# Patient Record
Sex: Male | Born: 1965
Health system: Southern US, Academic
[De-identification: ages and names within clinical notes are randomized; demographics above are authoritative.]

## PROBLEM LIST (undated history)

## (undated) ENCOUNTER — Encounter

## (undated) ENCOUNTER — Telehealth

## (undated) ENCOUNTER — Ambulatory Visit

## (undated) ENCOUNTER — Encounter: Attending: Pharmacist | Primary: Pharmacist

## (undated) ENCOUNTER — Encounter: Attending: Internal Medicine | Primary: Internal Medicine

## (undated) ENCOUNTER — Ambulatory Visit: Payer: MEDICARE

## (undated) ENCOUNTER — Telehealth: Attending: Internal Medicine | Primary: Internal Medicine

## (undated) ENCOUNTER — Ambulatory Visit: Payer: Medicare (Managed Care)

## (undated) ENCOUNTER — Encounter: Attending: Nurse Practitioner | Primary: Nurse Practitioner

## (undated) ENCOUNTER — Telehealth: Attending: Pharmacist | Primary: Pharmacist

## (undated) ENCOUNTER — Ambulatory Visit: Payer: MEDICARE | Attending: Internal Medicine | Primary: Internal Medicine

## (undated) ENCOUNTER — Encounter: Payer: PRIVATE HEALTH INSURANCE | Attending: Internal Medicine | Primary: Internal Medicine

## (undated) ENCOUNTER — Encounter: Attending: Mental Health | Primary: Mental Health

## (undated) ENCOUNTER — Encounter: Attending: Registered" | Primary: Registered"

## (undated) ENCOUNTER — Encounter
Attending: Student in an Organized Health Care Education/Training Program | Primary: Student in an Organized Health Care Education/Training Program

## (undated) ENCOUNTER — Ambulatory Visit: Payer: Medicare (Managed Care) | Attending: Pulmonary Disease | Primary: Pulmonary Disease

## (undated) ENCOUNTER — Telehealth: Attending: Pulmonary Disease | Primary: Pulmonary Disease

## (undated) ENCOUNTER — Ambulatory Visit: Attending: Pharmacist | Primary: Pharmacist

## (undated) ENCOUNTER — Encounter: Attending: Pulmonary Disease | Primary: Pulmonary Disease

## (undated) ENCOUNTER — Ambulatory Visit: Payer: PRIVATE HEALTH INSURANCE

## (undated) ENCOUNTER — Ambulatory Visit: Attending: Nurse Practitioner | Primary: Nurse Practitioner

## (undated) ENCOUNTER — Ambulatory Visit
Payer: PRIVATE HEALTH INSURANCE | Attending: Student in an Organized Health Care Education/Training Program | Primary: Student in an Organized Health Care Education/Training Program

## (undated) ENCOUNTER — Ambulatory Visit: Payer: MEDICARE | Attending: Pharmacist | Primary: Pharmacist

## (undated) ENCOUNTER — Ambulatory Visit: Attending: Addiction (Substance Use Disorder) | Primary: Addiction (Substance Use Disorder)

## (undated) ENCOUNTER — Ambulatory Visit: Attending: Clinical | Primary: Clinical

## (undated) ENCOUNTER — Ambulatory Visit: Payer: PRIVATE HEALTH INSURANCE | Attending: Internal Medicine | Primary: Internal Medicine

## (undated) ENCOUNTER — Non-Acute Institutional Stay: Payer: PRIVATE HEALTH INSURANCE

## (undated) ENCOUNTER — Encounter
Payer: PRIVATE HEALTH INSURANCE | Attending: Student in an Organized Health Care Education/Training Program | Primary: Student in an Organized Health Care Education/Training Program

## (undated) ENCOUNTER — Ambulatory Visit: Payer: MEDICARE | Attending: Retina Specialist | Primary: Retina Specialist

## (undated) ENCOUNTER — Ambulatory Visit: Payer: MEDICARE | Attending: Pulmonary Disease | Primary: Pulmonary Disease

## (undated) ENCOUNTER — Ambulatory Visit: Payer: PRIVATE HEALTH INSURANCE | Attending: Retina Specialist | Primary: Retina Specialist

## (undated) DIAGNOSIS — R51 Headache: Secondary | ICD-10-CM

## (undated) DIAGNOSIS — G8929 Other chronic pain: Secondary | ICD-10-CM

## (undated) DIAGNOSIS — G4733 Obstructive sleep apnea (adult) (pediatric): Secondary | ICD-10-CM

## (undated) DIAGNOSIS — R519 Headache, unspecified: Secondary | ICD-10-CM

## (undated) DIAGNOSIS — E785 Hyperlipidemia, unspecified: Secondary | ICD-10-CM

## (undated) DIAGNOSIS — E8801 Alpha-1-antitrypsin deficiency: Secondary | ICD-10-CM

## (undated) DIAGNOSIS — B450 Pulmonary cryptococcosis: Secondary | ICD-10-CM

## (undated) HISTORY — DX: Headache: R51

## (undated) HISTORY — DX: Alpha-1-antitrypsin deficiency: E88.01

## (undated) HISTORY — DX: Headache, unspecified: R51.9

## (undated) HISTORY — DX: Other chronic pain: G89.29

## (undated) HISTORY — DX: Pulmonary cryptococcosis: B45.0

## (undated) HISTORY — DX: Hyperlipidemia, unspecified: E78.5

## (undated) HISTORY — DX: Obstructive sleep apnea (adult) (pediatric): G47.33

## (undated) MED ORDER — CULTURELLE PROBIOTICS ORAL
Freq: Every day | ORAL | 0.00000 days | Status: SS
Start: ? — End: 2020-12-13

## (undated) NOTE — ED Provider Notes (Signed)
 Formatting of this note is different from the original.  eMERGENCY dEPARTMENT eNCOUnter    CHIEF COMPLAINT   Chief Complaint  Patient presents with  ? Wrist Pain    patient complains of right wrist pain that started 3-4 weeks ago; states that he is unsure of a direct injury and states that it gets better and then returns    HPI   Jake CRASS Sr. is a 75 y.o. male who presents right wrist pain started 3-4 weeks ago states that he injured it states that he has had swelling has not improved.  Denies any other injuries.  Denies any fevers or chills no rashes.  PAST MEDICAL HISTORY   Past Medical History:  Diagnosis Date  ? Alpha-1-antitrypsin deficiency (HCC)   ? Asthma   ? Bronchiectasis (HCC)   ? Diabetes mellitus (HCC)   ? GERD (gastroesophageal reflux disease)   ? Hyperlipidemia   ? Migraine   ? Mycobacterium avium-intracellulare infection (HCC)   ? OSA (obstructive sleep apnea)   ? Reactive airway disease   ? Vitamin D deficiency    SURGICAL HISTORY   History reviewed. No pertinent surgical history.  CURRENT MEDICATIONS   No current facility-administered medications for this encounter.   Current Outpatient Medications:  ?  albuterol  (PROVENTIL ) 2.5 mg /3 mL (0.083 %) nebulizer solution, Take 2.5 mg by nebulization every 6 (six) hours as needed for Wheezing., Disp: , Rfl:  ?  atorvastatin (LIPITOR) 20 MG tablet, Take 20 mg by mouth daily., Disp: , Rfl:  ?  cetirizine  (ZYRTEC ) 10 MG tablet, Take 10 mg by mouth daily., Disp: , Rfl:  ?  dextrose 5 % SolP 50 mL with colistimethate 150 mg SolR, Inject into the vein every 12 (twelve) hours., Disp: , Rfl:  ?  fexofenadine  (ALLEGRA ) 180 MG tablet, Take 180 mg by mouth daily., Disp: , Rfl:  ?  fluticasone  propion-salmeterol (ADVAIR) 100-50 mcg/dose diskus inhaler, Inhale 1 puff into the lungs every 12 (twelve) hours., Disp: , Rfl:  ?  fluticasone  propionate (FLONASE ) 50 mcg/actuation nasal spray, 1 spray by Nasal route daily.Spray  in each nostril., Disp: , Rfl:  ?  metFORMIN (GLUCOPHAGE) 500 MG tablet, Take 500 mg by mouth 2 (two) times daily with meals., Disp: , Rfl:  ?  montelukast  (SINGULAIR ) 10 mg tablet, Take 10 mg by mouth nightly., Disp: , Rfl:  ?  omeprazole (PRILOSEC) 20 MG capsule, Take 20 mg by mouth every morning at 6:30AM (before breakfast)., Disp: , Rfl:  ?  rizatriptan (MAXALT) 10 MG tablet, Take 10 mg by mouth as needed for Migraine.May repeat in 2 hours if unresolved. Do not exceed 30 mg in 24 hours., Disp: , Rfl:  ?  topiramate (TOPAMAX) 100 MG tablet, Take 100 mg by mouth 2 (two) times daily., Disp: , Rfl:   ALLERGIES   Allergies  Allergen Reactions  ? Codeine Palpitations    And rash   ? Daliresp [Roflumilast] Myalgia    Back pain   FAMILY HISTORY   History reviewed. No pertinent family history.  SOCIAL HISTORY   Social History   Socioeconomic History  ? Marital status: Married    Spouse name: None  ? Number of children: None  ? Years of education: None  ? Highest education level: None  Occupational History  ? None  Social Needs  ? Financial resource strain: None  ? Food insecurity:    Worry: None    Inability: None  ? Transportation needs:    Medical:  None    Non-medical: None  Tobacco Use  ? Smoking status: Former Smoker  ? Smokeless tobacco: Never Used  Substance and Sexual Activity  ? Alcohol use: Not Currently  ? Drug use: Never  ? Sexual activity: None  Lifestyle  ? Physical activity:    Days per week: None    Minutes per session: None  ? Stress: None  Relationships  ? Social connections:    Talks on phone: None    Gets together: None    Attends religious service: None    Active member of club or organization: None    Attends meetings of clubs or organizations: None    Relationship status: None  ? Intimate partner violence:    Fear of current or ex partner: None    Emotionally abused: None    Physically abused: None    Forced sexual activity: None  Other  Topics Concern  ? None  Social History Narrative  ? None   REVIEW OF SYSTEMS   All other systems reviewed and are negative  PHYSICAL EXAM   VITAL SIGNS: BP 130/90   Pulse 85   Temp 98.4 F (36.9 C) (Oral)   Resp 16   Ht 5' 6 (1.676 m)   Wt 191 lb (86.6 kg)   SpO2 96%   BMI 30.83 kg/m  Constitutional:  Well developed, well nourished, no acute distress HENT:  Normocephalic, atraumatic.  Normal external inspection. Extremities:  Tenderness palpation over the dorsum of the right wrist and distal forearm mild swelling noted median ulnar radial nerve motor and sensory distributions intact good distal radial pulse 2 +cap refill less than 2 seconds distally no rashes or lesions present Back: No midline or CVA tenderness.  Skin:  No rashes or lesions present Neurologic:  Awake and alert.  GCS 15.  Normal gross cognition and speech.  5/5 strength and normal sensation to light touch.  RADIOLOGY   X-ray wrist right PA lateral and oblique Narrative: THREE OR MORE VIEWS RIGHT WRIST:  COMPARISON: No comparison.  FINDINGS:  No evidence of acute fracture or malalignment.  No significant degenerative change.   No radiopaque foreign body projecting over the soft tissues. Impression: IMPRESSION:  NO EVIDENCE OF ACUTE FRACTURE. IN THE SETTING OF TRAUMA IF THERE IS PERSISTENT CLINICAL CONCERN IN PARTICULAR FOR POSSIBLE SCAPHOID FRACTURE, RECOMMEND REPEAT RADIOGRAPHS IN 7-10 DAYS.  Dictated By: Norleen KATHEE Ottoville III 08/18/2018 10:16  Electronically Signed by: Norleen KATHEE Callender III 08/18/2018 10:17   ED COURSE & MEDICAL DECISION MAKING   Pertinent labs & imaging studies reviewed. (See chart for details) 28 year old male presents with right wrist forearm injury that occurred 3 weeks ago suspect possible ligamentous injury.  X-ray was negative patient neurologically intact here no vascular compromise.  Patient will be referred to Orthopedics and continue wrist brace as needed.  Ice and elevation.   Motrin.  FINAL IMPRESSION   Final diagnoses:  Right wrist injury, initial encounter   Portions of this note may be dictated using Writer. Variances in spelling and vocabulary are possible and unintentional; not all errors are caught/corrected. Please notify dino if any discrepancies are noted or if the meaning of any statement is not clear.    Emmitt JINNY Dixons, MD 08/18/18 1025  Electronically signed by Emmitt JINNY Dixons, MD at 08/18/2018 10:25 AM EDT

---
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
## 2009-11-26 ENCOUNTER — Ambulatory Visit: Payer: Self-pay | Admitting: Family Medicine

## 2010-01-07 ENCOUNTER — Ambulatory Visit: Payer: Self-pay | Admitting: Internal Medicine

## 2010-01-10 ENCOUNTER — Ambulatory Visit: Payer: Self-pay | Admitting: Internal Medicine

## 2010-01-28 LAB — PULMONARY FUNCTION TEST

## 2010-02-04 LAB — PULMONARY FUNCTION TEST

## 2010-02-10 ENCOUNTER — Ambulatory Visit: Payer: Self-pay | Admitting: Internal Medicine

## 2010-03-02 HISTORY — PX: SHOULDER SURGERY: SHX246

## 2010-05-15 ENCOUNTER — Ambulatory Visit: Payer: Self-pay | Admitting: Unknown Physician Specialty

## 2010-05-22 ENCOUNTER — Ambulatory Visit: Payer: Self-pay | Admitting: Unknown Physician Specialty

## 2011-05-08 IMAGING — CT NM PET TUM IMG LTD AREA
5 series · 25 of 25 positions shown · non-contrast
Comparison: none

REASON FOR EXAM: Abn Ct  Diffuse scattered pulmonary opacities  ill
defined  soft tissue nodu...
COMMENTS:

[Series 3: ct wb 3.0 b30f · axial · 3.0mm · 0.98mm/px · z∈[-1379,-511]mm · 10 of 435 slices shown]
[im 1/435  soft-tissue]
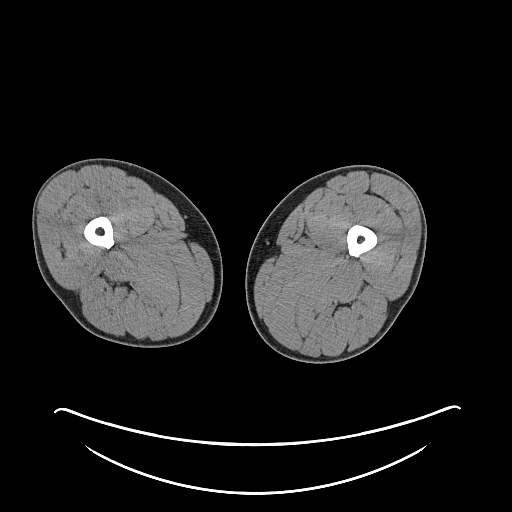
[im 49/435  soft-tissue]
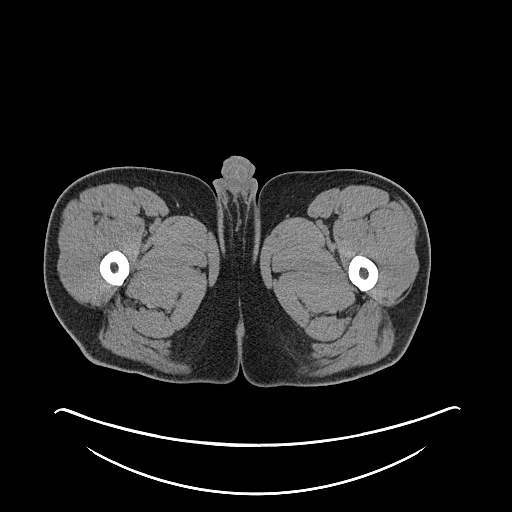
[im 97/435  soft-tissue]
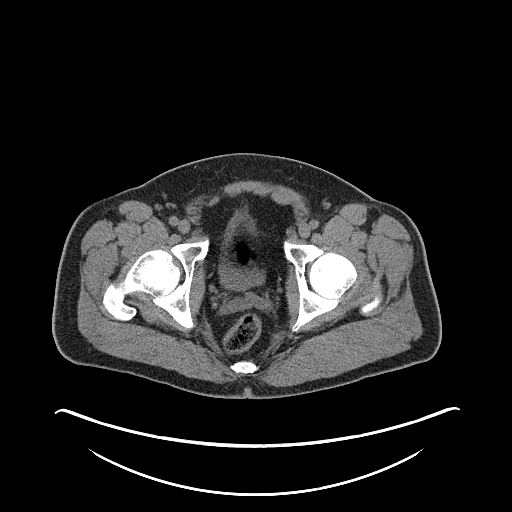
[im 145/435  soft-tissue]
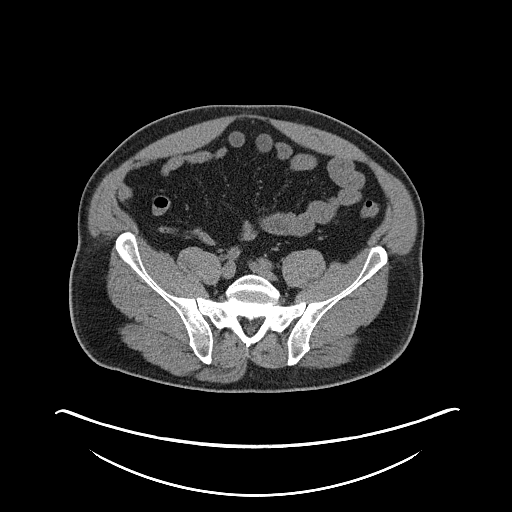
[im 193/435  soft-tissue]
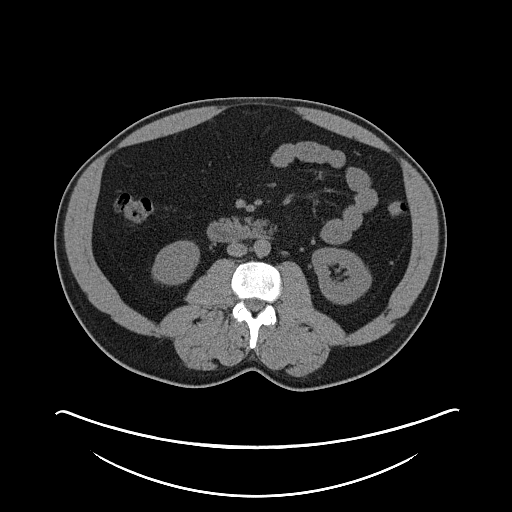
[im 242/435  soft-tissue]
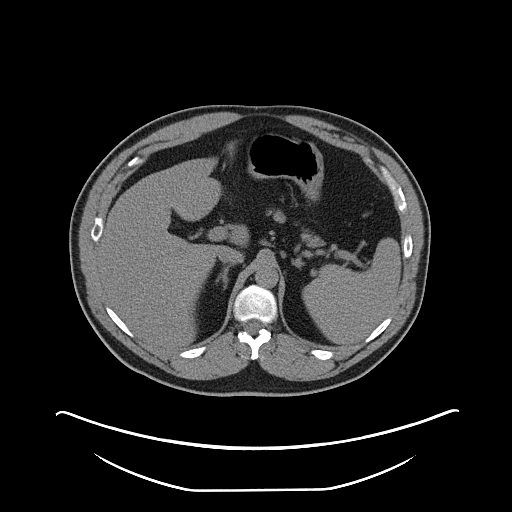
[im 290/435  soft-tissue]
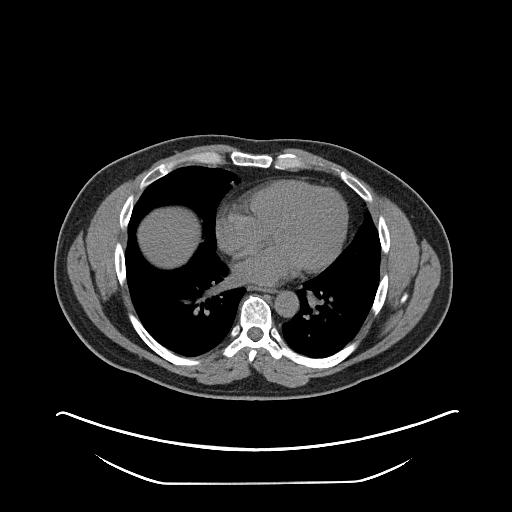
[im 338/435  soft-tissue]
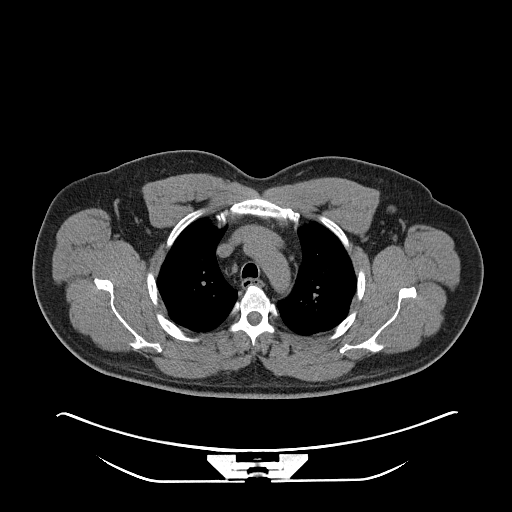
[im 386/435  soft-tissue]
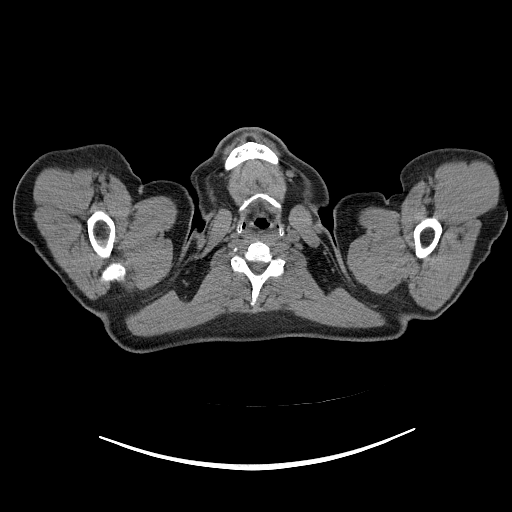
[im 435/435  soft-tissue]
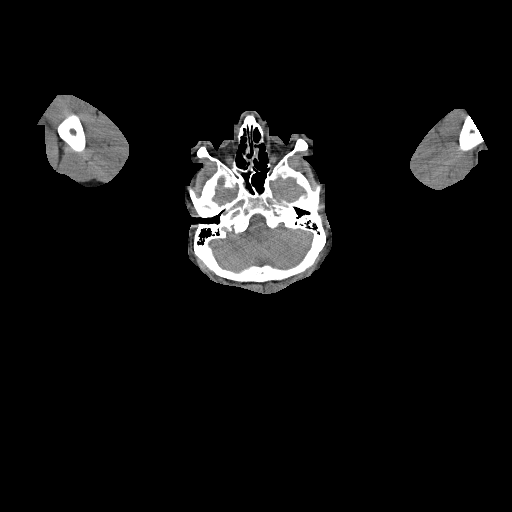

[Series 102: pet wb · axial · 5.0mm · 4.07mm/px · z∈[-1378,-511]mm · 7 of 290 slices shown]
[im 1/290]
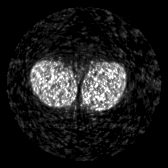
[im 49/290]
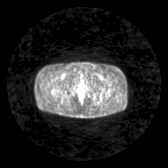
[im 97/290]
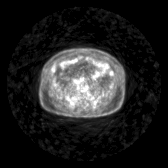
[im 145/290]
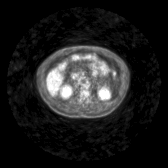
[im 193/290]
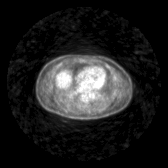
[im 241/290]
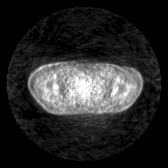
[im 290/290]
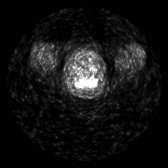

[Series 803: pet axial · 4 of 170 slices shown]
[im 1/170]
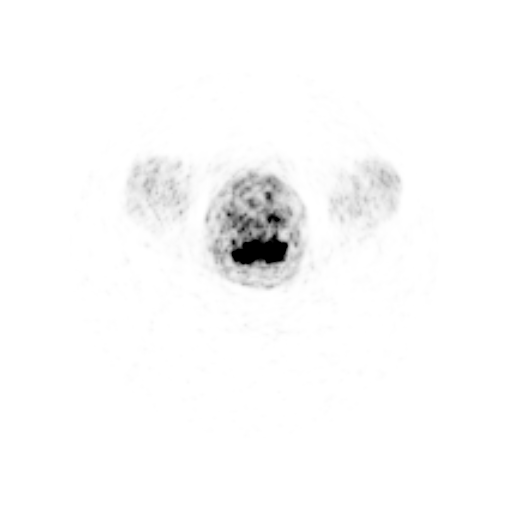
[im 57/170]
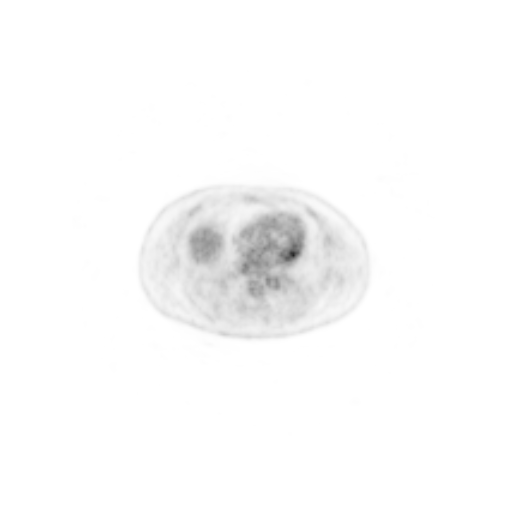
[im 113/170]
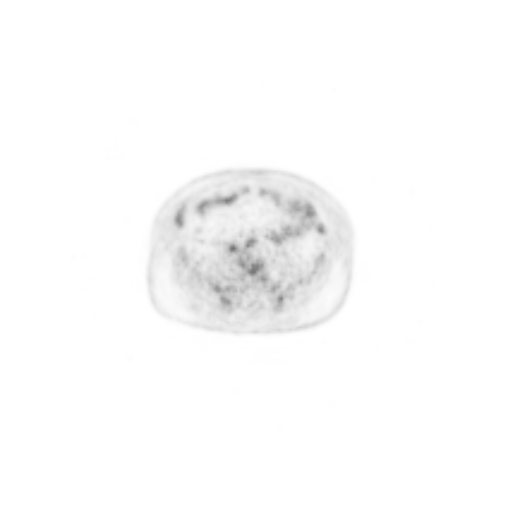
[im 170/170]
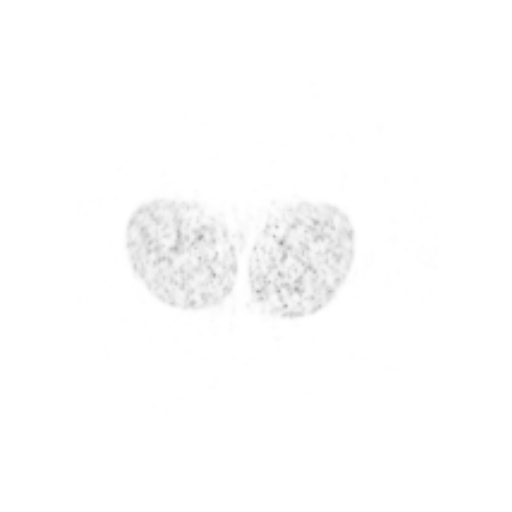

[Series 804: pet coronal · 2 of 65 slices shown]
[im 1/65]
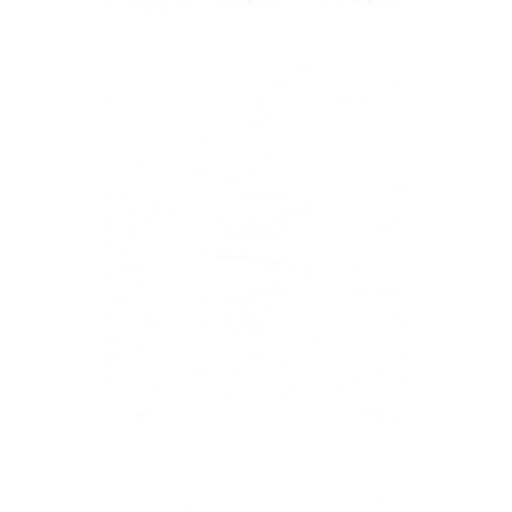
[im 65/65]
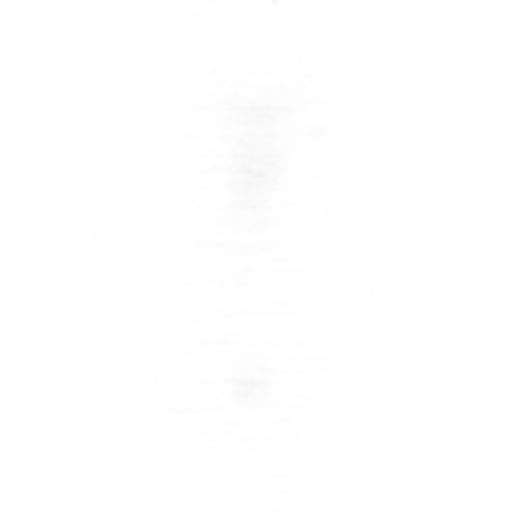

[Series 805: pet sagittal · 2 of 92 slices shown]
[im 1/92]
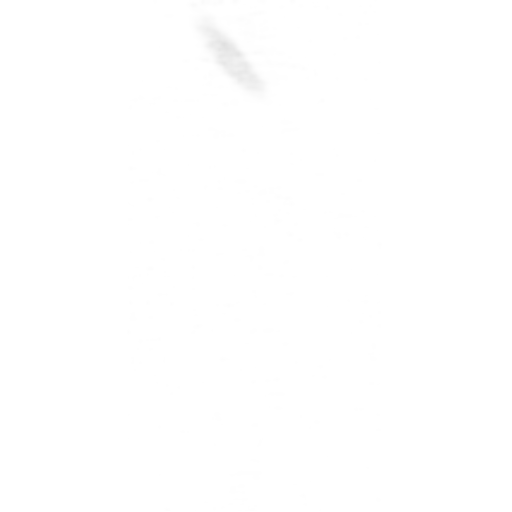
[im 92/92]
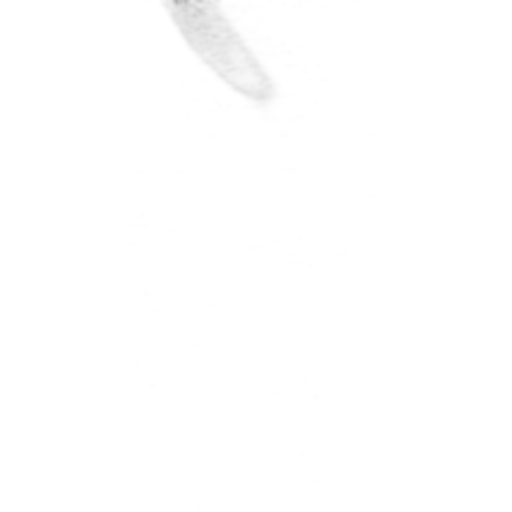

[25 of 25 positions shown; findings below may reference images not displayed]

PROCEDURE:     PET - PET/CT DX LUNG CA  - February 10, 2010  [DATE]

RESULT:     The patient's fasting blood glucose level is measured at 95
mg/dl. The patient received an injection of 13.30 mCi of fluorine-55 labeled
fluorodeoxyglucose in the right antecubital region at [DATE] p.m. with
imaging obtained between the hours of [DATE] p.m. and [DATE] p.m. extending from
the base of the brain into the thighs. Non-contrast CT is performed over the
same regions for the purposes of attenuation, correction and fusion. The
non-contrast CT images, attenuation corrected PET images and fused PET/CT
data sets are reconstructed by the [employer] software in the axial, coronal
and sagittal planes. There is no previous similar study for comparison.

There appears to be a mucous retention cyst in the floor of the left
maxillary sinus. There is no abnormal localization of F-18 FDG to suggest
F-18 FDG avid malignancy. No discrete pulmonary mass is seen. There is some
dependent atelectasis.
IMPRESSION: No F-18 FDG avid malignancy evident. Given the findings on
the previous diagnostic chest CT, follow-up chest CT would be suggested in 3
to 6 months.

## 2011-06-12 LAB — PULMONARY FUNCTION TEST

## 2011-08-03 ENCOUNTER — Institutional Professional Consult (permissible substitution): Payer: Self-pay | Admitting: Emergency Medicine

## 2011-08-10 ENCOUNTER — Ambulatory Visit (INDEPENDENT_AMBULATORY_CARE_PROVIDER_SITE_OTHER): Payer: Self-pay | Admitting: Pulmonary Disease

## 2011-08-10 ENCOUNTER — Encounter: Payer: Self-pay | Admitting: Pulmonary Disease

## 2011-08-10 DIAGNOSIS — J479 Bronchiectasis, uncomplicated: Secondary | ICD-10-CM

## 2011-08-10 DIAGNOSIS — G4733 Obstructive sleep apnea (adult) (pediatric): Secondary | ICD-10-CM

## 2011-08-10 DIAGNOSIS — E8801 Alpha-1-antitrypsin deficiency: Secondary | ICD-10-CM | POA: Insufficient documentation

## 2011-08-10 NOTE — Patient Instructions (Signed)
Return to clinic in 6 weeks. We will obtain records from [facility].

## 2011-08-10 NOTE — Progress Notes (Signed)
Subjective:    Patient ID: Jake Martinez, male    DOB: 02/01/1966, 45 y.o.   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  He states that he was healthy as a child and had very few respiratory infections.  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  Approximately two years ago his symptoms recurred, primarily continuous daily cough productive of copious amounts of sputum.  He underwent evaluation for this by Dr. Welton Flakes here in [place] and was found to have nodules in his lungs and obstructive sleep apnea.  He was put on CPAP therapy which he uses regularly.  He states that after his initial evaluation in pulmonary medicine he was asked to change his work environment, but unfortunately he was terminated by his company at this request.  Since then he has been evaluated extensively by [facility] and says that he has been diagnosed with alpha 1 anti-trypsin deficiency and bronchiectasis.  He has not required hospitalization for this, but he has been given Dulera, hypertonic saline, and albuterol.  He says that the [facility] helps his shortness of breath somewhat. He states that initially in the illness he did not have dyspnea, but this symptom has slowly progressed over the last few years.  He is scheduled to undergo what sounds like an esophageal impedence study or pH probe at [facility] on 09/02/2011.    Review of Systems  Constitutional: Negative for fever, chills, activity change, appetite change and unexpected weight change.  HENT: Positive for congestion. Negative for sore throat, rhinorrhea, sneezing, trouble swallowing, dental problem, voice change and postnasal drip.   Eyes: Negative for visual  disturbance.  Respiratory: Positive for cough and shortness of breath. Negative for choking.   Cardiovascular: Positive for chest pain. Negative for leg swelling.  Gastrointestinal: Negative for nausea, vomiting and abdominal pain.  Genitourinary: Negative for difficulty urinating.  Musculoskeletal: Negative for arthralgias.  Skin: Negative for rash.  Psychiatric/Behavioral: Negative for behavioral problems and confusion.       Objective:   Physical Exam Gen: well appearing, no acute distress HEENT: NCAT, PERRL, EOMi, OP clear, neck supple without masses PULM: Insp crackles worse in bases, L>R GEN: RRR, no mgr, no JVD AB: BS+, soft, nontender, no hsm Ext: warm, no edema, no clubbing, no cyanosis Derm: no rash or skin breakdown Neuro: A&Ox4, CN II-XII intact, strength 5/5 in all 4 extremeties       Assessment & Plan:   Impression: 1) Bronchiectasis 2) Alpha-1-anti-trypsin deficiency  A&P:  45 y/o male with a diagnosis of bronchiectasis and alpha-1-antitrypsin deficiency presents to our clinic for a second opinion on his diagnosis and management.  It sounds as if he has had an extensive work up both here in [place] by Dr. Welton Flakes and in [place] by [facility].  Alpha 1 anti-trypsin deficiency can be associated with bronchiectasis so there are no obvious inconsistencies in the history he provides to compel me to order more tests.  At this point, I see no indication to change his current therapy as it seems appropriate for his given diagnoses.  We will request records from [facility] from clinic visits, lab work, and radiology images to better asses his case.  We will have him return in  6 weeks after his esophageal study and will re-assess his situation at that point.  In the meantime I have recommended that he continue on his current CPAP therapy and medication regimen which was reconciled today.

## 2011-08-19 ENCOUNTER — Telehealth: Payer: Self-pay | Admitting: Pulmonary Disease

## 2011-08-19 NOTE — Telephone Encounter (Signed)
Received copies from [facility] 08/19/2011. Forwarded  35pages to Dr. Elba Barman review.

## 2011-08-24 ENCOUNTER — Telehealth: Payer: Self-pay | Admitting: Pulmonary Disease

## 2011-08-24 NOTE — Telephone Encounter (Signed)
I spoke with Jake Martinez and advised her per Verlon Au she did receive records from [facility]. Nothing further was needed

## 2011-08-27 ENCOUNTER — Telehealth: Payer: Self-pay | Admitting: Pulmonary Disease

## 2011-08-27 NOTE — Telephone Encounter (Signed)
Spoke with pt. He states that his original appt was scheduled for 09/28/11 and that his spouse had called and rescheduled this sooner due to increased cough. He states that he feels okay with waiting until after PH study done to be seen, but since he is worsening I will forward msg to Dr Kendrick Fries for recs. Please advise if you want to have him keep his ov with 10/29 or have his reschedule for later that wk, thanks!

## 2011-08-27 NOTE — Telephone Encounter (Signed)
Pt is scheduled to see Dr Kendrick Fries in [facility] on 08/31/11, however we had planned his return to be after had esophageal PH study done at [facility]. This was to be done on 09/01/11. I have call the pt to see if he can resched, or if maybe he was able to have the study done sooner and that way he can just keep ov. Will await a call back.

## 2011-08-28 NOTE — Telephone Encounter (Signed)
Spoke with pt. He states that today his cough is no worse than when last seen by [place]. He states that he feels comfortable waiting until after PH study done and then will see [place] in 09/04/11 at 3:30. He denies any fever, SOB, CP or other complaints. I advised that if cough gets worse and other symptoms arise to call us for sooner ov or seek emergency care if needed. Pt verbalized understanding and states that he will bring results of PH study to appt.

## 2011-08-30 NOTE — Telephone Encounter (Signed)
Jake Martinez,  If it would help tell [name] that I can call him on 10/29 (to help with the cough) in the afternoon then he can come in to the office later in the week. Thanks, Kipp Brood

## 2011-08-30 NOTE — Telephone Encounter (Signed)
If he wants to keep the 10/29 appointment for cough that is fine, though I would prefer to see him on Nov 1 or Nov 2 as he will have the pH probe on 10/31.    He doesn't have insurance, so minimizing his appointments is probably best for him financially... So it would really make the most sense for him to come on Nov 1 or 2.

## 2011-08-31 ENCOUNTER — Ambulatory Visit: Payer: Self-pay | Admitting: Pulmonary Disease

## 2011-09-01 ENCOUNTER — Institutional Professional Consult (permissible substitution): Payer: Self-pay | Admitting: Emergency Medicine

## 2011-09-04 ENCOUNTER — Ambulatory Visit: Payer: Self-pay | Admitting: Pulmonary Disease

## 2011-09-15 LAB — PULMONARY FUNCTION TEST

## 2011-09-23 ENCOUNTER — Emergency Department (HOSPITAL_COMMUNITY)
Admission: EM | Admit: 2011-09-23 | Discharge: 2011-09-24 | Disposition: A | Discharge status: Home / Self Care | Payer: Self-pay | Attending: Emergency Medicine | Admitting: Emergency Medicine

## 2011-09-23 ENCOUNTER — Emergency Department (HOSPITAL_COMMUNITY): Payer: Self-pay

## 2011-09-23 ENCOUNTER — Encounter (HOSPITAL_COMMUNITY): Payer: Self-pay | Admitting: Emergency Medicine

## 2011-09-23 ENCOUNTER — Other Ambulatory Visit: Payer: Self-pay

## 2011-09-23 DIAGNOSIS — Z79899 Other long term (current) drug therapy: Secondary | ICD-10-CM | POA: Insufficient documentation

## 2011-09-23 DIAGNOSIS — M79603 Pain in arm, unspecified: Secondary | ICD-10-CM

## 2011-09-23 DIAGNOSIS — G4733 Obstructive sleep apnea (adult) (pediatric): Secondary | ICD-10-CM | POA: Insufficient documentation

## 2011-09-23 DIAGNOSIS — R079 Chest pain, unspecified: Secondary | ICD-10-CM | POA: Insufficient documentation

## 2011-09-23 DIAGNOSIS — E785 Hyperlipidemia, unspecified: Secondary | ICD-10-CM | POA: Insufficient documentation

## 2011-09-23 DIAGNOSIS — M79609 Pain in unspecified limb: Secondary | ICD-10-CM | POA: Insufficient documentation

## 2011-09-23 DIAGNOSIS — M542 Cervicalgia: Secondary | ICD-10-CM | POA: Insufficient documentation

## 2011-09-23 DIAGNOSIS — M25519 Pain in unspecified shoulder: Secondary | ICD-10-CM | POA: Insufficient documentation

## 2011-09-23 LAB — CBC
HCT: 45.6 % (ref 39.0–52.0)
MCV: 86.7 fL (ref 78.0–100.0)
RBC: 5.26 MIL/uL (ref 4.22–5.81)
WBC: 9.6 10*3/uL (ref 4.0–10.5)

## 2011-09-23 LAB — BASIC METABOLIC PANEL
BUN: 17 mg/dL (ref 6–23)
CO2: 25 mEq/L (ref 19–32)
Chloride: 103 mEq/L (ref 96–112)
Creatinine, Ser: 0.9 mg/dL (ref 0.50–1.35)

## 2011-09-23 LAB — POCT I-STAT TROPONIN I: Troponin i, poc: 0 ng/mL (ref 0.00–0.08)

## 2011-09-23 NOTE — ED Notes (Signed)
PT. REPORTS LEFT CHEST PAIN RADIATING TO LEFT SHOULDER AND LEFT ARM ONSET THIS MORNING ,  NO SOB , NO NAUSEA OR DIAPHORESIS.  CURRENTLY TAKING ANTIBIOTICS FOR "LUNG INFECTION".

## 2011-09-24 ENCOUNTER — Other Ambulatory Visit: Payer: Self-pay

## 2011-09-24 ENCOUNTER — Encounter (HOSPITAL_COMMUNITY): Payer: Self-pay | Admitting: *Deleted

## 2011-09-24 LAB — POCT I-STAT TROPONIN I: Troponin i, poc: 0 ng/mL (ref 0.00–0.08)

## 2011-09-24 NOTE — ED Notes (Signed)
Peter, PA at bedside.

## 2011-09-24 NOTE — ED Notes (Signed)
Received bedside report from Jake Martinez, [place].  Patient currently sitting up in bed; no respiratory or acute distress noted.  Lab at bedside collecting Troponin.  Patient has no questions or concerns at this time.  Will continue to monitor.

## 2011-09-24 NOTE — ED Notes (Signed)
Patient given copy of discharge papers.  Went over discharge instructions with patient.  Instructed patient to follow up with primary care physicians and to take ibuprofen for Tylenol for pain.  Patient instructed to return to the ED for new, concerning, or worsening symptoms.  Will continue to monitor.

## 2011-09-24 NOTE — ED Provider Notes (Signed)
History     CSN: [id number] Arrival date & time: 09/23/2011  9:38 PM   First MD Initiated Contact with Patient 09/24/11 0151      Chief Complaint  Patient presents with  . Chest Pain    HPI  History provided by the patient and family. Patient presents with complaints of left arm and shoulder pains radiating to left neck that began earlier yesterday morning at around 10 AM. Patient states he first noticed pain in his arm when he lifted it on the top of the steering wheel while he was driving. Pain seemed to be better when he lowered the arm. Since that time pain has been more persistent and seems worse with activity. Pt also reports that the pain seems to have sharp burst lasting 30 seconds in the area. Patient denies pain over his chest to me but does report some tightness.  Pt denies diaphoresis, nausea or vomiting.   Past Medical History  Diagnosis Date  . Alpha-1-antitrypsin deficiency   . Bronchiectasis   . Hyperlipidemia   . Chronic headaches   . OSA (obstructive sleep apnea)     Past Surgical History  Procedure Date  . Shoulder surgery 03/02/10    Family History  Problem Relation Age of Onset  . Alpha-1 antitrypsin deficiency      ? which parent   . Emphysema Maternal Uncle     smoker  . Colon cancer Father   . Heart disease Maternal Uncle     History  Substance Use Topics  . Smoking status: Never Smoker   . Smokeless tobacco: Former Neurosurgeon    Quit date: 11/02/1997  . Alcohol Use: No      Review of Systems  Constitutional: Negative for fever and chills.  Respiratory: Negative for cough.   Cardiovascular: Positive for chest pain. Negative for palpitations.  Gastrointestinal: Negative for nausea, vomiting, abdominal pain, diarrhea and constipation.  Skin: Negative for rash.  All other systems reviewed and are negative.    Allergies  Codeine  Home Medications   Current Outpatient Rx  Name Route Sig Dispense Refill  . ALBUTEROL SULFATE HFA 108 (90  BASE) MCG/ACT IN AERS Inhalation Inhale 2 puffs into the lungs every 6 (six) hours as needed.      . ALBUTEROL SULFATE (2.5 MG/3ML) 0.083% IN NEBU  1 vial in nebulizer three times daily with sodium chloride     . VITAMIN D 2000 UNITS PO CAPS Oral Take 1 capsule by mouth daily.      Marland Kitchen FLUTICASONE PROPIONATE 50 MCG/ACT NA SUSP Nasal Place 2 sprays into the nose daily.      Marland Kitchen LEVOFLOXACIN 750 MG PO TABS Oral Take 750 mg by mouth daily. Started 11/20     . MOMETASONE FURO-FORMOTEROL FUM 200-5 MCG/ACT IN AERO Inhalation Inhale 2 puffs into the lungs 2 (two) times daily.      . NYSTATIN 100000 UNIT/ML MT SUSP Oral Take 5 mLs by mouth 4 (four) times daily.      . SODIUM CHLORIDE 3 % IN NEBU Nebulization Take 5 mLs by nebulization 3 (three) times daily.      Jannetta Quint SALINE NASAL NETI RINSE NA  Use twice per day     . TOBRAMYCIN 300 MG/5ML IN NEBU Nebulization Take 300 mg by nebulization 2 (two) times daily. Takes twice daily every other month       Triage VS: BP 126/77  Pulse 89  Temp(Src) 98.2 F (36.8 C) (Oral)  Resp 20  SpO2 96%  Physical Exam  Nursing note and vitals reviewed. Constitutional: He is oriented to person, place, and time. He appears well-developed and well-nourished. No distress.  HENT:  Head: Normocephalic and atraumatic.  Cardiovascular: Normal rate and regular rhythm.   No murmur heard. Pulmonary/Chest: Effort normal and breath sounds normal. He has no wheezes. He has no rales. He exhibits no tenderness.  Abdominal: Soft. There is no tenderness.  Musculoskeletal: Normal range of motion. He exhibits no edema and no tenderness.  Neurological: He is alert and oriented to person, place, and time.  Skin: Skin is warm. No rash noted.  Psychiatric: He has a normal mood and affect.     ED Course  Procedures (including critical care time)  Labs Reviewed  BASIC METABOLIC PANEL - Abnormal; Notable for the following:    Glucose, Bld 111 (*)    All other components within normal  limits  CBC  POCT I-STAT TROPONIN I  POCT I-STAT TROPONIN I  LAB REPORT - SCANNED    Results for orders placed during the hospital encounter of 09/23/11  CBC      Component Value Range   WBC 9.6  4.0 - 10.5 (K/uL)   RBC 5.26  4.22 - 5.81 (MIL/uL)   Hemoglobin 15.8  13.0 - 17.0 (g/dL)   HCT 45.4  09.8 - 11.9 (%)   MCV 86.7  78.0 - 100.0 (fL)   MCH 30.0  26.0 - 34.0 (pg)   MCHC 34.6  30.0 - 36.0 (g/dL)   RDW 14.7  82.9 - 56.2 (%)   Platelets 227  150 - 400 (K/uL)  BASIC METABOLIC PANEL      Component Value Range   Sodium 138  135 - 145 (mEq/L)   Potassium 3.6  3.5 - 5.1 (mEq/L)   Chloride 103  96 - 112 (mEq/L)   CO2 25  19 - 32 (mEq/L)   Glucose, Bld 111 (*) 70 - 99 (mg/dL)   BUN 17  6 - 23 (mg/dL)   Creatinine, Ser 1.30  0.50 - 1.35 (mg/dL)   Calcium 9.8  8.4 - 86.5 (mg/dL)   GFR calc non Af Amer >90  >90 (mL/min)   GFR calc Af Amer >90  >90 (mL/min)  POCT I-STAT TROPONIN I      Component Value Range   Troponin i, poc 0.00  0.00 - 0.08 (ng/mL)   Comment 3           POCT I-STAT TROPONIN I      Component Value Range   Troponin i, poc 0.00  0.00 - 0.08 (ng/mL)   Comment 3              Dg Chest 2 View  09/23/2011  *RADIOLOGY REPORT*  Clinical Data: Shortness of breath.  Right flank pain and right- sided chest pain.  CHEST - 2 VIEW 09/23/2011:  Comparison: None.  Findings: Cardiomediastinal silhouette unremarkable.  Linear atelectasis or scarring deep in the right lower lobe.  Streaky opacities in the posteromedial left lower lobe.  Lungs otherwise clear.  No pleural effusions.  Mild degenerative changes involving the thoracic spine.  IMPRESSION: Linear atelectasis or scarring deep in the right lower lobe. Streaky atelectasis versus bronchopneumonia versus bronchiectasis in the left lower lobe.  Original Report Authenticated By: Arnell Sieving, M.D.     1. Upper extremity pain   2. Neck pain       MDM  Patient seen and evaluated. Patient in no acute  distress.  Patient discussed with attending physician. Patient's symptoms atypical, he has normal ECG x2 and troponin x2. Patient also without significant findings on chest x-ray. At this time will have patient return home and follow with PCP and specialist as planned.   ECG  Date: 09/24/2011 21:40  Rate: 78  Rhythm: normal sinus rhythm and sinus arrhythmia  QRS Axis: normal  Intervals: normal  ST/T Wave abnormalities: normal  Conduction Disutrbances:none  Narrative Interpretation:   Old EKG Reviewed: none available   ECG  Date: 09/24/2011 3:55 AM  Rate: 73  Rhythm: normal sinus rhythm  QRS Axis: normal  Intervals: normal  ST/T Wave abnormalities: normal  Conduction Disutrbances:none  Narrative Interpretation:   Old EKG Reviewed: unchanged         Angus Seller, PA 09/24/11 [phone]

## 2011-09-24 NOTE — ED Provider Notes (Signed)
Medical screening examination/treatment/procedure(s) were performed by non-physician practitioner and as supervising physician I was immediately available for consultation/collaboration.   Vida Roller, MD 09/24/11 1020

## 2011-09-28 ENCOUNTER — Ambulatory Visit (INDEPENDENT_AMBULATORY_CARE_PROVIDER_SITE_OTHER): Payer: Self-pay | Admitting: Pulmonary Disease

## 2011-09-28 ENCOUNTER — Encounter: Payer: Self-pay | Admitting: Pulmonary Disease

## 2011-09-28 DIAGNOSIS — G4733 Obstructive sleep apnea (adult) (pediatric): Secondary | ICD-10-CM

## 2011-09-28 DIAGNOSIS — E8801 Alpha-1-antitrypsin deficiency: Secondary | ICD-10-CM

## 2011-09-28 DIAGNOSIS — J329 Chronic sinusitis, unspecified: Secondary | ICD-10-CM

## 2011-09-28 DIAGNOSIS — J479 Bronchiectasis, uncomplicated: Secondary | ICD-10-CM

## 2011-09-28 NOTE — Assessment & Plan Note (Signed)
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

## 2011-09-28 NOTE — Progress Notes (Signed)
Subjective:    Patient ID: Jake Martinez, male    DOB: [birth year], 45 y.o.   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  See prior notes for further history.  09/28/11 ROV 2 month f/u -- since our last visit he underwent a esophageal pH probe but doesn't know the results.  He states that his cough was increasing in frequency and associated with dark sputum so his pulmonologist at [facility] put him on Levaquin and inhaled tobi in addition to increasing his hypertonic to 7%.  He had chest pain recently and a negative stress test, he is to follow up at [facility] cardiology.  He feels that his cough is improved as it is thinner and clearer now after one week of antibiotics.       Objective:   Physical Exam Filed Vitals:   09/28/11 1351  BP: 118/60  Pulse: 90  Temp: 98.2 F (36.8 C)  TempSrc: Oral  Height: 5\' 5"  (1.651 m)  Weight: 204 lb (92.534 kg)  SpO2: 94%    Gen: well appearing, no acute distress HEENT: NCAT, PERRL, EOMi, OP clear, neck supple without masses PULM: Insp crackles worse in R base GEN: RRR, no mgr, no JVD AB: BS+, soft, nontender, no hsm Ext: warm, no edema, no clubbing, no cyanosis Derm: no rash or skin breakdown Neuro: A&Ox4, CN II-XII intact, strength 5/5 in all 4 extremeties   Multiple records from [facility] were reviewed including the most recent pulmonary notes, pft's and sleep study.    Assessment & Plan:   Impression: 1) Bronchiectasis 2) Alpha-1-anti-trypsin deficiency 3) Chronic sinusitis  Bronchiectasis Again he returns for a second opinion of the the work up and management coordinated at [facility].  To date he has had an extensive work up and the only piece of the puzzle we await is the pH probe results.  I have assured him that I completely agree with the work up, chronic and acute management of his [facility] provider today.  Specifically, I explained to him the importance  of the maintenance regimen (hypertonic, tobi, flutter valve, albuterol) and the need for periodic tune ups.  He is improving well in the midst of a current tune-up with levaquin.  I advised him that I am really not adding much and that he could probably see just one pulmonologist from here out, and that it should be [facility] as they are coordinating his care. He understood.    He will forward [place] the results of the pH probe.  I advised him to call for an appointment as needed.  Alpha-1-antitrypsin deficiency Heterozygote, no indication for replacement therapy.  Chronic sinusitis Advised increase frequency of saline rinses  Obstructive sleep apnea Continue home CPAP    Updated Medication List Outpatient Encounter Prescriptions as of 09/28/2011  Medication Sig Dispense Refill  . albuterol (PROVENTIL HFA) 108 (90 BASE) MCG/ACT inhaler Inhale 2 puffs into the lungs every 6 (six) hours as needed.        Marland Kitchen albuterol (PROVENTIL) (2.5 MG/3ML) 0.083% nebulizer solution 1 vial in nebulizer three times daily with sodium chloride       . Cholecalciferol (VITAMIN D) 2000 UNITS CAPS Take 1 capsule by mouth daily.        . fluticasone (FLONASE) 50 MCG/ACT nasal spray Place 2 sprays into the nose daily.        Marland Kitchen levofloxacin (LEVAQUIN) 750 MG tablet Take 750 mg by mouth daily.  Started 11/20       . Mometasone Furo-Formoterol Fum (DULERA) 200-5 MCG/ACT AERO Inhale 2 puffs into the lungs 2 (two) times daily.        Marland Kitchen nystatin (MYCOSTATIN) 100000 UNIT/ML suspension Take 5 mLs by mouth 4 (four) times daily.        . Sodium Chloride, Inhalant, 7 % NEBU Inhale 1 vial into the lungs 3 (three) times daily. With albuterol       . Sodium Chloride-Sodium Bicarb (AYR SALINE NASAL NETI RINSE NA) Use twice per day       . tobramycin, PF, (TOBI) 300 MG/5ML nebulizer solution Take 300 mg by nebulization 2 (two) times daily. Takes twice daily every other month       . DISCONTD: sodium chloride HYPERTONIC 3 % nebulizer  solution Take 5 mLs by nebulization 3 (three) times daily.

## 2011-09-28 NOTE — Patient Instructions (Signed)
Continue using the tobi, hypertonic saline, and albuterol prescribed by [facility]. Continue taking the Levaquin as ordered. Follow up with the cardiologist as ordered. Forward the results of your pH probe to [place]. Use your saline rinses every 3-4 hours as needed. Call us for follow up visit as needed.

## 2011-09-28 NOTE — Assessment & Plan Note (Signed)
Again he returns for a second opinion of the the work up and management coordinated at [facility].  To date he has had an extensive work up and the only piece of the puzzle we await is the pH probe results.  I have assured him that I completely agree with the work up, chronic and acute management of his [facility] provider today.  Specifically, I explained to him the importance of the maintenance regimen (hypertonic, tobi, flutter valve, albuterol) and the need for periodic tune ups.  He is improving well in the midst of a current tune-up with levaquin.  I advised him that I am really not adding much and that he could probably see just one pulmonologist from here out, and that it should be [facility] as they are coordinating his care. He understood.    He will forward [place] the results of the pH probe.  I advised him to call for an appointment as needed.

## 2011-09-28 NOTE — Assessment & Plan Note (Signed)
Continue home CPAP ?

## 2011-09-28 NOTE — Assessment & Plan Note (Signed)
Advised increase frequency of saline rinses

## 2013-01-31 DIAGNOSIS — B450 Pulmonary cryptococcosis: Secondary | ICD-10-CM

## 2013-01-31 HISTORY — DX: Pulmonary cryptococcosis: B45.0

## 2014-08-16 ENCOUNTER — Ambulatory Visit (INDEPENDENT_AMBULATORY_CARE_PROVIDER_SITE_OTHER): Payer: Self-pay | Admitting: Pulmonary Disease

## 2014-08-16 ENCOUNTER — Encounter: Payer: Self-pay | Admitting: Pulmonary Disease

## 2014-08-16 ENCOUNTER — Ambulatory Visit: Payer: Self-pay | Admitting: Pulmonary Disease

## 2014-08-16 ENCOUNTER — Ambulatory Visit (INDEPENDENT_AMBULATORY_CARE_PROVIDER_SITE_OTHER)
Admission: RE | Admit: 2014-08-16 | Discharge: 2014-08-16 | Disposition: A | Discharge status: Home / Self Care | Payer: Self-pay | Source: Ambulatory Visit | Attending: Pulmonary Disease | Admitting: Pulmonary Disease

## 2014-08-16 ENCOUNTER — Encounter (INDEPENDENT_AMBULATORY_CARE_PROVIDER_SITE_OTHER): Payer: Self-pay

## 2014-08-16 ENCOUNTER — Telehealth: Payer: Self-pay | Admitting: Pulmonary Disease

## 2014-08-16 ENCOUNTER — Other Ambulatory Visit: Payer: Self-pay

## 2014-08-16 VITALS — BP 128/72 | HR 69 | Ht 67.0 in | Wt 198.0 lb

## 2014-08-16 DIAGNOSIS — J479 Bronchiectasis, uncomplicated: Secondary | ICD-10-CM

## 2014-08-16 DIAGNOSIS — R0602 Shortness of breath: Secondary | ICD-10-CM

## 2014-08-16 DIAGNOSIS — R071 Chest pain on breathing: Secondary | ICD-10-CM

## 2014-08-16 DIAGNOSIS — E8801 Alpha-1-antitrypsin deficiency: Secondary | ICD-10-CM

## 2014-08-16 DIAGNOSIS — R079 Chest pain, unspecified: Secondary | ICD-10-CM | POA: Insufficient documentation

## 2014-08-16 LAB — D-DIMER, QUANTITATIVE (NOT AT ARMC): D DIMER QUANT: 0.27 ug{FEU}/mL (ref 0.00–0.48)

## 2014-08-16 NOTE — Assessment & Plan Note (Signed)
[name] is an alpha-1 carrier who has mild bronchiectasis seen on CT scanning. He has been followed by our clinic over 3 years ago but in the interim he has been followed by [facility]. He has mild airflow obstruction based on the most recent pulmonary function testing in 2012.  Currently, he is without exacerbation. In terms of his maintenance treatment for bronchiectasis it is as follows: -Mucociliary clearance with hypertonic saline 3 times a day and flutter valve 3 times a day, albuterol 3 times a day, Symbicort twice a day -For antibiotics he uses inhaled amikacin every other month -His nutritional status is adequate as he is overweight  The only thing that I could consider adding would be azithromycin 3 times a week, we will address that on the next visit.   Plan: -Continue Symbicort, hypertonic saline, flutter valve, albuterol -Continue every other month inhaled amikacin -Consider adding daily azithromycin or 3 times a week azithromycin -Sputum culture for AFB, fungal, bacterial -Repeat pulmonary function testing -Followup 4 months in [place]

## 2014-08-16 NOTE — Telephone Encounter (Signed)
Called and spoke to [facility] - Patient assistance.  Pt is eligible for Patient assistance program until 05/2015 provided that he has not obtained Medicaid or other insurance.  Should patient obtain other insurance, pt must call them and let them know.  Pt will still be able to receive his medications through [facility], but with insurance/Medicaid, he will no longer be eligible for Patient assistance.  Attempted to contact patient to advise,no answer, will call back later today

## 2014-08-16 NOTE — Assessment & Plan Note (Signed)
He is a carrier but is not alpha-1 deficient. He is very focused on this and thinks that he is ultimately going to need a lung transplant. I advised him today that he does not have alpha-1 antitrypsin homozygous deficiency and he does not need replacement. Further, with mild airflow obstruction he does not need to worry about the possibility of a lung transplant anytime soon.

## 2014-08-16 NOTE — Telephone Encounter (Signed)
Patient notified. Encounter complete.

## 2014-08-16 NOTE — Progress Notes (Signed)
Subjective:    Patient ID: Jake Martinez, male    DOB: 11/22/1965, 48 y.o.   MRN: [id number]  Synopsis: Mr. Jake Martinez is an alpha-1 carrier who has mild bronchiectasis in the bases of his lungs. He was first evaluated by the [facility] pulmonary clinic in 2012. He then [facility] with [facility] for the following 3 years. During that time he continued to have evidence of only mild airflow obstruction on pulmonary function testing. 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 He was treated with one year with fluconazole. He was followed by infectious diseases as well as pulmonary at [facility]. For some reason Roflumilast was attempted during that time and he was quite intolerant of this with a lot of back pain. During that time he was started on inhaled amikacin and he was continued on hypertonic saline. He would have periodic flareups treated with Augmentin. He transferred his care back to the [facility] pulmonary in 2015 for convenience because he lives in [place].  HPI  08/16/2014 hasn't seen us in three years > As noted above Mr. Jake Martinez has been quite busy with various medical issues since I saw him last in 2012. He was diagnosed with cryptococcal pneumonia and apparently was treated with fluconazole for a year. He also notes that he underwent a lumbar puncture for evaluation of this. He was followed by both infectious diseases as well as pulmonary medicine [facility]. He was treated with fluconazole through April of 2015. He has also been started on inhaled amikacin in addition to hypertonic saline. He has been treated multiple times with Augmentin for flareups of his bronchiectasis. He continues to have no insurance and he does not work. He does not exercise very much. He says that he walks the length of his driveway 2 or 3 times a day and approximates this distance around 800 feet. He states that he produces a lot of mucus which is typically pale green on the months when he is using  inhaled amikacin. It is usually white when he is not using it. He is complaining of a sharp pain in his right lung that started when he had fever approximately 7-10 days prior to this admission. This pain has migrated to the left lung. He says he felt a fever but he never measured it. He has not been febrile since then. His dyspnea and sputum production have been at baseline. Even though his [facility] state he's been intolerant of Symbicort he tells me that he is actually taking it as well as Spiriva. He says he uses hypertonic saline 3 times a day as well as taste and 3 times a day.  Past Medical History  Diagnosis Date  . Alpha-1-antitrypsin deficiency   . Bronchiectasis   . Hyperlipidemia   . Chronic headaches   . OSA (obstructive sleep apnea)      Family History  Problem Relation Age of Onset  . Alpha-1 antitrypsin deficiency      ? which parent   . Emphysema Maternal Uncle     smoker  . Colon cancer Father   . Heart disease Maternal Uncle      History   Social History  . Marital Status: Married    Spouse Name: N/A    Number of Children: 3  . Years of Education: N/A   Occupational History  . Unemployed      was a Psychologist, occupationalwelder for many years   Social History Main Topics  .  Smoking status: Never Smoker   . Smokeless tobacco: Former Neurosurgeon    Quit date: 11/02/1997  . Alcohol Use: No  . Drug Use: No  . Sexual Activity: Not on file   Other Topics Concern  . Not on file   Social History Narrative  . No narrative on file     Allergies  Allergen Reactions  . Codeine Itching     Outpatient Prescriptions Prior to Visit  Medication Sig Dispense Refill  . albuterol (PROVENTIL HFA) 108 (90 BASE) MCG/ACT inhaler Inhale 2 puffs into the lungs every 6 (six) hours as needed.        Marland Kitchen albuterol (PROVENTIL) (2.5 MG/3ML) 0.083% nebulizer solution 1 vial in nebulizer three times daily with sodium chloride       . Cholecalciferol (VITAMIN D) 2000 UNITS CAPS Take 1 capsule by mouth  daily.        . fluticasone (FLONASE) 50 MCG/ACT nasal spray Place 2 sprays into the nose daily.        Marland Kitchen nystatin (MYCOSTATIN) 100000 UNIT/ML suspension Take 5 mLs by mouth 4 (four) times daily.        . Sodium Chloride, Inhalant, 7 % NEBU Inhale 1 vial into the lungs 3 (three) times daily. With albuterol       . Sodium Chloride-Sodium Bicarb (AYR SALINE NASAL NETI RINSE NA) Use twice per day       . levofloxacin (LEVAQUIN) 750 MG tablet Take 750 mg by mouth daily. Started 11/20       . Mometasone Furo-Formoterol Fum (DULERA) 200-5 MCG/ACT AERO Inhale 2 puffs into the lungs 2 (two) times daily.        Marland Kitchen tobramycin, PF, (TOBI) 300 MG/5ML nebulizer solution Take 300 mg by nebulization 2 (two) times daily. Takes twice daily every other month        No facility-administered medications prior to visit.      Review of Systems  Constitutional: Positive for fatigue. Negative for fever, chills, activity change and appetite change.  HENT: Negative for congestion, ear pain, hearing loss, postnasal drip, rhinorrhea, sinus pressure and sneezing.   Eyes: Negative for redness, itching and visual disturbance.  Respiratory: Positive for cough, chest tightness and shortness of breath. Negative for wheezing.   Cardiovascular: Positive for chest pain. Negative for palpitations and leg swelling.  Gastrointestinal: Negative for nausea, vomiting, abdominal pain, diarrhea, constipation, blood in stool and abdominal distention.  Musculoskeletal: Positive for back pain. Negative for arthralgias, gait problem, joint swelling, myalgias, neck pain and neck stiffness.  Skin: Negative for rash.  Neurological: Negative for dizziness, light-headedness, numbness and headaches.  Hematological: Does not bruise/bleed easily.  Psychiatric/Behavioral: Negative for confusion and dysphoric mood.       Objective:   Physical Exam Filed Vitals:   08/16/14 0918  BP: 128/72  Pulse: 69  Height: 5\' 7"  (1.702 m)  Weight: 198 lb  (89.812 kg)  SpO2: 100%   RA  Gen: well appearing, no acute distress HEENT: NCAT, PERRL, EOMi, OP clear, neck supple without masses PULM: rhonchi bilaterally, no wheezing CV: RRR, no mgr, no JVD AB: BS+, soft, nontender, no hsm Ext: warm, no edema, no clubbing, no cyanosis Derm: no rash or skin breakdown Neuro: A&Ox4, CN II-XII intact, strength 5/5 in all 4 extremities       Assessment & Plan:   Bronchiectasis Mr. Tigert is an alpha-1 carrier who has mild bronchiectasis seen on CT scanning. He has been followed by our clinic over 3 years ago  but in the interim he has been followed by [facility]. He has mild airflow obstruction based on the most recent pulmonary function testing in 2012.  Currently, he is without exacerbation. In terms of his maintenance treatment for bronchiectasis it is as follows: -Mucociliary clearance with hypertonic saline 3 times a day and flutter valve 3 times a day, albuterol 3 times a day, Symbicort twice a day -For antibiotics he uses inhaled amikacin every other month -His nutritional status is adequate as he is overweight  The only thing that I could consider adding would be azithromycin 3 times a week, we will address that on the next visit.   Plan: -Continue Symbicort, hypertonic saline, flutter valve, albuterol -Continue every other month inhaled amikacin -Consider adding daily azithromycin or 3 times a week azithromycin -Sputum culture for AFB, fungal, bacterial -Repeat pulmonary function testing -Followup 4 months in [place]    Chest pain He has some mild pleuritic back and migrating chest pain which is likely due to the cough and bronchiectasis. He is low risk for pulmonary embolism but we will check a d-dimer considering the fact that he has had some mild increasing dyspnea with this. We will also check a chest x-ray today.  Plan: -Chest x-ray -D-dimer -A d-dimer is positive then we will order a CT angiogram of his chest. -  Otherwise continue mucociliary clearance as written  Alpha-1-antitrypsin carrier He is a carrier but is not alpha-1 deficient. He is very focused on this and thinks that he is ultimately going to need a lung transplant. I advised him today that he does not have alpha-1 antitrypsin homozygous deficiency and he does not need replacement. Further, with mild airflow obstruction he does not need to worry about the possibility of a lung transplant anytime soon.    Updated Medication List Outpatient Encounter Prescriptions as of 08/16/2014  Medication Sig  . albuterol (PROVENTIL HFA) 108 (90 BASE) MCG/ACT inhaler Inhale 2 puffs into the lungs every 6 (six) hours as needed.    Marland Kitchen albuterol (PROVENTIL) (2.5 MG/3ML) 0.083% nebulizer solution 1 vial in nebulizer three times daily with sodium chloride   . Aztreonam Lysine (CAYSTON) 75 MG SOLR Inhale 75 mg into the lungs 3 (three) times daily.  . budesonide-formoterol (SYMBICORT) 160-4.5 MCG/ACT inhaler Inhale 2 puffs into the lungs 2 (two) times daily.  . Cholecalciferol (VITAMIN D) 2000 UNITS CAPS Take 1 capsule by mouth daily.    . fexofenadine (ALLEGRA) 180 MG tablet Take 180 mg by mouth daily.  . fluticasone (FLONASE) 50 MCG/ACT nasal spray Place 2 sprays into the nose daily.    . montelukast (SINGULAIR) 10 MG tablet Take 10 mg by mouth at bedtime.  Marland Kitchen nystatin (MYCOSTATIN) 100000 UNIT/ML suspension Take 5 mLs by mouth 4 (four) times daily.    Marland Kitchen omeprazole (PRILOSEC) 20 MG capsule Take 40 mg by mouth daily.  . rizatriptan (MAXALT) 10 MG tablet Take 10 mg by mouth as needed for migraine. May repeat in 2 hours if needed  . Sodium Chloride, Inhalant, 7 % NEBU Inhale 1 vial into the lungs 3 (three) times daily. With albuterol   . Sodium Chloride-Sodium Bicarb (AYR SALINE NASAL NETI RINSE NA) Use twice per day   . tiotropium (SPIRIVA) 18 MCG inhalation capsule Place 18 mcg into inhaler and inhale daily.  Marland Kitchen topiramate (TOPAMAX) 100 MG tablet Take 100 mg by  mouth daily.  . [DISCONTINUED] levofloxacin (LEVAQUIN) 750 MG tablet Take 750 mg by mouth daily. Started 11/20   . [  DISCONTINUED] Mometasone Furo-Formoterol Fum (DULERA) 200-5 MCG/ACT AERO Inhale 2 puffs into the lungs 2 (two) times daily.    . [DISCONTINUED] tobramycin, PF, (TOBI) 300 MG/5ML nebulizer solution Take 300 mg by nebulization 2 (two) times daily. Takes twice daily every other month

## 2014-08-16 NOTE — Progress Notes (Signed)
Quick Note:  lmtcb X1 ______ 

## 2014-08-16 NOTE — Assessment & Plan Note (Signed)
He has some mild pleuritic back and migrating chest pain which is likely due to the cough and bronchiectasis. He is low risk for pulmonary embolism but we will check a d-dimer considering the fact that he has had some mild increasing dyspnea with this. We will also check a chest x-ray today.  Plan: -Chest x-ray -D-dimer -A d-dimer is positive then we will order a CT angiogram of his chest. - Otherwise continue mucociliary clearance as written

## 2014-08-16 NOTE — Progress Notes (Signed)
Quick Note:  Spoke with pt, he is aware of results and recs. Nothing further needed. ______ 

## 2014-08-16 NOTE — Patient Instructions (Signed)
Provide us with a sample of your mucus  Keep using your inhalers as you are doing We will arrange a PFT in [place] We will call you with the results of your labs and CXR We will see you back in 4 months in [place]

## 2014-08-20 ENCOUNTER — Other Ambulatory Visit: Payer: Self-pay | Admitting: *Deleted

## 2014-08-20 ENCOUNTER — Ambulatory Visit: Payer: Self-pay | Admitting: Pulmonary Disease

## 2014-08-20 DIAGNOSIS — R0602 Shortness of breath: Secondary | ICD-10-CM

## 2014-08-20 LAB — PULMONARY FUNCTION TEST

## 2014-08-21 NOTE — Progress Notes (Signed)
Quick Note:  Spoke with pt, he is aware of results and recs. ______ 

## 2014-08-22 LAB — CULTURE, BLOOD, SINGLE SET ONLY
Organism ID, Bacteria: NO GROWTH
Organism ID, Bacteria: NO GROWTH

## 2014-09-17 LAB — FUNGUS CULTURE W SMEAR: Smear Result: NONE SEEN

## 2014-09-24 ENCOUNTER — Telehealth: Payer: Self-pay

## 2014-09-24 NOTE — Telephone Encounter (Signed)
Pt aware of results and recs.  Nothing further needed. 

## 2014-09-24 NOTE — Telephone Encounter (Signed)
-----   Message from Lupita Leash, MD sent at 09/24/2014  3:04 AM EST ----- A, Please let him know that his PFT was OK Thanks B

## 2014-10-15 ENCOUNTER — Ambulatory Visit: Payer: Self-pay | Admitting: Pulmonary Disease

## 2014-10-15 ENCOUNTER — Encounter: Payer: Self-pay | Admitting: Pulmonary Disease

## 2014-10-15 ENCOUNTER — Ambulatory Visit (INDEPENDENT_AMBULATORY_CARE_PROVIDER_SITE_OTHER): Payer: Self-pay | Admitting: Pulmonary Disease

## 2014-10-15 VITALS — BP 126/68 | HR 81 | Temp 98.1°F | Ht 67.0 in | Wt 203.0 lb

## 2014-10-15 DIAGNOSIS — J479 Bronchiectasis, uncomplicated: Secondary | ICD-10-CM

## 2014-10-15 DIAGNOSIS — E8801 Alpha-1-antitrypsin deficiency: Secondary | ICD-10-CM

## 2014-10-15 MED ORDER — LEVOFLOXACIN 750 MG PO TABS: 750.0000 mg | ORAL_TABLET | Freq: Every day | ORAL | Status: AC

## 2014-10-15 NOTE — Assessment & Plan Note (Addendum)
Jake Martinez continues to be a difficult case to treat because of the complexity of his disease. Specifically, he has grown out multiple bacterial and fungal and atypical mycobacterial organisms over the years. Interestingly, his CT scan to my knowledge is never shown severe bronchiectasis and his pulmonary function testing is really not that bad either. However, his symptoms are quite severe in that he describes significant shortness of breath, daily mucus production and severe cough.   I have reviewed the results of his recent visits from [facility] as well as all of his recent mycobacterial results from there, see highlighted above.  He is currently experiencing a flare of his bronchiectasis.  Based on the fact that he grew out Mycobacterium avium complex we need to continue to survey him for this. It would also prevent me from treating him with daily azithromycin.  Plan: -For the flare, I will treat him with Levaquin 14 days with a probiotic -Obtain sputum culture now before starting antibiotics, include AFB culture -CXR -Continue [place] every other month -Continue hypertonic saline twice a day -Continue Symbicort twice a day -Continue albuterol 2-3 times a day -Continue Spiriva, though I do not see a clear role for this

## 2014-10-15 NOTE — Patient Instructions (Signed)
Take the Levaquin for 14 days with a probiotic Use your c[place]ston, 7% saline, symbicort, and albuterol as prescribed We will see you back in January 2016

## 2014-10-15 NOTE — Assessment & Plan Note (Signed)
I have reviewed the records from [facility] and I cannot find evidence of this test ever being collected. I will reorder the test.

## 2014-10-15 NOTE — Progress Notes (Signed)
Subjective:    Patient ID: Jake Martinez, male    DOB: 1966/05/28, 48 y.o.   MRN: [id number]  Synopsis: Jake Martinez is an alpha-1 carrier who has mild bronchiectasis in the bases of his lungs. He was first evaluated by the [facility] pulmonary clinic in 2012. He then followed up with [facility] for the following 3 years. During that time he continued to have evidence of only mild airflow obstruction on pulmonary function testing. 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 He was treated with one year with fluconazole. He was followed by infectious diseases as well as pulmonary at [facility]. For some reason Roflumilast was attempted during that time and he was quite intolerant of this with a lot of back pain. During that time he was started on inhaled amikacin and he was continued on hypertonic saline. He would have periodic flareups treated with Augmentin. He transferred his care back to the [place] pulmonary in 2015 for convenience because he lives in [place].  HPI Chief Complaint  Patient presents with  . Follow-up    pt c/o prod cough with pale yellow mucus, chest and back tightness when coughing, worse on R side X1 week.     Jake Martinez has been struggling lately. He says that for the last 10-14 days he has been coughing up more yellow mucus, he has been experiencing more right sided chest pain and tightness, and he has been more short of breath. He says that this is consistent with his previous flares of bronchiectasis. His weight has not changed recently. He denies fevers or chills. He says that he has been having to use his albuterol more frequently due to chest tightness and wheezing. He denies leg swelling. He has been consistently using his Cayston as well as his hypertonic saline and albuterol. He also uses his Symbicort twice a day. He has been coughing up thick, yellow mucus. He denies recent hemoptysis.  Past Medical History  Diagnosis Date  . Alpha-1-antitrypsin  deficiency   . Bronchiectasis   . Hyperlipidemia   . Chronic headaches   . OSA (obstructive sleep apnea)      Review of Systems  Constitutional: Positive for fatigue. Negative for fever, chills, activity change and appetite change.  HENT: Negative for congestion, ear pain, hearing loss, postnasal drip, rhinorrhea, sinus pressure and sneezing.   Eyes: Negative for redness, itching and visual disturbance.  Respiratory: Positive for cough, chest tightness and shortness of breath. Negative for wheezing.   Cardiovascular: Positive for chest pain. Negative for palpitations and leg swelling.       Objective:   Physical Exam Filed Vitals:   10/15/14 1602  BP: 126/68  Pulse: 81  Temp: 98.1 F (36.7 C)  TempSrc: Oral  Height: 5\' 7"  (1.702 m)  Weight: 203 lb (92.08 kg)  SpO2: 96%   RA  Gen: Constant cough in clinic, no acute distress HEENT: NCAT, PERRL, EOMi, OP clear, neck supple without masses PULM: Frequent cough, wheezing bilaterally, crackles in bases CV: RRR, no mgr, no JVD AB: BS+, soft, nontender,  Ext: warm, no edema, no clubbing, no cyanosis Derm: no rash or skin breakdown Neuro: A&Ox4, CN II-XII intact, MAEW   November 2012 pulmonary function test at [facility] ratio 89%, FEV1 2.8 L (82% predicted, no change with bronchodilator) no lung volumes or DLCO performed October 2015 PFT > Ratio 87% FEV1 2.68 L (81% , 15% change with BD), TLC 5.25 L (86% pred), DLCO 22.2 (84% pred)  Respiratory [facility]: March 2014 cryptococcus July 2014 aspergillus Luxembourgiger October 2014 aspergillus Luxembourgiger October 2015 penicillium species October 2014 Actinomadura October 2014 Streptomyces January 2015 Actinomadura August 2015 Mycobacterium avium complex August 2015 Actinomadura October 2015 haemophilus species October 2015 "mold"     Assessment & Plan:   Bronchiectasis Jake Martinez to be a difficult case to treat because of the complexity of his disease. Specifically,  he has grown out multiple bacterial and fungal and atypical mycobacterial organisms over the years. Interestingly, his CT scan to my knowledge is never shown severe bronchiectasis and his pulmonary function testing is really not that bad either. However, his symptoms are quite severe in that he describes significant shortness of breath, daily mucus production and severe cough.   I have reviewed the results of [facility] as well as all of his recent mycobacterial results from there, see highlighted above.  He is currently experiencing a flare of his bronchiectasis.  Based on the fact that he grew out Mycobacterium avium complex we need to continue to survey him for this. It would also prevent me from treating him with daily azithromycin.  Plan: -For the flare, I will treat him with Levaquin 14 days with a probiotic -Obtain sputum culture now before starting antibiotics, include AFB culture -CXR -Continue Cayston every other month -Continue hypertonic saline twice a day -Continue Symbicort twice a day -Continue albuterol 2-3 times a day -Continue Spiriva, though I do not see a clear role for this   Alpha-1-antitrypsin carrier I have reviewed the records [facility] and I cannot find evidence of this test ever being collected. I will reorder the test.    Updated Medication List Outpatient Encounter Prescriptions as of 10/15/2014  Medication Sig  . acetaminophen (TYLENOL) 325 MG tablet Take 650 mg by mouth every 6 (six) hours as needed.  Marland Kitchen. albuterol (PROVENTIL HFA) 108 (90 BASE) MCG/ACT inhaler Inhale 2 puffs into the lungs every 6 (six) hours as needed.    Marland Kitchen. albuterol (PROVENTIL) (2.5 MG/3ML) 0.083% nebulizer solution 1 vial in nebulizer three times daily with sodium chloride   . Aztreonam Lysine (CAYSTON) 75 MG SOLR Inhale 75 mg into the lungs 3 (three) times daily.  . budesonide-formoterol (SYMBICORT) 160-4.5 MCG/ACT inhaler Inhale 2 puffs into the lungs 2  (two) times daily.  . Cholecalciferol (VITAMIN D) 2000 UNITS CAPS Take 1 capsule by mouth daily.    . fexofenadine (ALLEGRA) 180 MG tablet Take 180 mg by mouth daily.  . fluticasone (FLONASE) 50 MCG/ACT nasal spray Place 2 sprays into the nose daily.    . montelukast (SINGULAIR) 10 MG tablet Take 10 mg by mouth at bedtime.  Marland Kitchen. nystatin (MYCOSTATIN) 100000 UNIT/ML suspension Take 5 mLs by mouth 4 (four) times daily.    Marland Kitchen. omeprazole (PRILOSEC) 20 MG capsule Take 40 mg by mouth daily.  . rizatriptan (MAXALT) 10 MG tablet Take 10 mg by mouth as needed for migraine. May repeat in 2 hours if needed  . Sodium Chloride, Inhalant, 7 % NEBU Inhale 1 vial into the lungs 3 (three) times daily. With albuterol   . Sodium Chloride-Sodium Bicarb (AYR SALINE NASAL NETI RINSE NA) Use twice per day   . tiotropium (SPIRIVA) 18 MCG inhalation capsule Place 18 mcg into inhaler and inhale daily.  Marland Kitchen. topiramate (TOPAMAX) 100 MG tablet Take 100 mg by mouth daily.  Marland Kitchen. levofloxacin (LEVAQUIN) 750 MG tablet Take 1 tablet (750 mg total) by mouth daily.

## 2014-10-16 ENCOUNTER — Ambulatory Visit: Payer: Self-pay | Admitting: Pulmonary Disease

## 2014-10-16 ENCOUNTER — Telehealth: Payer: Self-pay | Admitting: Pulmonary Disease

## 2014-10-16 NOTE — Telephone Encounter (Signed)
Advised pt's wife that BQ did put on the order to have his CXR done in [facility]. Will route message to [facility] so that they can fax order over to [facility].  Thanks ladies!

## 2014-10-17 NOTE — Telephone Encounter (Signed)
This was taken care of yesterday 12.15.15 Spoke with Jonny Ruiz from [facility] - order requisition printed for the cxr, stamped w/ BQ and faxed to John at [facility]. Nothing further needed; will sign off.

## 2014-10-19 ENCOUNTER — Telehealth: Payer: Self-pay

## 2014-10-19 NOTE — Telephone Encounter (Signed)
-----   Message from Lupita Leashouglas B McQuaid, MD sent at 10/18/2014  9:40 PM EST ----- Jake Martinez,  Please let him know that his chest x-ray was okay.  Thanks, Kipp BroodBrent

## 2014-10-19 NOTE — Telephone Encounter (Signed)
Pt aware of results and recs.  Nothing further needed. 

## 2014-11-07 ENCOUNTER — Ambulatory Visit (INDEPENDENT_AMBULATORY_CARE_PROVIDER_SITE_OTHER): Payer: Self-pay | Admitting: Pulmonary Disease

## 2014-11-07 ENCOUNTER — Encounter: Payer: Self-pay | Admitting: Pulmonary Disease

## 2014-11-07 ENCOUNTER — Ambulatory Visit: Payer: Self-pay | Admitting: Pulmonary Disease

## 2014-11-07 VITALS — BP 122/68 | HR 81 | Ht 67.0 in | Wt 200.0 lb

## 2014-11-07 DIAGNOSIS — J479 Bronchiectasis, uncomplicated: Secondary | ICD-10-CM

## 2014-11-07 DIAGNOSIS — E8801 Alpha-1-antitrypsin deficiency: Secondary | ICD-10-CM

## 2014-11-07 DIAGNOSIS — B450 Pulmonary cryptococcosis: Secondary | ICD-10-CM | POA: Insufficient documentation

## 2014-11-07 LAB — HEPATIC FUNCTION PANEL A (ARMC)
ALT: 37 U/L
Albumin: 4.2 g/dL (ref 3.4–5.0)
Alkaline Phosphatase: 101 U/L
BILIRUBIN DIRECT: 0.1 mg/dL (ref 0.0–0.2)
BILIRUBIN TOTAL: 0.4 mg/dL (ref 0.2–1.0)
SGOT(AST): 34 U/L (ref 15–37)
TOTAL PROTEIN: 7.4 g/dL (ref 6.4–8.2)

## 2014-11-07 NOTE — Progress Notes (Signed)
Subjective:    Patient ID: Jake Martinez, male    DOB: 02/01/1966, 49 y.o.   MRN: [id number]  Synopsis: Jake Martinez is an alpha-1 carrier who has mild bronchiectasis in the bases of his lungs. He was first evaluated by the [facility] pulmonary clinic in 2012. He then followed up with [facility] for the following 3 years. During that time he continued to have evidence of only mild airflow obstruction on pulmonary function testing. 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 He was treated with one year with fluconazole. He was followed by infectious diseases as well as pulmonary at [facility]. For some reason Roflumilast was attempted during that time and he was quite intolerant of this with a lot of back pain. During that time he was started on inhaled amikacin and he was continued on hypertonic saline. He would have periodic flareups treated with Augmentin. He transferred his care back to the [place] pulmonary in 2015 for convenience because he lives in [place].  HPI Chief Complaint  Patient presents with  . Follow-up    pt still has cough and mucus daily. He is concerned about itching which is not controlled by current meds.   Jake Martinez says that he has been suffering from an itching rash lately that is reminiscent of when he had cryptococcus in the past.  He said that it just popped up in the last few weeks.  His breathing is up and down; some days he has more mucus than other.  He has symptoms haven't been worse than baseline, but he seems to be controlling his mucus production lately.     Past Medical History  Diagnosis Date  . Alpha-1-antitrypsin deficiency   . Bronchiectasis   . Hyperlipidemia   . Chronic headaches   . OSA (obstructive sleep apnea)      Review of Systems  Constitutional: Negative for fever, chills, activity change, appetite change and fatigue.  HENT: Negative for congestion, ear pain, hearing loss, postnasal drip, rhinorrhea, sinus pressure and  sneezing.   Eyes: Negative for redness, itching and visual disturbance.  Respiratory: Positive for cough and shortness of breath. Negative for chest tightness and wheezing.   Cardiovascular: Negative for chest pain, palpitations and leg swelling.       Objective:   Physical Exam Filed Vitals:   11/07/14 1345  BP: 122/68  Pulse: 81  Height:  (1.702 m)  Weight: 200 lb (90.719 kg)  SpO2: 94%   RA  Gen: Constant cough in clinic, no acute distress HEENT: NCAT, EOMi, OP clear PULM: Frequent cough, wheezing bilaterally, crackles in bases CV: RRR, no mgr, no JVD AB: BS+, soft, nontender,  Ext: warm, no edema, no clubbing, no cyanosis Derm: no rash or skin breakdown Neuro: A&Ox4, MAEW   November 2012 pulmonary function test at [facility] ratio 89%, FEV1 2.8 L (82% predicted, no change with bronchodilator) no lung volumes or DLCO performed October 2015 PFT > Ratio 87% FEV1 2.68 L (81% , 15% change with BD), TLC 5.25 L (86% pred), DLCO 22.2 (84% pred)  Respiratory culture results from [facility]: March 2014 cryptococcus July 2014 aspergillus [place] October 2014 aspergillus [place] October 2015 penicillium species October 2014 Actinomadura October 2014 Streptomyces January 2015 Actinomadura August 2015 Mycobacterium avium complex August 2015 Actinomadura October 2015 haemophilus species October 2015 "mold"  10/2014 Sputum culture > strep pneumo 10/2014 Sputum Fungal> fungus isolated, speciation pending 10/2014 Sputum AFB> smear negative, culture pending    Assessment & Plan:  Bronchiectasis This has been a stable interval for Jake Martinez. He has cough with daily sputum production but his symptoms are at baseline right now. His mucociliary clearance techniques are good and he continues to rotate inhaled antibiotics with inhaled amikacin every other month. He also is doing well with hypertonic saline as prescribed by the pulmonologist at [facility].  Plan:  -continue mucociliary  clearance as prescribed -Continue hypertonic saline and inhaled antibiotics  Cryptococcal pneumonitis He has a history of cryptococcal pneumonitis which was treated for over one year with antifungal therapy starting in March 2014 at [facility]. He said during that time he did undergo a lumbar puncture which as I understand it did not show evidence of cryptococcal meningitis. I'm concerned at this point that even though his symptoms seem to be stable considering his chronic bronchiectasis, he is currently experiencing a rash which was similar to his previous presentation of cryptococcal pneumonitis. Further, there is a sputum specimen pending right now growing a fungus of some sort which was unable to be identified. This is been sent to a state lab for further evaluation.  Plan: -Check serum cryptococcal antigen titer -Follow-up sputum culture -Refer to infectious diseases to establish care with a local ID doctor as he would like to change all of his care to [facility]  Alpha-1-antitrypsin carrier I still have not seen any record to document the fact that he is an alpha-1 antitrypsin carrier. I'm going to send this lab off today as well as LFTs.    Updated Medication List Outpatient Encounter Prescriptions as of 11/07/2014  Medication Sig  . acetaminophen (TYLENOL) 325 MG tablet Take 650 mg by mouth every 6 (six) hours as needed.  Marland Kitchen albuterol (PROVENTIL HFA) 108 (90 BASE) MCG/ACT inhaler Inhale 2 puffs into the lungs every 6 (six) hours as needed.    Marland Kitchen albuterol (PROVENTIL) (2.5 MG/3ML) 0.083% nebulizer solution 1 vial in nebulizer three times daily with sodium chloride   . Aztreonam Lysine (CAYSTON) 75 MG SOLR Inhale 75 mg into the lungs 3 (three) times daily.  . budesonide-formoterol (SYMBICORT) 160-4.5 MCG/ACT inhaler Inhale 2 puffs into the lungs 2 (two) times daily.  . Cholecalciferol (VITAMIN D) 2000 UNITS CAPS Take 1 capsule by mouth daily.    . fexofenadine (ALLEGRA) 180 MG tablet  Take 180 mg by mouth daily.  . fluticasone (FLONASE) 50 MCG/ACT nasal spray Place 2 sprays into the nose daily.    . montelukast (SINGULAIR) 10 MG tablet Take 10 mg by mouth at bedtime.  Marland Kitchen nystatin (MYCOSTATIN) 100000 UNIT/ML suspension Take 5 mLs by mouth 4 (four) times daily.    Marland Kitchen omeprazole (PRILOSEC) 20 MG capsule Take 40 mg by mouth daily.  . rizatriptan (MAXALT) 10 MG tablet Take 10 mg by mouth as needed for migraine. May repeat in 2 hours if needed  . Sodium Chloride, Inhalant, 7 % NEBU Inhale 1 vial into the lungs 3 (three) times daily. With albuterol   . Sodium Chloride-Sodium Bicarb (AYR SALINE NASAL NETI RINSE NA) Use twice per day   . tiotropium (SPIRIVA) 18 MCG inhalation capsule Place 18 mcg into inhaler and inhale daily.  Marland Kitchen topiramate (TOPAMAX) 100 MG tablet Take 100 mg by mouth daily.

## 2014-11-07 NOTE — Assessment & Plan Note (Signed)
He has a history of cryptococcal pneumonitis which was treated for over one year with antifungal therapy starting in March 2014 at [facility]. He said during that time he did undergo a lumbar puncture which as I understand it did not show evidence of cryptococcal meningitis. I'm concerned at this point that even though his symptoms seem to be stable considering his chronic bronchiectasis, he is currently experiencing a rash which was similar to his previous presentation of cryptococcal pneumonitis. Further, there is a sputum specimen pending right now growing a fungus of some sort which was unable to be identified. This is been sent to a state lab for further evaluation.  Plan: -Check serum cryptococcal antigen titer -Follow-up sputum culture -Refer to infectious diseases to establish care with a local ID doctor as he would like to change all of his care to [facility]

## 2014-11-07 NOTE — Assessment & Plan Note (Signed)
I still have not seen any record to document the fact that he is an alpha-1 antitrypsin carrier. I'm going to send this lab off today as well as LFTs.

## 2014-11-07 NOTE — Assessment & Plan Note (Signed)
This has been a stable interval for Jake Martinez. He has cough with daily sputum production but his symptoms are at baseline right now. His mucociliary clearance techniques are good and he continues to rotate inhaled antibiotics with inhaled amikacin every other month. He also is doing well with hypertonic saline as prescribed by the pulmonologist at [facility].  Plan:  -continue mucociliary clearance as prescribed -Continue hypertonic saline and inhaled antibiotics

## 2014-11-07 NOTE — Patient Instructions (Signed)
Continue taking your medications as you are doing Use the nystatin powder on the rash We will refer you to infectious disease her in town We will see you back in 6-8 weeks or sooner if needed

## 2014-11-09 ENCOUNTER — Telehealth: Payer: Self-pay

## 2014-11-09 LAB — CULTURE, FUNGUS WITHOUT SMEAR

## 2014-11-09 NOTE — Telephone Encounter (Signed)
Pt aware of results.  Nothing further needed.  

## 2014-11-09 NOTE — Telephone Encounter (Signed)
-----   Message from Lupita Leash, MD sent at 11/09/2014  3:48 PM EST ----- A, Please let him know that his labs were OK Thanks B

## 2014-11-15 ENCOUNTER — Telehealth: Payer: Self-pay

## 2014-11-15 NOTE — Telephone Encounter (Signed)
-----   Message from Lupita Leash, MD sent at 11/15/2014  5:10 AM EST ----- A,  Please let him know that his blood test for cryptococcus was normal.  This is good.  Thanks B

## 2014-11-15 NOTE — Telephone Encounter (Signed)
Pt aware of results.  Nothing further needed.  

## 2014-11-16 LAB — EXPECTORATED SPUTUM ASSESSMENT W REFEX TO RESP CULTURE

## 2014-12-14 ENCOUNTER — Telehealth: Payer: Self-pay | Admitting: Pulmonary Disease

## 2014-12-14 NOTE — Telephone Encounter (Signed)
Spoke with wife.  States pt is trying to get approved for disability.  He is in the process of trying to hire an attorney for this but attorney is requesting letter from physician stating pt's medication condition.  Wife would like to know if BQ will write a letter regarding pt's "serious lung disease."  In letter, would like to know if he could include pt should not be lifting heavy objects and/or should not be in certain atmospheres.  Would like letter mailed to verified home address if BQ able to complete.  Dr. Kendrick Fries, please advise.  Thank you.

## 2014-12-15 NOTE — Telephone Encounter (Signed)
Yes I'm happy to do that.  Please note the following:  To whom it may concern:  I have cared for Mr. Jake Martinez for over 2 years. He has bronchiectasis likely related to a genetic condition called alpha-1 antitrypsin deficiency. This causes significant shortness of breath, cough, and daily chest congestion with mucus production. It has also caused recurrent chest infections some of which have been quite serious requiring multiple consultations from [facility] as well as antibiotics for over a year at a time. He requires frequent office visits for management of his bronchiectasis and day-to-day management of this disease involves use of multiple inhalers throughout the course of the day. Because of this, he is significantly limited in his ability to work. Specifically, working in dusty environments or an extreme temperatures or having to carry anything over 10-15 pounds would be very difficult for him. I will continue caring for him in the near future and him happy to answer any questions as needed about his condition.  Sincerely,  Dr. Heber Monteagle

## 2014-12-17 ENCOUNTER — Encounter: Payer: Self-pay | Admitting: *Deleted

## 2014-12-17 NOTE — Telephone Encounter (Signed)
Letter has been printed and signed by BQ. Jake Martinez is aware that letter has been placed in mail today. Nothing more needed at this time.

## 2015-01-22 ENCOUNTER — Ambulatory Visit (INDEPENDENT_AMBULATORY_CARE_PROVIDER_SITE_OTHER): Payer: Self-pay | Admitting: Pulmonary Disease

## 2015-01-22 ENCOUNTER — Encounter: Payer: Self-pay | Admitting: Pulmonary Disease

## 2015-01-22 VITALS — BP 126/70 | HR 77 | Ht 67.0 in | Wt 196.0 lb

## 2015-01-22 DIAGNOSIS — B459 Cryptococcosis, unspecified: Secondary | ICD-10-CM

## 2015-01-22 DIAGNOSIS — J479 Bronchiectasis, uncomplicated: Secondary | ICD-10-CM

## 2015-01-22 DIAGNOSIS — B450 Pulmonary cryptococcosis: Secondary | ICD-10-CM

## 2015-01-22 MED ORDER — PREDNISONE 10 MG PO TABS: ORAL_TABLET | ORAL | Status: DC

## 2015-01-22 MED ORDER — AMOXICILLIN-POT CLAVULANATE 875-125 MG PO TABS: 1.0000 | ORAL_TABLET | Freq: Two times a day (BID) | ORAL | Status: AC

## 2015-01-22 NOTE — Assessment & Plan Note (Addendum)
Jake Martinez is having another exacerbation of bronchiectasis today. Reviewing his records from [facility] demonstrates multiple organisms have grown from sputum cultures over the years from bacterial, fungal, and AFB specimens. Clearly the most significant of these in the last 3 years was cryptococcus. He has not grown this on repeated cultures in the last year. However, I am concerned about the growth of Rhizopus from the December 2015 sputum culture. In October 2015 he grew out and non-speciate it mold from a culture collected at [name]. It's not clear to me if this represented this organism at that time.  Plan (Acute): -I'm going to treat an exacerbation of bronchiectasis with Augmentin for 10 days and a short course of prednisone -I've advised him to increase his mucociliary clearance measures for the next several weeks. -I'm going to refer him to infectious diseases to establish care and to assist me in the management of his very complex pulmonary infectious issues. My believe is at this time that we do not need to treat the fungal infections right now, but I'm going to collect a sputum culture again to see if he grows this organism out once again. Additionally, I'm going to get a CT scan of his lungs to see if there has been evidence of an invasive infection which was suggested on the last CT in August 2015.  Plan (chronic): -Continue every other month inhaled amikacin -Continue twice a day hypertonic saline with daily efforts at mucociliary clearance with a flutter valve -continue Spiriva -Continue Symbicort

## 2015-01-22 NOTE — Assessment & Plan Note (Signed)
Fortunately he has not grown out cryptococcus on multiple fungal cultures in the last 12 months.   Plan: I'm going to collect fungal cultures as well as a serum cryptococcal antigen today. He may need a bronchoscopy with BAL if the CT scan is strongly suggestive of an invasive infection and he does not do well with Augmentin. As noted above ongoing to refer him to infectious diseases.

## 2015-01-22 NOTE — Progress Notes (Signed)
Subjective:    Patient ID: Jake Martinez, male    DOB: 07-25-1966, 49 y.o.   MRN: [id number]  Synopsis: Mr. Jake Martinez is an alpha-1 carrier who has mild bronchiectasis in the bases of his lungs. He was first evaluated by the [facility] pulmonary clinic in 2012. He then followed up with [facility] for the following 3 years. During that time he continued to have evidence of only mild airflow obstruction on pulmonary function testing. 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 He was treated with one year with fluconazole. He was followed by infectious diseases as well as pulmonary at [facility]. For some reason Roflumilast was attempted during that time and he was quite intolerant of this with a lot of back pain. During that time he was started on inhaled amikacin and he was continued on hypertonic saline. He would have periodic flareups treated with Augmentin. He transferred his care back to the [place] pulmonary in 2015 for convenience because he lives in [place].  HPI Chief Complaint  Patient presents with  . Follow-up    Pt c/o prod cough with yellow mucus with a foul odor X3 weeks.  Also having to use saline rinses for sinus congestion.     [name] says that for the last several weeks he has been struggling more.  He says that when he is taking the [place] he does well, but on his off months he has more wheezing and increased mucus congestion.  This is an off month right now so he is feeling a little worse with these symptoms and some cough.  He has some soreness in his legs and hips.  He has some sinus congestion.  No itchy eyes or scratchy throat.  He has a bad taste when he swallows.   No fevers or chills.    Past Medical History  Diagnosis Date  . Alpha-1-antitrypsin deficiency   . Bronchiectasis   . Hyperlipidemia   . Chronic headaches   . OSA (obstructive sleep apnea)      Review of Systems  Constitutional: Negative for fever, chills, activity change, appetite  change and fatigue.  HENT: Negative for congestion, ear pain, hearing loss, postnasal drip, rhinorrhea, sinus pressure and sneezing.   Eyes: Negative for redness, itching and visual disturbance.  Respiratory: Positive for cough and shortness of breath. Negative for chest tightness and wheezing.   Cardiovascular: Negative for chest pain, palpitations and leg swelling.       Objective:   Physical Exam Filed Vitals:   01/22/15 1617  BP: 126/70  Pulse: 77  Height:  (1.702 m)  Weight: 196 lb (88.905 kg)  SpO2: 97%   RA  Gen: Constant cough in clinic, no acute distress HEENT: NCAT, EOMi, OP clear PULM: wheezing and loud rhonchi throughout CV: RRR, no mgr, no JVD AB: BS+, soft, nontender,  Ext: warm, no edema, no clubbing, no cyanosis Derm: no rash or skin breakdown Neuro: A&Ox4, MAEW   November 2012 pulmonary function test at [facility] ratio 89%, FEV1 2.8 L (82% predicted, no change with bronchodilator) no lung volumes or DLCO performed October 2015 PFT > Ratio 87% FEV1 2.68 L (81% , 15% change with BD), TLC 5.25 L (86% pred), DLCO 22.2 (84% pred)  Respiratory culture results from [facility]: March 2014 cryptococcus July 2014 aspergillus [place] October 2014 aspergillus [place] October 2015 penicillium species October 2014 Actinomadura October 2014 Streptomyces January 2015 Actinomadura August 2015 Mycobacterium avium complex August 2015 Actinomadura October 2015 haemophilus  species October 2015 "mold"  10/2014 Sputum culture > strep pneumo 10/2014 Sputum Fungal> fungus isolated, speciation pending 10/2014 Sputum AFB> smear negative, culture pending    Assessment & Plan:   Bronchiectasis Duell is having another exacerbation of bronchiectasis today. Reviewing his records from [facility] demonstrates multiple organisms have grown from sputum cultures over the years from bacterial, fungal, and AFB specimens. Clearly the most significant of these in the last 3 years was cryptococcus. He has  not grown this on repeated cultures in the last year. However, I am concerned about the growth of Rhizopus from the December 2015 sputum culture. In October 2015 he grew out and non-speciate it mold from a culture collected at [facility]. It's not clear to me if this represented this organism at that time.  Plan (Acute): -I'm going to treat an exacerbation of bronchiectasis with Augmentin for 10 days and a short course of prednisone -I've advised him to increase his mucociliary clearance measures for the next several weeks. -I'm going to refer him to infectious diseases to establish care and to assist me in the management of his very complex pulmonary infectious issues. My believe is at this time that we do not need to treat the fungal infections right now, but I'm going to collect a sputum culture again to see if he grows this organism out once again. Additionally, I'm going to get a CT scan of his lungs to see if there has been evidence of an invasive infection which was suggested on the last CT in August 2015.  Plan (chronic): -Continue every other month inhaled amikacin -Continue twice a day hypertonic saline with daily efforts at mucociliary clearance with a flutter valve -continue Spiriva -Continue Symbicort   Cryptococcal pneumonitis Fortunately he has not grown out cryptococcus on multiple fungal cultures in the last 12 months.   Plan: I'm going to collect fungal cultures as well as a serum cryptococcal antigen today. He may need a bronchoscopy with BAL if the CT scan is strongly suggestive of an invasive infection and he does not do well with Augmentin. As noted above ongoing to refer him to infectious diseases.     Updated Medication List Outpatient Encounter Prescriptions as of 01/22/2015  Medication Sig  . acetaminophen (TYLENOL) 325 MG tablet Take 650 mg by mouth every 6 (six) hours as needed.  Marland Kitchen albuterol (PROVENTIL HFA) 108 (90 BASE) MCG/ACT inhaler Inhale 2 puffs into the lungs  every 6 (six) hours as needed.    Marland Kitchen albuterol (PROVENTIL) (2.5 MG/3ML) 0.083% nebulizer solution 1 vial in nebulizer three times daily with sodium chloride   . budesonide-formoterol (SYMBICORT) 160-4.5 MCG/ACT inhaler Inhale 2 puffs into the lungs 2 (two) times daily.  . Cholecalciferol (VITAMIN D) 2000 UNITS CAPS Take 1 capsule by mouth daily.    . fexofenadine (ALLEGRA) 180 MG tablet Take 180 mg by mouth daily.  . fluticasone (FLONASE) 50 MCG/ACT nasal spray Place 2 sprays into the nose daily.    . montelukast (SINGULAIR) 10 MG tablet Take 10 mg by mouth at bedtime.  Marland Kitchen nystatin (MYCOSTATIN) 100000 UNIT/ML suspension Take 5 mLs by mouth 4 (four) times daily.    Marland Kitchen omeprazole (PRILOSEC) 20 MG capsule Take 40 mg by mouth daily.  . rizatriptan (MAXALT) 10 MG tablet Take 10 mg by mouth as needed for migraine. May repeat in 2 hours if needed  . Sodium Chloride, Inhalant, 7 % NEBU Inhale 1 vial into the lungs 3 (three) times daily. With albuterol   . Sodium  Chloride-Sodium Bicarb (AYR SALINE NASAL NETI RINSE NA) Use twice per day   . tiotropium (SPIRIVA) 18 MCG inhalation capsule Place 18 mcg into inhaler and inhale daily.  Marland Kitchen topiramate (TOPAMAX) 100 MG tablet Take 100 mg by mouth daily.  Marland Kitchen amoxicillin-clavulanate (AUGMENTIN) 875-125 MG per tablet Take 1 tablet by mouth 2 (two) times daily.  . Aztreonam Lysine (CAYSTON) 75 MG SOLR Inhale 75 mg into the lungs 3 (three) times daily. Use for 28 days, off for 28 days.  . predniSONE (DELTASONE) 10 MG tablet Take  po daily for 3 days, then take  po daily for 3 days, then take  po daily for two days, then take  po daily for 2 days

## 2015-01-22 NOTE — Patient Instructions (Signed)
Take the augmentin for 10 days Take the prednisone taper We will arrange for you to see an infectious disease specialist Please provide [place] with three mucus samples

## 2015-01-24 ENCOUNTER — Ambulatory Visit: Payer: Self-pay | Admitting: Pulmonary Disease

## 2015-01-28 ENCOUNTER — Other Ambulatory Visit: Payer: Self-pay | Admitting: *Deleted

## 2015-01-28 DIAGNOSIS — J479 Bronchiectasis, uncomplicated: Secondary | ICD-10-CM

## 2015-01-31 LAB — RESPIRATORY CULTURE OR RESPIRATORY AND SPUTUM CULTURE
CULTURE: NORMAL
ORGANISM ID, BACTERIA: NORMAL

## 2015-02-01 ENCOUNTER — Telehealth: Payer: Self-pay | Admitting: Pulmonary Disease

## 2015-02-01 NOTE — Telephone Encounter (Signed)
Pt would like results from his CT scan. Explained to him that BQ has been on vacation.  BQ - please advise. Thanks.

## 2015-02-06 NOTE — Telephone Encounter (Signed)
Results printed from canopy and placed in BQ's look-at box

## 2015-02-06 NOTE — Telephone Encounter (Signed)
Please make sure this is in my look at folder

## 2015-02-07 ENCOUNTER — Telehealth: Payer: Self-pay

## 2015-02-07 NOTE — Telephone Encounter (Signed)
A,  Please let him know that his CT scan looked OK.  Thanks  B  ----------------------  lmtcb X1 for pt.

## 2015-02-08 NOTE — Telephone Encounter (Signed)
Spoke with pt, he is aware of results.  Nothing further needed. 

## 2015-02-11 ENCOUNTER — Encounter: Payer: Self-pay | Admitting: Infectious Diseases

## 2015-02-11 ENCOUNTER — Ambulatory Visit (INDEPENDENT_AMBULATORY_CARE_PROVIDER_SITE_OTHER): Payer: Self-pay | Admitting: Infectious Diseases

## 2015-02-11 VITALS — BP 124/81 | HR 75 | Temp 98.3°F | Ht 67.0 in | Wt 196.0 lb

## 2015-02-11 DIAGNOSIS — J470 Bronchiectasis with acute lower respiratory infection: Secondary | ICD-10-CM

## 2015-02-11 NOTE — Addendum Note (Signed)
Addended by: Andree Coss on: 02/11/2015 05:00 PM   Modules accepted: Orders

## 2015-02-11 NOTE — Assessment & Plan Note (Addendum)
I spoke with Jake Martinez and his wife and I do not get the overwhelming sense that he is worsening currently. He appears to be clinically stable. Given that, will repeat his Cx but not give him further anbx of further anti fungals at this point. If he has repeatedly postive Cx for mycobacteria, could consider treating him for this. Given his architecture is most likely severely abnormal, he may have significant colonization of his airways with any of the multiple pathogens which were present previously.  Hopefully, repeating his Cx now will give [place] a baseline with which we could use to taylor tx if he has further exacerbations/worsening  My great appreciation to Dr Kendrick Fries for this interesting consult as well as for his thorough documentation.

## 2015-02-11 NOTE — Progress Notes (Signed)
   Subjective:    Patient ID: Jake Martinez, male    DOB: 15-Aug-1966, 49 y.o.   MRN: [id number]  HPI 49 yo M with hx of alpha 1 anti-trypsin  carrier, bronchiectasis (mild) who has had previous cryptococcal PNA treated at [facility] 01-2013, treated with fluconazole for 1 year. His LP was (-) for crypto.   He has periodic flares of his bronchiectasis, treated with augmentin.  He was seen 01-28-15 at pulmonary with worsening cough and foul smelling sputum. He was given a course of augmentin. He had repeat sputum cx, afb, fungus that are all negative to date.  He had a repeat CT scan ~ 2 weeks ago. He states that "they weren't able to find anything".   He states he wheezes a lot (about the same). He has frequent coughing spells, when mucus breaks up from his lungs. Occas dry cough. Sputum is green/pale/clear. Foul tasting.  Has been using NaCl nebulizers to help break up his sputum. Also doing amikacin daily for 1 month (then skips a month).  States he has previously had aspergillus, pseudomonas, "black mold".  Resp Cx [facility]) 06-26-14: actinomadura, MAI Rev Cx showed aspergillus, penicillium, actinomadura, streptomyces, haemophilus.(see [facility] Cx in care everywhere).  PMHx/Soc/FHx reviewed.   Review of Systems  Constitutional: Negative for fever and chills.  Respiratory: Positive for cough, shortness of breath and wheezing.   Gastrointestinal: Negative for diarrhea and constipation.  Genitourinary: Negative for difficulty urinating.  Neurological: Positive for headaches.      Objective:   Physical Exam  Constitutional: He appears well-developed and well-nourished.  HENT:  Mouth/Throat: No oropharyngeal exudate.  Eyes: EOM are normal. Pupils are equal, round, and reactive to light.  Neck: Neck supple.  Cardiovascular: Normal rate, regular rhythm and normal heart sounds.   Pulmonary/Chest: Effort normal. He has wheezes.  Abdominal: Soft. Bowel sounds are normal. He exhibits no distension. There  is no tenderness.  Musculoskeletal: He exhibits no edema.  Lymphadenopathy:    He has no cervical adenopathy.      Assessment & Plan:

## 2015-02-15 ENCOUNTER — Encounter: Payer: Self-pay | Admitting: Pulmonary Disease

## 2015-02-24 LAB — FUNGUS CULTURE W SMEAR: Smear Result: NONE SEEN

## 2015-03-01 ENCOUNTER — Encounter: Payer: Self-pay | Admitting: Pulmonary Disease

## 2015-03-01 DIAGNOSIS — A310 Pulmonary mycobacterial infection: Secondary | ICD-10-CM | POA: Insufficient documentation

## 2015-03-19 LAB — AFB CULTURE WITH SMEAR (NOT AT ARMC): Acid Fast Smear: NONE SEEN

## 2015-03-25 ENCOUNTER — Encounter: Payer: Self-pay | Admitting: Pulmonary Disease

## 2015-05-20 ENCOUNTER — Encounter: Payer: Self-pay | Admitting: Pulmonary Disease

## 2015-05-20 ENCOUNTER — Ambulatory Visit (INDEPENDENT_AMBULATORY_CARE_PROVIDER_SITE_OTHER): Payer: Self-pay | Admitting: Pulmonary Disease

## 2015-05-20 VITALS — BP 118/64 | HR 71 | Temp 98.0°F | Ht 67.0 in | Wt 199.0 lb

## 2015-05-20 DIAGNOSIS — A310 Pulmonary mycobacterial infection: Secondary | ICD-10-CM

## 2015-05-20 DIAGNOSIS — J471 Bronchiectasis with (acute) exacerbation: Secondary | ICD-10-CM

## 2015-05-20 MED ORDER — AMOXICILLIN-POT CLAVULANATE 875-125 MG PO TABS: 1.0000 | ORAL_TABLET | Freq: Two times a day (BID) | ORAL | Status: AC

## 2015-05-20 NOTE — Progress Notes (Signed)
Subjective:    Patient ID: Jake Martinez, male    DOB: November 16, 1965, 49 y.o.   MRN: [id number]  Synopsis: Jake Martinez is an alpha-1 carrier who has mild bronchiectasis in the bases of his lungs. He was first evaluated by the [facility] pulmonary clinic in 2012. He then followed up with [facility] for the following 3 years. During that time he continued to have evidence of only mild airflow obstruction on pulmonary function testing. 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 He was treated with one year with fluconazole. He was followed by infectious diseases as well as pulmonary at [facility]. For some reason Roflumilast was attempted during that time and he was quite intolerant of this with a lot of back pain. During that time he was started on inhaled amikacin and he was continued on hypertonic saline. He would have periodic flareups treated with Augmentin. He transferred his care back to the [facility] pulmonary in 2015 for convenience because he lives in [place].  HPI Chief Complaint  Patient presents with  . Follow-up    Jake Martinez c/o sore throat, increased wheezing, prod cough with dark brown mucus X3-4 days.     Gor has a sore throat lately.  The last few days have been bad due to wheezing and coughing. He feels like he is choking some.  He says that typically minor infections that have symptoms like this would have improved by now.  He has been producing more dark brown mucus.  No fever or chills.   Some soreness in his throat but not neck stiffness.  Mild headache a few days ago, some sinus congestion two days ago. He has been taking Zyrtec, not much different than from Allegra.   Past Medical History  Diagnosis Date  . Alpha-1-antitrypsin deficiency   . Bronchiectasis   . Hyperlipidemia   . Chronic headaches   . OSA (obstructive sleep apnea)   . Cryptococcal pneumonitis 01-2013     Review of Systems  Constitutional: Negative for fever, chills, activity change,  appetite change and fatigue.  HENT: Negative for congestion, ear pain, hearing loss, postnasal drip, rhinorrhea, sinus pressure and sneezing.   Eyes: Negative for redness, itching and visual disturbance.  Respiratory: Positive for cough and shortness of breath. Negative for chest tightness and wheezing.   Cardiovascular: Negative for chest pain, palpitations and leg swelling.       Objective:   Physical Exam Filed Vitals:   05/20/15 1445  BP: 118/64  Pulse: 71  Temp: 98 F (36.7 C)  TempSrc: Oral  Height: 5\' 7"  (1.702 m)  Weight: 199 lb (90.266 kg)  SpO2: 96%   RA  Gen: Constant cough in clinic, no acute distress HEENT: NCAT, EOMi, OP clear PULM: wheezing and loud rhonchi throughout CV: RRR, no mgr, no JVD AB: BS+, soft, nontender,  Ext: warm, no edema, no clubbing, no cyanosis Derm: no rash or skin breakdown Neuro: A&Ox4, MAEW   November 2012 pulmonary function test at [facility] ratio 89%, FEV1 2.8 L (82% predicted, no change with bronchodilator) no lung volumes or DLCO performed October 2015 PFT > Ratio 87% FEV1 2.68 L (81% , 15% change with BD), TLC 5.25 L (86% pred), DLCO 22.2 (84% pred)  Respiratory culture results from [facility]: March 2014 cryptococcus July 2014 aspergillus [place] October 2014 aspergillus [place] October 2015 penicillium species October 2014 Actinomadura October 2014 Streptomyces January 2015 Actinomadura August 2015 Mycobacterium avium complex August 2015 Actinomadura October 2015 haemophilus species October  2015 "mold"  10/2014 Sputum culture > strep pneumo 10/2014 Sputum Fungal> fungus isolated, speciation pending 10/2014 Sputum AFB> smear negative, culture pending  01/2015 Sputum AFB > MAI    Assessment & Plan:   Bronchiectasis He is having another flare of his bronchiectasis. Common things being common this would most likely be a bacterial infection. However, I am concerned that he may have MAI infection given the fact that he grew out  Mycobacterium avium complex twice in the last year. Because his symptoms have only been lasting for a week or 2 I would prefer to wait before we commit him to 18 months of antibiotics therapy.  Plan: Treat bronchiectasis flare with Augmentin for 2 weeks Use probiotic while on antibiotics Sputum culture, fungal culture, AFB culture    Updated Medication List Outpatient Encounter Prescriptions as of 05/20/2015  Medication Sig  . acetaminophen (TYLENOL) 325 MG tablet Take 650 mg by mouth every 6 (six) hours as needed.  Marland Kitchen albuterol (PROVENTIL HFA) 108 (90 BASE) MCG/ACT inhaler Inhale 2 puffs into the lungs every 6 (six) hours as needed.    Marland Kitchen albuterol (PROVENTIL) (2.5 MG/3ML) 0.083% nebulizer solution 1 vial in nebulizer three times daily with sodium chloride   . Aztreonam Lysine (CAYSTON) 75 MG SOLR Inhale 75 mg into the lungs 3 (three) times daily. Use for 28 days, off for 28 days.  . budesonide-formoterol (SYMBICORT) 160-4.5 MCG/ACT inhaler Inhale 2 puffs into the lungs 2 (two) times daily.  . cetirizine (ZYRTEC) 10 MG tablet Take 10 mg by mouth daily.  . Cholecalciferol (VITAMIN D) 2000 UNITS CAPS Take 1 capsule by mouth daily.    . fluticasone (FLONASE) 50 MCG/ACT nasal spray Place 2 sprays into the nose daily.    . montelukast (SINGULAIR) 10 MG tablet Take 10 mg by mouth at bedtime.  Marland Kitchen nystatin (MYCOSTATIN) 100000 UNIT/ML suspension Take 5 mLs by mouth 4 (four) times daily.    Marland Kitchen omeprazole (PRILOSEC) 20 MG capsule Take 40 mg by mouth daily.  . rizatriptan (MAXALT) 10 MG tablet Take 10 mg by mouth as needed for migraine. May repeat in 2 hours if needed  . Sodium Chloride, Inhalant, 7 % NEBU Inhale 1 vial into the lungs 3 (three) times daily. With albuterol   . Sodium Chloride-Sodium Bicarb (AYR SALINE NASAL NETI RINSE NA) Use twice per day   . tiotropium (SPIRIVA) 18 MCG inhalation capsule Place 18 mcg into inhaler and inhale daily.  Marland Kitchen topiramate (TOPAMAX) 100 MG tablet Take 100 mg by  mouth daily.  . fexofenadine (ALLEGRA) 180 MG tablet Take 180 mg by mouth daily.  . [DISCONTINUED] predniSONE (DELTASONE) 10 MG tablet Take  po daily for 3 days, then take  po daily for 3 days, then take  po daily for two days, then take  po daily for 2 days (Patient not taking: Reported on 05/20/2015)   No facility-administered encounter medications on file as of 05/20/2015.

## 2015-05-20 NOTE — Addendum Note (Signed)
Addended by: Velvet Bathe on: 05/20/2015 03:50 PM   Modules accepted: Orders

## 2015-05-20 NOTE — Assessment & Plan Note (Signed)
He is having another flare of his bronchiectasis. Common things being common this would most likely be a bacterial infection. However, I am concerned that he may have MAI infection given the fact that he grew out Mycobacterium avium complex twice in the last year. Because his symptoms have only been lasting for a week or 2 I would prefer to wait before we commit him to 18 months of antibiotics therapy.  Plan: Treat bronchiectasis flare with Augmentin for 2 weeks Use probiotic while on antibiotics Sputum culture, fungal culture, AFB culture

## 2015-05-20 NOTE — Patient Instructions (Signed)
Please provide [place] with a sample of your mucus After you have given [place] the mucus specimen then start taking the antibiotics for 14 days Take the antibiotics with a probiotic such as yogurt Keep taking your inhaled therapies We will see you back in 6 weeks or sooner if needed

## 2015-05-21 ENCOUNTER — Other Ambulatory Visit: Payer: Self-pay | Admitting: *Deleted

## 2015-05-21 DIAGNOSIS — J470 Bronchiectasis with acute lower respiratory infection: Secondary | ICD-10-CM

## 2015-05-21 DIAGNOSIS — J471 Bronchiectasis with (acute) exacerbation: Secondary | ICD-10-CM

## 2015-05-21 DIAGNOSIS — A310 Pulmonary mycobacterial infection: Secondary | ICD-10-CM

## 2015-05-22 ENCOUNTER — Other Ambulatory Visit: Payer: Self-pay | Admitting: Pulmonary Disease

## 2015-05-22 ENCOUNTER — Telehealth: Payer: Self-pay | Admitting: Pulmonary Disease

## 2015-05-22 DIAGNOSIS — J029 Acute pharyngitis, unspecified: Secondary | ICD-10-CM

## 2015-05-22 DIAGNOSIS — R1310 Dysphagia, unspecified: Secondary | ICD-10-CM

## 2015-05-22 NOTE — Telephone Encounter (Signed)
Spoke with Jake Martinez, states he would like a referral to Dr. Pollyann Kennedy or another ENT doctor for his throat.  States he is still having trouble swallowing and a sore throat.  Jake Martinez is drinking warm liquids and taking otc anti-inflammatories.  Dr. Kendrick Fries are you ok with this referral?  Thanks!

## 2015-05-24 NOTE — Telephone Encounter (Signed)
Spoke to wife, states someone from our office called her and stated they could send referral somewhere else due to patient having [employer] insurance and Dr. Pollyann Kennedy office not accepting that type of insurance. I could not find this noted anywhere Please call pt wife back at [phone]

## 2015-05-24 NOTE — Telephone Encounter (Signed)
I'm OK with Pollyann Kennedy referral

## 2015-05-24 NOTE — Telephone Encounter (Signed)
Referral entered. Left message for wife to return call.

## 2015-05-24 NOTE — Telephone Encounter (Signed)
Pcc's did ya'll call bc I don't see anything documented about referral being faxed yet?

## 2015-05-24 NOTE — Telephone Encounter (Signed)
Referral put in for Dr. Margo Aye his office is under [facility], Referral put in for Dr. Narda Bonds he is affiliated with [facility] but not the [facility] program. I asked Marcelino Duster to change referral to Dr. Amanda Pea Teoh there office closed at 4:00 today I will have to call back on Monday. I left a message with the wife that Dr. Pollyann Kennedy would not accept the [facility] and for them to call me back and gave my # at [facility] desk.

## 2015-05-24 NOTE — Telephone Encounter (Signed)
Pt wife cb, [phone]

## 2015-05-27 NOTE — Telephone Encounter (Signed)
I called Dr. Avel Sensor office this morning and they had tried to get in contact with the patient this morning. With the [facility] the patient will have to be seen in their [place] office.

## 2015-05-27 NOTE — Telephone Encounter (Signed)
Order has been put in for Dr. Amanda Pea Teoh's office. Synetta Fail, can this TE be closed?

## 2015-06-06 ENCOUNTER — Ambulatory Visit: Payer: Self-pay | Admitting: Pulmonary Disease

## 2015-06-11 ENCOUNTER — Telehealth: Payer: Self-pay | Admitting: Pulmonary Disease

## 2015-06-11 ENCOUNTER — Ambulatory Visit (INDEPENDENT_AMBULATORY_CARE_PROVIDER_SITE_OTHER): Payer: Self-pay | Admitting: Psychiatry

## 2015-06-11 DIAGNOSIS — F064 Anxiety disorder due to known physiological condition: Secondary | ICD-10-CM

## 2015-06-11 NOTE — Telephone Encounter (Signed)
Pt and wife in lobby, dropped off patient assistance forms for pt's [place].  States this was originally prescribed by Dr. Garner Nash in [facility], but that BQ has asked them to bring this to him.  This is placed in BQ's look-at box.

## 2015-06-13 NOTE — Telephone Encounter (Signed)
Forms brought in by pt and wife had pt's other provider's info on it.  I've printed off new forms and filled out accordingly but pt will need to come in and sign these new forms.   Spoke with pt, he is aware that he needs to come in to sign these new forms.   Will hold message until these are signed by pt.

## 2015-06-14 NOTE — Telephone Encounter (Signed)
Pt came in this afternoon to sign new [place] forms.  These have been completely filled out and placed in BQ's look-at for signature.

## 2015-06-17 LAB — FUNGUS CULTURE W SMEAR: SMEAR RESULT: NONE SEEN

## 2015-06-17 NOTE — Telephone Encounter (Signed)
Forms in BQ's look at folder awaiting signature per Morrie Sheldon

## 2015-06-20 NOTE — Telephone Encounter (Signed)
Has this form been signed? Thanks.

## 2015-06-20 NOTE — Telephone Encounter (Signed)
This form has been signed and faxed.

## 2015-06-21 NOTE — Telephone Encounter (Signed)
Jake Martinez please advise if this message can be closed. Thanks.

## 2015-06-25 ENCOUNTER — Ambulatory Visit (INDEPENDENT_AMBULATORY_CARE_PROVIDER_SITE_OTHER): Payer: Self-pay | Admitting: Psychiatry

## 2015-06-25 DIAGNOSIS — F064 Anxiety disorder due to known physiological condition: Secondary | ICD-10-CM

## 2015-07-01 ENCOUNTER — Other Ambulatory Visit (INDEPENDENT_AMBULATORY_CARE_PROVIDER_SITE_OTHER): Payer: Self-pay

## 2015-07-01 ENCOUNTER — Encounter: Payer: Self-pay | Admitting: Pulmonary Disease

## 2015-07-01 ENCOUNTER — Ambulatory Visit (INDEPENDENT_AMBULATORY_CARE_PROVIDER_SITE_OTHER): Payer: Self-pay | Admitting: Pulmonary Disease

## 2015-07-01 VITALS — BP 110/60 | HR 72 | Ht 67.0 in | Wt 198.8 lb

## 2015-07-01 DIAGNOSIS — J471 Bronchiectasis with (acute) exacerbation: Secondary | ICD-10-CM

## 2015-07-01 DIAGNOSIS — A310 Pulmonary mycobacterial infection: Secondary | ICD-10-CM

## 2015-07-01 LAB — HEPATIC FUNCTION PANEL
ALT: 23 U/L (ref 0–53)
AST: 18 U/L (ref 0–37)
Albumin: 4.8 g/dL (ref 3.5–5.2)
Alkaline Phosphatase: 71 U/L (ref 39–117)
BILIRUBIN DIRECT: 0.1 mg/dL (ref 0.0–0.3)
BILIRUBIN TOTAL: 0.6 mg/dL (ref 0.2–1.2)
Total Protein: 7.5 g/dL (ref 6.0–8.3)

## 2015-07-01 MED ORDER — ETHAMBUTOL HCL 400 MG PO TABS: 2000.0000 mg | ORAL_TABLET | ORAL | Status: DC

## 2015-07-01 MED ORDER — CLARITHROMYCIN 250 MG PO TABS: 1000.0000 mg | ORAL_TABLET | ORAL | Status: DC

## 2015-07-01 MED ORDER — RIFAMPIN 300 MG PO CAPS: 600.0000 mg | ORAL_CAPSULE | ORAL | Status: DC

## 2015-07-01 NOTE — Assessment & Plan Note (Signed)
He has been experiencing ongoing wheezing, shortness of breath, and mucus production for the duration of this year. He says that his respiratory symptoms has been worse this year than in previous years. Interestingly, he only has mild bronchiectasis on chest CT imaging and he has no airflow obstruction. While his alpha-1 carrier state may contribute to his bronchiectasis it's not clear to me if he has an immunodeficiency. I personally reviewed the records from [facility] in his white blood cells were normal but he did have a slightly low IgM. I explained to him that never seen this causes respiratory problems and value probably needs to be repeated before we make any assumptions that he has a deficiency there. However, he continues to have worsening symptoms this year and so I think we need to finally treat his atypical mycobacterial disease.  Plan summary LFTs today and then quarterly Start triple therapy for atypical mycobacterial disease as detailed below Continue treatment for bronchiectasis Repeat serum immunoglobulin values today Follow-up 6-8 weeks  > 25 minutes spent with him today  We are going to treat you for an infection in your lungs called Mycobacterium Avium Complex (MAC).  This organism is related to tuberculosis (TB) but is NOT the same as TB.  It is a common cause of infection in people who have respiratory symptoms and an underlying lung disease like COPD or bronchiectasis.  Your treatment plan will consist of taking antibiotics for more than a year.  Specifically, you will take:  Clarithromycin (1000 mg three times per week)  Rifampin (600 mg three times per week) PLUS Ethambutol (25 mg/kg three times per week)  We will check your kidney function and liver function before starting the medication.  We will check your liver function monthly for the first few months.  Let us know immediately if you have any of the following symptoms after starting therapy:  Upset stomach or  Gastrointestinal symptoms (nausea, vomiting, diarrhea, cramping) Problems with your vision (including your ability to see colors) Changes in your vision or in your ability to see colors Changes in your hearing Numbness  We will treat you until the infection is no longer growing in your sputum.  So we will sample your mucus about every 2-3 months.    We will see you back in 2 months or sooner if needed

## 2015-07-01 NOTE — Progress Notes (Signed)
Subjective:    Patient ID: Jake Martinez, male    DOB: 1965-12-18, 49 y.o.   MRN: [id number]  Synopsis: Mr. Lothrop is an alpha-1 carrier who has mild bronchiectasis in the bases of his lungs. He was first evaluated by the [facility] pulmonary clinic in 2012. He then followed up with [facility] for the following 3 years. During that time he continued to have evidence of only mild airflow obstruction on pulmonary function testing. 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 He was treated with one year with fluconazole. He was followed by infectious diseases as well as pulmonary at [facility]. For some reason Roflumilast was attempted during that time and he was quite intolerant of this with a lot of back pain. During that time he was started on inhaled amikacin and he was continued on hypertonic saline. He would have periodic flareups treated with Augmentin. He transferred his care back to the [facility] pulmonary in 2015 for convenience because he lives in [place].  HPI Chief Complaint  Patient presents with  . Follow-up    Pt reports breathing is stable. C/o prod cough (pale green mixed with black/brown specks), wheezing all the time, chest tx. using rescue inhaler 2-3 times a day.     [name] took the augmentin last month and he said that it helped with his chest congestion towards the end of the treatment, he says that he started producing more mucus by the end.   He continues to have wheezing and coughing, some days the cough is worse than others.  Yesterday he was doing OK until after dinner when he started having more coughing spells.  This morning he had a bad coughing spells.  He says that day to day things vary quite a bit.  One week ago he had bad coughing spells all through the week.  He felt bad last week like he was run down, with a lot of fatigue.     Past Medical History  Diagnosis Date  . Alpha-1-antitrypsin deficiency   . Bronchiectasis   . Hyperlipidemia   .  Chronic headaches   . OSA (obstructive sleep apnea)   . Cryptococcal pneumonitis 01-2013     Review of Systems  Constitutional: Negative for fever, chills, activity change, appetite change and fatigue.  HENT: Negative for congestion, ear pain, hearing loss, postnasal drip, rhinorrhea, sinus pressure and sneezing.   Eyes: Negative for redness, itching and visual disturbance.  Respiratory: Positive for cough and shortness of breath. Negative for chest tightness and wheezing.   Cardiovascular: Negative for chest pain, palpitations and leg swelling.       Objective:   Physical Exam Filed Vitals:   07/01/15 1057  BP: 110/60  Pulse: 72  Height: 5\' 7"  (1.702 m)  Weight: 198 lb 12.8 oz (90.175 kg)  SpO2: 97%   RA  Gen:  no acute distress HEENT: NCAT, EOMi, OP clear PULM: wheezing and loud rhonchi throughout again today, some crackles R base CV: RRR, no mgr, no JVD AB: BS+, soft, nontender,  Ext: warm, no edema, no clubbing, no cyanosis Derm: no rash or skin breakdown Neuro: A&Ox4, MAEW   November 2012 pulmonary function test at [facility] ratio 89%, FEV1 2.8 L (82% predicted, no change with bronchodilator) no lung volumes or DLCO performed October 2015 PFT > Ratio 87% FEV1 2.68 L (81% , 15% change with BD), TLC 5.25 L (86% pred), DLCO 22.2 (84% pred)  Respiratory culture results from [facility]: March 2014  cryptococcus July 2014 aspergillus [place] October 2014 aspergillus [place] October 2015 penicillium species October 2014 Actinomadura October 2014 Streptomyces January 2015 Actinomadura August 2015 Mycobacterium avium complex August 2015 Actinomadura October 2015 haemophilus species October 2015 "mold"  10/2014 Sputum culture > strep pneumo 10/2014 Sputum Fungal> fungus isolated, speciation pending > not crypto 10/2014 Sputum AFB> negative  01/2015 AFB culture > MAI 01/2015 Fungal culture> negative  05/2015 AFB Culture > pending 05/2015 fungal culture > candida    Assessment &  Plan:   Bronchiectasis He has been experiencing ongoing wheezing, shortness of breath, and mucus production for the duration of this year. He says that his respiratory symptoms has been worse this year than in previous years. Interestingly, he only has mild bronchiectasis on chest CT imaging and he has no airflow obstruction. While his alpha-1 carrier state may contribute to his bronchiectasis it's not clear to me if he has an immunodeficiency. I personally reviewed the records from [facility] in his white blood cells were normal but he did have a slightly low IgM. I explained to him that never seen this causes respiratory problems and value probably needs to be repeated before we make any assumptions that he has a deficiency there. However, he continues to have worsening symptoms this year and so I think we need to finally treat his atypical mycobacterial disease.  Plan summary LFTs today and then quarterly Start triple therapy for atypical mycobacterial disease as detailed below Continue treatment for bronchiectasis Repeat serum immunoglobulin values today Follow-up 6-8 weeks  > 25 minutes spent with him today  We are going to treat you for an infection in your lungs called Mycobacterium Avium Complex (MAC).  This organism is related to tuberculosis (TB) but is NOT the same as TB.  It is a common cause of infection in people who have respiratory symptoms and an underlying lung disease like COPD or bronchiectasis.  Your treatment plan will consist of taking antibiotics for more than a year.  Specifically, you will take:  Clarithromycin (1000 mg three times per week)  Rifampin (600 mg three times per week) PLUS Ethambutol (25 mg/kg three times per week)  We will check your kidney function and liver function before starting the medication.  We will check your liver function monthly for the first few months.  Let us know immediately if you have any of the following symptoms after starting  therapy:  Upset stomach or Gastrointestinal symptoms (nausea, vomiting, diarrhea, cramping) Problems with your vision (including your ability to see colors) Changes in your vision or in your ability to see colors Changes in your hearing Numbness  We will treat you until the infection is no longer growing in your sputum.  So we will sample your mucus about every 2-3 months.    We will see you back in 2 months or sooner if needed     Updated Medication List Outpatient Encounter Prescriptions as of 07/01/2015  Medication Sig  . acetaminophen (TYLENOL) 325 MG tablet Take 650 mg by mouth every 6 (six) hours as needed.  Marland Kitchen albuterol (PROVENTIL HFA) 108 (90 BASE) MCG/ACT inhaler Inhale 2 puffs into the lungs every 6 (six) hours as needed.    Marland Kitchen albuterol (PROVENTIL) (2.5 MG/3ML) 0.083% nebulizer solution 1 vial in nebulizer three times daily with sodium chloride   . Aztreonam Lysine (CAYSTON) 75 MG SOLR Inhale 75 mg into the lungs 3 (three) times daily. Use for 28 days, off for 28 days.  . budesonide-formoterol (SYMBICORT) 160-4.5 MCG/ACT inhaler  Inhale 2 puffs into the lungs 2 (two) times daily.  . cetirizine (ZYRTEC) 10 MG tablet Take 10 mg by mouth daily. Alternates with allegra 180  . Cholecalciferol (VITAMIN D) 2000 UNITS CAPS Take 1 capsule by mouth daily.    . fluticasone (FLONASE) 50 MCG/ACT nasal spray Place 2 sprays into the nose daily.    . montelukast (SINGULAIR) 10 MG tablet Take 10 mg by mouth at bedtime.  Marland Kitchen nystatin (MYCOSTATIN) 100000 UNIT/ML suspension Take 5 mLs by mouth as needed.   Marland Kitchen omeprazole (PRILOSEC) 20 MG capsule Take 40 mg by mouth daily.  . rizatriptan (MAXALT) 10 MG tablet Take 10 mg by mouth as needed for migraine. May repeat in 2 hours if needed  . Sodium Chloride, Inhalant, 7 % NEBU Inhale 1 vial into the lungs 3 (three) times daily. With albuterol   . Sodium Chloride-Sodium Bicarb (AYR SALINE NASAL NETI RINSE NA) Use twice per day   . tiotropium (SPIRIVA) 18 MCG  inhalation capsule Place 18 mcg into inhaler and inhale daily.  Marland Kitchen topiramate (TOPAMAX) 100 MG tablet Take 100 mg by mouth daily.  . [DISCONTINUED] fexofenadine (ALLEGRA) 180 MG tablet Take 180 mg by mouth daily.   No facility-administered encounter medications on file as of 07/01/2015.

## 2015-07-01 NOTE — Patient Instructions (Signed)
Keep taking all of your medications as you are doing  We are going to treat you for an infection in your lungs called Mycobacterium Avium Complex (MAC).  This organism is related to tuberculosis (TB) but is NOT the same as TB.  It is a common cause of infection in people who have respiratory symptoms and an underlying lung disease like COPD or bronchiectasis.  Your treatment plan will consist of taking antibiotics for more than a year.  Specifically, you will take:  Clarithromycin (1000 mg three times per week)  Rifampin (600 mg three times per week) PLUS Ethambutol (25 mg/kg three times per week)  We will check your kidney function and liver function before starting the medication.  We will check your liver function monthly for the first few months.  Let us know immediately if you have any of the following symptoms after starting therapy:  Upset stomach or Gastrointestinal symptoms (nausea, vomiting, diarrhea, cramping) Problems with your vision (including your ability to see colors) Changes in your vision or in your ability to see colors Changes in your hearing Numbness  We will treat you until the infection is no longer growing in your sputum.  So we will sample your mucus about every 2-3 months.    We will see you back in 6-8 weeks or sooner if needed

## 2015-07-02 LAB — IGG, IGA, IGM
IGA: 52 mg/dL — AB (ref 68–379)
IgG (Immunoglobin G), Serum: 922 mg/dL (ref 650–1600)
IgM, Serum: 37 mg/dL — ABNORMAL LOW (ref 41–251)

## 2015-07-02 LAB — IGE: IgE (Immunoglobulin E), Serum: 34 kU/L (ref <115)

## 2015-07-04 ENCOUNTER — Ambulatory Visit (INDEPENDENT_AMBULATORY_CARE_PROVIDER_SITE_OTHER): Payer: Self-pay | Admitting: Otolaryngology

## 2015-07-04 DIAGNOSIS — R490 Dysphonia: Secondary | ICD-10-CM

## 2015-07-04 DIAGNOSIS — K219 Gastro-esophageal reflux disease without esophagitis: Secondary | ICD-10-CM

## 2015-07-07 LAB — AFB CULTURE WITH SMEAR (NOT AT ARMC): Acid Fast Smear: NONE SEEN

## 2015-07-09 ENCOUNTER — Telehealth: Payer: Self-pay | Admitting: Pulmonary Disease

## 2015-07-09 NOTE — Telephone Encounter (Signed)
Spoke with Jake Martinez- states that BQ and Morrie Sheldon should still have the original forms for patient assistance on [place] medication for patient. Jake Martinez states the pt assistance program needs to have BQ mark through the original prescribing MD and write/sign his name and re-fax forms to [phone]. The program already has patients financial information so we do not need to complete that section of form if we have to use a new from-printed Pt assistance forms and given to [place].

## 2015-07-10 NOTE — Telephone Encounter (Signed)
Jake Sheldon do you have these forms for BQ to sign his name?  thanks

## 2015-07-10 NOTE — Telephone Encounter (Signed)
OK by me Will do this

## 2015-07-12 NOTE — Telephone Encounter (Signed)
I still have the original forms, these will be placed in BQ's look-at folder to sign.

## 2015-07-15 NOTE — Telephone Encounter (Signed)
Per Morrie Sheldon - in BQ's look at folder awaiting signature

## 2015-07-15 NOTE — Telephone Encounter (Signed)
?   Patient's [place] forms were faxed to [facility] on 06/18/15 with a confirmation fax received. Pt was started on this program through another provider, and BQ has agreed to take over med mgmt.  The original forms with another provider were sent as well as a new (updated) set of forms with BQ's info on it.    Called [employer] at [phone] and spoke with Helmut Muster, states that an application form was received, but the original form with the former provider's info needed to be crossed out and filled in with Bq's info.  Pt also needs a letter of medical necessity for this medication.   Forms have been updated and placed in BQ's look-at folder to sign when he returns to office tomorrow.    BQ please advise on what pt's letter of medical necessity needs to include as I'm unfamiliar with this medication.  Thanks!

## 2015-07-15 NOTE — Telephone Encounter (Signed)
Pt was told that the papers have to be turned in before 09/27 or they will drop them [place], [place] was calling back

## 2015-07-15 NOTE — Telephone Encounter (Signed)
Called and spoke with pt 's wife Lupita Leash Wife stated that she was contacted and informed that pt was denied coverage due to paperwork not be turned in on time Wife requested that message be sen to BQ's nurse about this to see what can be done to get coverage  Fowarding to Morrie Sheldon to see what can be done. Please advise

## 2015-07-15 NOTE — Telephone Encounter (Signed)
Wife said she read it wrong the termed the pt as of 08/27 [phone]

## 2015-07-16 NOTE — Telephone Encounter (Signed)
We need to indicate that he has bronchiectasis and chronic pseudomonas respiratory infection

## 2015-07-16 NOTE — Telephone Encounter (Signed)
Morrie Sheldon, do you have the medical necessasity form?

## 2015-07-17 NOTE — Telephone Encounter (Signed)
I do not.  I did not work with BQ yesterday as I was covering allergy, and have not yet received any forms back from him.

## 2015-07-18 NOTE — Telephone Encounter (Signed)
Letter has been typed, forms have been signed and received, and all paperwork has been faxed back to [place] assistance.  Will hold in my inbasket until application has been approved.

## 2015-07-29 NOTE — Telephone Encounter (Signed)
Have we heard back from [facility]? Thanks.

## 2015-07-30 ENCOUNTER — Telehealth: Payer: Self-pay | Admitting: Pulmonary Disease

## 2015-07-30 ENCOUNTER — Other Ambulatory Visit: Payer: Self-pay | Admitting: *Deleted

## 2015-07-30 DIAGNOSIS — J470 Bronchiectasis with acute lower respiratory infection: Secondary | ICD-10-CM

## 2015-07-30 NOTE — Telephone Encounter (Signed)
[place] requested proof of address of patient (ie: driver's license).  This was faxed yesterday.  Still waiting to hear from approval

## 2015-07-30 NOTE — Telephone Encounter (Signed)
Spoke with pt and he reports that he already dropped sample off at lab at [facility] in [place] and they were gonna take care of it.  Nothing further needed.

## 2015-08-01 NOTE — Telephone Encounter (Signed)
C[place] has been approved through 07/30/2016.  rx needs to be printed and faxed to [phone].     Per pt's med list, states pt uses [place]- Inhale  tid-use for 28 days, remain off for 28 days.  As this is a new start for BQ, please advise if this is the correct dosage, and on how many refills can be sent.  Thanks!

## 2015-08-02 MED ORDER — AZTREONAM LYSINE 75 MG IN SOLR
75.0000 mg | Freq: Three times a day (TID) | RESPIRATORY_TRACT | Status: DC
Start: 2015-08-02 — End: 2016-08-11

## 2015-08-02 NOTE — Telephone Encounter (Signed)
correct 

## 2015-08-02 NOTE — Telephone Encounter (Signed)
Rx printed and placed in BQ's lookat cuuby to be signed  Will forward to [facility] for f/u on this, thanks!

## 2015-08-05 NOTE — Telephone Encounter (Signed)
Received call from I don't know if it was pt daughter or what, but where calling stating that pt assist was approved for pt meds and that a script needed to be faxed to [phone] .[name]

## 2015-08-06 NOTE — Telephone Encounter (Signed)
Spoke with pt, aware that rx will be signed and faxed as soon as BQ returns to clinic.

## 2015-08-07 ENCOUNTER — Telehealth: Payer: Self-pay | Admitting: Pulmonary Disease

## 2015-08-07 NOTE — Telephone Encounter (Signed)
Spoke with pt's wife. Advised her that we are still waiting on BQ's signature for this prescription. She verbalized understanding.

## 2015-08-07 NOTE — Telephone Encounter (Signed)
Pt wife calling back w/fax # to pharm [phone].[name]

## 2015-08-08 ENCOUNTER — Telehealth: Payer: Self-pay | Admitting: Pulmonary Disease

## 2015-08-08 NOTE — Telephone Encounter (Signed)
lmtcb x1 

## 2015-08-09 NOTE — Telephone Encounter (Signed)
Called and spoke to Diane, Teacher, early years/pre. Diane question the quantity and refills for the [place]. VO given to Diane for  TID for #84 capsules for one month and 6 refills to last one year. Diane verbalized understanding and denied any further questions or concerns at this time.

## 2015-08-12 NOTE — Telephone Encounter (Signed)
rx has been called in to [facility].  Nothing further needed.

## 2015-08-12 NOTE — Telephone Encounter (Signed)
Morrie Sheldon, has this been received? thanks

## 2015-08-14 ENCOUNTER — Ambulatory Visit (INDEPENDENT_AMBULATORY_CARE_PROVIDER_SITE_OTHER): Payer: Self-pay | Admitting: Infectious Diseases

## 2015-08-14 ENCOUNTER — Encounter: Payer: Self-pay | Admitting: Infectious Diseases

## 2015-08-14 VITALS — BP 132/86 | HR 59 | Temp 97.7°F | Ht 67.0 in | Wt 200.0 lb

## 2015-08-14 DIAGNOSIS — A310 Pulmonary mycobacterial infection: Secondary | ICD-10-CM

## 2015-08-14 DIAGNOSIS — Z23 Encounter for immunization: Secondary | ICD-10-CM

## 2015-08-14 DIAGNOSIS — J471 Bronchiectasis with (acute) exacerbation: Secondary | ICD-10-CM

## 2015-08-14 LAB — CBC WITH DIFFERENTIAL/PLATELET
BASOS ABS: 0.1 10*3/uL (ref 0.0–0.1)
BASOS PCT: 1 % (ref 0–1)
Eosinophils Absolute: 0.2 10*3/uL (ref 0.0–0.7)
Eosinophils Relative: 4 % (ref 0–5)
HCT: 46.4 % (ref 39.0–52.0)
HEMOGLOBIN: 15.9 g/dL (ref 13.0–17.0)
LYMPHS PCT: 16 % (ref 12–46)
Lymphs Abs: 0.8 10*3/uL (ref 0.7–4.0)
MCH: 29.2 pg (ref 26.0–34.0)
MCHC: 34.3 g/dL (ref 30.0–36.0)
MCV: 85.1 fL (ref 78.0–100.0)
MONO ABS: 0.7 10*3/uL (ref 0.1–1.0)
MPV: 10 fL (ref 8.6–12.4)
Monocytes Relative: 13 % — ABNORMAL HIGH (ref 3–12)
NEUTROS ABS: 3.4 10*3/uL (ref 1.7–7.7)
NEUTROS PCT: 66 % (ref 43–77)
PLATELETS: 199 10*3/uL (ref 150–400)
RBC: 5.45 MIL/uL (ref 4.22–5.81)
RDW: 14.4 % (ref 11.5–15.5)
WBC: 5.2 10*3/uL (ref 4.0–10.5)

## 2015-08-14 LAB — COMPREHENSIVE METABOLIC PANEL
ALBUMIN: 4.7 g/dL (ref 3.6–5.1)
ALT: 24 U/L (ref 9–46)
AST: 21 U/L (ref 10–35)
Alkaline Phosphatase: 78 U/L (ref 40–115)
BUN: 18 mg/dL (ref 7–25)
CHLORIDE: 107 mmol/L (ref 98–110)
CO2: 23 mmol/L (ref 20–31)
CREATININE: 1.11 mg/dL (ref 0.70–1.33)
Calcium: 9.3 mg/dL (ref 8.6–10.3)
GLUCOSE: 106 mg/dL — AB (ref 65–99)
Potassium: 4.2 mmol/L (ref 3.5–5.3)
SODIUM: 138 mmol/L (ref 135–146)
Total Bilirubin: 0.4 mg/dL (ref 0.2–1.2)
Total Protein: 7 g/dL (ref 6.1–8.1)

## 2015-08-14 NOTE — Assessment & Plan Note (Addendum)
He is having some expected side effects from the meds- biaxin- gi upset, metalic taste, rifampin- gi upset.  Will check his LFTs, CBC Will give him flu shot today Have encouraged him to try to stay on meds as long as he can.  He will see ophtho through the [facility] network.  Will see him 3 months.

## 2015-08-14 NOTE — Assessment & Plan Note (Signed)
He has minimal improvement so far.  Appreciate partnering with Dr Kendrick Fries.

## 2015-08-14 NOTE — Progress Notes (Signed)
   Subjective:    Patient ID: Jake Martinez, male    DOB: 03-Jan-1966, 49 y.o.   MRN: [id number]  HPI 49 yo M with hx of alpha 1 anti-trypsin carrier, bronchiectasis (mild) who has had previous cryptococcal PNA treated at [facility] 01-2013, treated with fluconazole for 1 year. His LP was (-) for crypto.  He has periodic flares of his bronchiectasis, treated with augmentin.  He was seen 01-28-15 at pulmonary with worsening cough and foul smelling sputum. He was given a course of augmentin. He had repeat sputum cx, afb, fungus that are all negative to date.  He had a repeat CT scan ~ 2 weeks ago. He states that "they weren't able to find anything".  He states he wheezes a lot (about the same). He has frequent coughing spells, when mucus breaks up from his lungs. Occas dry cough. Sputum is green/pale/clear. Foul tasting.  Has been using NaCl nebulizers to help break up his sputum. Also doing amikacin daily for 1 month (then skips a month).  States he has previously had aspergillus, pseudomonas, "black mold".  Resp Cx [facility]) 06-26-14: actinomadura, MAI Rev Cx showed aspergillus, penicillium, actinomadura, streptomyces, haemophilus.(see [facility] Cx in care everywhere).  He was seen in ID on 4-11 and felt to be stable. He was deferred from starting anti-MAI tx.  He was seen in pulm on 8-29 and started on E/R/Biaxin. Taking MWF. Feels like his breathing is a little worse while on tx. More cough, more wheeze.  Still having productive cough- tan, green, pale. No blood.  Has occas pain in R lung, "like when it collapsed". Not as bad as previous. Pleuritic.  Has had to use rescue inhaler due to tightness in breathing.  Has had soreness on bottom of feet since on anbx. Don't feel like they are asleep. Improves with walking.    Review of Systems  Constitutional: Negative for appetite change and unexpected weight change.  Eyes: Positive for visual disturbance.  Respiratory: Positive for cough and  shortness of breath.   Cardiovascular: Positive for chest pain. Negative for leg swelling.  Gastrointestinal: Positive for diarrhea. Negative for constipation.  Genitourinary: Negative for difficulty urinating.  some bluriness of vision when taking anbx (watery).      Objective:   Physical Exam  Constitutional: He appears well-developed and well-nourished.  HENT:  Mouth/Throat: No oropharyngeal exudate.  Eyes: EOM are normal. Pupils are equal, round, and reactive to light.  Neck: Neck supple.  Cardiovascular: Normal rate, regular rhythm and normal heart sounds.   Pulmonary/Chest: Effort normal. He has rhonchi.  Abdominal: Soft. Bowel sounds are normal. There is no tenderness. There is no rebound.  Musculoskeletal: He exhibits no edema.  Lymphadenopathy:    He has no cervical adenopathy.       Assessment & Plan:

## 2015-08-26 LAB — FUNGUS CULTURE W SMEAR: SMEAR RESULT: NONE SEEN

## 2015-08-29 ENCOUNTER — Ambulatory Visit (INDEPENDENT_AMBULATORY_CARE_PROVIDER_SITE_OTHER)
Admission: RE | Admit: 2015-08-29 | Discharge: 2015-08-29 | Disposition: A | Discharge status: Home / Self Care | Payer: Self-pay | Source: Ambulatory Visit | Attending: Pulmonary Disease | Admitting: Pulmonary Disease

## 2015-08-29 ENCOUNTER — Ambulatory Visit (INDEPENDENT_AMBULATORY_CARE_PROVIDER_SITE_OTHER): Payer: Self-pay | Admitting: Pulmonary Disease

## 2015-08-29 ENCOUNTER — Encounter: Payer: Self-pay | Admitting: Pulmonary Disease

## 2015-08-29 VITALS — BP 118/72 | HR 68

## 2015-08-29 DIAGNOSIS — R059 Cough, unspecified: Secondary | ICD-10-CM

## 2015-08-29 DIAGNOSIS — K219 Gastro-esophageal reflux disease without esophagitis: Secondary | ICD-10-CM | POA: Insufficient documentation

## 2015-08-29 DIAGNOSIS — A310 Pulmonary mycobacterial infection: Secondary | ICD-10-CM

## 2015-08-29 DIAGNOSIS — R05 Cough: Secondary | ICD-10-CM

## 2015-08-29 DIAGNOSIS — J471 Bronchiectasis with (acute) exacerbation: Secondary | ICD-10-CM

## 2015-08-29 MED ORDER — PREDNISONE 10 MG PO TABS: 20.0000 mg | ORAL_TABLET | Freq: Every day | ORAL | Status: AC

## 2015-08-29 NOTE — Progress Notes (Signed)
Subjective:    Patient ID: Jake Martinez, male    DOB: 1966/09/03, 49 y.o.   MRN: [id number]  Synopsis: Jake Martinez is an alpha-1 carrier who has mild bronchiectasis in the bases of his lungs. He was first evaluated by the [facility] pulmonary clinic in 2012. He then followed up with [facility] for the following 3 years. During that time he continued to have evidence of only mild airflow obstruction on pulmonary function testing. 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 He was treated with one year with fluconazole. He was followed by infectious diseases as well as pulmonary at [facility]. For some reason Roflumilast was attempted during that time and he was quite intolerant of this with a lot of back pain. During that time he was started on inhaled amikacin and he was continued on hypertonic saline. He would have periodic flareups treated with Augmentin. He transferred his care back to the [place] pulmonary in 2015 for convenience because he lives in [place]. He had Mycobacterium avium intracellular grow out of his mucus twice in 2015 2016 so he was started on therapy again in August 2016.  HPI Chief Complaint  Patient presents with  . Follow-up    Pt c/o of recent increase in cough with tan mucus that has a bad taste, wheezing, SOB and CP/tightness. Pt states that albuterol HFA does help and he has been using it 4-5xs/day.  Jake Martinez doesn't think that he has improved much He is still wheezing and coughing all the time. He says that the cough stays with him constantly.  He says that some days are worse than others.   He feels that he wheezes all the time. He is coughing up mucus, tan in color. Coughing all the time. He continues to take the three drug therapy (antibioctics) He has a chest tightness nearly all the time, it feels like a tightness. It comes and goes a lot.  No correlation with coughing.  It is a substernal central chest pain which he says is mild to moderate.    He continues to take the Symbicort and Spiriva. He denies feeling heart burn or acid reflux.   No fevers.   Past Medical History  Diagnosis Date  . Alpha-1-antitrypsin deficiency (HCC)   . Bronchiectasis   . Hyperlipidemia   . Chronic headaches   . OSA (obstructive sleep apnea)   . Cryptococcal pneumonitis (HCC) 01-2013     Review of Systems  Constitutional: Negative for fever, chills, activity change, appetite change and fatigue.  HENT: Negative for congestion, ear pain, hearing loss, postnasal drip, rhinorrhea, sinus pressure and sneezing.   Eyes: Negative for redness, itching and visual disturbance.  Respiratory: Positive for cough and shortness of breath. Negative for chest tightness and wheezing.   Cardiovascular: Negative for chest pain, palpitations and leg swelling.       Objective:   Physical Exam Filed Vitals:   08/29/15 1009  BP: 118/72  Pulse: 68  SpO2: 97%   RA  Gen:  no acute distress HEENT: NCAT, EOMi, OP clear PULM: wheezing and loud rhonchi throughout again today, some crackles R base CV: RRR, no mgr, no JVD AB: BS+, soft, nontender,  Ext: warm, no edema, no clubbing, no cyanosis Derm: no rash or skin breakdown Neuro: A&Ox4, MAEW   November 2012 pulmonary function test at [facility] ratio 89%, FEV1 2.8 L (82% predicted, no change with bronchodilator) no lung volumes or DLCO performed October 2015 PFT > Ratio 87%  FEV1 2.68 L (81% , 15% change with BD), TLC 5.25 L (86% pred), DLCO 22.2 (84% pred)  Respiratory culture results from [facility]: March 2014 cryptococcus July 2014 aspergillus [place] October 2014 aspergillus [place] October 2015 penicillium species October 2014 Actinomadura October 2014 Streptomyces January 2015 Actinomadura August 2015 Mycobacterium avium complex August 2015 Actinomadura October 2015 haemophilus species October 2015 "mold"  10/2014 Sputum culture > strep pneumo 10/2014 Sputum Fungal> fungus isolated, speciation pending > not  crypto 10/2014 Sputum AFB> negative  01/2015 AFB culture > MAI 01/2015 Fungal culture> negative  05/2015 AFB Culture > pending 05/2015 fungal culture > candida  Notes from Dr. Ninetta Lights were reviewed today where he was monitored for 3 drug therapy, LFTs were collected and were normal.     Assessment & Plan:   Bronchiectasis He continues to struggle with cough and wheeze, but its really hard to tell if this is coming from his bronchiectasis or not. Specifically because his PFTs have been stable and his main complain is wheezing and not overt mucus production.  He is currently on very aggressive therapy for this yet continues to struggle with symptoms.  He does have asthmatic features as he has a positive bronchodiator response on PFT.   He appears to have a flare of his chronic bronchiectasis today with wheezing today, again I think this is primarily and asthma-like problem rather than a flare of a chronic infection.  Plan: Continue treatment for bronchiectasis with Symbicort, Spiriva, Cayston every other month, and hypertonic saline Continue MAI teatment Brief course of prednisone today for wheezing (asthma flare?) Focus on acid reflux  MAI (mycobacterium avium-intracellulare) He has been tolerating the treatment for MAI started in 06/2015 without difficulty. I think it is to early to see if his symptoms are going to respond to AFB treatment as he has only been on treatment for three months.  Plan: LFT Continue three drug therapy Check sputum AFB  GERD (gastroesophageal reflux disease) I believe that his acid reflux is contributing significantly to the upper airway cough and wheeze that I hear. He is taking antacid therapy and I'm reluctant to increase the dose right now because of his antibiotic therapy. So today we spent time talking about acid reflux lifestyle modification specifically foods he can avoid and I encouraged him to prop up the head of his bed.    Updated Medication  List Outpatient Encounter Prescriptions as of 08/29/2015  Medication Sig  . acetaminophen (TYLENOL) 325 MG tablet Take 650 mg by mouth every 6 (six) hours as needed.  Marland Kitchen albuterol (PROVENTIL HFA) 108 (90 BASE) MCG/ACT inhaler Inhale 2 puffs into the lungs every 6 (six) hours as needed.    Marland Kitchen albuterol (PROVENTIL) (2.5 MG/3ML) 0.083% nebulizer solution 1 vial in nebulizer three times daily with sodium chloride   . Aztreonam Lysine (CAYSTON) 75 MG SOLR Inhale 75 mg into the lungs 3 (three) times daily. Use for 28 days, off for 28 days.  . Aztreonam Lysine 75 MG SOLR Inhale 75 mg into the lungs 3 (three) times daily.  . budesonide-formoterol (SYMBICORT) 160-4.5 MCG/ACT inhaler Inhale 2 puffs into the lungs 2 (two) times daily.  . cetirizine (ZYRTEC) 10 MG tablet Take 10 mg by mouth daily. Alternates with allegra 180  . Cholecalciferol (VITAMIN D) 2000 UNITS CAPS Take 1 capsule by mouth daily.    . clarithromycin (BIAXIN) 250 MG tablet Take 4 tablets (1,000 mg total) by mouth every Monday, Wednesday, and Friday.  . ethambutol (MYAMBUTOL) 400 MG tablet Take  5 tablets (2,000 mg total) by mouth every Monday, Wednesday, and Friday.  . fluticasone (FLONASE) 50 MCG/ACT nasal spray Place 2 sprays into the nose daily.    . montelukast (SINGULAIR) 10 MG tablet Take 10 mg by mouth at bedtime.  Marland Kitchen nystatin (MYCOSTATIN) 100000 UNIT/ML suspension Take 5 mLs by mouth as needed.   Marland Kitchen omeprazole (PRILOSEC) 20 MG capsule Take 40 mg by mouth daily.  . rifampin (RIFADIN) 300 MG capsule Take 2 capsules (600 mg total) by mouth every Monday, Wednesday, and Friday.  . rizatriptan (MAXALT) 10 MG tablet Take 10 mg by mouth as needed for migraine. May repeat in 2 hours if needed  . Sodium Chloride, Inhalant, 7 % NEBU Inhale 1 vial into the lungs 3 (three) times daily. With albuterol   . Sodium Chloride-Sodium Bicarb (AYR SALINE NASAL NETI RINSE NA) Use twice per day   . tiotropium (SPIRIVA) 18 MCG inhalation capsule Place 18 mcg  into inhaler and inhale daily.  Marland Kitchen topiramate (TOPAMAX) 100 MG tablet Take 100 mg by mouth daily.  . predniSONE (DELTASONE) 10 MG tablet Take 2 tablets (20 mg total) by mouth daily with breakfast.   No facility-administered encounter medications on file as of 08/29/2015.

## 2015-08-29 NOTE — Assessment & Plan Note (Signed)
I believe that his acid reflux is contributing significantly to the upper airway cough and wheeze that I hear. He is taking antacid therapy and I'm reluctant to increase the dose right now because of his antibiotic therapy. So today we spent time talking about acid reflux lifestyle modification specifically foods he can avoid and I encouraged him to prop up the head of his bed.

## 2015-08-29 NOTE — Patient Instructions (Signed)
Take the prednisone as prescribed Keep taking your inhaled medicines as you're doing Follow the acid reflux lifestyle modification treatment we recommended today We will see you back in 3 months or sooner if needed

## 2015-08-29 NOTE — Assessment & Plan Note (Addendum)
He continues to struggle with cough and wheeze, but its really hard to tell if this is coming from his bronchiectasis or not. Specifically because his PFTs have been stable and his main complain is wheezing and not overt mucus production.  He is currently on very aggressive therapy for this yet continues to struggle with symptoms.  He does have asthmatic features as he has a positive bronchodiator response on PFT.   He appears to have a flare of his chronic bronchiectasis today with wheezing today, again I think this is primarily and asthma-like problem rather than a flare of a chronic infection.  Plan: Continue treatment for bronchiectasis with Symbicort, Spiriva, Cayston every other month, and hypertonic saline Continue MAI teatment Brief course of prednisone today for wheezing (asthma flare?) Focus on acid reflux

## 2015-08-29 NOTE — Assessment & Plan Note (Signed)
He has been tolerating the treatment for MAI started in 06/2015 without difficulty. I think it is to early to see if his symptoms are going to respond to AFB treatment as he has only been on treatment for three months.  Plan: LFT Continue three drug therapy Check sputum AFB

## 2015-09-11 ENCOUNTER — Other Ambulatory Visit: Payer: Self-pay

## 2015-09-11 ENCOUNTER — Other Ambulatory Visit: Payer: Self-pay | Admitting: Pulmonary Disease

## 2015-09-14 LAB — RESPIRATORY CULTURE OR RESPIRATORY AND SPUTUM CULTURE
GRAM STAIN: NONE SEEN
Gram Stain: NONE SEEN
Gram Stain: NONE SEEN
Organism ID, Bacteria: NORMAL

## 2015-10-03 ENCOUNTER — Telehealth: Payer: Self-pay | Admitting: Pulmonary Disease

## 2015-10-03 NOTE — Telephone Encounter (Signed)
Pt calling bc he thinks he has a pinched nerve in back or ruptured disc and is requesting appt. I advised pt he would either need to contact PCP or go to UC. He will do so. Nothing further needed

## 2015-10-04 ENCOUNTER — Telehealth: Payer: Self-pay | Admitting: Pulmonary Disease

## 2015-10-04 MED ORDER — RIFAMPIN 300 MG PO CAPS: 600.0000 mg | ORAL_CAPSULE | ORAL | Status: DC

## 2015-10-04 MED ORDER — CLARITHROMYCIN 250 MG PO TABS: 1000.0000 mg | ORAL_TABLET | ORAL | Status: DC

## 2015-10-04 MED ORDER — ETHAMBUTOL HCL 400 MG PO TABS: 2000.0000 mg | ORAL_TABLET | ORAL | Status: DC

## 2015-10-04 NOTE — Telephone Encounter (Signed)
Pt wife aware that all 3 abx are being sent [facility] with 90-day supply. Nothing further needed.

## 2015-10-04 NOTE — Telephone Encounter (Signed)
Pt needing refills of 3 abx for MAI : Clarithromycin 250mg , Ethambutal 400mg , Rifampin 300mg  Requesting that this be sent in as a 90-day supply instead of 30-day. Pt has been having to call monthly to get this refilled and would like to go to every 3 months.  Pt gets it through a mail order service which delivers : [facility] in [place]  Please advise Dr Kendrick Fries if you are okay with doing this refill as a 90-day. Thanks.

## 2015-10-04 NOTE — Telephone Encounter (Signed)
Yes, OK by me 

## 2015-10-04 NOTE — Telephone Encounter (Signed)
lmomtcb x1 

## 2015-10-07 LAB — FUNGUS CULTURE W SMEAR: SMEAR RESULT: NONE SEEN

## 2015-10-31 LAB — AFB CULTURE WITH SMEAR (NOT AT ARMC): Acid Fast Smear: NONE SEEN

## 2015-11-11 IMAGING — CR DG CHEST 2V
2 series · 2 of 2 positions shown · non-contrast
Comparison: 09/23/2011

CLINICAL DATA: Upper chest pain radiating right to left when
walking, cough, shortness of breath

EXAM:
CHEST  2 VIEW

[view not recorded (1 of 2)]
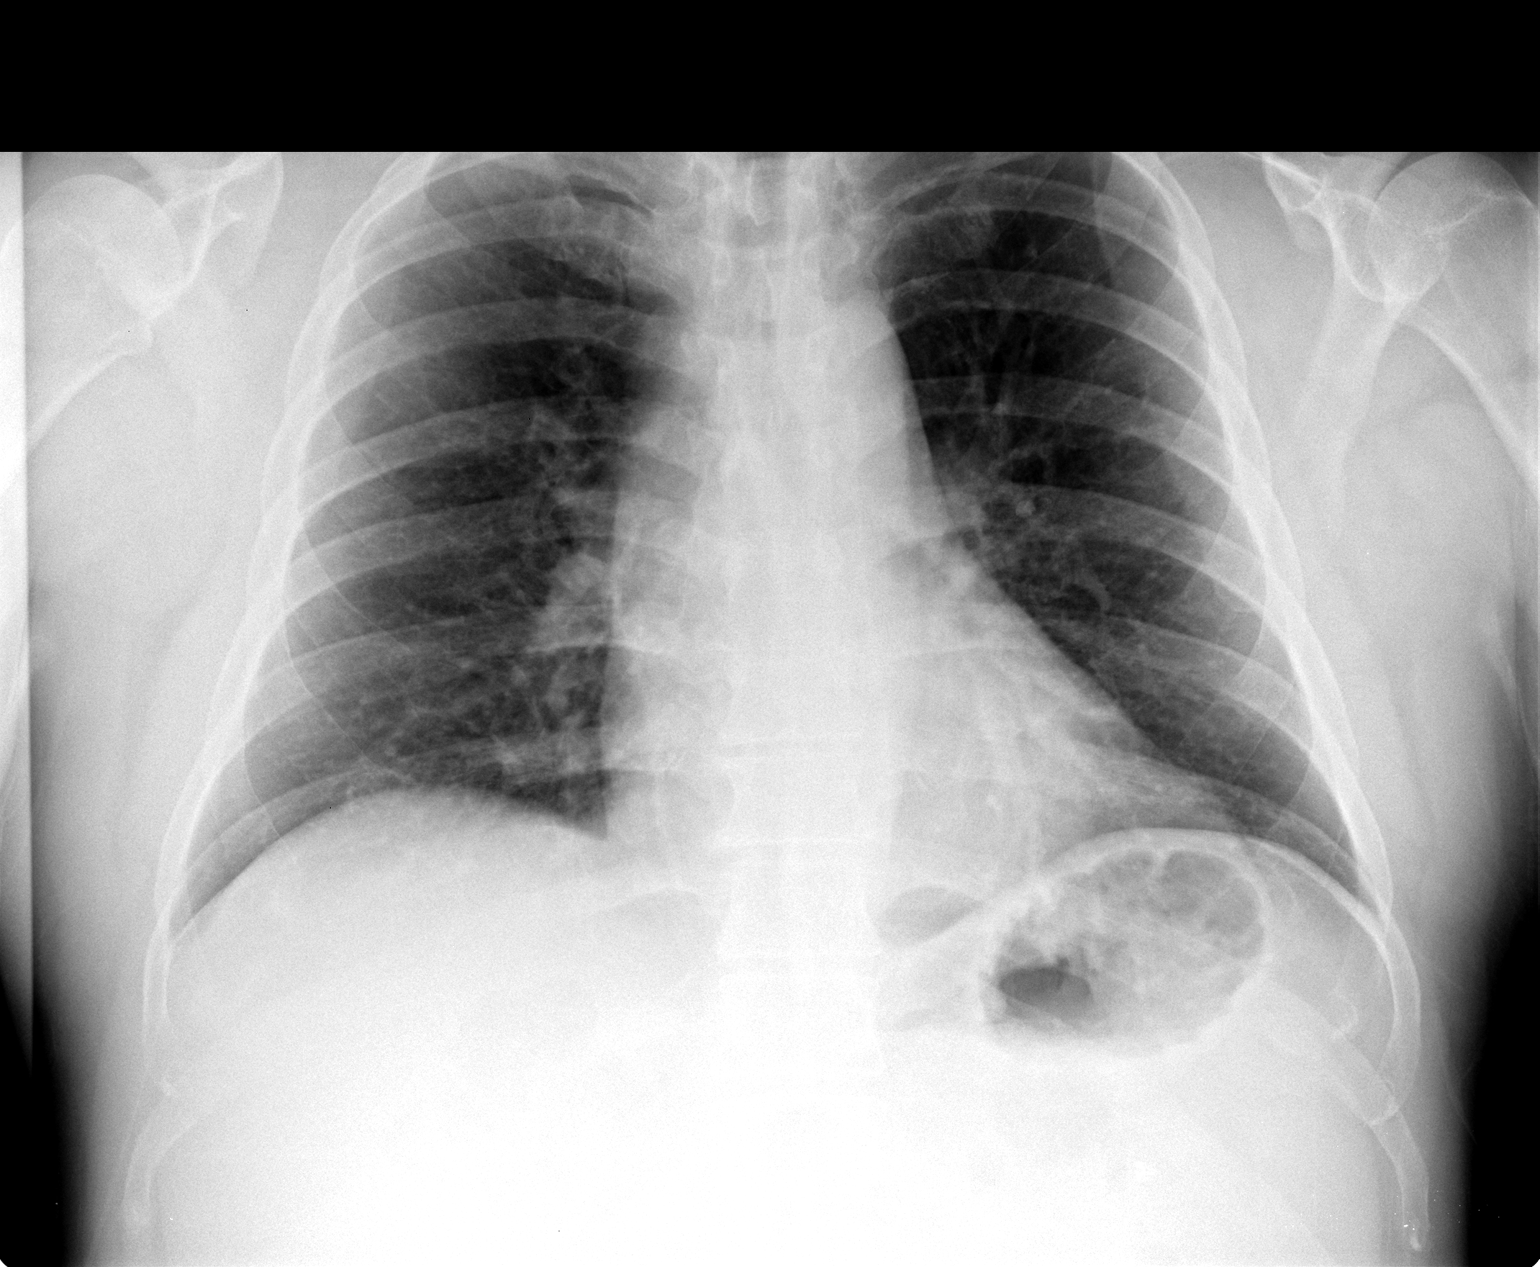

[view not recorded (2 of 2)]
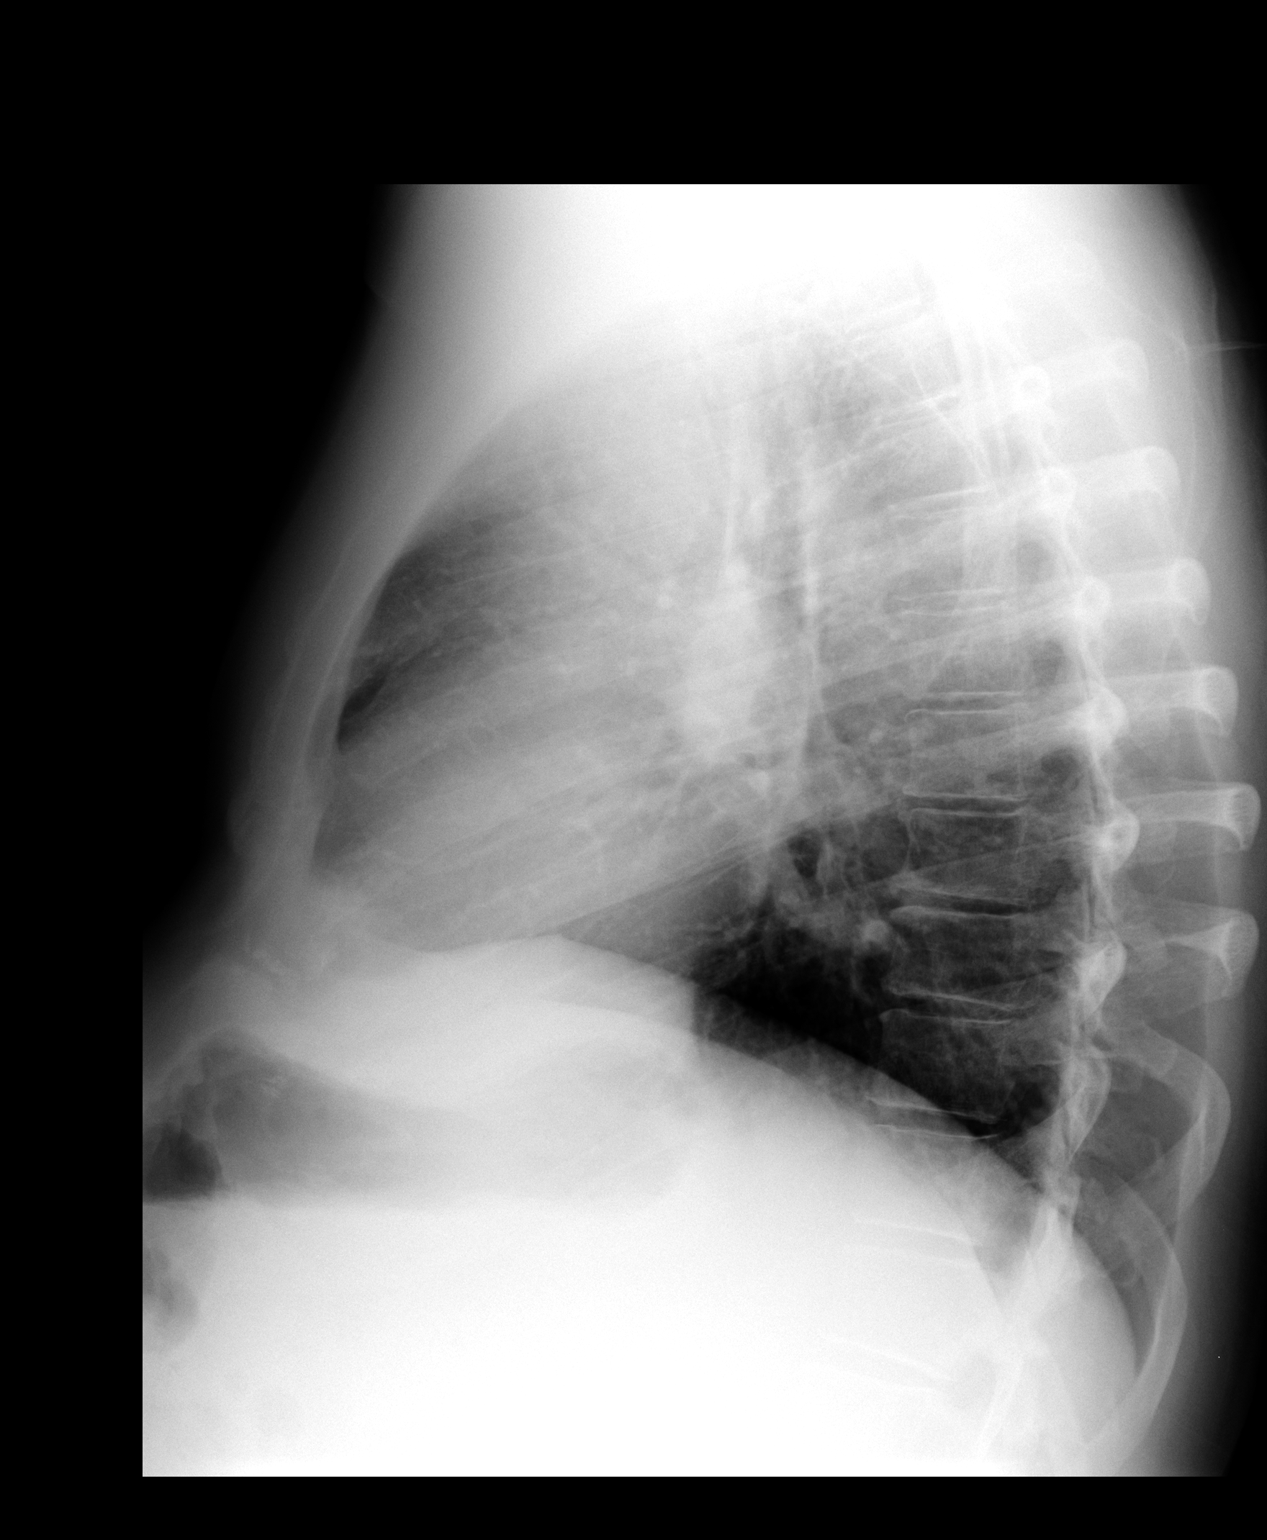

[2 of 2 positions shown; findings below may reference images not displayed]

FINDINGS: Cardiomediastinal silhouette is stable. No acute infiltrate or
pleural effusion. No pulmonary edema. Bony thorax is unremarkable.
IMPRESSION: No active cardiopulmonary disease.

## 2015-11-18 ENCOUNTER — Ambulatory Visit: Payer: Self-pay | Admitting: Infectious Diseases

## 2015-12-02 ENCOUNTER — Ambulatory Visit: Payer: Self-pay | Admitting: Pulmonary Disease

## 2015-12-17 ENCOUNTER — Telehealth: Payer: Self-pay | Admitting: Pulmonary Disease

## 2015-12-17 MED ORDER — ETHAMBUTOL HCL 400 MG PO TABS: 2000.0000 mg | ORAL_TABLET | ORAL | Status: DC

## 2015-12-17 MED ORDER — RIFAMPIN 300 MG PO CAPS: 600.0000 mg | ORAL_CAPSULE | ORAL | Status: DC

## 2015-12-17 MED ORDER — CLARITHROMYCIN 250 MG PO TABS: 1000.0000 mg | ORAL_TABLET | ORAL | Status: DC

## 2015-12-17 NOTE — Telephone Encounter (Signed)
Pt is due for appt with Dr. Kendrick Fries [facility] x1

## 2015-12-17 NOTE — Telephone Encounter (Signed)
Called spoke with pt. He scheduled appt at [facility] next available 02/20/16. He needs refill on all 3 ABX's sent to [facility] for 90 day supply.  I have sent these in and pt aware to keep appt. Nothing further needed

## 2015-12-17 NOTE — Telephone Encounter (Signed)
[phone] pt calling back

## 2015-12-18 ENCOUNTER — Other Ambulatory Visit: Payer: Self-pay

## 2016-02-04 ENCOUNTER — Telehealth: Payer: Self-pay | Admitting: Pulmonary Disease

## 2016-02-04 ENCOUNTER — Ambulatory Visit: Payer: Self-pay

## 2016-02-04 NOTE — Telephone Encounter (Signed)
LVM for paint management to return our call.

## 2016-02-05 NOTE — Telephone Encounter (Signed)
Kathie Rhodes from [facility] [phone] is calling about meds for the patient.  Meds they can assist patient with are albuterol nebulizer solution, proventil inhaler,Ayr saline nasal rinse, Symbicort inhaler, cayston, zyrtec, flonase nasal spray, singulair, nystatin suspension, maxalt, spiriva capsule, and topamax.

## 2016-02-05 NOTE — Telephone Encounter (Signed)
Called the number listed  This is a pharmacy, and is NOT pain management  She states they do not have the pt in the system and do not see that anyone from there called [place]  I called and spoke with the pt's spouse  She states that she is needing "all the medicines Dr Rosita Fire prescribed" called in under [facility]  She could not tell me the names of any of the meds she was referring to, and will call back later when she is not driving and can provide [place] with the info we need to help her

## 2016-02-05 NOTE — Telephone Encounter (Signed)
LVM for Kathie Rhodes to return call.

## 2016-02-06 NOTE — Telephone Encounter (Signed)
Lm for Jake Martinez with med mgmt clinic

## 2016-02-06 NOTE — Telephone Encounter (Signed)
LMTCB for Jake Martinez  

## 2016-02-06 NOTE — Telephone Encounter (Signed)
Kathie Rhodes calling back [phone]

## 2016-02-06 NOTE — Telephone Encounter (Signed)
Spoke with Kathie Rhodes with [facility] and she states that they are able to provide certain medications to patient who do not have insurance. She states that for this pt they can provide the following: Alb Nebs Proventil HFA Ayr Saline Symbicort inhaler Cayston Zyrtec Flonase Singulair Nystatin Susp Maxalt Spiriva Topamax   [facility] please advise if you are ok to fill these medications for this pt. Pharmacy cannot do EScribe so rx's will need to be printed, signed and faxed.   FAX# [phone]

## 2016-02-07 NOTE — Telephone Encounter (Signed)
lmtcb for Jake Martinez with med mgmt to make aware of below recs.  Will print rx's after speaking to [employer].

## 2016-02-07 NOTE — Telephone Encounter (Signed)
I'm OK with filling these meds:  Alb Nebs Proventil HFA Ayr Saline Symbicort inhaler Cayston Zyrtec Flonase Singulair Nystatin Susp Spiriva  I do not want to fill the maxalt or topamax because I don't prescribe those to him

## 2016-02-10 MED ORDER — MONTELUKAST SODIUM 10 MG PO TABS: 10.0000 mg | ORAL_TABLET | Freq: Every day | ORAL | Status: DC

## 2016-02-10 MED ORDER — AZTREONAM LYSINE 75 MG IN SOLR: 75.0000 mg | Freq: Three times a day (TID) | RESPIRATORY_TRACT | Status: AC

## 2016-02-10 MED ORDER — TIOTROPIUM BROMIDE MONOHYDRATE 18 MCG IN CAPS: 18.0000 ug | ORAL_CAPSULE | Freq: Every day | RESPIRATORY_TRACT | Status: DC

## 2016-02-10 MED ORDER — CETIRIZINE HCL 10 MG PO TABS: 10.0000 mg | ORAL_TABLET | Freq: Every day | ORAL | Status: DC

## 2016-02-10 MED ORDER — SODIUM CHLORIDE-SODIUM BICARB 1.57 G NA PACK: PACK | NASAL | Status: AC

## 2016-02-10 MED ORDER — FLUTICASONE PROPIONATE 50 MCG/ACT NA SUSP: 2.0000 | Freq: Every day | NASAL | Status: DC

## 2016-02-10 MED ORDER — NYSTATIN 100000 UNIT/ML MT SUSP: 5.0000 mL | OROMUCOSAL | Status: DC | PRN

## 2016-02-10 MED ORDER — ALBUTEROL SULFATE (2.5 MG/3ML) 0.083% IN NEBU: INHALATION_SOLUTION | RESPIRATORY_TRACT | Status: AC

## 2016-02-10 MED ORDER — BUDESONIDE-FORMOTEROL FUMARATE 160-4.5 MCG/ACT IN AERO: 2.0000 | INHALATION_SPRAY | Freq: Two times a day (BID) | RESPIRATORY_TRACT | Status: AC

## 2016-02-10 MED ORDER — ALBUTEROL SULFATE HFA 108 (90 BASE) MCG/ACT IN AERS: 2.0000 | INHALATION_SPRAY | Freq: Four times a day (QID) | RESPIRATORY_TRACT | Status: AC | PRN

## 2016-02-10 NOTE — Telephone Encounter (Signed)
Kathie Rhodes returned call, discussed [facility]'s recs as stated below.  Kathie Rhodes voiced her understanding and asked that we call patient to inform him that [facility] will not fill the Maxalt and Torsemide.  Worked with Kathie Rhodes to ensure that [facility] is in the system to receive eletronic rx's - pharmacy information verified.  Refills sent.  Called spoke with patient and discussed with him that [facility] will not be able to fill the maxalt and Torsemide and recommended that pt contact his PCP for these medication refills.  Pt okay with this and voice his understanding.  Nothing further needed; will sign off.

## 2016-02-10 NOTE — Telephone Encounter (Signed)
LM for Jake Martinez. 

## 2016-02-20 ENCOUNTER — Ambulatory Visit (INDEPENDENT_AMBULATORY_CARE_PROVIDER_SITE_OTHER): Payer: Self-pay | Admitting: Pulmonary Disease

## 2016-02-20 ENCOUNTER — Encounter: Payer: Self-pay | Admitting: Pharmacist

## 2016-02-20 ENCOUNTER — Encounter: Payer: Self-pay | Admitting: Pulmonary Disease

## 2016-02-20 ENCOUNTER — Other Ambulatory Visit (INDEPENDENT_AMBULATORY_CARE_PROVIDER_SITE_OTHER): Payer: Self-pay

## 2016-02-20 VITALS — BP 110/66 | HR 65 | Ht 67.0 in | Wt 191.0 lb

## 2016-02-20 DIAGNOSIS — B450 Pulmonary cryptococcosis: Secondary | ICD-10-CM

## 2016-02-20 DIAGNOSIS — R42 Dizziness and giddiness: Secondary | ICD-10-CM

## 2016-02-20 DIAGNOSIS — Z5181 Encounter for therapeutic drug level monitoring: Secondary | ICD-10-CM

## 2016-02-20 DIAGNOSIS — A310 Pulmonary mycobacterial infection: Secondary | ICD-10-CM

## 2016-02-20 DIAGNOSIS — J471 Bronchiectasis with (acute) exacerbation: Secondary | ICD-10-CM

## 2016-02-20 LAB — HEPATIC FUNCTION PANEL
ALBUMIN: 4.8 g/dL (ref 3.5–5.2)
ALK PHOS: 80 U/L (ref 39–117)
ALT: 20 U/L (ref 0–53)
AST: 19 U/L (ref 0–37)
BILIRUBIN DIRECT: 0.1 mg/dL (ref 0.0–0.3)
TOTAL PROTEIN: 7.3 g/dL (ref 6.0–8.3)
Total Bilirubin: 0.5 mg/dL (ref 0.2–1.2)

## 2016-02-20 MED ORDER — LEVOFLOXACIN 750 MG PO TABS: 750.0000 mg | ORAL_TABLET | Freq: Every day | ORAL | Status: DC

## 2016-02-20 NOTE — Assessment & Plan Note (Addendum)
Mild flare up in the last few weeks, improved slightly with levaquin but now recurring.  Plan: Levaquin for 10 days with probiotic Continue Caytson as prescribed Continue mucociliary clearance methods regularly Continue bronchodilators

## 2016-02-20 NOTE — Addendum Note (Signed)
Addended by: Velvet Bathe on: 02/20/2016 12:13 PM   Modules accepted: Orders

## 2016-02-20 NOTE — Progress Notes (Signed)
Subjective:    Patient ID: Jake Simpler Sr., male    DOB: 11-22-1965, 50 y.o.   MRN: [id number]  Synopsis: Mr. Jake Martinez is an alpha-1 carrier who has mild bronchiectasis in the bases of his lungs. He was first evaluated by the [facility] pulmonary clinic in 2012. He then followed up with [facility] for the following 3 years. During that time he continued to have evidence of only mild airflow obstruction on pulmonary function testing. 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 He was treated with one year with fluconazole. He was followed by infectious diseases as well as pulmonary at [facility]. For some reason Roflumilast was attempted during that time and he was quite intolerant of this with a lot of back pain. During that time he was started on inhaled amikacin and he was continued on hypertonic saline. He would have periodic flareups treated with Augmentin. He transferred his care back to the [place] pulmonary in 2015 for convenience because he lives in [place]. He had Mycobacterium avium intracellular grow out of his mucus twice in 2015 2016 so he was started on therapy again in August 2016.  HPI Chief Complaint  Patient presents with  . Follow-up    Pt c/o dizziness when sitting up, was given levaquin from [facility].  pt denies chest pain/tightness, increased sob.  Cough is prod with yellow/green mucus.     Jake Martinez has been doing OK with the exception of some dizzy spells when he sits up.  He went to an urgent care in [facility] and was prescribed an antibiotic.  This problem continues.  He feels like he is going to pass out frequently.  He has been having a lot more sinus congestion with the pollen.    His breathing has been about the same.  He still wheezes frequently ("all the time") and he still coughs up mucus which is pale yellow fairly frequently, sometimes green in color.  He has not had bloody mucus.  The levaquin he took for the ear infection actually helped with  his mucus production.  In generally he feels more run down lately, more fatigued.    Past Medical History  Diagnosis Date  . Alpha-1-antitrypsin deficiency (HCC)   . Bronchiectasis   . Hyperlipidemia   . Chronic headaches   . OSA (obstructive sleep apnea)   . Cryptococcal pneumonitis (HCC) 01-2013     Review of Systems  Constitutional: Negative for fever, chills, activity change, appetite change and fatigue.  HENT: Negative for congestion, ear pain, hearing loss, postnasal drip, rhinorrhea, sinus pressure and sneezing.   Eyes: Negative for redness, itching and visual disturbance.  Respiratory: Positive for cough and shortness of breath. Negative for chest tightness and wheezing.   Cardiovascular: Negative for chest pain, palpitations and leg swelling.       Objective:   Physical Exam Filed Vitals:   02/20/16 1125  BP: 110/66  Pulse: 65  Height: 5\' 7"  (1.702 m)  Weight: 191 lb (86.637 kg)  SpO2: 95%   RA  Gen:  no acute distress HEENT: NCAT, EOMi, OP clear PULM: loud rhonchi bases, good air movement CV: RRR, no mgr, no JVD AB: BS+, soft, nontender,  Ext: warm, no edema, no clubbing, no cyanosis Derm: no rash or skin breakdown Neuro: A&Ox4, MAEW   November 2012 pulmonary function test at [facility] ratio 89%, FEV1 2.8 L (82% predicted, no change with bronchodilator) no lung volumes or DLCO performed October 2015 PFT >  Ratio 87% FEV1 2.68 L (81% , 15% change with BD), TLC 5.25 L (86% pred), DLCO 22.2 (84% pred)  Respiratory culture results from [facility]: March 2014 cryptococcus July 2014 aspergillus [place] October 2014 aspergillus [place] October 2015 penicillium species October 2014 Actinomadura October 2014 Streptomyces January 2015 Actinomadura August 2015 Mycobacterium avium complex August 2015 Actinomadura October 2015 haemophilus species October 2015 "mold"  10/2014 Sputum culture > strep pneumo 10/2014 Sputum Fungal> fungus isolated, speciation pending > not  crypto 10/2014 Sputum AFB> negative  01/2015 AFB culture > MAI 01/2015 Fungal culture> negative  05/2015 AFB Culture > negative 05/2015 fungal culture > candida  09/2015 AFB culture > negative 09/2015 fungal culture > candida  Notes from Dr. Ninetta Lights were reviewed today where he was monitored for 3 drug therapy, LFTs were collected and were normal.     Assessment & Plan:   MAI (mycobacterium avium-intracellulare) (HCC) He has done fairly well with three drug therapy for his MAI prior to this episode of a bronchiectasis flare up.   Plan: Sputum culture, surveillance today Continue three drug therapy after holiday while he takes levaquin Check LFT Plan to treat 12 months after last negative culture  Dizziness He continues to struggle with positional dizziness and sinus congestion despite antibiotic treatment for an ear infection.  I agree this sounds vesitbular but with his sinus symptoms I think it is reasonable for him to have an evaluation for this by [facility].  Bronchiectasis Mild flare up in the last few weeks, improved slightly with levaquin but now recurring.  Plan: Levaquin for 10 days with probiotic Continue Caytson as prescribed Continue mucociliary clearance methods regularly Continue bronchodilators  Cryptococcal pneumonitis No evidence of recurrence    Updated Medication List Outpatient Encounter Prescriptions as of 02/20/2016  Medication Sig  . acetaminophen (TYLENOL) 325 MG tablet Take 650 mg by mouth every 6 (six) hours as needed.  Marland Kitchen albuterol (PROVENTIL HFA) 108 (90 Base) MCG/ACT inhaler Inhale 2 puffs into the lungs every 6 (six) hours as needed.  Marland Kitchen albuterol (PROVENTIL) (2.5 MG/3ML) 0.083% nebulizer solution 1 vial in nebulizer three times daily with sodium chloride  . Aztreonam Lysine (CAYSTON) 75 MG SOLR Inhale 75 mg into the lungs 3 (three) times daily. Use for 28 days, off for 28 days.  . Aztreonam Lysine 75 MG SOLR Inhale 75 mg into the lungs 3  (three) times daily.  . budesonide-formoterol (SYMBICORT) 160-4.5 MCG/ACT inhaler Inhale 2 puffs into the lungs 2 (two) times daily.  . cetirizine (ZYRTEC) 10 MG tablet Take 1 tablet (10 mg total) by mouth daily. Alternates with allegra 180  . Cholecalciferol (VITAMIN D) 2000 UNITS CAPS Take 1 capsule by mouth daily.    . clarithromycin (BIAXIN) 250 MG tablet Take 4 tablets (1,000 mg total) by mouth every Monday, Wednesday, and Friday.  . ethambutol (MYAMBUTOL) 400 MG tablet Take 5 tablets (2,000 mg total) by mouth every Monday, Wednesday, and Friday.  . fluticasone (FLONASE) 50 MCG/ACT nasal spray Place 2 sprays into both nostrils daily.  . montelukast (SINGULAIR) 10 MG tablet Take 1 tablet (10 mg total) by mouth at bedtime.  Marland Kitchen nystatin (MYCOSTATIN) 100000 UNIT/ML suspension Take 5 mLs (500,000 Units total) by mouth as needed.  Marland Kitchen omeprazole (PRILOSEC) 20 MG capsule Take 40 mg by mouth daily.  . rifampin (RIFADIN) 300 MG capsule Take 2 capsules (600 mg total) by mouth every Monday, Wednesday, and Friday.  . rizatriptan (MAXALT) 10 MG tablet Take 10 mg by mouth as needed for  migraine. May repeat in 2 hours if needed  . Sodium Chloride, Inhalant, 7 % NEBU Inhale 1 vial into the lungs 3 (three) times daily. With albuterol   . Sodium Chloride-Sodium Bicarb (AYR SALINE NASAL NETI RINSE) 1.57 g PACK Use twice per day  . tiotropium (SPIRIVA) 18 MCG inhalation capsule Place 1 capsule (18 mcg total) into inhaler and inhale daily.  Marland Kitchen topiramate (TOPAMAX) 100 MG tablet Take 100 mg by mouth daily.  Marland Kitchen levofloxacin (LEVAQUIN) 750 MG tablet Take 1 tablet (750 mg total) by mouth daily.   No facility-administered encounter medications on file as of 02/20/2016.

## 2016-02-20 NOTE — Assessment & Plan Note (Addendum)
He has done fairly well with three drug therapy for his MAI prior to this episode of a bronchiectasis flare up.   Plan: Sputum culture, surveillance today Continue three drug therapy after holiday while he takes levaquin Check LFT Plan to treat 12 months after last negative culture

## 2016-02-20 NOTE — Progress Notes (Signed)
Quick Note:  Spoke with pt and notified of results per Dr. McQuaid. Pt verbalized understanding and denied any questions.  ______ 

## 2016-02-20 NOTE — Addendum Note (Signed)
Addended by: Dorethea Clan L on: 02/20/2016 12:12 PM   Modules accepted: Orders

## 2016-02-20 NOTE — Patient Instructions (Signed)
Take the Levaquin for the next 10 days Hold 3 drug therapy during this time, after completing 10 days of Levaquin then resume taking 3 drug therapy (antibiotics) We will check her liver function tests today to ensure that there is no evidence of toxicity from 3 drug therapy We will see you back in 2 months or sooner if needed

## 2016-02-20 NOTE — Assessment & Plan Note (Signed)
He continues to struggle with positional dizziness and sinus congestion despite antibiotic treatment for an ear infection.  I agree this sounds vesitbular but with his sinus symptoms I think it is reasonable for him to have an evaluation for this by [facility].

## 2016-02-20 NOTE — Assessment & Plan Note (Signed)
No evidence of recurrence 

## 2016-02-27 ENCOUNTER — Telehealth: Payer: Self-pay | Admitting: Pulmonary Disease

## 2016-02-27 ENCOUNTER — Encounter: Payer: Self-pay | Admitting: Pharmacist

## 2016-02-27 DIAGNOSIS — R1310 Dysphagia, unspecified: Secondary | ICD-10-CM

## 2016-02-27 NOTE — Telephone Encounter (Signed)
LMTCB  Per chart pt has already been seen by Dr. Suszanne Conners. Does he want to return there or have referral somewhere else?

## 2016-02-27 NOTE — Telephone Encounter (Signed)
LMTC x 1 for [employer]

## 2016-02-27 NOTE — Telephone Encounter (Signed)
Spoke with [facility] and she states that they did speak with the pt about being seen there. She advised the pt that they do not accept the [facility], therefore pt would be self-pay. Pt has declined to be seen there as he cannot afford to be self-pay. Please advise if you would like another referral placed for pt. Pt has previous referral to [facility].   BQ, please advise. Thanks!

## 2016-02-27 NOTE — Telephone Encounter (Signed)
OK to see someone who works with the [facility]

## 2016-03-02 NOTE — Telephone Encounter (Signed)
Spoke with pt, states that he does not care to see Dr. Suszanne Conners again, but just wants to make sure that wherever he goes has the capabilities to do an in office laryngoscopy (per pt's description).   PCC's please advise if this referral can be done on same referral order, or if this needs to be reordered.  Thanks!

## 2016-03-02 NOTE — Telephone Encounter (Signed)
Go ahead and put in new referral stating not dr Suszanne Conners thank Tobe Sos

## 2016-03-03 ENCOUNTER — Telehealth: Payer: Self-pay | Admitting: Pulmonary Disease

## 2016-03-03 NOTE — Telephone Encounter (Signed)
New referral placed.  Nothing further needed.

## 2016-03-03 NOTE — Telephone Encounter (Signed)
Received call from [facility]-  Sputum pos for [employer]  Will forward to Dr Kendrick Fries to make him aware

## 2016-03-04 ENCOUNTER — Telehealth: Payer: Self-pay

## 2016-03-04 DIAGNOSIS — R42 Dizziness and giddiness: Secondary | ICD-10-CM

## 2016-03-04 NOTE — Telephone Encounter (Signed)
Spoke with pt to review below options.  Pt states he is willing to see Dr. Suszanne Conners again to see what can be done for him.  New referral placed.  Nothing further needed.  Forwarding to BQ as FYI.  Nothing further needed on this.

## 2016-03-04 NOTE — Telephone Encounter (Signed)
-----   Message from Lupita Leash, MD sent at 03/04/2016 12:38 PM EDT ----- It sounds like Suszanne Conners is his only option.  I'm not sure what else to offer.  ----- Message -----    From: Velvet Bathe, CMA    Sent: 03/04/2016   9:20 AM      To: Lupita Leash, MD  Hey, so we were trying to get this guy in to see an ENT in [facility] preferably (his preference) that accepts the [facility] patient assistance program-pt also requested not to see Dr. Suszanne Conners, and requested that wherever he goes have in-office laryngoscopy.  I've been working with [employer]	[place] and Dr. Suszanne Conners is the only ENT that takes the [facility] patient assistance.  We also tried [facility]NT office in [place] and they don't accept it either.  It looks like no matter where he goes, he will be self-pay, which he is declining to do.   Any advice on his next step?    Thanks!

## 2016-03-04 NOTE — Telephone Encounter (Signed)
noted 

## 2016-03-05 ENCOUNTER — Telehealth: Payer: Self-pay | Admitting: Pulmonary Disease

## 2016-03-05 NOTE — Telephone Encounter (Signed)
Call report received from [facility] [employer] results received >> positive for [facility]  No final results releases to Epic as of yet Routed to BQ as FYI

## 2016-03-06 NOTE — Telephone Encounter (Signed)
Noted, please let him know he needs to keep taking the three antibiotics for it for at least another 12 months.

## 2016-03-06 NOTE — Telephone Encounter (Signed)
Called spoke with pt. Reviewed BQ's recs. He voiced understanding and had no further questions. Nothing further needed.

## 2016-03-24 ENCOUNTER — Telehealth: Payer: Self-pay

## 2016-03-24 LAB — AFB CULTURE WITH SMEAR (NOT AT ARMC)

## 2016-03-24 NOTE — Telephone Encounter (Signed)
Noted, I sent a note to [facility]

## 2016-03-24 NOTE — Telephone Encounter (Signed)
Patient aware of results, will send to Dr. Kendrick Fries as [place] Childes.

## 2016-03-24 NOTE — Telephone Encounter (Signed)
Spoke with Laurelyn Sickle @ [facility] for CALL REPORT: Pt's sputum culture POSITIVE for MAC.  BQ on vacation. TP please advise. Thanks!  Source: SPUTUM   Acid Fast Bacilli (AFB) Culture (A)   Comments: MYCOBACTERIA, CULTURE, WITH FLUOROCHROME SMEAR     MICRO NUMBER:   [id number]   TEST STATUS:    FINAL   SPECIMEN SOURCE:  SPUTUM   SPECIMEN QUALITY: ADEQUATE   SMEAR:       No acid fast bacilli seen.   RESULT:      Growth of Mycobacterium avium-intracellulare complex

## 2016-03-24 NOTE — Telephone Encounter (Signed)
cx cont to show MAI  Cont on current 3 drug therapy.  Will send to Dr. Kendrick Fries for further instructions when he returns

## 2016-03-24 NOTE — Telephone Encounter (Signed)
Jake Martinez,         Please call [facility] and tell them that we need to send this for drug sensitivities, I suppose it needs to go to [facility] in [place].        Thanks    [employer] at [phone], an automated message told me that all lines were busy and to call back at a later time, then hung up.  atc back, same message.  Wcb.tomorrow morning.

## 2016-04-01 ENCOUNTER — Telehealth: Payer: Self-pay | Admitting: Pulmonary Disease

## 2016-04-01 NOTE — Telephone Encounter (Signed)
Called, spoke with pt.  Pt reports he has been on Biaxin, Myambutol, and rifampin "for a while now" but recently stopped it to complete another abx course then restarted the 3 drugs.  Pt takes Biaxin and Myambutol at approx 8 am and the rifampin around 10:30-11 am on M,W,F.  Pt reports prior to the past week, he has been tolerating the 3 drugs well with only diarrhea approx once daily.  However, for the past 3 doses (5/24, 5/26, and 5/29), after approx 1-2 hours of taking Rifampin, pt notices a "bad" ache starting in the back of neck, going down to chest, legs, and arms.  Pt states he can barely sit up because the pain is so bad during these spells.  He also notes shaking, cold chills, and feeling feverish with these 3 episodes.  States episodes last for approx 4 hours.  He did note some nausea with increased watery diarrhea with episode on Monday.  He denies SOB now or with episodes.  Wheezing and cough are at baseline.  Pt feels symptoms are coming from the Rifampin.  Pt did take Biaxin and Myambutol this morning around 8:30 and has not taken the Rifampin.  Pt states he is feeling fine so far and does not plan to take Rifampin today to see if symptoms return.  Pt scheduled appt for tomorrow at 9:15 am with TP (pt unable to come in today) and is to seek emergency care if needed prior to appt.  Pt would like BQ to be aware of above and provide any further recs he may have prior to appt tomorrow.  Will send to BQ as FYI and to provide any recs if needed.  Thank you!

## 2016-04-01 NOTE — Telephone Encounter (Signed)
I think that coming in tomorrow is OK.  Its hard to know if this is a reaction to the medicines or not, but I think the approach he has taken is fine.  We may ultimately need to change his medication regimen anyway as we are awaiting sensitivities from the most recent culture to come back.

## 2016-04-01 NOTE — Telephone Encounter (Signed)
lmtcb x1 for pt. 

## 2016-04-01 NOTE — Telephone Encounter (Signed)
Spoke with pt. He is aware of BQ's recommendation. Nothing further was needed.

## 2016-04-02 ENCOUNTER — Other Ambulatory Visit: Payer: Self-pay | Admitting: Adult Health

## 2016-04-02 ENCOUNTER — Other Ambulatory Visit (INDEPENDENT_AMBULATORY_CARE_PROVIDER_SITE_OTHER): Payer: Self-pay

## 2016-04-02 ENCOUNTER — Encounter: Payer: Self-pay | Admitting: Adult Health

## 2016-04-02 ENCOUNTER — Ambulatory Visit (INDEPENDENT_AMBULATORY_CARE_PROVIDER_SITE_OTHER): Payer: Self-pay | Admitting: Adult Health

## 2016-04-02 VITALS — BP 118/76 | HR 64 | Temp 97.7°F | Ht 67.0 in | Wt 190.0 lb

## 2016-04-02 DIAGNOSIS — R079 Chest pain, unspecified: Secondary | ICD-10-CM

## 2016-04-02 DIAGNOSIS — A310 Pulmonary mycobacterial infection: Secondary | ICD-10-CM

## 2016-04-02 DIAGNOSIS — J471 Bronchiectasis with (acute) exacerbation: Secondary | ICD-10-CM

## 2016-04-02 LAB — CBC WITH DIFFERENTIAL/PLATELET
BASOS PCT: 0.2 % (ref 0.0–3.0)
Basophils Absolute: 0 10*3/uL (ref 0.0–0.1)
Eosinophils Absolute: 0.2 10*3/uL (ref 0.0–0.7)
Eosinophils Relative: 4.8 % (ref 0.0–5.0)
HCT: 43.9 % (ref 39.0–52.0)
Hemoglobin: 14.8 g/dL (ref 13.0–17.0)
LYMPHS ABS: 0.5 10*3/uL — AB (ref 0.7–4.0)
Lymphocytes Relative: 9.4 % — ABNORMAL LOW (ref 12.0–46.0)
MCHC: 33.8 g/dL (ref 30.0–36.0)
MCV: 85.1 fl (ref 78.0–100.0)
MONO ABS: 0.4 10*3/uL (ref 0.1–1.0)
Monocytes Relative: 8.7 % (ref 3.0–12.0)
NEUTROS PCT: 76.9 % (ref 43.0–77.0)
Neutro Abs: 3.9 10*3/uL (ref 1.4–7.7)
Platelets: 237 10*3/uL (ref 150.0–400.0)
RBC: 5.16 Mil/uL (ref 4.22–5.81)
RDW: 14.3 % (ref 11.5–15.5)
WBC: 5.1 10*3/uL (ref 4.0–10.5)

## 2016-04-02 LAB — BASIC METABOLIC PANEL
BUN: 18 mg/dL (ref 6–23)
CO2: 26 mEq/L (ref 19–32)
CREATININE: 1 mg/dL (ref 0.40–1.50)
Calcium: 9.4 mg/dL (ref 8.4–10.5)
Chloride: 106 mEq/L (ref 96–112)
GFR: 83.79 mL/min (ref >60.00)
Glucose, Bld: 151 mg/dL — ABNORMAL HIGH (ref 70–99)
Potassium: 4 mEq/L (ref 3.5–5.1)
SODIUM: 138 meq/L (ref 135–145)

## 2016-04-02 LAB — HEPATIC FUNCTION PANEL
ALT: 23 U/L (ref 0–53)
AST: 15 U/L (ref 0–37)
Albumin: 4.7 g/dL (ref 3.5–5.2)
Alkaline Phosphatase: 63 U/L (ref 39–117)
BILIRUBIN TOTAL: 0.4 mg/dL (ref 0.2–1.2)
Bilirubin, Direct: 0.1 mg/dL (ref 0.0–0.3)
Total Protein: 7.2 g/dL (ref 6.0–8.3)

## 2016-04-02 MED ORDER — RIFABUTIN 150 MG PO CAPS: 300.0000 mg | ORAL_CAPSULE | Freq: Every day | ORAL | Status: DC

## 2016-04-02 MED ORDER — ALIGN 4 MG PO CAPS: 1.0000 | ORAL_CAPSULE | Freq: Every day | ORAL | Status: AC

## 2016-04-02 NOTE — Assessment & Plan Note (Signed)
MAI on 3 drug therapy with assumed medication intolerances  AFB cultures remain positive with most recent cx in April  Sensitivities are pending.  For now hold on Rifampim  Will discuss with Dr. Kendrick Fries alternative options.  Check labs with cbc and lft.  .Please contact office for sooner follow up if symptoms do not improve or worsen or seek emergency care

## 2016-04-02 NOTE — Progress Notes (Signed)
Quick Note:  Called spoke with pt. Verified pharmacy as medication management clinic in [place]. Reviewed results and recs. Pt voiced understanding and had no further questions. ______

## 2016-04-02 NOTE — Assessment & Plan Note (Signed)
Recent flare now resolving after levaquin  Could consider repeat CT chest if continues to flare  Can add VEST going forward if needed.

## 2016-04-02 NOTE — Progress Notes (Signed)
Chart reviewed.  Since it sounds like this is specific toxicity to Rifampin we will check LFTs and change to Rifabutin  po daily if LFT's OK.  Will await drug sensitivies on most recent culture which was positive for MAI.  Will ensure he has f/u with ID.

## 2016-04-02 NOTE — Patient Instructions (Addendum)
Remain off Rifampin for now  Labs today .  Continue on Biaxin and Ethambutol but try to take different at different times with food.  Begin Probiotic daily  Follow up Dr. Kendrick Fries as planned and As needed   I will call once I speak with Dr. Kendrick Fries if any changes are needed.  Please contact office for sooner follow up if symptoms do not improve or worsen or seek emergency care

## 2016-04-02 NOTE — Telephone Encounter (Signed)
Result Note     Labs are normal , liver fxn is good , electrolyte panel is ok, and CBC is normal     Discussed case with Dr. Kendrick Fries he recommends begin Rifabutin 300mg  po daily ( in place of Rifampin )     Discuss which pharmacy he would like this sent to as he does not have insurance and gets rx assistance thru local programs.     follow up Dr. Kendrick Fries in 1 month as planned and As needed     Called spoke with pt. Reviewed results and recs. Verified pharmacy as medication clinic in [place] . Rx has been sent. Pt voiced understanding and had no further questions.

## 2016-04-02 NOTE — Progress Notes (Signed)
Subjective:    Patient ID: Jake Simpler Sr., male    DOB: Jul 16, 1966, 50 y.o.   MRN: [id number]  HPI 50 yo male Never smoker with alpha one carrier (MZ w/ low level ~78) , who has mild bronchiectasis in lung bases followed by .Dr. Kendrick Martinez   Synopsis :  Synopsis: Jake Martinez is an alpha-1 carrier who has mild bronchiectasis in the bases of his lungs. He was first evaluated by the [facility] in 2012. He then followed up with [facility] for the following 3 years. During that time he continued to have evidence of only mild airflow obstruction on Martinez function testing. 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 He was treated with one year with fluconazole. He was followed by infectious diseases as well as [facility] at [facility]. For some reason Roflumilast was attempted during that time and he was quite intolerant of this with a lot of back pain. During that time he was started on inhaled amikacin and he was continued on hypertonic saline. He would have periodic flareups treated with Augmentin. He transferred his care back to the [facility] in 2015 for convenience because he lives in [place]. He had Mycobacterium avium intracellular grow out of his mucus twice in 2015 2016 so he was started on therapy again in August 2016.  04/02/2016 Acute OV  : Bronchiectasis [place] [facility]  Patient presents for an acute office visit. Patient was seen in the office 6 weeks ago with a bronchiectatic exacerbation. He was treated with Levaquin. During that time. Patient was taken off of his 3 drug treatment for his Mycobacterium avium intracellulare . Patient says since starting his 3 drug regimen. He has felt very bad. He complains of feeling washed out no energy, somewhat sick feeling all over. He had some low grade fever, chills and significant malaise. He says when he was taken Levaquin. 6 weeks ago. He did feel somewhat better with resolution of the above symptoms. Patient says  once restarting his 3 drug regimen symptoms restarted with significant malaise sick feeling, chills, low-grade fevers, loose stools for  the last week. Patient has significant episode after taking rifampin in which he had significant sweats, headache, chills, feverish, nausea, chest tightness. Patient stopped taking rifampin for the last 2 days and has had no return of symptoms. He says the above symptoms last for about 4 hours before they resolved. He has been on  rifampin, Biaxin and ethambutol 3 days a week  since August 2016. Patient had recent sputum cultures that tested positive for MAI in April. Sensitivities are pending. He does feel that his cough and congestion are improved since taken Levaquin.  Remains on [place] neb Three times a day  For 1 month and then off 1 month .  Previously on Tobramycin neb but has been off for a while.  He does not have any insurance . Has several med programs that he gets assistance through  Denies chest pain, orthopnea, PND, or increased leg swelling. No hemoptysis, rash , or diarrhea.  Past Medical History  Diagnosis Date  . Alpha-1-antitrypsin deficiency (HCC)   . Bronchiectasis   . Hyperlipidemia   . Chronic headaches   . OSA (obstructive sleep apnea)   . Cryptococcal pneumonitis (HCC) 01-2013   Current Outpatient Prescriptions on File Prior to Visit  Medication Sig Dispense Refill  . acetaminophen (TYLENOL) 325 MG tablet Take 650 mg by mouth every 6 (six) hours as needed.    Marland Kitchen  albuterol (PROVENTIL HFA) 108 (90 Base) MCG/ACT inhaler Inhale 2 puffs into the lungs every 6 (six) hours as needed. 8 g 3  . albuterol (PROVENTIL) (2.5 MG/3ML) 0.083% nebulizer solution 1 vial in nebulizer three times daily with sodium chloride 75 mL 3  . Aztreonam Lysine (CAYSTON) 75 MG SOLR Inhale 75 mg into the lungs 3 (three) times daily. Use for 28 days, off for 28 days. 50 mL 3  . Aztreonam Lysine 75 MG SOLR Inhale 75 mg into the lungs 3 (three) times daily. 50 mL 5  .  budesonide-formoterol (SYMBICORT) 160-4.5 MCG/ACT inhaler Inhale 2 puffs into the lungs 2 (two) times daily. 1 Inhaler 3  . cetirizine (ZYRTEC) 10 MG tablet Take 1 tablet (10 mg total) by mouth daily. Alternates with allegra 180 30 tablet 3  . Cholecalciferol (VITAMIN D) 2000 UNITS CAPS Take 1 capsule by mouth daily.      . clarithromycin (BIAXIN) 250 MG tablet Take 4 tablets (1,000 mg total) by mouth every Monday, Wednesday, and Friday. 144 tablet 1  . ethambutol (MYAMBUTOL) 400 MG tablet Take 5 tablets (2,000 mg total) by mouth every Monday, Wednesday, and Friday. 180 tablet 1  . fluticasone (FLONASE) 50 MCG/ACT nasal spray Place 2 sprays into both nostrils daily. 16 g 3  . levofloxacin (LEVAQUIN) 750 MG tablet Take 1 tablet (750 mg total) by mouth daily. 10 tablet 0  . montelukast (SINGULAIR) 10 MG tablet Take 1 tablet (10 mg total) by mouth at bedtime. 30 tablet 3  . nystatin (MYCOSTATIN) 100000 UNIT/ML suspension Take 5 mLs (500,000 Units total) by mouth as needed. 60 mL 3  . omeprazole (PRILOSEC) 20 MG capsule Take 40 mg by mouth daily.    . rizatriptan (MAXALT) 10 MG tablet Take 10 mg by mouth as needed for migraine. May repeat in 2 hours if needed    . Sodium Chloride, Inhalant, 7 % NEBU Inhale 1 vial into the lungs 3 (three) times daily. With albuterol     . Sodium Chloride-Sodium Bicarb (AYR SALINE NASAL NETI RINSE) 1.57 g PACK Use twice per day 40 each 3  . tiotropium (SPIRIVA) 18 MCG inhalation capsule Place 1 capsule (18 mcg total) into inhaler and inhale daily. 30 capsule 3  . topiramate (TOPAMAX) 100 MG tablet Take 100 mg by mouth daily.    . rifampin (RIFADIN) 300 MG capsule Take 2 capsules (600 mg total) by mouth every Monday, Wednesday, and Friday. (Patient not taking: Reported on 04/02/2016) 72 capsule 1   No current facility-administered medications on file prior to visit.      Review of Systems Constitutional:   No  weight loss, night sweats,   +Fevers, chills, fatigue, or   lassitude.  HEENT:   No headaches,  Difficulty swallowing,  Tooth/dental problems, or  Sore throat,                No sneezing, itching, ear ache, nasal congestion, post nasal drip,   CV:  No chest pain,  Orthopnea, PND, swelling in lower extremities, anasarca, dizziness, palpitations, syncope.   GI  No heartburn, indigestion, abdominal pain, nausea, vomiting, diarrhea, change in bowel habits, loss of appetite, bloody stools.   Resp:    No chest wall deformity  Skin: no rash or lesions.  GU: no dysuria, change in color of urine, no urgency or frequency.  No flank pain, no hematuria   MS:  No joint pain or swelling.  No decreased range of motion.  No back pain.  Psych:  No change in mood or affect. No depression or anxiety.  No memory loss.         Objective:   Physical Exam Filed Vitals:   04/02/16 0916  BP: 118/76  Pulse: 64  Temp: 97.7 F (36.5 C)  TempSrc: Oral  Height: 5\' 7"  (1.702 m)  Weight: 190 lb (86.183 kg)  SpO2: 96%   GEN: A/Ox3; pleasant , NAD   HEENT:  [place]/AT,  EACs-clear, TMs-wnl, NOSE-clear, THROAT-clear, no lesions, no postnasal drip or exudate noted.   NECK:  Supple w/ fair ROM; no JVD; normal carotid impulses w/o bruits; no thyromegaly or nodules palpated; no lymphadenopathy.  RESP  Coarse rhonchi , no accessory muscle use, no dullness to percussion  CARD:  RRR, no m/r/g  , no peripheral edema, pulses intact, no cyanosis or clubbing.  GI:   Soft & nt; nml bowel sounds; no organomegaly or masses detected.  Musco: Warm bil, no deformities or joint swelling noted.   Neuro: alert, no focal deficits noted.    Skin: Warm, no lesions or rashes  Tammy Parrett NP-C  [facility]  04/02/2016       Assessment & Plan:

## 2016-04-03 ENCOUNTER — Telehealth: Payer: Self-pay | Admitting: Adult Health

## 2016-04-03 NOTE — Telephone Encounter (Signed)
Jake Martinez patient wife called and states that [facility] doesn't carry the medicines that was requested she believes it's the Mycobutin. This rx needs to goes to [facility] [employer]. Patient doesn't have the number. Jake Martinez can be reached at [phone]

## 2016-04-03 NOTE — Telephone Encounter (Signed)
lmtcb x1 for [employer].

## 2016-04-03 NOTE — Telephone Encounter (Signed)
Spoke with pt. He was confused on what medications to take. These have been clarified with him. He does not need any prescriptions sent in. Nothing further was needed.

## 2016-04-06 MED ORDER — RIFABUTIN 150 MG PO CAPS: ORAL_CAPSULE | ORAL | Status: DC

## 2016-04-06 NOTE — Telephone Encounter (Signed)
Called spoke with [place]. She states that the pt's rifabutin needs to be sent to [facility] pharmacy due to the cost. I explained to her that I would resend the medication. She voiced understanding and had no further questions. Rx has been sent. Nothing further needed.

## 2016-04-06 NOTE — Telephone Encounter (Signed)
Ephriam Knuckles, calling from pulm rehab on this pt she can be reached @ [phone].[name]

## 2016-05-08 ENCOUNTER — Other Ambulatory Visit: Payer: Self-pay | Admitting: Pulmonary Disease

## 2016-05-08 ENCOUNTER — Ambulatory Visit (INDEPENDENT_AMBULATORY_CARE_PROVIDER_SITE_OTHER): Payer: Self-pay | Admitting: Pulmonary Disease

## 2016-05-08 ENCOUNTER — Other Ambulatory Visit: Payer: Self-pay

## 2016-05-08 ENCOUNTER — Encounter: Payer: Self-pay | Admitting: Pulmonary Disease

## 2016-05-08 VITALS — BP 134/70 | HR 76 | Ht 67.0 in | Wt 192.2 lb

## 2016-05-08 DIAGNOSIS — J471 Bronchiectasis with (acute) exacerbation: Secondary | ICD-10-CM

## 2016-05-08 DIAGNOSIS — A310 Pulmonary mycobacterial infection: Secondary | ICD-10-CM

## 2016-05-08 DIAGNOSIS — J320 Chronic maxillary sinusitis: Secondary | ICD-10-CM

## 2016-05-08 MED ORDER — AMOXICILLIN-POT CLAVULANATE 875-125 MG PO TABS: 1.0000 | ORAL_TABLET | Freq: Two times a day (BID) | ORAL | Status: DC

## 2016-05-08 NOTE — Assessment & Plan Note (Signed)
This has been flaring up recently. He has not been using his saline rinses but he has been compliant with allergy therapy. I wonder if this is representative of a similar cellular process that's happening at the airway level leading to recurrent infections.  Plan: Saline rinses for the next week Continue antihistamine therapy If no improvement then antibiotic therapy

## 2016-05-08 NOTE — Progress Notes (Signed)
Subjective:    Patient ID: Jake Simpler Sr., male    DOB: 1966-10-24, 50 y.o.   MRN: [id number]  Synopsis: Jake Martinez is an alpha-1 carrier who has mild bronchiectasis in the bases of his lungs. He was first evaluated by the [facility] pulmonary clinic in 2012. He then followed up with [facility] for the following 3 years. During that time he continued to have evidence of only mild airflow obstruction on pulmonary function testing. 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 He was treated with one year with fluconazole. He was followed by infectious diseases as well as pulmonary at [facility]. For some reason Roflumilast was attempted during that time and he was quite intolerant of this with a lot of back pain. During that time he was started on inhaled amikacin and he was continued on hypertonic saline. He would have periodic flareups treated with Augmentin. He transferred his care back to the [place] pulmonary in 2015 for convenience because he lives in [place]. He had Mycobacterium avium intracellular grow out of his mucus twice in 2015 2016 so he was started on therapy again in August 2016.  HPI Chief Complaint  Patient presents with  . Follow-up    c/o sinus congestion, hoarseness, prod cough with yellow/brown mucus since yesterday.     Jake Martinez is doing relatively well compared to his usual baseline complaints. He is better than he was since May.  He has had some hoarseness since yesterday. He has some sinus congestion. No fevers, chills weight loss.  His sputum culture back in April showed active MAI. He continues to take his rotating inhaled antibiotics though he stopped taking the inhaled aztreonam.   He continues to take the Symbicort and Spiriva.    Past Medical History  Diagnosis Date  . Alpha-1-antitrypsin deficiency (HCC)   . Bronchiectasis   . Hyperlipidemia   . Chronic headaches   . OSA (obstructive sleep apnea)   . Cryptococcal pneumonitis (HCC)  01-2013     Review of Systems  Constitutional: Negative for fever, chills, activity change, appetite change and fatigue.  HENT: Negative for congestion, ear pain, hearing loss, postnasal drip, rhinorrhea, sinus pressure and sneezing.   Eyes: Negative for redness, itching and visual disturbance.  Respiratory: Positive for cough and shortness of breath. Negative for chest tightness and wheezing.   Cardiovascular: Negative for chest pain, palpitations and leg swelling.       Objective:   Physical Exam Filed Vitals:   05/08/16 1408  BP: 134/70  Pulse: 76  Height:  (1.702 m)  Weight: 192 lb 3.2 oz (87.181 kg)  SpO2: 94%   RA  Gen:  no acute distress HEENT: NCAT, EOMi, OP clear PULM: rhonchi upper lobes, good air movement CV: RRR, no mgr, no JVD AB: BS+, soft, nontender,  Ext: warm, no edema, no clubbing, no cyanosis Derm: no rash or skin breakdown Neuro: A&Ox4, MAEW   November 2012 pulmonary function test at [facility] ratio 89%, FEV1 2.8 L (82% predicted, no change with bronchodilator) no lung volumes or DLCO performed October 2015 PFT > Ratio 87% FEV1 2.68 L (81% , 15% change with BD), TLC 5.25 L (86% pred), DLCO 22.2 (84% pred)  Respiratory culture results from [facility]: March 2014 cryptococcus July 2014 aspergillus [place] October 2014 aspergillus [place] October 2015 penicillium species October 2014 Actinomadura October 2014 Streptomyces January 2015 Actinomadura August 2015 Mycobacterium avium complex August 2015 Actinomadura October 2015 haemophilus species October 2015 "mold"  10/2014  Sputum culture > strep pneumo 10/2014 Sputum Fungal> fungus isolated, speciation pending > not crypto 10/2014 Sputum AFB> negative  01/2015 AFB culture > MAI 01/2015 Fungal culture> negative  05/2015 AFB Culture > negative 05/2015 fungal culture > candida  09/2015 AFB culture > negative 09/2015 fungal culture > candida  03/2016 AFB culture > MAI  Notes from our nurse practitioner  reviewed where the Rifampin was held due to a side effect     Assessment & Plan:   Chronic sinusitis This has been flaring up recently. He has not been using his saline rinses but he has been compliant with allergy therapy. I wonder if this is representative of a similar cellular process that's happening at the airway level leading to recurrent infections.  Plan: Saline rinses for the next week Continue antihistamine therapy If no improvement then antibiotic therapy    MAI (mycobacterium avium-intracellulare) (HCC) Jake Martinez has been intolerant of rifampin but he's been able to tolerate to drug therapy. It seems that he was able to tolerate 3 drug therapy for approximately 6 months prior to having to stop the rifampin. Unfortunately, he is currently still MAI positive. Even more unfortunate, we do not have sensitivities on the most recent AFB sample.  Plan: Re-collect AFB and sent for sensitivity analysis Sent back to infectious diseases, question does see need to be on a different regimen   Bronchiectasis He continues to struggle with daily mucus congestion.  Honestly, I don't really understand the cause of Chanc recurrent infections.  He does have some mild bronchiectasis in the right lower lobe seen on his CT chest from 2016, but it's hard for me to imagine that that the explanation for his multiple recent infections in the last 4-5 years as detailed in the note above.  I think we need to focus more on mucociliary clearance measures.  Plan: Add therapy vest Continue [place] every other month, this was started by [facility] and he seems to think that this is been really helpful Stop Spiriva Continue Symbicort  > 50% of today's 45 minute visit spent face to face    Updated Medication List Outpatient Encounter Prescriptions as of 05/08/2016  Medication Sig  . acetaminophen (TYLENOL) 325 MG tablet Take 650 mg by mouth every 6 (six) hours as needed.  Marland Kitchen albuterol (PROVENTIL HFA) 108 (90  Base) MCG/ACT inhaler Inhale 2 puffs into the lungs every 6 (six) hours as needed.  Marland Kitchen albuterol (PROVENTIL) (2.5 MG/3ML) 0.083% nebulizer solution 1 vial in nebulizer three times daily with sodium chloride  . Aztreonam Lysine (CAYSTON) 75 MG SOLR Inhale 75 mg into the lungs 3 (three) times daily. Use for 28 days, off for 28 days.  . Aztreonam Lysine 75 MG SOLR Inhale 75 mg into the lungs 3 (three) times daily.  . budesonide-formoterol (SYMBICORT) 160-4.5 MCG/ACT inhaler Inhale 2 puffs into the lungs 2 (two) times daily.  . cetirizine (ZYRTEC) 10 MG tablet Take 1 tablet (10 mg total) by mouth daily. Alternates with allegra 180  . Cholecalciferol (VITAMIN D) 2000 UNITS CAPS Take 1 capsule by mouth daily.    . clarithromycin (BIAXIN) 250 MG tablet Take 4 tablets (1,000 mg total) by mouth every Monday, Wednesday, and Friday.  . ethambutol (MYAMBUTOL) 400 MG tablet Take 5 tablets (2,000 mg total) by mouth every Monday, Wednesday, and Friday.  . fluticasone (FLONASE) 50 MCG/ACT nasal spray Place 2 sprays into both nostrils daily.  Marland Kitchen levofloxacin (LEVAQUIN) 750 MG tablet Take 1 tablet (750 mg total) by mouth daily.  Marland Kitchen  montelukast (SINGULAIR) 10 MG tablet Take 1 tablet (10 mg total) by mouth at bedtime.  Marland Kitchen nystatin (MYCOSTATIN) 100000 UNIT/ML suspension Take 5 mLs (500,000 Units total) by mouth as needed.  Marland Kitchen omeprazole (PRILOSEC) 20 MG capsule Take 40 mg by mouth daily.  . Probiotic Product (ALIGN) 4 MG CAPS Take 1 capsule (4 mg total) by mouth daily.  . rizatriptan (MAXALT) 10 MG tablet Take 10 mg by mouth as needed for migraine. May repeat in 2 hours if needed  . Sodium Chloride, Inhalant, 7 % NEBU Inhale 1 vial into the lungs 3 (three) times daily. With albuterol   . Sodium Chloride-Sodium Bicarb (AYR SALINE NASAL NETI RINSE) 1.57 g PACK Use twice per day  . topiramate (TOPAMAX) 100 MG tablet Take 100 mg by mouth daily.  . [DISCONTINUED] tiotropium (SPIRIVA) 18 MCG inhalation capsule Place 1 capsule  (18 mcg total) into inhaler and inhale daily.  Marland Kitchen amoxicillin-clavulanate (AUGMENTIN) 875-125 MG tablet Take 1 tablet by mouth 2 (two) times daily.  . [DISCONTINUED] rifabutin (MYCOBUTIN) 150 MG capsule Take 2 capsules (300 mg total) by mouth daily. (Patient not taking: Reported on 05/08/2016)  . [DISCONTINUED] rifabutin (MYCOBUTIN) 150 MG capsule Take 2 tablets by mouth daily (Patient not taking: Reported on 05/08/2016)  . [DISCONTINUED] rifampin (RIFADIN) 300 MG capsule Take 2 capsules (600 mg total) by mouth every Monday, Wednesday, and Friday. (Patient not taking: Reported on 04/02/2016)   No facility-administered encounter medications on file as of 05/08/2016.

## 2016-05-08 NOTE — Assessment & Plan Note (Signed)
Jake Martinez has been intolerant of rifampin but he's been able to tolerate to drug therapy. It seems that he was able to tolerate 3 drug therapy for approximately 6 months prior to having to stop the rifampin. Unfortunately, he is currently still MAI positive. Even more unfortunate, we do not have sensitivities on the most recent AFB sample.  Plan: Re-collect AFB and sent for sensitivity analysis Sent back to infectious diseases, question does see need to be on a different regimen

## 2016-05-08 NOTE — Assessment & Plan Note (Signed)
He continues to struggle with daily mucus congestion.  Honestly, I don't really understand the cause of Jake Martinez recurrent infections.  He does have some mild bronchiectasis in the right lower lobe seen on his CT chest from 2016, but it's hard for me to imagine that that the explanation for his multiple recent infections in the last 4-5 years as detailed in the note above.  I think we need to focus more on mucociliary clearance measures.  Plan: Add therapy vest Continue [place] every other month, this was started by [facility] and he seems to think that this is been really helpful Stop Spiriva Continue Symbicort  > 50% of today's 45 minute visit spent face to face

## 2016-05-08 NOTE — Patient Instructions (Addendum)
Please provide [place] with another sample of your mucus Use the Lloyd Huger med saline rinses for the next few weeks If your sinus symptoms, hoarseness, and cough do not improve in the next 48 hours then take the antibiotic I prescribed Keep taking your inhaled medicines as you're doing We will prescribe a therapy vest from [facility] for you to use twice a day for your bronchiectasis We will see you back in 2 months or sooner if needed

## 2016-05-08 NOTE — Addendum Note (Signed)
Addended by: Velvet Bathe on: 05/08/2016 03:37 PM   Modules accepted: Orders

## 2016-05-11 LAB — RESPIRATORY CULTURE OR RESPIRATORY AND SPUTUM CULTURE: Organism ID, Bacteria: NORMAL

## 2016-05-12 ENCOUNTER — Telehealth: Payer: Self-pay | Admitting: Pulmonary Disease

## 2016-05-12 NOTE — Telephone Encounter (Signed)
Spoke with Melissa/[facility] - states that the patient does not have insurance and the vest will cost $1200/mo out of pocket to rent the Afflovest from [facility]  Please advise Dr Kendrick Fries. Thanks.

## 2016-05-13 NOTE — Telephone Encounter (Signed)
Spoke with pt, aware of recs.  Will keep [place] updated on the status of his vest through his PCP in [place].  Nothing further needed.

## 2016-05-13 NOTE — Telephone Encounter (Signed)
He and his wife said they would be willing to see if their physician in [place] could accommodate this

## 2016-05-28 ENCOUNTER — Encounter: Payer: Self-pay | Admitting: Pharmacist

## 2016-06-04 ENCOUNTER — Encounter: Payer: Self-pay | Admitting: Pharmacist

## 2016-06-05 ENCOUNTER — Telehealth: Payer: Self-pay | Admitting: Pulmonary Disease

## 2016-06-05 NOTE — Telephone Encounter (Signed)
Ash- I called this pt and he states that you called this am and left msg about some sort of application Did you call him?  Please advise if there is something triage can help with, thanks!

## 2016-06-05 NOTE — Telephone Encounter (Signed)
Spoke with pt, advised that I've mailed [place] patient assistance forms to his home.  Pt expressed understanding.  Nothing further needed.

## 2016-06-22 LAB — AFB CULTURE WITH SMEAR (NOT AT ARMC)

## 2016-06-22 LAB — FUNGUS CULTURE W SMEAR

## 2016-07-03 ENCOUNTER — Other Ambulatory Visit: Payer: Self-pay | Admitting: Pulmonary Disease

## 2016-07-07 ENCOUNTER — Ambulatory Visit (INDEPENDENT_AMBULATORY_CARE_PROVIDER_SITE_OTHER): Payer: Self-pay | Admitting: Pulmonary Disease

## 2016-07-07 ENCOUNTER — Encounter: Payer: Self-pay | Admitting: Pulmonary Disease

## 2016-07-07 DIAGNOSIS — J471 Bronchiectasis with (acute) exacerbation: Secondary | ICD-10-CM

## 2016-07-07 DIAGNOSIS — J320 Chronic maxillary sinusitis: Secondary | ICD-10-CM

## 2016-07-07 DIAGNOSIS — E8801 Alpha-1-antitrypsin deficiency: Secondary | ICD-10-CM

## 2016-07-07 DIAGNOSIS — A310 Pulmonary mycobacterial infection: Secondary | ICD-10-CM

## 2016-07-07 DIAGNOSIS — K219 Gastro-esophageal reflux disease without esophagitis: Secondary | ICD-10-CM

## 2016-07-07 MED ORDER — FAMOTIDINE 20 MG PO TABS: 20.0000 mg | ORAL_TABLET | Freq: Every day | ORAL | 5 refills | Status: DC

## 2016-07-07 NOTE — Assessment & Plan Note (Signed)
Contributing to his cough. Take Pepcid in the evening in addition to omeprazole during the daytime.

## 2016-07-07 NOTE — Assessment & Plan Note (Signed)
Fortunately his July 2017 AFB culture was negative.  He was intolerant of rifampin.  Plan: Continue clarithromycin and ethambutol through July 2018

## 2016-07-07 NOTE — Progress Notes (Signed)
Subjective:    Patient ID: Jake Simpler Sr., male    DOB: 22-May-1966, 50 y.o.   MRN: [id number]  Synopsis: Jake Martinez is an alpha-1 carrier who has mild bronchiectasis in the bases of his lungs. He was first evaluated by the [facility] pulmonary clinic in 2012. He then followed up with [facility] for the following 3 years. During that time he continued to have evidence of only mild airflow obstruction on pulmonary function testing. 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 He was treated with one year with fluconazole. He was followed by infectious diseases as well as pulmonary at [facility]. For some reason Roflumilast was attempted during that time and he was quite intolerant of this with a lot of back pain. During that time he was started on inhaled amikacin and he was continued on hypertonic saline. He would have periodic flareups treated with Augmentin. He transferred his care back to the [place] pulmonary in 2015 for convenience because he lives in [place]. He had Mycobacterium avium intracellular grow out of his mucus twice in 2015 2016 so he was started on therapy again in August 2016.  He was unable to tolerate three drug therapy and in 2017 he had to change to Ethambutol and Clarithromycin alone and drop rifampin.  As of July 2017 his sputum AFB culture was negatove. He has OSA on CPAP.     HPI Chief Complaint  Patient presents with  . Follow-up    pt requesting a letter listing all of his diagnoses.  Pt c/o wheezing, prod cough with yellow mucus.     Jake Martinez has been OK. He still has yellow mucus production. He has been using the [place]. He has intermittent coughing spells with wheezing which can be worse on some days more than others. A few days ago his cough was a little worse.  Sometimes the cough is worse when he is lying flat. He still has problems with sinus drainage, he continues to take his allergy pills (sinuglair and cetirizine) and flonase 2  puffs each nare bid.  He uses Lloyd Huger Med rinses prn.    Past Medical History:  Diagnosis Date  . Alpha-1-antitrypsin deficiency (HCC)   . Bronchiectasis   . Chronic headaches   . Cryptococcal pneumonitis (HCC) 01-2013  . Hyperlipidemia   . OSA (obstructive sleep apnea)      Review of Systems  Constitutional: Negative for activity change, appetite change, chills, fatigue and fever.  HENT: Negative for congestion, ear pain, hearing loss, postnasal drip, rhinorrhea, sinus pressure and sneezing.   Eyes: Negative for redness, itching and visual disturbance.  Respiratory: Positive for cough and shortness of breath. Negative for chest tightness and wheezing.   Cardiovascular: Negative for chest pain, palpitations and leg swelling.       Objective:   Physical Exam Vitals:   07/07/16 1338  BP: 128/74  BP Location: Left Arm  Cuff Size: Normal  Pulse: 71  SpO2: 96%  Weight: 198 lb 9.6 oz (90.1 kg)  Height: 5\' 7"  (1.702 m)   RA  Gen:  no acute distress HEENT: NCAT, EOMi, OP clear PULM: rhonchi some wheezing CV: RRR, no mgr, no JVD AB: BS+, soft, nontender,  Ext: warm, no edema, no clubbing, no cyanosis Derm: no rash or skin breakdown Neuro: A&Ox4, MAEW   November 2012 pulmonary function test at [facility] ratio 89%, FEV1 2.8 L (82% predicted, no change with bronchodilator) no lung volumes or DLCO performed October 2015  PFT > Ratio 87% FEV1 2.68 L (81% , 15% change with BD), TLC 5.25 L (86% pred), DLCO 22.2 (84% pred)  Respiratory [facility]: March 2014 cryptococcus July 2014 aspergillus [place] October 2014 aspergillus [place] October 2015 penicillium species October 2014 Actinomadura October 2014 Streptomyces January 2015 Actinomadura August 2015 Mycobacterium avium complex August 2015 Actinomadura October 2015 haemophilus species October 2015 "mold"  10/2014 Sputum culture > strep pneumo 10/2014 Sputum Fungal> fungus isolated, speciation pending > not  crypto 10/2014 Sputum AFB> negative  01/2015 AFB culture > MAI 01/2015 Fungal culture> negative  05/2015 AFB Culture > negative 05/2015 fungal culture > candida  09/2015 AFB culture > negative 09/2015 fungal culture > candida  03/2016 AFB culture > MAI  05/2016 AFB culture > negative      Assessment & Plan:   Bronchiectasis (HCC) This has been a stable interval for Jake Martinez. Unfortunately he continues to have chronic symptoms which are best controlled with antibacterial therapy. Fortunately though he has not had an exacerbation recently.  Plan: We will apply for a therapy vest once he gets financial assistance from [facility] [place] Continue every other month [place] Continue Symbicort Continue albuterol as needed Flu shot in the fall  Alpha-1-antitrypsin carrier As noted previously, no indication for treatment at this time.  Chronic sinusitis He was instructed today to decrease his Flonase dose to the standard dose which is 2 sprays each nostril daily, he will continue Singulair and Zyrtec.   MAI (mycobacterium avium-intracellulare) [facility](HCC) Fortunately his July 2017 AFB culture was negative.  He was intolerant of rifampin.  Plan: Continue clarithromycin and ethambutol through July 2018  GERD (gastroesophageal reflux disease) Contributing to his cough. Take Pepcid in the evening in addition to omeprazole during the daytime.   Updated Medication List Outpatient Encounter Prescriptions as of 07/07/2016  Medication Sig  . acetaminophen (TYLENOL) 325 MG tablet Take 650 mg by mouth every 6 (six) hours as needed.  Marland Kitchen. albuterol (PROVENTIL HFA) 108 (90 Base) MCG/ACT inhaler Inhale 2 puffs into the lungs every 6 (six) hours as needed.  Marland Kitchen. albuterol (PROVENTIL) (2.5 MG/3ML) 0.083% nebulizer solution 1 vial in nebulizer three times daily with sodium chloride  . Aztreonam Lysine (CAYSTON) 75 MG SOLR Inhale 75 mg into the lungs 3 (three) times daily. Use for 28 days, off for 28 days.  .  Aztreonam Lysine 75 MG SOLR Inhale 75 mg into the lungs 3 (three) times daily.  . budesonide-formoterol (SYMBICORT) 160-4.5 MCG/ACT inhaler Inhale 2 puffs into the lungs 2 (two) times daily.  . cetirizine (ZYRTEC) 10 MG tablet Take 1 tablet (10 mg total) by mouth daily. Alternates with allegra 180  . Cholecalciferol (VITAMIN D) 2000 UNITS CAPS Take 1 capsule by mouth daily.    . clarithromycin (BIAXIN) 250 MG tablet Take 4 tablets (1,000 mg total) by mouth every Monday, Wednesday, and Friday.  . ethambutol (MYAMBUTOL) 400 MG tablet Take 5 tablets (2,000 mg total) by mouth every Monday, Wednesday, and Friday.  . fluticasone (FLONASE) 50 MCG/ACT nasal spray Place 2 sprays into both nostrils daily.  . montelukast (SINGULAIR) 10 MG tablet Take 1 tablet (10 mg total) by mouth at bedtime.  Marland Kitchen. nystatin (MYCOSTATIN) 100000 UNIT/ML suspension Take 5 mLs (500,000 Units total) by mouth as needed.  Marland Kitchen. omeprazole (PRILOSEC) 20 MG capsule Take 40 mg by mouth daily.  . Probiotic Product (ALIGN) 4 MG CAPS Take 1 capsule (4 mg total) by mouth daily.  . rizatriptan (MAXALT) 10 MG tablet Take 10  mg by mouth as needed for migraine. May repeat in 2 hours if needed  . Sodium Chloride, Inhalant, 7 % NEBU Inhale 1 vial into the lungs 3 (three) times daily. With albuterol   . Sodium Chloride-Sodium Bicarb (AYR SALINE NASAL NETI RINSE) 1.57 g PACK Use twice per day  . topiramate (TOPAMAX) 100 MG tablet Take 100 mg by mouth daily.  . famotidine (PEPCID) 20 MG tablet Take 1 tablet (20 mg total) by mouth at bedtime.  . [DISCONTINUED] amoxicillin-clavulanate (AUGMENTIN) 875-125 MG tablet Take 1 tablet by mouth 2 (two) times daily. (Patient not taking: Reported on 07/07/2016)  . [DISCONTINUED] levofloxacin (LEVAQUIN) 750 MG tablet Take 1 tablet (750 mg total) by mouth daily. (Patient not taking: Reported on 07/07/2016)   No facility-administered encounter medications on file as of 07/07/2016.

## 2016-07-07 NOTE — Patient Instructions (Signed)
Keep taking your medications as you are doing When you have financial assistance from [facility] let us know that we can order the therapy vest I will see you back in 3-4 months or sooner if needed

## 2016-07-07 NOTE — Assessment & Plan Note (Signed)
This has been a stable interval for Jake Martinez. Unfortunately he continues to have chronic symptoms which are best controlled with antibacterial therapy. Fortunately though he has not had an exacerbation recently.  Plan: We will apply for a therapy vest once he gets financial assistance from [facility] [place] Continue every other month [place] Continue Symbicort Continue albuterol as needed Flu shot in the fall

## 2016-07-07 NOTE — Assessment & Plan Note (Signed)
As noted previously, no indication for treatment at this time.

## 2016-07-07 NOTE — Assessment & Plan Note (Signed)
He was instructed today to decrease his Flonase dose to the standard dose which is 2 sprays each nostril daily, he will continue Singulair and Zyrtec.

## 2016-07-08 ENCOUNTER — Telehealth: Payer: Self-pay | Admitting: Pulmonary Disease

## 2016-07-08 NOTE — Telephone Encounter (Signed)
Pt calling requesting a prescription for Allegra and Pepcid - states that they will be cheaper through insurance than OTC. Pt aware that I will have to speak with Dr Kendrick Fries to get the okay to call in the Allegra as we have him taking Zyrtec and Singulair. Pt states that he alternates the Zyrtec with Allegra and the Allegra seems to work better for his symptoms. Please advise BQ if okay to prescribe the Allegra. Thanks.   Needs both Pepcid and Allegra sent to the pharmacy.

## 2016-07-09 ENCOUNTER — Telehealth: Payer: Self-pay | Admitting: Pulmonary Disease

## 2016-07-09 MED ORDER — FAMOTIDINE 20 MG PO TABS: 20.0000 mg | ORAL_TABLET | Freq: Two times a day (BID) | ORAL | 5 refills | Status: DC

## 2016-07-09 MED ORDER — FEXOFENADINE HCL 180 MG PO TABS: 180.0000 mg | ORAL_TABLET | Freq: Two times a day (BID) | ORAL | 5 refills | Status: DC

## 2016-07-09 NOTE — Telephone Encounter (Signed)
Pt's wife calling back to check status of RX.   BQ please advise if you're ok with below stated med change.  Thanks!

## 2016-07-09 NOTE — Telephone Encounter (Signed)
Called and they are now closed. WCB in AM 

## 2016-07-09 NOTE — Telephone Encounter (Signed)
Stop zyrtec Start allegra bid Pepcid 20mg  bid Keep singulair

## 2016-07-09 NOTE — Telephone Encounter (Signed)
Spoke with pt. He is aware of BQ's recommendations. Rxs have been sent in. Nothing further was needed. 

## 2016-07-10 ENCOUNTER — Telehealth: Payer: Self-pay | Admitting: Pulmonary Disease

## 2016-07-10 NOTE — Telephone Encounter (Signed)
Called and left message for pharmacy to call back.

## 2016-07-13 NOTE — Telephone Encounter (Signed)
Office does not open until 9am. Will route to triage for follow up.

## 2016-07-13 NOTE — Telephone Encounter (Signed)
Spoke with pt and his wife. They were very upset that this paperwork has to filled out. Pt's wife states that they already did this and mailed it back to us. Morrie Sheldonshley - do you have these forms?

## 2016-07-13 NOTE — Telephone Encounter (Signed)
Yes, we need to arrange this. I thought this was artery performed by his doctors at [facility]. Please try to find out about that

## 2016-07-13 NOTE — Telephone Encounter (Signed)
Called and spoke with a representative. They were wanting to know if we were going to re-enroll the pt for assistance [place]. His current enrollment ends on 07/30/16.  BQ- is the pt going to be continuing this medication? Thanks.

## 2016-07-13 NOTE — Telephone Encounter (Signed)
[place] patient assistance forms were received via fax from [employer] this morning.. Forms filled out to best of my ability and placed in BQ's folder for completion and signature.   Will hold in my box to follow up on.

## 2016-07-13 NOTE — Telephone Encounter (Signed)
Spoke with Baxter HireKristen at [facility] in [place]. They had a question about the prescription that was sent in for Allergra BID. Advised her that this was correct. BQ advised him to take this BID. Nothing further was needed.

## 2016-07-15 ENCOUNTER — Telehealth: Payer: Self-pay | Admitting: Pulmonary Disease

## 2016-07-15 NOTE — Telephone Encounter (Signed)
Call [phone] to see if we received the [place] patient assistance forms the patient completed and mailed about 4 weeks ago.  Advised her we recd assistance froms from [place] this am.  Please call and advise if we recd patient's part and if they need to do anything further.

## 2016-07-15 NOTE — Telephone Encounter (Signed)
I have not yet received any forms regarding this device, or any forms from [facility] regarding this patient.

## 2016-07-15 NOTE — Telephone Encounter (Signed)
[place] - do you know if pt's portion has been completed? Thanks!

## 2016-07-15 NOTE — Telephone Encounter (Signed)
Forms have been filled out and received by BQ.  Pt portion has not been filled out on our paperwork.   Called [employer] [facility] Pt assistance and spoke with Barbara CowerJason, who advised that the pt's received a form also, and that we just need to fill out our portion of this form and fax it back.  This has been faxed.

## 2016-07-15 NOTE — Telephone Encounter (Signed)
[facility] has a mask that goes with nebulizer that makes you cough instead of a vest.  They do not carry the vest.    Patient states that she filled out form and mailed it back to our office and that she is going to send proof of income.  She also said that [facility] to Dr. Kendrick FriesMcQuaid and they need that faxed back to them as well.    Morrie Sheldonshley do you have these papers?  Please advise.

## 2016-07-16 NOTE — Telephone Encounter (Signed)
Morrie Sheldon do you still have a copy of these forms?

## 2016-07-16 NOTE — Telephone Encounter (Signed)
Spoke with Tori @ [employer]. She states that they do need patient signature in order to enroll him in the [employer] that goes along with the Patient Assistance Program. She only needed a verbal consent to reach out to patient and get his permission to enroll him in the Access Program. I gave verbal consent to contact patient. Also advised her that pt does not have insurance to file. She said that was all they needed.   Morrie Sheldon - FYI

## 2016-07-16 NOTE — Telephone Encounter (Signed)
I still have these forms in my cubby. The patient does not have insurance, and the patient has not signed the forms before being faxed because as stated below in yesterday's documentation, Barbara Cower from [facility] advised that the pt received his own set of forms and we did not need to get our copy to the patient for signature before faxing back.

## 2016-07-16 NOTE — Telephone Encounter (Signed)
Morrie Sheldonshley can we close this message.  thanks

## 2016-07-16 NOTE — Telephone Encounter (Signed)
Alyssa from [employer] program called and states that they have received the enrollment form but they need to verify patient's insurance and the patient signature and date is missing. She can be reached at [phone]

## 2016-07-20 NOTE — Telephone Encounter (Signed)
lmtcb x1 for pt's wife. 

## 2016-07-21 NOTE — Telephone Encounter (Signed)
I do not have any forms regarding any DME for patient. Called pt, the forms he speaks of is his [place] forms- see other phone note.  Pt states that [facility] cannot provide [facility] with a vibratory vest, but has a "mask" (asked pt's wife to explain the mask-she described it as attaching to his nebulizer and makes him cough)- and that if BQ writes a rx for it that [facility] would [facility] it through his patient assistance.  BQ please advise if this would work for pt.  Thanks.

## 2016-07-21 NOTE — Telephone Encounter (Signed)
Happy to prescribe but I need to know the name of the device.

## 2016-07-21 NOTE — Telephone Encounter (Signed)
Spoke with pt's wife. She states that the forms that needed to be filled out she has completed them herself. She did state that she spoke with Morrie Sheldon yesterday about some other forms that needed to be signed by BQ. Morrie Sheldon do you have these forms?

## 2016-07-23 NOTE — Telephone Encounter (Signed)
lmtcb x1 for pt's wife. 

## 2016-07-27 MED ORDER — AMOXICILLIN-POT CLAVULANATE 875-125 MG PO TABS: 1.0000 | ORAL_TABLET | Freq: Two times a day (BID) | ORAL | 0 refills | Status: DC

## 2016-07-27 NOTE — Telephone Encounter (Signed)
Called and spoke with pt and he stated that he did not know the name of the mask---gave me the number to call  To [facility] home health---[phone]--the office does not open until 830.  Will need to call back to get the name of the mask the pt is requesting.      Pt stated that he has been taking the reflux meds given by BQ but he stated that in the last 3 days his wheezing is worse and feels that he may need an abx.  BQ please advise. thanks

## 2016-07-27 NOTE — Telephone Encounter (Signed)
augmentin 875 bid x5 days; hold all other ORAL (not inhaled) antibiotics while taking this

## 2016-07-27 NOTE — Telephone Encounter (Signed)
Spoke with pt. He is aware of BQ's recommendation. Rx has been sent in.  I called [facility] I could not get in touch with anyone and I could not leave a message. Will try back.

## 2016-07-30 ENCOUNTER — Other Ambulatory Visit: Payer: Self-pay | Admitting: Pulmonary Disease

## 2016-07-30 ENCOUNTER — Telehealth: Payer: Self-pay | Admitting: Pulmonary Disease

## 2016-07-30 MED ORDER — CLARITHROMYCIN 250 MG PO TABS: 1000.0000 mg | ORAL_TABLET | ORAL | 1 refills | Status: AC

## 2016-07-30 MED ORDER — ETHAMBUTOL HCL 400 MG PO TABS: 2000.0000 mg | ORAL_TABLET | ORAL | 1 refills | Status: AC

## 2016-07-30 NOTE — Telephone Encounter (Signed)
Spoke with Spouse. Rx's sent in. Nothing further needed.

## 2016-08-10 ENCOUNTER — Telehealth: Payer: Self-pay | Admitting: Pulmonary Disease

## 2016-08-10 NOTE — Telephone Encounter (Signed)
lmtcb for pt's wife, Lupita LeashDonna.

## 2016-08-11 ENCOUNTER — Ambulatory Visit (INDEPENDENT_AMBULATORY_CARE_PROVIDER_SITE_OTHER): Payer: Self-pay | Admitting: Adult Health

## 2016-08-11 ENCOUNTER — Encounter: Payer: Self-pay | Admitting: Adult Health

## 2016-08-11 DIAGNOSIS — J471 Bronchiectasis with (acute) exacerbation: Secondary | ICD-10-CM

## 2016-08-11 MED ORDER — PREDNISONE 10 MG PO TABS: ORAL_TABLET | ORAL | 0 refills | Status: DC

## 2016-08-11 MED ORDER — AMOXICILLIN-POT CLAVULANATE 875-125 MG PO TABS: 1.0000 | ORAL_TABLET | Freq: Two times a day (BID) | ORAL | 0 refills | Status: AC

## 2016-08-11 NOTE — Telephone Encounter (Signed)
FYI:  Pt wanted appt today .  Added to Tammy Parrett's schedule.

## 2016-08-11 NOTE — Telephone Encounter (Signed)
Needs OV.  

## 2016-08-11 NOTE — Telephone Encounter (Signed)
Spoke with pt.  Finished Augmentin yesterday.  Still c/o wheezing, prod cough the same as usual but now has increase in left ear congestion and pressure.  Taking Allegra bid and Singulair at bedtime and Flonase daily. Pt's wife asked if [facility] can help or if she should call Primary MD about this.  Please advise  Also spoke with Rosetta at [facility] health and asked about "mask" that pt is referring to to assist with cough.  She has sent an email to all RT's in the office to see if they can call us back regarding this.

## 2016-08-11 NOTE — Patient Instructions (Addendum)
Extend Augmentin for 5 days .take with food, take probiotic while on antibiotic.  Mucinex DM Twice daily  As needed  Cough/congestion .  Saline nasal rinses As needed   Prednisone taper over next week.  Follow up Dr. Kendrick Fries and As needed   Please contact office for sooner follow up if symptoms do not improve or worsen or seek emergency care

## 2016-08-11 NOTE — Progress Notes (Signed)
Subjective:    Patient ID: Jake SimplerJames [name] Curd Sr., male    DOB: 08/17/1966, 50 y.o.   MRN: [id number]  HPI 50 yo male Never smoker with alpha one carrier (MZ w/ low level ~78) , who has mild bronchiectasis in lung bases followed by .Dr. Kendrick FriesMcQuaid   Synopsis :  Synopsis: Mr. [place] is an alpha-1 carrier who has mild bronchiectasis in the bases of his lungs. He was first evaluated by the [facility] in 2012. [facility] then [facility] the following [facility] years. During that time he continued to have evidence of only mild airflow obstruction on pulmonary function testing. 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 He was treated with one year with fluconazole. He was followed by infectious diseases as well as pulmonary [facility]. For some reason Roflumilast was attempted during that time and he was quite intolerant of this with a lot of back pain. During that time he was started on inhaled amikacin and he was continued on hypertonic saline. He would have periodic flareups treated with Augmentin. He transferred his care back to the [facility] pulmonary in 2015 for convenience because he lives in [place]. He had Mycobacterium avium intracellular grow out of his mucus twice in 2015 2016 so he was started on therapy again in August 2016.  08/11/2016 Acute OV  : Bronchiectasis /MAC  Patient presents for an acute office visit. Complains of of 1-2 weeks of increased cough, congestion , sinus pressure and pain. Now left ear if hurting. Was started on Augmentin for 5 days , last dose yesterday. No fever, orthopnea, edema or hemoptysis.  Remains on [place] neb Three times a day  For 1 month and then off 1 month On Ethambutol and biaxin.  Previously on Tobramycin neb but has been off for a while.  He does not have any insurance . Has several med programs that he gets assistance through . He was unable to get VEST due to cost issues.  Denies chest pain, orthopnea, PND, or  increased leg swelling. No hemoptysis, rash , or diarrhea. Most recent CT in 01/2015 report reviewed which did not show ILD changes, bronchiectatic changes.   Past Medical History:  Diagnosis Date  . Alpha-1-antitrypsin deficiency (HCC)   . Bronchiectasis   . Chronic headaches   . Cryptococcal pneumonitis (HCC) 01-2013  . Hyperlipidemia   . OSA (obstructive sleep apnea)    Current Outpatient Prescriptions on File Prior to Visit  Medication Sig Dispense Refill  . acetaminophen (TYLENOL) 325 MG tablet Take 650 mg by mouth every 6 (six) hours as needed.    Marland Kitchen. albuterol (PROVENTIL HFA) 108 (90 Base) MCG/ACT inhaler Inhale 2 puffs into the lungs every 6 (six) hours as needed. 8 g 3  . albuterol (PROVENTIL) (2.5 MG/3ML) 0.083% nebulizer solution 1 vial in nebulizer three times daily with sodium chloride 75 mL 3  . Aztreonam Lysine (CAYSTON) 75 MG SOLR Inhale 75 mg into the lungs 3 (three) times daily. Use for 28 days, off for 28 days. 50 mL 3  . budesonide-formoterol (SYMBICORT) 160-4.5 MCG/ACT inhaler Inhale 2 puffs into the lungs 2 (two) times daily. 1 Inhaler 3  . cetirizine (ZYRTEC) 10 MG tablet Take 1 tablet (10 mg total) by mouth daily. Alternates with allegra 180 30 tablet 2  . Cholecalciferol (VITAMIN D) 2000 UNITS CAPS Take 1 capsule by mouth daily.      . clarithromycin (BIAXIN) 250 MG tablet Take 4 tablets (  1,000 mg total) by mouth every Monday, Wednesday, and Friday. 144 tablet 1  . ethambutol (MYAMBUTOL) 400 MG tablet Take 5 tablets (2,000 mg total) by mouth every Monday, Wednesday, and Friday. 180 tablet 1  . famotidine (PEPCID) 20 MG tablet Take 1 tablet (20 mg total) by mouth 2 (two) times daily. 60 tablet 5  . fexofenadine (ALLEGRA ALLERGY) 180 MG tablet Take 1 tablet (180 mg total) by mouth 2 (two) times daily. 60 tablet 5  . fluticasone (FLONASE) 50 MCG/ACT nasal spray Place 2 sprays into both nostrils daily. 16 g 2  . montelukast (SINGULAIR) 10 MG tablet Take 1 tablet (10 mg  total) by mouth at bedtime. 30 tablet 2  . nystatin (MYCOSTATIN) 100000 UNIT/ML suspension Take 5 mLs (500,000 Units total) by mouth as needed. 60 mL 0  . omeprazole (PRILOSEC) 20 MG capsule Take 40 mg by mouth daily.    . rizatriptan (MAXALT) 10 MG tablet Take 10 mg by mouth as needed for migraine. May repeat in 2 hours if needed    . Sodium Chloride, Inhalant, 7 % NEBU Inhale 1 vial into the lungs 3 (three) times daily. With albuterol     . Sodium Chloride-Sodium Bicarb (AYR SALINE NASAL NETI RINSE) 1.57 g PACK Use twice per day 40 each 3  . topiramate (TOPAMAX) 100 MG tablet Take 100 mg by mouth daily.    . Probiotic Product (ALIGN) 4 MG CAPS Take 1 capsule (4 mg total) by mouth daily. (Patient not taking: Reported on 08/11/2016) 30 capsule 5   No current facility-administered medications on file prior to visit.       Review of Systems Constitutional:   No  weight loss, night sweats,  fevers, chills, fatigue, or  lassitude.  HEENT:   No headaches,  Difficulty swallowing,  Tooth/dental problems, or  Sore throat,                No sneezing, itching,  +ear ache, nasal congestion, post nasal drip,   CV:  No chest pain,  Orthopnea, PND, swelling in lower extremities, anasarca, dizziness, palpitations, syncope.   GI  No heartburn, indigestion, abdominal pain, nausea, vomiting, diarrhea, change in bowel habits, loss of appetite, bloody stools.   Resp:    No chest wall deformity  Skin: no rash or lesions.  GU: no dysuria, change in color of urine, no urgency or frequency.  No flank pain, no hematuria   MS:  No joint pain or swelling.  No decreased range of motion.  No back pain.  Psych:  No change in mood or affect. No depression or anxiety.  No memory loss.         Objective:   Physical Exam Vitals:   08/11/16 1650  BP: 124/84  Pulse: 64  Temp: 98 F (36.7 C)  TempSrc: Oral  SpO2: 95%  Weight: 200 lb (90.7 kg)  Height: 5\' 7"  (1.702 m)   GEN: A/Ox3; pleasant , NAD      HEENT:  [place]/AT,  EACs-clear, TMs-left , mild redness and bulging  NOSE-clear, THROAT-clear, no lesions, no postnasal drip or exudate noted.   NECK:  Supple w/ fair ROM; no JVD; normal carotid impulses w/o bruits; no thyromegaly or nodules palpated; no lymphadenopathy.    RESP  Few rhonchi ,  no accessory muscle use, no dullness to percussion  CARD:  RRR, no m/r/g  , no peripheral edema, pulses intact, no cyanosis or clubbing.  GI:   Soft & nt; nml bowel sounds;  no organomegaly or masses detected.   Musco: Warm bil, no deformities or joint swelling noted.   Neuro: alert, no focal deficits noted.    Skin: Warm, no lesions or rashes  Alexas Basulto NP-C  [facility]  08/11/2016       Assessment & Plan:

## 2016-08-11 NOTE — Telephone Encounter (Signed)
Patient wife called back - she said to call back patient at [phone]

## 2016-08-12 NOTE — Assessment & Plan Note (Signed)
Slow to resolve flare with sinusitis    Plan  Patient Instructions  Extend Augmentin for 5 days .take with food, take probiotic while on antibiotic.  Mucinex DM Twice daily  As needed  Cough/congestion .  Saline nasal rinses As needed   Prednisone taper over next week.  Follow up Dr. Kendrick Fries and As needed   Please contact office for sooner follow up if symptoms do not improve or worsen or seek emergency care

## 2016-08-12 NOTE — Progress Notes (Signed)
Reviewed and discussed with Tammy Parrett in clinic.  Agree with this plan of care.

## 2016-08-17 ENCOUNTER — Telehealth: Payer: Self-pay | Admitting: Pulmonary Disease

## 2016-08-17 NOTE — Telephone Encounter (Signed)
These forms were received, filled out by both pt and BQ, and faxed back on Friday.   Do these need to be refaxed?

## 2016-08-17 NOTE — Telephone Encounter (Signed)
Can the forms be refaxed?  thanks

## 2016-08-17 NOTE — Telephone Encounter (Signed)
I have looked in BQ's look at. These forms are not located there. Morrie Sheldonshley do you have these forms? Do we need to have them sent again? Thanks.

## 2016-08-18 NOTE — Telephone Encounter (Signed)
Forms have been refaxed, and pt's [facility] application has been approved from 08/17/16-08/17/17.  Nothing further needed.

## 2016-08-19 ENCOUNTER — Telehealth: Payer: Self-pay | Admitting: Pulmonary Disease

## 2016-08-19 NOTE — Telephone Encounter (Signed)
Spoke with rep at [facility]  They needed the form the pt's spouse signed dated  I have done this and faxed back to them and nothing further needed

## 2016-09-18 ENCOUNTER — Other Ambulatory Visit: Payer: Self-pay | Admitting: Pulmonary Disease

## 2016-09-22 ENCOUNTER — Telehealth: Payer: Self-pay | Admitting: Pulmonary Disease

## 2016-09-22 NOTE — Telephone Encounter (Signed)
Did he get better from last month with prolonged abx  Is this all new , any other abx since I last saw him.

## 2016-09-22 NOTE — Telephone Encounter (Signed)
Would be best if he could come into be seen  May need cxr to make sure nothing else if going on.  If he has side pain that is worrisome as well  May need to go to urgent care if we can not work in .  Please contact office for sooner follow up if symptoms do not improve or worsen or seek emergency care

## 2016-09-22 NOTE — Telephone Encounter (Signed)
Called and spoke with pt and he stated that his ears cleared up and the cough, wheezing got some better, but did not go away all together and the cough is way worse now.  He is coughing up the green mucus.    Pt stated that these coughing spells are not the same with his disease.  Pt also c/o of pain in his side, around his liver area.    Please advise. thanks

## 2016-09-22 NOTE — Telephone Encounter (Signed)
Called spoke with patient who c/o increased wheezing, prod cough with yellow/green mucus, increased SOB, some left ear congestion/pressure, increased fatigue x2 weeks Denies any hemoptysis, f/c/s, tightness in chest  BQ is unavailable, will send to [facility] Since pt saw TP most recently on 10.10.17 for acute visit, will route to Tammy Please advise, thank you   [facility] [place]erg[place]  Allergen Reactions  . Codeine Itching  . Daliresp [Roflumilast]     All over body pain  . Rifampin     Chills, shaking, fever.   Per last ov: Patient Instructions  Extend Augmentin for 5 days .take with food, take probiotic while on antibiotic.  Mucinex DM Twice daily  As needed  Cough/congestion .  Saline nasal rinses As needed   Prednisone taper over next week.  Follow up Dr. Kendrick FriesMcQuaid and As needed   Please contact office for sooner follow up if symptoms do not improve or worsen or seek emergency care

## 2016-09-22 NOTE — Telephone Encounter (Signed)
lmtcb x1 for pt. 

## 2016-09-23 NOTE — Telephone Encounter (Signed)
Patient called this morning, scheduled for Friday 11/24 @ 0930.Marland KitchenCharm Rings

## 2016-09-25 ENCOUNTER — Encounter: Payer: Self-pay | Admitting: Internal Medicine

## 2016-09-25 ENCOUNTER — Ambulatory Visit (INDEPENDENT_AMBULATORY_CARE_PROVIDER_SITE_OTHER): Payer: Self-pay | Admitting: Internal Medicine

## 2016-09-25 VITALS — BP 118/66 | HR 80 | Temp 97.6°F | Ht 67.0 in | Wt 202.4 lb

## 2016-09-25 DIAGNOSIS — J471 Bronchiectasis with (acute) exacerbation: Secondary | ICD-10-CM

## 2016-09-25 DIAGNOSIS — J449 Chronic obstructive pulmonary disease, unspecified: Secondary | ICD-10-CM

## 2016-09-25 MED ORDER — LEVOFLOXACIN 750 MG PO TABS: 750.0000 mg | ORAL_TABLET | Freq: Every day | ORAL | 0 refills | Status: DC

## 2016-09-25 MED ORDER — BUDESONIDE-FORMOTEROL FUMARATE 80-4.5 MCG/ACT IN AERO: 2.0000 | INHALATION_SPRAY | Freq: Two times a day (BID) | RESPIRATORY_TRACT | 0 refills | Status: AC

## 2016-09-25 NOTE — Patient Instructions (Addendum)
Levaquin 750  Mg one daily x 7 days and hold clarithromicin while on it   Use the flutter valve as much as you possibly can - ok to cough right into it to prevent the airways from collapsing when you cough, same as the cpap machine   Try change symbicort 80 Take 2 puffs first thing in am and then another 2 puffs about 12 hours later - if helps stay on it   Work on inhaler technique:  relax and gently blow all the way out then take a nice smooth deep breath back in, triggering the inhaler at same time you start breathing in(or with a slight delay if you use the spacer).  Hold for up to 5 seconds if you can. Blow out thru nose. Rinse and gargle with water when done     Follow with Dr Kendrick FriesMcQuaid next available and I will share this note with him

## 2016-09-25 NOTE — Progress Notes (Addendum)
Subjective:    Patient ID: Jake Simpler Sr., male    DOB: [birth year], 50 y.o.   MRN: [id number]  HPI 50 yo male Never smoker with alpha one carrier (MZ w/ low level ~78) , who has mild bronchiectasis in lung bases followed by .Dr. Kendrick Fries   Synopsis :  Synopsis: [name] is an alpha-1 carrier who has mild bronchiectasis in the bases of his lungs. He was first evaluated by the [place] [facility] in 2012. He then followed up with [place] for the following 3 years. During that time he continued to have evidence of only mild airflow obstruction on pulmonary function testing. 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 He was treated with one year with fluconazole. He was followed by infectious diseases as well as pulmonary at [facility]. For some reason Roflumilast was attempted during that time and he was quite intolerant of this with a lot of back pain. During that time he was started on inhaled amikacin and he was continued on hypertonic saline. He would have periodic flareups treated with Augmentin. He transferred his care back to the [place] pulmonary in 2015 for convenience because he lives in [place]. He had Mycobacterium avium intracellular grow out of his mucus twice in 2015 2016 so he was started on therapy again in August 2016.  08/11/2016 NP  Acute OV  : Bronchiectasis /MAC  Patient presents for an acute office visit. Complains of of 1-2 weeks of increased cough, congestion , sinus pressure and pain. Now left ear if hurting. Was started on Augmentin for 5 days , last dose yesterday. No fever, orthopnea, edema or hemoptysis.  Remains on [place] neb Three times a day  For 1 month and then off 1 month On Ethambutol and biaxin.  Previously on Tobramycin neb but has been off for a while.  He does not have any insurance . Has several med programs that he gets assistance through . He was unable to get VEST due to cost issues.  Denies chest pain, orthopnea, PND,  or increased leg swelling. No hemoptysis, rash , or diarrhea. Most recent CT in 01/2015 report reviewed which did not show ILD changes, bronchiectatic changes. rec  Extend Augmentin for 5 days .take with food, take probiotic while on antibiotic.  Mucinex DM Twice daily  As needed  Cough/congestion .     09/25/2016 acute extended ov/Yarel Kilcrease re: bronchiectasis flared x 2  months  / maint rx symb 160 Chief Complaint  Patient presents with  . Acute Visit    Pt c/o increased cough and sputum production over the past 2 months- pale yellow sputum. He also c/o right side pain off and on. He states he had fever and chills approx 1 wk ago.   cough x 5 years  Worse x 2 months and esp x 2 weeks and turning darker/ thicker  Using flutter valve twice daily  Just started aztreonam around 09/21/16 and eth /biaxin mwf  Omeprazole 20 x 2 before bfast, ranitidine after supper and one bedtime   Has chronic bilateral chest pain neither with breathing, neither worse since cough worse   No obvious patterns  Om day to day or daytime variability or assoc  mucus plugs or hemoptysis or cp or chest tightness, subjective wheeze or overt sinus or hb symptoms. No unusual exp hx or h/o childhood pna/ asthma or knowledge of premature birth.  Sleeping ok on CPAP (which helps the cough) without nocturnal  or early  am exacerbation  of respiratory  c/o's or need for noct saba. Also denies any obvious fluctuation of symptoms with weather or environmental changes or other aggravating or alleviating factors except as outlined above   Current Medications, Allergies, Complete Past Medical History, Past Surgical History, Family History, and Social History were reviewed in [facility] electronic medical record.  ROS  The following are not active complaints unless bolded sore throat, dysphagia, dental problems, itching, sneezing,  nasal congestion or excess/ purulent secretions, ear ache,   fever, chills, sweats, unintended wt loss,  classically pleuritic or exertional cp,  orthopnea pnd or leg swelling, presyncope, palpitations, abdominal pain, anorexia, nausea, vomiting, diarrhea  or change in bowel or bladder habits, change in stools or urine, dysuria,hematuria,  rash, arthralgias, visual complaints, headache, numbness, weakness or ataxia or problems with walking or coordination,  change in mood/affect or memory.              Objective:   Physical Exam   amb wm quite hoarse nad  Wt Readings from Last 3 Encounters:  09/25/16 202 lb 6.4 oz (91.8 kg)  08/11/16 200 lb (90.7 kg)  07/07/16 198 lb 9.6 oz (90.1 kg)    Vital signs reviewed - Note on arrival 02 sats  95% on RA    HEENT: nl dentition, turbinates, and oropharynx. Nl external ear canals without cough reflex   NECK :  without JVD/Nodes/TM/ nl carotid upstrokes bilaterally   LUNGS: no acc muscle use,  Nl contour chest - junky insp and exp rhonchi bilaterally   CV:  RRR  no s3 or murmur or increase in P2, no edema   ABD:  soft and nontender with nl inspiratory excursion in the supine position. No bruits or organomegaly, bowel sounds nl  MS:  Nl gait/ ext warm without deformities, calf tenderness, cyanosis or clubbing No obvious joint restrictions   SKIN: warm and dry without lesions    NEURO:  alert, approp, nl sensorium with  no motor deficits              Assessment & Plan:

## 2016-09-27 DIAGNOSIS — J449 Chronic obstructive pulmonary disease, unspecified: Secondary | ICD-10-CM | POA: Insufficient documentation

## 2016-09-27 NOTE — Assessment & Plan Note (Signed)
-   The proper method of use, as well as anticipated side effects, of a metered-dose inhaler are discussed and demonstrated to the patient. Improved effectiveness after extensive coaching during this visit to a level of approximately 75 % from a baseline of 50 % > try reduce symbicort to 80 2bid since the higher doses may be interfering with immune response

## 2016-09-27 NOTE — Assessment & Plan Note (Signed)
November 2012 pulmonary function test at [facility] ratio 89%, FEV1 2.8 L (82% predicted, no change with bronchodilator) no lung volumes or DLCO performed October 2015 PFT > Ratio 87% FEV1 2.68 L (81% , 15% change with BD), TLC 5.25 L (86% pred), DLCO 22.2 (84% pred)  Respiratory culture results from [facility]: March 2014 cryptococcus July 2014 aspergillus [place] October 2014 aspergillus [place] October 2015 penicillium species October 2014 Actinomadura October 2014 Streptomyces January 2015 Actinomadura August 2015 Mycobacterium avium complex August 2015 Actinomadura October 2015 haemophilus species October 2015 "mold"  10/2014 Sputum culture > strep pneumo 10/2014 Sputum Fungal> fungus isolated, speciation pending > not crypto 10/2014 Sputum AFB> negative  01/2015 AFB culture > MAI 01/2015 Fungal culture> negative  05/2015 AFB Culture > pending 05/2015 fungal culture > candida  Immunoglobulin profile 06/2015 > IgG normal, IgA and IgM low  05/2016 Sputum> cladosporium   Acutely worse x 2 weeks/ not using flutter valve as much as he could be/ not responding so far to rx for atypical TB on 4 different abx but not toxic at this point so rec levaquin 750 x 7 days then regroup with Dr Kendrick Fries  I had an extended discussion with the patient reviewing all relevant studies completed to date and  lasting 15 to 20 minutes of a 25 minute visit  For acute flare of cough in pt new to me  Each maintenance medication was reviewed in detail including most importantly the difference between maintenance and prns and under what circumstances the prns are to be triggered using an action plan format that is not reflected in the computer generated alphabetically organized AVS.    Please see instructions for details which were reviewed in writing and the patient given a copy highlighting the part that I personally wrote and discussed at today's ov.

## 2016-10-07 ENCOUNTER — Ambulatory Visit: Payer: Self-pay | Admitting: Pulmonary Disease

## 2016-10-09 ENCOUNTER — Telehealth: Payer: Self-pay | Admitting: Pulmonary Disease

## 2016-10-09 NOTE — Telephone Encounter (Signed)
No idea? 

## 2016-10-09 NOTE — Telephone Encounter (Signed)
Called and spoke with pt and he stated that the vest that he was going to get was too expensive and they advised him that they have a mask that he could use that would help him to be able to get this congestion up.  BQ please advise if you know what mask he is talking about.  thanks

## 2016-10-12 NOTE — Telephone Encounter (Signed)
Called pt, made aware that we do not know of such mask.  Pt states that [facility] was the group who suggested a mask to him.  I advised that he follow up with [facility] regarding this.  Pt expressed understanding.  Nothing further needed.

## 2016-12-04 ENCOUNTER — Ambulatory Visit: Payer: Self-pay | Admitting: Pulmonary Disease

## 2016-12-21 ENCOUNTER — Other Ambulatory Visit: Payer: Self-pay | Admitting: Pulmonary Disease

## 2017-01-08 ENCOUNTER — Telehealth: Payer: Self-pay | Admitting: Pulmonary Disease

## 2017-01-08 NOTE — Telephone Encounter (Signed)
Spoke with pt's wife, states that they have been unable to fill clarithromycin and ethambutol d/t per pharmacy the medications being discontinued. [facility] healthcare pharmacy (where rx's are filled for pt) at [phone], states that the medications are not discontinued by the pharmacy or manufacturer, but as of 11/2016 [facility] pharmacy will not fill providers that are not in [facility] healthcare system.  Pt was last seen by [facility] internal medicine on 01/2016 per pharmacist. Pt fills medication through this pharmacy d/t their medication assistance program.  BQ please advise on how to proceed.  Thanks!

## 2017-01-11 NOTE — Telephone Encounter (Signed)
Spoke with pt's wife, Lupita Leash. She states that pt is still seeing Dr. Darnelle Spangle with [facility]. Her phone number is [phone].

## 2017-01-11 NOTE — Telephone Encounter (Signed)
Tough situation.  I suppose the best approach is for me to talk to his PCP.  Can they get me that name and number?  Then when I know who it is we can chat and I can see if they are willing to prescribe the medicine.

## 2017-01-13 NOTE — Telephone Encounter (Signed)
Pt wife calling back a/b husband's meds.[name]

## 2017-01-13 NOTE — Telephone Encounter (Signed)
Spoke with pt's wife. She is questioning if the issue has been resolved yet. Advised her the chart has not been updated yet. Pt aware once BQ has speaks with pt's PCP then we will let them know. Pt's wife verbalized understanding.   Dr. Kendrick Fries please advise if you have called PCP yet. Thanks.

## 2017-01-14 ENCOUNTER — Encounter: Payer: Self-pay | Admitting: Pharmacist

## 2017-01-14 NOTE — Telephone Encounter (Signed)
Spoke with pt's spouse and notified that BQ left Dr Rosita Fire a msg and is waiting for her reply  She verbalized understanding and nothing further needed

## 2017-01-14 NOTE — Telephone Encounter (Signed)
Please let her know I left a detailed message with Dr. Rosita Fire yesterday, am awaiting a reply

## 2017-01-14 NOTE — Telephone Encounter (Signed)
lmomtcb x1 

## 2017-01-19 ENCOUNTER — Ambulatory Visit (INDEPENDENT_AMBULATORY_CARE_PROVIDER_SITE_OTHER): Payer: Medicaid Other | Admitting: Psychiatry

## 2017-01-19 ENCOUNTER — Telehealth: Payer: Self-pay | Admitting: Pulmonary Disease

## 2017-01-19 DIAGNOSIS — Z029 Encounter for administrative examinations, unspecified: Secondary | ICD-10-CM

## 2017-01-19 NOTE — Telephone Encounter (Signed)
Dr. Kendrick Fries  Please Advise-  Pt's wife called wanting to know if you spoke with Dr. Terressa Koyanagi about this pt's medication. She is concerned that the pt has a follow up with you on April 18 and he is out of his medication.

## 2017-01-20 NOTE — Telephone Encounter (Signed)
Patient's wife stated husband isn't feeling good and she is waiting on a response concerning his medication.

## 2017-01-20 NOTE — Telephone Encounter (Signed)
Called spoke with patient's spouse Lupita Leash - apologized to Lupita Leash > BQ was scheduled off yesterday and is night float tonight.  Lupita Leash okay with this.  Lupita Leash would like to know if BQ has heard from Dr Rosita Fire yet?  Per pt's chart, BQ left message for Dr Rosita Fire on 3.15.18.  Lupita Leash also reported that pt has been out of his MAI meds x1 week now (apparently they were informed that [facility] needs to fill these medications) and pt began experiencing fever/chills, hoarseness, wheezing, tightness in chest, cough that will occasionally produce yellow mucus that is hard for him to get up x3-4 days.  Lupita Leash is requesting an abx for these symptoms.  Offered to send phone note to provider here in the office today but she stated their pharmacy is closed and this can wait until tomorrow.  Routing to BQ marked urgent and asking Morrie Sheldon to message him to ask that he view this message tonight. Pharmacy is [facility] in [place].  BQ please advise, thank you.

## 2017-01-20 NOTE — Telephone Encounter (Signed)
Yes, Dr. Rosita Fire and I spoke on the phone in the last few days, she is willing to fill his medicines.  Please call in levaquin 750mg  po daily x 7 days for him to take now.   After he completes the levaquin, he should resume his MAI therapy: this can be prescribed by Dr. Rosita Fire (she told me she was willing to do this).  Please provide them with a written Rx for his Clarithromycin and Ethambutol.  He only needs 2.5 months worth as I was planning to stop it in July 2018.

## 2017-01-21 MED ORDER — LEVOFLOXACIN 750 MG PO TABS: 750.0000 mg | ORAL_TABLET | Freq: Every day | ORAL | 0 refills | Status: AC

## 2017-01-21 NOTE — Telephone Encounter (Signed)
Spoke with pt's wife, aware of recs.  levaquin sent to pharmacy.  This medication must be prescribed by Dr. Rosita Fire for pt to get this filled through [facility] pharmacy where his rx plan is honored (see previous phone note).  Pt's wife will call Dr. Tarri Fuller office to have this rx filled, and will call us back if there are any issues with getting this filled.  Will close encounter.

## 2017-02-12 ENCOUNTER — Telehealth: Payer: Self-pay | Admitting: Pharmacist

## 2017-02-12 NOTE — Telephone Encounter (Signed)
02/12/17 Called [employer] spoke with Orlene Plum, she processed for refill stated will not release till 03/07/17, this is patient's last refill; enrollment ends 03/09/17.

## 2017-02-17 ENCOUNTER — Ambulatory Visit: Payer: Self-pay | Admitting: Pulmonary Disease

## 2017-03-08 ENCOUNTER — Other Ambulatory Visit: Payer: Self-pay | Admitting: Pulmonary Disease

## 2017-04-12 ENCOUNTER — Telehealth: Payer: Self-pay | Admitting: Pharmacist

## 2017-04-12 NOTE — Telephone Encounter (Signed)
04/12/17 Mailing Denial letter to patient, he has failed to return the Re Certification packet mailed to him Feb. 8, 2018.

## 2017-05-12 MED FILL — DULERA/200-5MCG/AERO: DULERA/200-5MCG/AERO | 90 days supply | Qty: 3 | Fill #1

## 2017-05-25 MED ORDER — SULFAMETHOXAZOLE 800 MG-TRIMETHOPRIM 160 MG TABLET
ORAL_TABLET | Freq: Three times a day (TID) | ORAL | 0 refills | 0.00000 days | Status: CP
Start: 2017-05-25 — End: 2017-06-08

## 2017-05-26 MED FILL — SULFAMETHOXAZOLE/TRIMETHO/800-160/TABS: SULFAMETHOXAZOLE/TRIMETHO/800-160/TABS | 10 days supply | Qty: 60 | Fill #0

## 2017-06-03 MED ORDER — METFORMIN ER 500 MG TABLET,EXTENDED RELEASE 24 HR: tablet | 3 refills | 0 days

## 2017-06-03 MED ORDER — METFORMIN ER 500 MG TABLET,EXTENDED RELEASE 24 HR
ORAL_TABLET | Freq: Every day | ORAL | 3 refills | 0.00000 days | Status: CP
Start: 2017-06-03 — End: 2017-06-03

## 2017-06-04 MED FILL — METFORMIN HCL/ER 500MG/TAB: METFORMIN HCL/ER 500MG/TAB | 90 days supply | Qty: 180 | Fill #0

## 2017-06-22 MED ORDER — SODIUM CHLORIDE 7 % FOR NEBULIZATION: mL | 0 refills | 0 days | Status: AC

## 2017-06-22 MED ORDER — SODIUM CHLORIDE 7 % FOR NEBULIZATION
99 refills | 0.00000 days | Status: CP
Start: 2017-06-22 — End: 2017-06-22

## 2017-06-22 MED ORDER — SODIUM CHLORIDE 7 % FOR NEBULIZATION: mL | 99 refills | 0 days

## 2017-06-24 MED FILL — SODIUM CHLORIDE/7%/NEBU: SODIUM CHLORIDE/7%/NEBU | 100 days supply | Qty: 600 | Fill #0

## 2017-06-24 NOTE — Unmapped (Signed)
[facility] Refill Coordination Note: Pulmonary         Brett Hanna Sr., DOB: Apr 03, 1966    [name] Active? No-not enrolled    Medication Adherence    Specialty Medication:  COLISTIN + KIT QOH 14 DAYS   Informant:  patient  Support network for adherence:  family member  Confirmed plan for next specialty medication refill:  delivery by pharmacy  Medication Assistance Program  Medication Program Assistance Provided (If Applicable):  PAP   Refill Coordination  Has the Patient's Contact Information Changed:  No  Is the Shipping Address Different:  No  Shipping Information  Delivery Scheduled:  Yes  Delivery Date:  07/01/17  Medications to be Shipped:      Specialty Medication(s) and additional medications shipped:   -Inhaled colistin 150mg  and kit: sodium chloride 0.9%, syringes with needles, Pari nebulizer cup, alcohol swabs  -Montelukast 10mg   -Omeprazole 20mg                  -Confirmed the medication and dosage are correct and have not changed: Yes, regimen is correct and unchanged.  -Confirmed patient started or stopped the following medications in the past month:  No, there are no changes reported at this time.           ADDITIONAL NOTES          OMEPRAZOLE SCH'D FOR SEPT 11 DEL            FINANCIAL/SHIPPING     Delivery Scheduled: Yes, Expected medication delivery date: 8/31             Jolene Schimke  Specialty Pharmacy Technician

## 2017-06-29 ENCOUNTER — Ambulatory Visit
Admission: RE | Admit: 2017-06-29 | Discharge: 2017-06-29 | Disposition: A | Discharge status: Home / Self Care | Payer: MEDICARE

## 2017-06-29 DIAGNOSIS — R1011 Right upper quadrant pain: Principal | ICD-10-CM

## 2017-06-29 DIAGNOSIS — R768 Other specified abnormal immunological findings in serum: Secondary | ICD-10-CM

## 2017-06-29 DIAGNOSIS — J471 Bronchiectasis with (acute) exacerbation: Principal | ICD-10-CM

## 2017-06-29 MED ORDER — PREDNISONE 20 MG TABLET: tablet | 0 refills | 0 days

## 2017-06-29 MED ORDER — MINOCYCLINE 100 MG CAPSULE: 100 mg | capsule | 0 refills | 0 days

## 2017-06-29 MED ORDER — MINOCYCLINE 100 MG CAPSULE
ORAL_CAPSULE | Freq: Two times a day (BID) | ORAL | 0 refills | 0.00000 days | Status: CP
Start: 2017-06-29 — End: 2018-06-10

## 2017-06-29 MED ORDER — NEBULIZER AND COMPRESSOR: each | 0 refills | 0 days | Status: AC

## 2017-06-29 MED ORDER — PREDNISONE 20 MG TABLET
ORAL_TABLET | Freq: Every day | ORAL | 0 refills | 0.00000 days | Status: CP
Start: 2017-06-29 — End: 2018-07-01

## 2017-06-29 MED ORDER — NEBULIZER AND COMPRESSOR
0 refills | 0 days | Status: CP
Start: 2017-06-29 — End: 2017-06-29

## 2017-06-29 NOTE — Unmapped (Signed)
[facility] [place]  [facility]    Assessment:      Patient:Brett D Baucum Sr. (19-Aug-1966)  Reason for visit: Brett Hanna is a 51 y.o.male who returns for follow-up of bronchiectasis secondary to unknown cause (idiopathic) with alpha-1 antitrypsin carrier state and OSA. Reporting worsening symptoms despite treatment with Bactrim targeting Stenotrophomonas.     Plan:      Problem List Items Addressed This Visit        Respiratory    Bronchiectasis (CMS-HCC)     Bronchiectasis Severity Index Score:    Age 53-69 = 2   BMI 18.5 or higher = 0   FEV1 % predicted 50-80% = 1   Hospitalized for severe exacerbation in past 2 years No = 0   Exacerbations in previous year 3 or more = 2   mMRC dyspnea score 1 = I get short of breath when hurrying on level ground or walking up a slight hill.   Chronic Pseudomonas colonization (at least 2 positive cultures at least 3 months apart within 1 year) No = 0   Colonization with other potential pathogenic bacteria (at least 2 positive cultures at least 3 months apart within 1 year) No = 0   Radiologic Severity 3 or more lobes or cystic brx = 1       Total Score 7     BSI score of 5-8 = Moderate bronchiectasis (1 year outcomes 0.8 - 4.8% mortality, 1.0 - 7.2% hospitalization rate; 4 year outcomes 4 - 11.3% mortality, 9.9 - 19.4% hospitalization)    Clinton Quant JD et al. The Bronchiectasis Severity Index: An International Derivation and Validation Study. AJRCCM, 2013; 189(5): 161-096.)           Relevant Medications    minocycline (MINOCIN,DYNACIN) 100 MG capsule    predniSONE (DELTASONE) 20 MG tablet    Other Relevant Orders    AFB SMEAR    CBC w/ Differential      Other Visit Diagnoses     Abnormal ANCA test    -  Primary    Relevant Orders    Urinalysis: Clean Catch    CBC w/ Differential        Reinforced importance of regular airway clearance.  In bronchiectasis, there is impaired mucus clearance from both the airway changes and from the underlying cause of the bronchiectasis.  This impaired clearance leads to accumulation of mucus, resulting in inflammation, accumulation of bacteria, and worsening damage to the airways.  Therefore, augmentation of airway clearance is critical. Brett Hanna was provided websites with information about bronchiectasis, social forums for patients with bronchiectasis and NTM, and airway clearance techniques    Patient met with the respiratory therapist today to review technique with oscillating PEP device, discuss obtaining new nebulizer, discuss cleaning of oscillating PEP device and discuss cleaning of nebulizer equipment. Nebulizer/nebulizer equipment order was sent to DME company. Patient was provided written instructions on proper order of treatments.  Patient should bring any new airway clearance devices to the next visit to review technique with the respiratory therapist. He should NOT nebulize antibiotics through the [place] as it will clog the mechanism.    Brett Hanna has bronchiectasis as seen on chest CT done on 03/30/17. Bronchiectasis has resulted in a chronic cough that has been present for over 6 months as well as 4 exacerbations requiring antibiotic treatment over the past 12 months.  He is on chronic inhaled antibiotic therapy with alternating months of Colistin and  Cayston  For airway clearance, he has tried and failed: Acapella/Flutter/PEP, Breathing Techniques/Huff Cough and Nebulized Hypertonic Saline.  These methods failed, are contraindicated, or inappropriate for the following reasons: Did Not Mobilize Secretions and No Skilled Caregiver.  Therefore, it is medically necessary for Brett Hanna to use a percussive Vest for airway clearance. I have ordered it from [facility].    Patient spontaneously expectorated sputum that was sent for bacterial and AFB cultures. These results will help to guide antibiotic therapy in the future during exacerbations.  If he has future exacerbations, sputum should be sent for bacterial and AFB cultures. The bacterial culture should be processed like a cystic fibrosis sample given the overlapping pathogens. There are standing orders for these cultures in our system.    Based on his failure to improve with Bactrim, I started Brett Hanna on minocycline for 14 days to target his Stenotrophomonas.  I also ordered prednisone 40 mg daily for 5 days. He plans to pick it up from the [facility] on Friday when he sees Dr. Rosita Fire. He was instructed to call me on September 11 to let me know how he is feeling.  If he is not better and his sputum culture from today is not revealing, I think he needs to be admitted for bronchoscopy and IV therapy.    I spoke to his PCP, Dr. Rosita Fire, after his visit today. We discussed his clinical presentation, his failure to improve significantly with any intervention in the past, and his recent lab results including indeterminate ANCA and lymphopenia. There is increasing awareness of the association between ANCA vasculitis and A1AT-deficiency. Reasonable to repeat CBC-D and obtain UA when he sees Dr. Rosita Fire on Friday.  I have placed these orders in the system.    Brett Hanna should receive the standard dose quadrivalent influenza vaccine in the fall.  He is up to date on his pneumococcal vaccines.    Brett Hanna will return to clinic in 3 months for follow-up with pre-bronchodilator spirometry and sputum cultures.  He will call or send me a message via MyChart if questions or concerns arise before this visit.      Subjective:      HPI: Brett Hanna is a 51 y.o. male who returns for follow-up of bronchiectasis secondary to unknown cause (idiopathic) with alpha-1 AT carrier state, OSA.       Since the last clinic visit on 03/25/17:  ?? he has been treated for an exacerbation 1 times.  his last exacerbation was June 2018.  I had ordered Bactrim 2 tablets to be taken every 8 hours for 14 days. However, the pharmacy wrote ITW on the sig. Patient didn't know what this meant so instead took 2 tablets daily for 2 weeks. He didn't feel better and error was discovered after wife called pharmacist. Then prescribed additional two week course of Bactrim with correct directions, which patient completed. He started to feel a bit better on the antibiotics but reached plateau. Unclear if improvement was during first or second course of abx.  ?? he has not been admitted for an exacerbation.  ?? he has not experienced hemoptysis.     ?? Cough is productive of thick green sputum. This is unchanged.  Cough is exhausting and makes his chest and back sore, headache.   ?? Wheezing is present.  Chest tightness is present. Using rescue albuterol inhaler 2-3 times per day at least; not using with spacer if not home.  In addition to the three times a day nebulized (twice a  day before HTS).   Tightness over left anterior chest - comes and goes.  Using [facility] with spacer but using prior to albuterol and clearance.  ?? Pleurisy is absent.  ?? Alternates inhaled colistin and cayston monthly. Currently on Colistin.    For airway clearance, Brett Hanna is using Hypertonic Saline 7% 2 times per day.  Using [place] while nebulizing albuterol, hypertonic saline, and inhaled antibiotics.  Frustrated that it takes him 90 minutes to complete all these therapies. He is also exhausted from using the [place] the whole time.  Tells me his nebulizer is a Respimed nebulizer from [facility].  Exercise capacity is rated as good. MMRC of 1 = I get short of breath when hurrying on level ground or walking up a slight hill.  Brett Hanna is not on home oxygen therapy.    Review of Systems  No chills, fever, night sweats. Remainder of a complete review of systems was negative unless mentioned above.    Past Medical History:   Diagnosis Date   ??? Allergic rhinitis    ??? Alpha-1-antitrypsin deficiency carrier (CMS-HCC)     with slightly low levels   ??? Asthma    ??? Asthma, extrinsic    ??? Bronchiectasis (CMS-HCC) 06/12/2011   ??? Chronic sinusitis    ??? Colon polyps    ??? Diabetes mellitus (CMS-HCC)    ??? GERD (gastroesophageal reflux disease)    ??? Impaired glucose tolerance    ??? Obesity    ??? Pneumonia    ??? Pseudomonas respiratory infection    ??? 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 Sleep apnea    ??? Strep throat        Current Outpatient Prescriptions   Medication Sig Dispense Refill   ??? acetaminophen (TYLENOL EXTRA STRENGTH) 500 MG tablet Take 500 mg by mouth. Frequency:PHARMDIR   Dosage:500   MG  Instructions:  Note:Take 2 pills 2 times per day as needed Dose: 500MG      ??? albuterol (PROVENTIL HFA;VENTOLIN HFA) 90 mcg/actuation inhaler Inhale 2 puffs every four (4) hours as needed for wheezing. 1 Inhaler 11   ??? albuterol 2.5 mg /3 mL (0.083 %) nebulizer solution Inhale 3 mL by nebulization every six (6) hours as needed for wheezing or shortness of breath. 270 vial 6   ??? aztreonam lysine (CAYSTON) 75 mg/mL Nebu nebulization solution Inhale 75 mg.     ??? cetirizine (ZYRTEC) 10 MG tablet Take 10 mg by mouth daily.     ??? cholecalciferol, vitamin D3, (VITAMIN D3) 2,000 unit cap Take 1 capsule (2,000 Units total) by mouth daily. 30 capsule prn   ??? colistimethate (COLYMYCIN) 150 mg injection Inhale 2 mL (150 mg total) Two (2) times a day. Inject 2mL SWFI into colistin vial, then withdraw 2mL (150mg ) for dose. 60 vial 5   ??? cyclobenzaprine (FLEXERIL) 5 MG tablet Take 1 tablet (5 mg total) by mouth Three (3) times a day. 20 tablet 0   ??? fexofenadine (ALLEGRA) 180 MG tablet Take 180 mg by mouth daily.     ??? fluticasone (FLONASE) 50 mcg/actuation nasal spray 2 sprays by Each Nare route daily. 16 g prn   ??? metFORMIN (GLUCOPHAGE-XR) 500 MG 24 hr tablet Take 2 tablets (1,000 mg total) by mouth every morning before breakfast. 180 tablet 3   ??? mometasone-formoterol (DULERA) 200-5 mcg/actuation HFAA Inhale 2 puffs Two (2) times a day. 1 Inhaler 11   ??? montelukast (SINGULAIR) 10 mg tablet Take 1 tablet (10 mg total) by mouth  nightly. 90 tablet 3   ??? nystatin (MYCOSTATIN) 100,000 unit/mL suspension Take 5 mL by mouth.     ??? omeprazole (PRILOSEC) 20 MG capsule Take 1 capsule (20 mg total) by mouth Two (2) times a day. (Patient taking differently: 2 tablets by mouth every morning) 180 capsule 3   ??? rizatriptan (MAXALT) 10 MG tablet Take one tablet at onset of migraine.  May repeat in  2 hours. 10 tablet 12   ??? sertraline (ZOLOFT) 50 MG tablet Take 1 tablet (50 mg total) by mouth daily. 90 tablet 3   ??? sodium chloride 7% 7 % Nebu INHALE 3 ML VIA NEBUZLIER TWICE DAILY 540 mL PRN   ??? topiramate (TOPAMAX) 100 MG tablet TAKE ONE TABLET BY MOUTH AT BEDTIME 90 tablet 3   ??? alcohol swabs PadM Use as directed with inhaled antibiotics 100 each 5   ??? MISCELLANEOUS MEDICAL SUPPLY MISC Frequency:PHARMDIR   Dosage:0.0     Instructions:  Note:CPAP 15 cm H20 with heated humidity for nasal dryness.  Mask: ResMed Quattro size Medium. Dx OSA. Dose: 1     ??? PARI LC D NEBULIZER Misc Provide 1 device  for use with inhaled medication. 1 each 5   ??? sodium chloride (NS) Soln Use 2mL NaCl 0.9% to mix colistin vial, Add extra 1mL NaCl into neb cup along with mixed colistin,total volume 3mL. 600 mL 5   ??? sterile water, PF, Soln Add 1mL SWFI into neb cup along with colistin. 60 mL 5   ??? syringe with needle (SYRINGE 3CC/20GX1) 3 mL 20 gauge x 1 Syrg For use with inhaled antibiotic 60 Syringe 5     No current facility-administered medications for this visit.        Allergies  Reviewed on: 03/30/2017      Reactions Comment    Codeine Shortness Of Breath, Rash     Rifampin  Fevers, chills.    Daliresp [roflumilast] Other (See Comments) Back pain          Social History     Social History   ??? Marital status: Married     Spouse name: Lupita Leash   ??? Number of children: 3   ??? Years of education: N/A     Occupational History   ???  Disabled     Social History Main Topics   ??? Smoking status: Never Smoker   ??? Smokeless tobacco: Former Neurosurgeon   ??? Alcohol use No   ??? Drug use: No   ??? Sexual activity: Not Asked     Other Topics Concern   ??? None     Social History Narrative    Hasn't yet required services for disability. 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         Objective:   Objective   BP 129/63 (BP Site: L Arm, BP Position: Sitting, BP Cuff Size: Large)  - Pulse 76  - Temp 36.6 ??C (97.8 ??F) (Oral)  - Resp 14  - Ht 167 cm (5' 5.75)  - Wt 91.2 kg (201 lb 1.6 oz)  - SpO2 94%  - BMI 32.71 kg/m??   General Appearance:   Overweight Caucasian male appearing comfortable, non-toxic, in no distress, and stated age.   Eyes:  PERRL, conjunctiva clear, EOM's intact. No drainage.   Nose: Nares normal, septum midline. Normal mucosa.  No drainage or polyps.  No sinus tenderness.   Oropharynx: Moist mucus membranes without lesions or thrush.  Good dentition.  Posterior  pharynx clear.   Respiratory:   Coarse breath sounds throughout with high pitch wheeze with forceful coughing. Easy work of breathing without accessory muscle use.     Cardiovascular:  Regular rate and rhythm. S1 and S2 normal. No murmur, rub  or gallop.  Pulses intact and symmetric.  No peripheral cyanosis or edema.   Gastrointestinal:   Abdomen protuberant.    Musculoskeletal: No tenderness or deformity of the chest wall.  Joints normal.   No clubbing.   Skin: No rashes, lesions, skin changes.   Heme/Lymph: No cervical, supraclavicular, submandibular, or suprasternal adenopathy.  No bruising or petechiae.   Neurologic: Alert and oriented x3. No focal neurological deficits.  Normal gait.      Diagnostic Review:   Pulmonary Functions Testing Results:    Measures do not meet ATS criteria for interpretation.     Chest CT (03/30/17): Images personally reviewed. Interval worsening of bilateral lower lobe bronchiectasis and bronchial wall thickening with new lingular bronchiectasis. Increased clustered groundglass opacities with areas of consolidation in the lower lobes and lingula. Findings may represent sequela of endobronchial infection or impaired mucus clearance.  Bronchiectasis has been present on CT imaging since 06/12/11.    Cultures  Lab Results   Component Value Date    LABLORES 3+ Oropharyngeal Flora Isolated 03/25/2017    LABLORES 3+ Stenotrophomonas maltophilia (A) 03/25/2017    ORG Haemophilus influenzae (A) 06/26/2014    ORG Mould (A) 06/26/2014    ORG Mycobacterium avium complex (A) 06/26/2014    ORG Actinomadura species (A) 06/26/2014    AFBCX No Acid Fast Bacilli Detected 03/25/2017     Labs:  Hepatic Function Panel    Collection Time: 03/30/17 11:41 AM   Result Value Ref Range    Albumin 4.7 3.5 - 5.0 g/dL    Total Protein 7.5 6.6 - 8.0 g/dL    Total Bilirubin 0.6 0.0 - 1.2 mg/dL    Bilirubin, Direct 1.61 0.00 - 0.40 mg/dL    AST 20 19 - 55 U/L    ALT 36 19 - 72 U/L    Alkaline Phosphatase 74 38 - 126 U/L   Anti-neutrophilic Cytoplasmic Antibody (ANCA)    Collection Time: 03/30/17 11:41 AM   Result Value Ref Range    ANCA Screen      IFA Positive, Perinuclear Pattern (A) Negative    MPO-Elisa Negative Negative    MPO-Quant 2.2 <21.0 U/mL    PR3 Elisa Negative Negative    PR3-Quant 6.4 <21.0 U/mL   IgE Total    Collection Time: 03/30/17 11:41 AM   Result Value Ref Range    IgE, Total 20.9 3 - 48 kU/L   CBC w/ Differential    Collection Time: 03/30/17 11:41 AM   Result Value Ref Range    WBC 5.3 4.5 - 11.0 10*9/L    RBC 5.25 4.50 - 5.90 10*12/L    HGB 15.0 13.5 - 17.5 g/dL    HCT 09.6 04.5 - 40.9 %    MCV 84.4 80.0 - 100.0 fL    MCH 28.7 26.0 - 34.0 pg    MCHC 34.0 31.0 - 37.0 g/dL    RDW 81.1 91.4 - 78.2 %    MPV 7.5 7.0 - 10.0 fL    Platelet 238 150 - 440 10*9/L    Absolute Neutrophils 3.9 2.0 - 7.5 10*9/L    Absolute Lymphocytes 0.6 (L) 1.5 - 5.0 10*9/L    Absolute Monocytes 0.4 0.2 - 0.8 10*9/L  Absolute Eosinophils 0.2 0.0 - 0.4 10*9/L    Absolute Basophils 0.0 0.0 - 0.1 10*9/L    Large Unstained Cells 2 0 - 4 %     Health Care Maintenance:  Most Recent Immunizations   Administered Date(s) Administered   ??? Hep A / Hep B 08/26/2010   ??? Hepatitis B, Adult 09/16/2010   ??? INFLUENZA TIV (TRI) PF (IM) 11/17/2011   ??? Influenza Vaccine Quad (IIV4 PF) 7mo+ injectable 09/22/2016   ??? Influenza Vaccine Quad (IIV4 W/PRESERV) 3MO+ 09/26/2013   ??? Influenza Virus Vaccine, unspecified formulation 08/16/2015   ??? Pneumococcal Conjugate 13-Valent 11/24/2013   ??? Pneumococcal Polysaccharide 23 07/10/2010   ??? TdaP 12/22/2008

## 2017-06-29 NOTE — Unmapped (Signed)
[facility] Pharmacy Note     Primary pulmonologist: Rosetta Posner Sr. is a 51 y.o. male with bronchiectasis who presents to the pulmonary clinic for routine clinic follow up.     Genetic Profile: A1A carrier (MZ w/ low level ~78)    DISCREPANCIES NOTED DURING MEDICATION REVIEW:   Taking Differently:  -Omeprazole 20 mg: 2 capsules every morning  -Cyclobenzaprine 5 mg: taking as needed  -Nystatin 100,000/mL suspension: Taking as needed for thrush (counseled patient on remembering to rinse out mouth after using Dulera)    Patient Concerns:  - Colistimethate: concerned that he is going to run out of syringes for the nebulization preparation. He is not out but does not think he was given enough.  -Ranitidine: patient used to take 2 omeprazole 20 mg every morning and 2 ranitidine 150 mg every night to control his reflux. A pharmacist told him he didn???t need this much medication and discontinued his ranitidine. He reports an increase in throat clearing since stopping the ranitidine 3 months ago.  Elwin Sleight does not work as well as Symbicort, but was told he could not get Symbicort at [facility]  -Patient wants to know if he can get fexofenadine 180 mg at the [facility]  -New Medicare: Concerned that Medicare will not cover some of his medications or that [facility] will not accept his new plan  -Patient uses albuterol nebulizer 3 times daily and albuterol HFA 2-3 times daily. He reports it works for a short period of time but is not as effective as it used to be.    Sputum culture history:      03/25/17: 3+ Stenotrophomonas maltophilia, 3+ OPF      05/08/16:  OPF, (AFB- no growth)     02/20/16: Mycobacterium avium complex    Exacerbation history:  Last PO antibiotics:   7/24-06/08/17: Bactrim DS 2 tabs TID  6/7-6/21/18: Bactrim DS 2 tabs TID  3/23-6/16/18: clarithromycin 250 mg 4 tabs three times weekly, ethambutol 400 mg 5 tabs three times weekly    INHALED MEDICATIONS     Airway clearance medications:  -Albuterol 90 mcg/actuation HFA: Inhale 2 puffs every 6 hours as needed for wheezing   -Albuterol 2.5 mg/3 mL neb solution: Inhale 3 mL via nebulizer every 6 hours as needed for wheezing or shortness of breath  -Hypertonic saline 7%: Inhale 3 mL via nebulizer twice daily    Inhaled Antibiotics:  -Colistimethate 150mg /74mL nebulized solution: Mix 1 vial with 2 mL of normal saline in neb cup then inhale via nebulizer twice daily (currently on cycle until 07/03/17)  -Cayston (inhaled aztreonam) 75mg /mL: Inhale 1 vial (1 mL) via nebulizer 3 times a day, 28 days on and 28 days off for 3 more weeks for cycle (currently off cycle)    Other Inhaled Medications to continue:  -Dulera 200-5 mcg/act HFA: Inhale 2 puffs twice daily    CHRONIC MEDICATIONS     Chronic antibiotic/suppresive medications: N/A    Vitamins:  -Vitamin D3 2000 units: 1 capsule by mouth once daily      GI/Liver Medications:  -Omeprazole 20 mg: 1 capsule by mouth twice daily    Endocrine:  -Metformin ER 500 mg: 2 capsules by mouth every morning before breakfast    Sinus-related/Allergy Medications:   -Montelukast 10 mg: 1 tablet by mouth once nightly  -Fexofenadine 180 mg: 1 tablet by mouth once daily  -Fluticasone 50 mcg nasal spray: 2 sprays in each nostril once daily  Mood/Anxiety/Psychotropics:  -Sertraline 50 mg: 1 tablet by mouth once daily    Other maintenance medications:  -Topiramate 100 mg: 1 tablet by mouth once daily  -Rizatriptan 10 mg: 1 tablet by mouth as needed for migraine. May repeat in 2 hours if needed  -Acetaminophen 325 mg: 2 tablets by mouth every 6 hours as needed   -Cyclobenzaprine 5 mg: 1 tablet by mouth three times daily as needed  -Nystatin 100,000 unit/mL suspension: Take 5 mL by mouth once daily as needed for thrush    Assessment/Recommendations   Reviewed the indication, dose, and frequency of each medication with patient. All medications, allergies, and preferred pharmacy list were updated in EPIC medication profile. Recommendations and medication-related problems were discussed directly with provider.    Medication Management:  Adherence: Patient appears to have partial compliance with minor variances to doses prescribed. Recommended patient allow wife to fill a pillbox to help him remember to take all medications. Both agreed to this plan.   Access: Patient expressed concern that new Medicare plan would not be affordable or would not allow them to fill at the [facility]  Prescription Renewals:  Requested fexofenadine 180 mg      Pertinent drug Interactions noted: No  Recommended labs to be monitored: No     [facility] Specialty Medications   [place] [facility]  -Inhaled Colistin (currently on cycle)  -Inhaled Cayston (currently off cycle, expecting refill soon)    Enrolled in Copay Assistance Program(s):   -[place] PAP through 03/22/18   [facility] through 01/05/18    Time spent with patient: 15 minutes    Sherre Lain, PharmD Candidate    Clinical Pharmacist Attestation     I reviewed the PharmD Candidate's note and agree with documentation.    Anell Barr, PharmD, BCPS, CPP  Clinical Pharmacist Practitioner  [facility] / [facility]  Pager: [phone]

## 2017-06-29 NOTE — Unmapped (Signed)
Specimen collection:  Specimen labeled in front of patient.  Sputum specimen collected and sent to lab with MD orders/requisition.

## 2017-06-29 NOTE — Unmapped (Addendum)
Ordering you a Vest.    Don't use the [place] with the inhaled antibiotics.    Minocycline and prednisone sent to [facility].  Let me know how you feel after you finish the two week course.    I will ask Alton Revere (pharmacist) about what we can do to make sure you get Cayston and Colistin with new insurance.    Order of Treatments  1. Bronchodilator (Albuterol): Relaxes the large smooth muscle and opens up the lungs. If using neb, can be done with airway clearance.  2. Hypertonic Saline: Hydrates the lungs and helps to thin the mucus to make it easier to cough up. Can be done with airway clearance.  3. Airway Clearance (Aerobika/Vest): Helps to mobilize the mucus to make it easier to cough up.  4. Inhaled Antibiotics (Cayston or Colistin): Helps to clear infections; to be done after lungs are open and cleaned out.  5. Inhaled Corticosteroids [facility]): Anti-inflammatory; maintenance medication; take daily even if you feel fine. To be done after lungs are open and cleaned out.    Symptoms of a bronchiectasis exacerbation: 48 hours of at least three of the following:  ?? Increased cough  ?? Change in volume or appearance of sputum  ?? Increased sputum purulence  ?? Worsening shortness of breath and/or exercise tolerance  ?? Fatigue and/or malaise  ?? Coughing up blood (hemoptysis)    If you are experiencing some of these symptoms:  ?? Increase the frequency and/or intensity of your airway clearance  ?? Try to submit a sputum sample for bacterial and AFB cultures (at [facility] or locally)  ?? Reach out to me or your local physician.  ?? If you need antibiotics, recommend treating for 14 days.      Please bring any new airway clearance equipment to your next visit to ensure that you are using and caring for it properly.    I recommend receiving the standard dose quadrivalent influenza vaccine in the fall.     Thank you for allowing me to be a part of your care. Please call the clinic with any questions.    Viona Gilmore, MD, MPH  Pulmonary and Critical Care Medicine  [address]  [id number]  [place], [place] [address]    Thank you for your visit to the [facility] Pulmonary Clinics. You may receive a survey from [facility] regarding your visit today, and we are eager to use this feedback to improve your experience. Thank you for taking the time to fill it out.    Between appointments, you can reach [place] at these numbers:    For appointments or the Pulmonary Nurse: [phone]  Fax: [phone]    For urgent issues after hours:  Hospital Operator: [phone], ask for Pulmonary Fellow on call    For further information, check out the websites below:    [facility] for Bronchiectasis: [email or web address]    World Bronchiectasis Conference in [place] (including patient session) July 14: [email or web address]    Bronchiectasis Toolbox (information about airway clearance): DiningCalendar.de    Copy for Individuals with Bronchiectasis and/or NTM through the Bronchiectasis Registry and the COPD Foundation: https://www.mckee-young.org/    Information about Non-tuberculous Mycobacterial Infections:  https://www.ntminfo.org     Interested in clinical trials and other research opportunities?  www.clinicaltrials.gov  The Rare Diseases Clinical Research Network [facility]) Genetic Disorders of  Mucociliary Clearance Consortium [facility]) Contact Registry is a way for patients with disorders of mucociliary clearance (such as bronchiectasis) and their  family members to learn about research studies they may be able to join. Participation is completely voluntary and you may choose to withdraw at any time. There is no cost to join the [employer]. For more information or to join the registry please go to the following website:  [email or web address]

## 2017-06-30 NOTE — Unmapped (Signed)
Bronchiectasis Severity Index Score:    Age 52-69 = 2   BMI 18.5 or higher = 0   FEV1 % predicted 50-80% = 1   Hospitalized for severe exacerbation in past 2 years No = 0   Exacerbations in previous year 3 or more = 2   mMRC dyspnea score 1 = I get short of breath when hurrying on level ground or walking up a slight hill.   Chronic Pseudomonas colonization (at least 2 positive cultures at least 3 months apart within 1 year) No = 0   Colonization with other potential pathogenic bacteria (at least 2 positive cultures at least 3 months apart within 1 year) No = 0   Radiologic Severity 3 or more lobes or cystic brx = 1       Total Score 7     BSI score of 5-8 = Moderate bronchiectasis (1 year outcomes 0.8 - 4.8% mortality, 1.0 - 7.2% hospitalization rate; 4 year outcomes 4 - 11.3% mortality, 9.9 - 19.4% hospitalization)    Clinton Quant JD et al. The Bronchiectasis Severity Index: An International Derivation and Validation Study. AJRCCM, 2013; 189(5): C339114.)

## 2017-06-30 NOTE — Unmapped (Signed)
Brett Hanna    Current Respiratory Treatments:  Albuterol INH 2.5mg -BID and Q6 PRN  Albuterol HFA 90 mcg/ 2puffs-Q4 PRN  Hypertonic Saline INH 7%-BID  Colistin INH 150 mg-BID  Cayston INH 75 mgTID  Dulera HFAA 200-5 mcg/ 2puffs-BID      Home Equipment:   Nebulizer Company: Radiation protection practitioner Company: N/A  Oxygen Company: N/A      ACT Devices/Methods:  For airway clearance, uses Aerobika 2 times per day. He stated that he has been doing his Albuterol and Saline with it. He stated that he does the Albuterol and saline separately. Per Brett Hanna, it takes him 1.5 hours to nebulize all of his medications and that it takes up too much time of his day. I suggested that he use his Albuterol HFA instead of his inhaled Albuterol as that only takes about 2 minutes to do and to wait at least 5 minutes before nebulizing his saline. He had a very hard time understanding that he did not have to do his inhaled Albuterol, that using his HFA was perfectly fine.     Today Brett Hanna was unable to demonstrate proper technique. I explained to him how it should be done. I went over the proper order for doing his medications and explained the reasoning why. Dr. Garner Hanna had done the same prior to me entering the room. He tried to keep going back to that is not what he was told. I explained to him that he had been misinformed and that from day forward we would like him to do it this way. I gave him a handout to take home on the proper order for doing his medications.     Brett Hanna stated that his equipment is not functioning properly. He stated that his nebulizer machine was taking too long nebulizing his medications and that he would like a new one that was stronger. I reminded him that the Colistin is in a thicker suspension and it takes longer to nebulize it. We will send in a prescription for a new nebulizer machine and for reusable neb cups. I reminded him to call should something change.     Review of Cleaning Equipment:  To clean his equipment, Brett Hanna's wife has been cleaning his equiment. She has been washing in water and vinegar as they were told. Dr. Garner Hanna went over the way it needs to be done. I reeducated him and his wife on how to properly clean/sterilize his equipment and the reason for doing so. I gave him a handout on how to properly clean/sterilize his equipment to take home.     Spacer Teaching: N/A    Notes:    Brett Hanna was unable to tell me that names of his medications that he uses for his therapies. He kept referring to his inhaled Albuterol as the medication in the vial. He did have the saline right, but unable to explain why he was using it for. Brett Hanna had a hard time pronouncing his Elwin Sleight and that he is do it first as he was told. I had to explain to him a few times what that medication is and does, and why its done last.     Dr. Garner Hanna thought that maybe a vest would be a good idea for him and I thought so too. I was going to go over it with him and his wife, but Brett Hanna and his wife were walking out the door as I was finishing his education on how to do his therapies.

## 2017-07-01 MED FILL — PARI LC PLUS NEBULIZER SET//MISC: PARI LC PLUS NEBULIZER SET//MISC | 30 days supply | Qty: 1 | Fill #1

## 2017-07-01 MED FILL — 3ML LUER-LOK SYRINGE/21GX1/MISC: 3ML LUER-LOK SYRINGE/21GX1/MISC | 30 days supply | Qty: 60 | Fill #1

## 2017-07-01 MED FILL — ALCOHOL PADS//: ALCOHOL PADS// | 50 days supply | Qty: 1 | Fill #1

## 2017-07-01 MED FILL — MONTELUKAST SODIUM/10MG/TABS: MONTELUKAST SODIUM/10MG/TABS | 30 days supply | Qty: 30 | Fill #1

## 2017-07-01 MED FILL — NORMAL SALINE FLUSH FOR F/0.9%/SOLN: NORMAL SALINE FLUSH FOR F/0.9%/SOLN | 30 days supply | Qty: 600 | Fill #1

## 2017-07-01 MED FILL — COLISTIMETHATE SODIUM/150MG/SOLR: COLISTIMETHATE SODIUM/150MG/SOLR | 30 days supply | Qty: 60 | Fill #1

## 2017-07-02 ENCOUNTER — Ambulatory Visit
Admission: RE | Admit: 2017-07-02 | Discharge: 2017-07-02 | Disposition: A | Discharge status: Home / Self Care | Payer: MEDICARE

## 2017-07-02 ENCOUNTER — Ambulatory Visit
Admission: RE | Admit: 2017-07-02 | Discharge: 2017-07-02 | Disposition: A | Discharge status: Home / Self Care | Payer: MEDICARE | Attending: Internal Medicine

## 2017-07-02 DIAGNOSIS — R768 Other specified abnormal immunological findings in serum: Secondary | ICD-10-CM

## 2017-07-02 DIAGNOSIS — J471 Bronchiectasis with (acute) exacerbation: Principal | ICD-10-CM

## 2017-07-02 DIAGNOSIS — E119 Type 2 diabetes mellitus without complications: Principal | ICD-10-CM

## 2017-07-02 DIAGNOSIS — J479 Bronchiectasis, uncomplicated: Secondary | ICD-10-CM

## 2017-07-02 LAB — CBC W/ AUTO DIFF
BASOPHILS ABSOLUTE COUNT: 0 10*9/L (ref 0.0–0.1)
EOSINOPHILS ABSOLUTE COUNT: 0.1 10*9/L (ref 0.0–0.4)
HEMATOCRIT: 45.8 % (ref 41.0–53.0)
HEMOGLOBIN: 15.2 g/dL (ref 13.5–17.5)
LYMPHOCYTES ABSOLUTE COUNT: 0.6 10*9/L — ABNORMAL LOW (ref 1.5–5.0)
MEAN CORPUSCULAR HEMOGLOBIN CONC: 33.1 g/dL (ref 31.0–37.0)
MEAN CORPUSCULAR HEMOGLOBIN: 29.6 pg (ref 26.0–34.0)
MEAN CORPUSCULAR VOLUME: 89.4 fL (ref 80.0–100.0)
MEAN PLATELET VOLUME: 7.6 fL (ref 7.0–10.0)
MONOCYTES ABSOLUTE COUNT: 0.4 10*9/L (ref 0.2–0.8)
NEUTROPHILS ABSOLUTE COUNT: 4.6 10*9/L (ref 2.0–7.5)
RED CELL DISTRIBUTION WIDTH: 14.7 % (ref 12.0–15.0)
WBC ADJUSTED: 5.7 10*9/L (ref 4.5–11.0)

## 2017-07-02 LAB — URINALYSIS
BACTERIA: NONE SEEN /HPF
BILIRUBIN UA: NEGATIVE
BLOOD UA: NEGATIVE
GLUCOSE UA: 300 — AB
KETONES UA: NEGATIVE
LEUKOCYTE ESTERASE UA: NEGATIVE
NITRITE UA: NEGATIVE
PROTEIN UA: NEGATIVE
RBC UA: <1 /HPF (ref <3)
SPECIFIC GRAVITY UA: 1.021 (ref 1.003–1.030)
SQUAMOUS EPITHELIAL: <1 /HPF (ref 0–5)
UROBILINOGEN UA: 0.2
WBC UA: 1 /HPF (ref <2)

## 2017-07-02 LAB — CREATININE, URINE: Lab: 147.3

## 2017-07-02 LAB — LARGE UNSTAINED CELLS: Lab: 2

## 2017-07-02 LAB — ALBUMIN / CREATININE URINE RATIO: ALBUMIN/CREATININE RATIO: 12.9 ug/mg (ref 0.0–30.0)

## 2017-07-02 LAB — WBC UA: Lab: 1

## 2017-07-02 MED ORDER — SERTRALINE 50 MG TABLET: 50 mg | tablet | Freq: Every day | 3 refills | 0 days | Status: AC

## 2017-07-02 MED ORDER — SERTRALINE 50 MG TABLET
ORAL_TABLET | Freq: Every day | ORAL | 3 refills | 0.00000 days | Status: CP
Start: 2017-07-02 — End: 2017-07-02

## 2017-07-02 MED FILL — MINOCYCLINE HCL/100MG/CAPS: MINOCYCLINE HCL/100MG/CAPS | 14 days supply | Qty: 28 | Fill #0

## 2017-07-02 MED FILL — PREDNISONE/20MG/TABS: PREDNISONE/20MG/TABS | 5 days supply | Qty: 10 | Fill #0

## 2017-07-02 MED FILL — SERTRALINE HCL/50MG/TABS: SERTRALINE HCL/50MG/TABS | 60 days supply | Qty: 60 | Fill #0

## 2017-07-02 NOTE — Unmapped (Addendum)
Assessment and Plan    1. Type 2 diabetes mellitus without complication, without long-term current use of insulin (CMS-HCC)    2. Bronchiectasis without complication (CMS-HCC)        #1. Type 2 Diabetes Mellitus: A1C today was 7.1, down from 8.4 three months ago. Patient will continue to take Metformin as prescribed, 2 tablets (500mg  each) in the morning.   Patient counseled to take 1 tablet in morning and 1 in evening if 2 tablets at once causes stomach upset  - Metformin 2 tablets (1000mg  total) every morning  - Urine albumin/Creatinine ratio  - Retinal eye exam  - Counseled on dietary recommendations     #2.Bronchiectasis: Doing and feeling much better, less episodes of coughing,  - Continue current medication regimen  - F/u with Prednisone and Minocycline prescribed by Pulmonologist  - CBC      Orders Placed This Encounter   Procedures   ??? Microalbumin/Creatinine Urine Ratio (ZOX096)   ??? Urinalysis   ??? Ambulatory referral to Ophthalmology   ??? POCT A1C - RN Obtain         Follow up: Return in about 3 months (around 10/01/2017).        Reason for visit: Follow-up, bronchiectasis, DM      Brett Bowens Sr. is a 51 y.o. male with a history of bronchiectasis and DM2 who presents for follow up     HPI  Patient said he has been doing and feeling much better the past couple of weeks. He is having fewer coughing spells and less trouble breathing. He still had occasional chest tightness and periods of SOB walking up and down the stairs at home, which is normal for him.  He denies any recent infection, no coughing up blood, no unusual chest pain.      Medications were reviewed      Physical Exam  Gen: NAD,   Vitals:    07/02/17 0902   BP: 120/67   Pulse: 78   SpO2: 96%   Weight: 90.8 kg (200 lb 1.6 oz)   Height: 167 cm (5' 5.75)     CV: RRR, no mrg  Pulm: CTAB  Abdomen: obese, NABS, NT/ND  Ext: no edema      Visit and note completed by Carmelina Dane MD.

## 2017-07-02 NOTE — Unmapped (Addendum)
bloodwork  Urine testing      Your ultrasound shows fat in the liver but not gallstones    Keep up the great work with diet changes.  Your A1C is great at 7.1  You're due to eye exam

## 2017-07-08 NOTE — Unmapped (Signed)
Received message on nurse line from patient stating he never received his nebulizer.  Per chart note, an order was to be sent for patient to obtain a new nebulizer as his current one is not working properly.  This nurse does not see an order for an new nebulizer.  Please advise once order has been placed.  [facility] LPN

## 2017-07-10 MED FILL — OMEPRAZOLE/20MG/CPDR: OMEPRAZOLE/20MG/CPDR | 90 days supply | Qty: 180 | Fill #1

## 2017-07-14 NOTE — Unmapped (Signed)
Emailed a prescription to [employer] for a nebulizer machine.

## 2017-07-14 NOTE — Unmapped (Signed)
Emailed a prescription back to [employer] for a nebulizer machine and neb cup.

## 2017-07-19 NOTE — Unmapped (Addendum)
I saw and evaluated the patient, participating in the key portions of the service.  I reviewed the resident???s note.  I agree with the resident???s findings and plan. Danielle Dess, MD

## 2017-07-19 NOTE — Unmapped (Signed)
Addended by: Danielle Dess on: 07/19/2017 03:05 PM     Modules accepted: Level of Service

## 2017-07-23 MED ORDER — AZTREONAM LYSINE 75 MG/ML SOLUTION FOR NEBULIZATION
Freq: Three times a day (TID) | RESPIRATORY_TRACT | 5 refills | 0.00000 days | Status: CP
Start: 2017-07-23 — End: 2018-07-11

## 2017-07-23 NOTE — Unmapped (Signed)
Sent eRX to [facility] to see if they can run test claim.   PA through part D was denied, but should like get billed through Medicare Part B as primary since it is a nebulized solution.      Anell Barr, PharmD, BCPS, CPP  Clinical Pharmacist Practitioner  [facility] / [facility] Bronchiectasis Clinic  Pager: [phone]

## 2017-08-03 ENCOUNTER — Telehealth: Payer: Self-pay | Admitting: Pulmonary Disease

## 2017-08-03 NOTE — Telephone Encounter (Signed)
Called the number and provided the ext. It kept asking for the ext.   Patient has not been seen last 2017. He has cancelled several appts this year with BQ.

## 2017-08-04 NOTE — Telephone Encounter (Signed)
ATC to call Jake Martinez but like with Cherina, the automated prompt kept asking for the extension Tried to dial 0 for an operator, but line was quiet with no other options Will try again tomorrow

## 2017-08-05 NOTE — Telephone Encounter (Signed)
I have attempted to call the pharmacy and just like the last 2 phone notes---it keep asking for the ext you are trying to reach.  Will close as this is the 3rd time we have tried to contact them per protocol.

## 2017-08-10 NOTE — Unmapped (Signed)
Faxed a prescription to [facility].

## 2017-09-02 NOTE — Unmapped (Signed)
Left message about rescheduling appointment.

## 2017-09-07 MED FILL — MONTELUKAST SODIUM/10MG/TABS: MONTELUKAST SODIUM/10MG/TABS | 30 days supply | Qty: 30 | Fill #2

## 2017-09-09 NOTE — Unmapped (Signed)
Received a call from Jovaughn this morning from [employer] stating the PA for this patient has been denied.  Jovaughn states an appeal must now be done through [employer] [phone].  Once the denial is received from [employer], they can then provide assistance for this patient's Cayston.  [facility] LPN

## 2017-09-10 MED FILL — METFORMIN HCL/ER 500MG/TAB: METFORMIN HCL/ER 500MG/TAB | 90 days supply | Qty: 180 | Fill #1

## 2017-09-13 NOTE — Unmapped (Signed)
Entered in error

## 2017-09-16 MED ORDER — FLUTICASONE PROPIONATE 50 MCG/ACTUATION NASAL SPRAY,SUSPENSION: 2 | g | 5 refills | 0 days | Status: AC

## 2017-09-16 MED ORDER — FLUTICASONE PROPIONATE 50 MCG/ACTUATION NASAL SPRAY,SUSPENSION
NASAL | 5 refills | 0.00000 days | Status: CP
Start: 2017-09-16 — End: 2017-09-16

## 2017-09-16 MED FILL — SODIUM CHLORIDE/7%/NEBU: SODIUM CHLORIDE/7%/NEBU | 100 days supply | Qty: 600 | Fill #1

## 2017-09-20 MED FILL — DULERA/200-5MCG/AERO: DULERA/200-5MCG/AERO | 90 days supply | Qty: 3 | Fill #2

## 2017-09-20 MED FILL — FLUTICASONE/50MCG/ACT/SUSP: FLUTICASONE/50MCG/ACT/SUSP | 30 days supply | Qty: 1 | Fill #0

## 2017-09-21 NOTE — Unmapped (Signed)
[facility] Refill Coordination Note  Specialty Medication(s): COLISTIMETHATE  Additional Medications shipped: ZOLOFT, NORMAL SALINE, NEB CUP, SYRINGE, ALCOHOL PADS    Brett Bowens Sr., DOB: [birth year]  Phone: [phone] (home) [phone] (work), Alternate phone contact: N/A  Phone or address changes today?: No  All above HIPAA information was verified with patient.  Shipping Address: [address] [place], [place] [address]  Insurance changes? No    Completed refill call assessment today to schedule patient's medication shipment from the [facility] [phone]).      Confirmed the medication and dosage are correct and have not changed: Yes, regimen is correct and unchanged. Patient states he uses every other month and is on off month now.    Confirmed patient started or stopped the following medications in the past month:  No, there are no changes reported at this time.    Are you tolerating your medication?:  Brett Hanna reports tolerating the medication.    ADHERENCE    Did you miss any doses in the past 4 weeks? No missed doses reported. However this past month was off month, but did not miss any doses last month he used med per patient.    FINANCIAL/SHIPPING    Delivery Scheduled: Yes, Expected medication delivery date: 09/30/17     Brett Hanna did not have any additional questions at this time.    Delivery address validated in [facility] scheduling system: Yes, address listed in [facility] is correct.    We will follow up with patient monthly for standard refill processing and delivery.      Thank you,  Thad Ranger   [facility] Specialty Pharmacist

## 2017-09-28 MED FILL — NORMAL SALINE FLUSH/0.9%/SOLN: NORMAL SALINE FLUSH/0.9%/SOLN | 30 days supply | Qty: 60 | Fill #2

## 2017-09-28 MED FILL — 3ML LUER-LOK SYRINGE/21GX1/MISC: 3ML LUER-LOK SYRINGE/21GX1/MISC | 30 days supply | Qty: 60 | Fill #2

## 2017-09-28 MED FILL — COLISTIMETHATE SODIUM/150MG/SOLR: COLISTIMETHATE SODIUM/150MG/SOLR | 30 days supply | Qty: 60 | Fill #2

## 2017-09-28 MED FILL — SERTRALINE HCL/50MG/TABS: SERTRALINE HCL/50MG/TABS | 90 days supply | Qty: 90 | Fill #0

## 2017-09-28 MED FILL — PARI LC PLUS NEBULIZER SET//MISC: PARI LC PLUS NEBULIZER SET//MISC | 30 days supply | Qty: 1 | Fill #2

## 2017-09-28 MED FILL — ALCOHOL PADS//: ALCOHOL PADS// | 50 days supply | Qty: 1 | Fill #2

## 2017-10-15 NOTE — Unmapped (Signed)
Called to confirm that the patient had received his vest. He stated that he had and he is doing ok with it. When asked if he was coughing a little less and bringing up a little less sputum he stated that he thought he was but wasn't too sure as I always cough so its hard to know. He did state that he thought he would've been calling Dr. Garner Nash for an antibiotic last week, but he didn't have to and he liked that. He stated that if the vest makes stay out of the hospital and/or take less antibiotics he would be happy.     Patient stated that he is to receive his Cayston tomorrow but was confused as to when to start it because he would be doing his Colistin until the end of the month. I suggested that he put the Cayston in the fridge when he gets it and at the end of the month when he is done with his Colistin that he could start his Cayston. He stated that he never thought of that and decided that he would do that. He asked where to get a finger pulse ox and I told him the places that he could try. He stated that he wanted to have one to test his O2 sat from time to time. No other needs at this time.

## 2017-11-23 ENCOUNTER — Encounter: Admit: 2017-11-23 | Discharge: 2017-11-23 | Payer: PRIVATE HEALTH INSURANCE

## 2017-11-23 ENCOUNTER — Other Ambulatory Visit: Admit: 2017-11-23 | Discharge: 2017-11-23 | Payer: PRIVATE HEALTH INSURANCE

## 2017-11-23 ENCOUNTER — Encounter
Admit: 2017-11-23 | Discharge: 2017-11-23 | Payer: PRIVATE HEALTH INSURANCE | Attending: Internal Medicine | Primary: Internal Medicine

## 2017-11-23 ENCOUNTER — Non-Acute Institutional Stay: Admit: 2017-11-23 | Discharge: 2017-11-23 | Payer: PRIVATE HEALTH INSURANCE

## 2017-11-23 ENCOUNTER — Ambulatory Visit: Admit: 2017-11-23 | Discharge: 2017-11-23 | Payer: PRIVATE HEALTH INSURANCE

## 2017-11-23 DIAGNOSIS — E119 Type 2 diabetes mellitus without complications: Principal | ICD-10-CM

## 2017-11-23 DIAGNOSIS — G4733 Obstructive sleep apnea (adult) (pediatric): Secondary | ICD-10-CM

## 2017-11-23 DIAGNOSIS — E785 Hyperlipidemia, unspecified: Principal | ICD-10-CM

## 2017-11-23 DIAGNOSIS — K760 Fatty (change of) liver, not elsewhere classified: Secondary | ICD-10-CM

## 2017-11-23 DIAGNOSIS — J479 Bronchiectasis, uncomplicated: Secondary | ICD-10-CM

## 2017-11-23 DIAGNOSIS — Z23 Encounter for immunization: Secondary | ICD-10-CM

## 2017-11-23 DIAGNOSIS — G43909 Migraine, unspecified, not intractable, without status migrainosus: Secondary | ICD-10-CM

## 2017-11-23 DIAGNOSIS — J4520 Mild intermittent asthma, uncomplicated: Secondary | ICD-10-CM

## 2017-11-23 DIAGNOSIS — Z148 Genetic carrier of other disease: Secondary | ICD-10-CM

## 2017-11-23 DIAGNOSIS — J471 Bronchiectasis with (acute) exacerbation: Principal | ICD-10-CM

## 2017-11-23 LAB — CBC
HEMATOCRIT: 46.6 % (ref 41.0–53.0)
MEAN CORPUSCULAR HEMOGLOBIN CONC: 33.5 g/dL (ref 31.0–37.0)
MEAN CORPUSCULAR HEMOGLOBIN: 28.8 pg (ref 26.0–34.0)
MEAN CORPUSCULAR VOLUME: 86.1 fL (ref 80.0–100.0)
MEAN PLATELET VOLUME: 8 fL (ref 7.0–10.0)
PLATELET COUNT: 274 10*9/L (ref 150–440)
RED BLOOD CELL COUNT: 5.42 10*12/L (ref 4.50–5.90)

## 2017-11-23 LAB — COMPREHENSIVE METABOLIC PANEL
ALBUMIN: 4.8 g/dL (ref 3.5–5.0)
ALKALINE PHOSPHATASE: 88 U/L (ref 38–126)
ALT (SGPT): 36 U/L (ref 19–72)
BILIRUBIN TOTAL: 0.5 mg/dL (ref 0.0–1.2)
BLOOD UREA NITROGEN: 17 mg/dL (ref 7–21)
CALCIUM: 9.7 mg/dL (ref 8.5–10.2)
CHLORIDE: 106 mmol/L (ref 98–107)
CO2: 25 mmol/L (ref 22.0–30.0)
CREATININE: 0.83 mg/dL (ref 0.70–1.30)
EGFR MDRD AF AMER: >=60 mL/min/{1.73_m2} (ref >=60)
EGFR MDRD NON AF AMER: >=60 mL/min/{1.73_m2} (ref >=60)
GLUCOSE RANDOM: 115 mg/dL — ABNORMAL HIGH (ref 65–99)
POTASSIUM: 4.4 mmol/L (ref 3.5–5.0)
PROTEIN TOTAL: 7.4 g/dL (ref 6.5–8.3)
SODIUM: 142 mmol/L (ref 135–145)

## 2017-11-23 LAB — LDL CHOLESTEROL DIRECT: Cholesterol.in LDL:MCnc:Pt:Ser/Plas:Qn:Direct assay: 137.6 — ABNORMAL HIGH

## 2017-11-23 LAB — CHOLESTEROL: Cholesterol:MCnc:Pt:Ser/Plas:Qn:: 196

## 2017-11-23 LAB — HEMOGLOBIN A1C: Hemoglobin A1c/Hemoglobin.total:MFr:Pt:Bld:Qn:: 7.8 — ABNORMAL HIGH

## 2017-11-23 LAB — HDL CHOLESTEROL: Cholesterol.in HDL:MCnc:Pt:Ser/Plas:Qn:: 48

## 2017-11-23 LAB — MEAN CORPUSCULAR HEMOGLOBIN: Lab: 28.8

## 2017-11-23 LAB — ALKALINE PHOSPHATASE: Alkaline phosphatase:CCnc:Pt:Ser/Plas:Qn:: 88

## 2017-11-23 MED ORDER — FEXOFENADINE 180 MG TABLET: tablet | 3 refills | 0 days

## 2017-11-23 MED ORDER — ALBUTEROL SULFATE 2.5 MG/3 ML (0.083 %) SOLUTION FOR NEBULIZATION
Freq: Two times a day (BID) | RESPIRATORY_TRACT | 14 refills | 0.00000 days | Status: CP
Start: 2017-11-23 — End: 2018-11-23

## 2017-11-23 MED ORDER — FEXOFENADINE 180 MG TABLET
ORAL_TABLET | Freq: Every day | ORAL | 3 refills | 0.00000 days | Status: CP
Start: 2017-11-23 — End: 2017-11-23

## 2017-11-23 MED ORDER — NEBULIZER ACCESSORIES KIT
PACK | 11 refills | 0 days | Status: CP
Start: 2017-11-23 — End: 2018-02-01

## 2017-11-23 MED ORDER — CETIRIZINE 10 MG TABLET
ORAL_TABLET | Freq: Every day | ORAL | 3 refills | 0.00000 days | Status: CP
Start: 2017-11-23 — End: 2017-11-23

## 2017-11-23 MED ORDER — ALBUTEROL SULFATE HFA 90 MCG/ACTUATION AEROSOL INHALER
RESPIRATORY_TRACT | 11 refills | 0.00000 days | Status: CP | PRN
Start: 2017-11-23 — End: 2017-11-23

## 2017-11-23 MED ORDER — ALBUTEROL SULFATE HFA 90 MCG/ACTUATION AEROSOL INHALER: 2 | Inhaler | 11 refills | 0 days | Status: AC

## 2017-11-23 MED ORDER — TOPIRAMATE 100 MG TABLET
ORAL_TABLET | Freq: Every evening | ORAL | 3 refills | 0.00000 days | Status: CP
Start: 2017-11-23 — End: 2018-12-20

## 2017-11-23 MED ORDER — ALBUTEROL SULFATE 2.5 MG/3 ML (0.083 %) SOLUTION FOR NEBULIZATION: mL | 14 refills | 0 days

## 2017-11-23 MED ORDER — CETIRIZINE 10 MG TABLET: 10 mg | tablet | Freq: Every day | 3 refills | 0 days | Status: AC

## 2017-11-23 MED ORDER — TOPIRAMATE 100 MG TABLET: tablet | 3 refills | 0 days | Status: AC

## 2017-11-23 NOTE — Unmapped (Signed)
Assessment and Plan    1. Obstructive sleep apnea syndrome    2. Type 2 diabetes mellitus without complication, without long-term current use of insulin (CMS-HCC)    3. Bronchiectasis without complication (CMS-HCC)    4. Migraine without status migrainosus, not intractable, unspecified migraine type    5. Hyperlipidemia, unspecified hyperlipidemia type    6. Alpha-1-antitrypsin deficiency carrier (CMS-HCC)    7. Fatty liver        #1. DM2. Check A1C today. Referred to ophtho for retinopathy screen. Discussed improving diet with eating more fruits and veggies.    #2. Fatty liver. Discussed improving diet and exercise to help with this. With his increasing abd distension, check liver labs and plts, abd [place]. Questionable fluid wave on exam. In setting of alpha-1-antitrypsin deficiency carrier and these findings will refer to hepatology. Also appreciate their recs about use of statin (which would be indicated in him for primary prevention). Had bump in LFTs many years ago in setting of using statin-but also he had a surgery and other potential etiologies.    #3. Bronchiectasis and OSA. Following closely w/pulm. I agree that pulmonary rehab would be excellent for him.     #4. HM. Check lipids.  The 10-year ASCVD risk score Denman George DC Jr., et al., 2013) is: 7.6%  -statin would be indicated.    #5. Meds. Med rec done. Refills sent in.    #6. Migraines. Stable on current regimen. Sent in refills.    #7. Anxiety. Better controlled w/SSRI. Cont present management.    Orders Placed This Encounter   Procedures   ??? US Abdomen Complete   ??? Comprehensive Metabolic Panel   ??? Hemoglobin A1c   ??? Cholesterol, Total   ??? HDL Cholesterol   ??? LDL Cholesterol, Direct   ??? CBC   ??? Ambulatory referral to Ophthalmology   ??? Ambulatory referral to Hepatology         Follow up: Return in about 6 weeks (around 01/04/2018), or if symptoms worsen or fail to improve.        Reason for visit: Follow-up  , abd distension    Brett Bowens Sr. is a 52 y.o. male with bronchiectasis, asthma, OSA, alpha-1 antitrypsin carrier, fatty liver who presents for follow up.    HPI  Using the vest. Feels like it loosens up the phlegm some. Does have some better days from it.    Some sinus congestion. Some discharge from the eye.     Got flu shots in December.     Thinks the stress feeling is better than before. Nothing he can't deal with. Doesn't last very long.     Needs med refills.    Significant problems with abdominal distension, feels like abd gets tight Doesn't hurt. Does notice it more when he's coughing a lot.     Medications were reviewed    Physical Exam  Gen: well appearing, NAD  Vitals:    11/23/17 1042   BP: 123/72   Pulse: 75   Weight: 91.4 kg (201 lb 8 oz)   Height: 166.9 cm (5' 5.71)     GI: soft, protuberant, mildly distended. Nontender. + bs    The 10-year ASCVD risk score Denman George DC Jr., et al., 2013) is: 7.7%    Values used to calculate the score:      Age: 89 years      Sex: Male      Is Non-Hispanic African American: No      Diabetic:  Yes      Tobacco smoker: No      Systolic Blood Pressure: 123 mmHg      Is BP treated: No      HDL Cholesterol: 43 mg/dL      Total Cholesterol: 183 mg/dL    Note: For patients with SBP <90 or >200, Total Cholesterol <130 or >320, HDL <20 or >100 which are outside of the allowable range, the calculator will use these upper or lower values to calculate the patient???s risk score.    PHQ9: 5  GAD7: 2    Reviewed pulm note. Referral to pulm rehab. Encouraged sinus rinses.    A1C 7.1 in Aug    Reviewed labs from 8/18

## 2017-11-23 NOTE — Unmapped (Signed)
Bronchiectasis Clinic  Assessment    Confirmed that patient received nebulizer compressor.  No questions or concerns about compressor at this time.     Bipap/Cpap; Discussed CPAP care and cleaning with patient.  Patient states that DME company provides a new mask every 3 months.    Equipment Review/Cleaning:  LC Plus cleaning and sterilization reviewed.  Discussed microwave sterilization vs. Boiling technique.  Patient over boiled LC Plus and it melted.  New script obtained and sent to DME.      All questions and concerns answered.

## 2017-11-23 NOTE — Unmapped (Addendum)
Symptoms of a bronchiectasis exacerbation: 48 hours of at least three of the following:  ?? Increased cough  ?? Change in volume or appearance of sputum  ?? Increased sputum purulence  ?? Worsening shortness of breath and/or exercise tolerance  ?? Fatigue and/or malaise  ?? Coughing up blood (hemoptysis)    If you are experiencing some of these symptoms:  ?? Increase the frequency and/or intensity of your airway clearance  ?? Try to submit a sputum sample for bacterial and AFB cultures (at [facility] or locally)  ?? Reach out to me or your local physician.  ?? If you need antibiotics, recommend treating for 14 days.      Please bring any new airway clearance equipment to your next visit to ensure that you are using and caring for it properly.    Call [facility] at [phone], extension [phone] to request your colistin shipment. I also ordered nebulizer cups with your saline.    You were given the Pneumovax 23 today.    Irving Burton will call you about the Colistin set up.    Ordering pulmonary rehab for [facility].    Get back to doing saline rinses.    Winter Health Advisory:  ?? Wash hands frequently.  Hand sanitizer works well too.  ?? Try to avoid those with illness.  ?? Minimize touching your face.  ?? If traveling by plane, consider wearing a mask or using a sinus rinse after landing.  ?? Get the flu shot if you haven't already.  ?? Call if you feel problems getting into your chest--don't wait too late to call.  ?? Call if symptoms of the flu (sudden onset of high fever, body aches, severe fatigue) or exposed to someone with flu.  Want to start Tamiflu in 24-48 hours.      Thank you for allowing me to be a part of your care. Please call the clinic with any questions.    Viona Gilmore, MD, MPH  Pulmonary and Critical Care Medicine  [address]  [id number]  [place], [place] [address]    Thank you for your visit to the [facility] Pulmonary Clinics. You may receive a survey from [facility] regarding your visit today, and we are eager to use this feedback to improve your experience. Thank you for taking the time to fill it out.    Between appointments, you can reach [place] at these numbers:    For appointments or the Pulmonary Nurse: [phone]  Fax: [phone]    For urgent issues after hours:  Hospital Operator: [phone], ask for Pulmonary Fellow on call    For further information, check out the websites below:    [facility] for Bronchiectasis: [email or web address]    World Bronchiectasis Conference in [place] Patient session May 15, 2017: Videos can be watched here.     Bronchiectasis Toolbox (information about airway clearance): DiningCalendar.de    Copy for Individuals with Bronchiectasis and/or NTM through the Bronchiectasis Registry and the COPD Foundation: https://www.mckee-young.org/    Information about Non-tuberculous Mycobacterial Infections:  https://www.ntminfo.org     Interested in clinical trials and other research opportunities?  www.clinicaltrials.gov  The Rare Diseases Clinical Research Network [facility]) Genetic Disorders of  Mucociliary Clearance Consortium [facility]) Contact Registry is a way for patients with disorders of mucociliary clearance (such as bronchiectasis) and their family members to learn about research studies they may be able to join. Participation is completely voluntary and you may choose to withdraw at any  time. There is no cost to join the Contact Registry. For more information or to join the registry please go to the following website:  https://www.rarediseasesnetwork.org/cms/gdmcc/Get-Involved/Contact-Registry

## 2017-11-23 NOTE — Unmapped (Signed)
[facility]  [facility] for Bronchiectasis [facility]    Assessment:      Patient:Brett D Donahey Sr. ([birth year])  Reason for visit: Mr.[name] is a 52 y.o.male who returns for follow-up of bronchiectasis secondary to alpha-1 antitrypsin carrier state with prior NTM infection status post treatment and OSA.      Plan:      Problem List Items Addressed This Visit        Respiratory    Bronchiectasis (CMS-HCC) - Primary     Bronchiectasis Severity Index Score:    Age 34-69 = 2   BMI 18.5 or higher = 0   FEV1 % predicted 50-80% = 1   Hospitalized for severe exacerbation in past 2 years No = 0   Exacerbations in previous year 3 or more = 2   mMRC dyspnea score 1 = I get short of breath when hurrying on level ground or walking up a slight hill.   Chronic Pseudomonas colonization (at least 2 positive cultures at least 3 months apart within 1 year) No = 0   Colonization with other potential pathogenic bacteria (at least 2 positive cultures at least 3 months apart within 1 year) No = 0   Radiologic Severity 3 or more lobes or cystic brx = 1       Total Score 7     BSI score of 5-8 = Moderate bronchiectasis (1 year outcomes 0.8 - 4.8% mortality, 1.0 - 7.2% hospitalization rate; 4 year outcomes 4 - 11.3% mortality, 9.9 - 19.4% hospitalization)    Clinton Quant JD et al. The Bronchiectasis Severity Index: An International Derivation and Validation Study. AJRCCM, 2013; 189(5): [phone].)         Relevant Medications    nebulizer accessories Kit    Other Relevant Orders    Ambulatory referral to Pulm Rehab    Obstructive sleep apnea syndrome      Other Visit Diagnoses     Need for 23-polyvalent pneumococcal polysaccharide vaccine        Relevant Orders    Pneumococcal polysaccharide vaccine 23-valent greater than or equal to 2yo subcutaneous/IM (Pneumovax) (Completed)      Reinforced importance of regular airway clearance.  In bronchiectasis, there is impaired mucus clearance from both the airway changes and from the underlying cause of the bronchiectasis.  This impaired clearance leads to accumulation of mucus, resulting in inflammation, accumulation of bacteria, and worsening damage to the airways.  Therefore, augmentation of airway clearance is critical. Brett Hanna was provided websites with information about bronchiectasis, social forums for patients with bronchiectasis and NTM, and airway clearance techniques    Patient met with the respiratory therapist today to discuss airway clearance methods and discuss cleaning of nebulizer equipment. Patient should bring any new airway clearance devices to the next visit to review technique with the respiratory therapist.  The following changes were made to the inhaled medications: okay to skip third nebulized albuterol if feeling okay. Sent order for nebulizer cups to [facility] to be dispensed with his hypertonic saline.  Reminded him that his cups are reusable cups and can be used for up to 6 months. Provided him with phone number for [facility] so he can call to set up his Colistin if he doesn't hear from them soon.    Patient was unable to expectorate sputum today to be sent for bacterial and AFB cultures.  If he has future exacerbations, sputum should be sent for bacterial  and AFB cultures. The bacterial culture should be processed like a cystic fibrosis sample given the overlapping pathogens. There are standing orders for these cultures in our system.    Referral to local pulmonary rehabilitation program was made. He was given a spacer today to use with his albuterol HFA.    He is up to date on his influenza vaccine.  He was given the Pneumovax23 today since it has been over 5 years since he last received it.    Encouraged Brett Hanna to resume his sinus rinses with distilled water or water that was boiled and has cooled to room temperature.    Brett Hanna will return to clinic in 3 months for follow-up with pre-bronchodilator spirometry and sputum cultures. He will call or send me a message via MyChart if questions or concerns arise before this visit.      Subjective:      HPI: Brett Hanna is a 52 y.o. male who returns for follow-up of bronchiectasis secondary to alpha-1 antitrypsin carrier state with prior NTM infection status post treatment and OSA.       Since the last clinic visit on 06/29/17:  ?? he has been treated for an exacerbation 0 times.  his last exacerbation was August 2018.   ?? he has not been admitted for an exacerbation.  ?? he has not experienced hemoptysis.   ?? Had infection with increased wheezing that lasted about 1 week and suddenly stopped. Developed 2-3 weeks ago.    ?? Cough is productive of pale sputum. Some days thick and some days can't cough up anything (or less). Volume varies by day. This is better.     ?? Wheezing is present, severity varies. Gets better at rest, worse with activity. Sometimes wakes up with it.  Couple times per week wakes up with wheezing.  Wakes up with light headache then.  Not waking up in the middle the night with wheezing.   Chest tightness is present intermittently, when less productive cough. Using rescue albuterol inhaler 2 times per day at least (no spacer).  In addition to the three times a day nebulized (twice a day before HTS).   ?? Pleurisy is absent.  ?? Alternates inhaled colistin and cayston monthly. Currently on Cayston this month.    For airway clearance, Brett Hanna is using Vest 2 time a day, Aerobika 2 times per day and Hypertonic Saline 7% 2 times per day.  Likes the new Vest - feels like it helps him to cough up mucus more easily.  Exercise capacity is rated as good. MMRC of 1 = I get short of breath when hurrying on level ground or walking up a slight hill.  Brett Hanna is not on home oxygen therapy.  Has CPAP at night.    Review of Systems  No chills, fever, night sweats. Left ear fullness. Sinus pressure and congestion. Remainder of a complete review of systems was negative unless mentioned above.    Past Medical History:   Diagnosis Date   ??? Allergic rhinitis    ??? Alpha-1-antitrypsin deficiency carrier (CMS-HCC)     with slightly low levels   ??? Asthma    ??? Asthma, extrinsic    ??? Bronchiectasis (CMS-HCC) 06/12/2011   ??? Chronic sinusitis    ??? Colon polyps    ??? Diabetes mellitus (CMS-HCC)    ??? GERD (gastroesophageal reflux disease)    ??? Impaired glucose tolerance    ??? Obesity    ??? Pneumonia    ??? Pseudomonas respiratory infection    ???  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 Sleep apnea    ??? Strep throat        Current Outpatient Prescriptions   Medication Sig Dispense Refill   ??? acetaminophen (TYLENOL EXTRA STRENGTH) 500 MG tablet Take 1 tablet by mouth as needed for pain     ??? albuterol (PROVENTIL HFA;VENTOLIN HFA) 90 mcg/actuation inhaler Inhale 2 puffs every four (4) hours as needed for wheezing. 1 Inhaler 11   ??? albuterol 2.5 mg /3 mL (0.083 %) nebulizer solution Inhale 3 mL by nebulization every six (6) hours as needed for wheezing or shortness of breath. 270 vial 6   ??? alcohol swabs PadM Use as directed with inhaled antibiotics 100 each 5   ??? aztreonam lysine (CAYSTON) 75 mg/mL Nebu nebulization solution Inhale 1 mL (75 mg total) Three (3) times a day. 28 days on and 28 days off. 84 mL 5   ??? cetirizine (ZYRTEC) 10 MG tablet Take 10 mg by mouth daily.     ??? cholecalciferol, vitamin D3, (VITAMIN D3) 2,000 unit cap Take 1 capsule (2,000 Units total) by mouth daily. 30 capsule prn   ??? colistimethate (COLYMYCIN) 150 mg injection Inhale 2 mL (150 mg total) Two (2) times a day. Inject 2mL SWFI into colistin vial, then withdraw 2mL (150mg ) for dose. 60 vial 5   ??? cyclobenzaprine (FLEXERIL) 5 MG tablet Take 1 tablet (5 mg total) by mouth Three (3) times a day. (Patient taking differently: Take 1 tablet by mouth three times daily as needed) 20 tablet 0   ??? fexofenadine (ALLEGRA) 180 MG tablet Take 180 mg by mouth daily.     ??? fluticasone (FLONASE) 50 mcg/actuation nasal spray USE 2 SPRAYS IN EACH NOSTRIL DAILY 16 g 5   ??? metFORMIN (GLUCOPHAGE-XR) 500 MG 24 hr tablet Take 2 tablets (1,000 mg total) by mouth every morning before breakfast. 180 tablet 3   ??? MISCELLANEOUS MEDICAL SUPPLY MISC Frequency:PHARMDIR   Dosage:0.0     Instructions:  Note:CPAP 15 cm H20 with heated humidity for nasal dryness.  Mask: ResMed Quattro size Medium. Dx OSA. Dose: 1     ??? mometasone-formoterol (DULERA) 200-5 mcg/actuation HFAA Inhale 2 puffs Two (2) times a day. 1 Inhaler 11   ??? montelukast (SINGULAIR) 10 mg tablet Take 1 tablet (10 mg total) by mouth nightly. 90 tablet 3   ??? nebulizer and compressor (VIOS AEROSOL DELIVERY SYSTEM) Devi Please dispense one Pari Vios Pro and Pari LC Plus neb cup to be used with inhaled medications. 1 each 0   ??? nystatin (MYCOSTATIN) 100,000 unit/mL suspension Take 5 mL by mouth.     ??? omeprazole (PRILOSEC) 20 MG capsule Take 1 capsule (20 mg total) by mouth Two (2) times a day. (Patient taking differently: 2 tablets by mouth every morning) 180 capsule 3   ??? PARI LC D NEBULIZER Misc Provide 1 device  for use with inhaled medication. 1 each 5   ??? rizatriptan (MAXALT) 10 MG tablet Take one tablet at onset of migraine.  May repeat in  2 hours. 10 tablet 12   ??? sertraline (ZOLOFT) 50 MG tablet Take 1 tablet (50 mg total) by mouth daily. 90 tablet 3   ??? sodium chloride (NS) Soln Use 2mL NaCl 0.9% to mix colistin vial, Add extra 1mL NaCl into neb cup along with mixed colistin,total volume 3mL. 600 mL 5   ??? sodium chloride 7% 7 % Nebu INHALE 3 ML VIA NEBUZLIER TWICE DAILY 540 mL PRN   ???  sterile water, PF, Soln Add 1mL SWFI into neb cup along with colistin. 60 mL 5   ??? syringe with needle (SYRINGE 3CC/20GX1) 3 mL 20 gauge x 1 Syrg For use with inhaled antibiotic 60 Syringe 5   ??? topiramate (TOPAMAX) 100 MG tablet TAKE ONE TABLET BY MOUTH AT BEDTIME 90 tablet 3     No current facility-administered medications for this visit.        Allergies  Reviewed on: 11/23/2017      Reactions Comment    Codeine Shortness Of Breath, Rash Rifampin  Fevers, chills.    Daliresp [roflumilast] Other (See Comments) Back pain          Social History     Social History   ??? Marital status: Married     Spouse name: Lupita Leash   ??? Number of children: 3   ??? Years of education: N/A     Occupational History   ???  Disabled     Social History Main Topics   ??? Smoking status: Never Smoker   ??? Smokeless tobacco: Former Neurosurgeon   ??? Alcohol use No   ??? Drug use: No   ??? Sexual activity: Not Asked     Other Topics Concern   ??? None     Social History Narrative    Hasn't yet required services for disability. 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         Objective:   Objective   BP 116/83  - Pulse 79  - Temp 36.6 ??C (97.9 ??F) (Oral)  - Resp 20  - Ht 166.9 cm (5' 5.7)  - Wt 91.2 kg (201 lb)  - SpO2 94%  - BMI 32.74 kg/m??   General Appearance:   Overweight Caucasian male appearing comfortable, non-toxic, in no distress, and stated age.   Eyes:  PERRL, conjunctiva clear, EOM's intact. No drainage.   Ears: Normal TM's and external ear canals bilaterally   Nose: Nares normal, septum midline. Markedly congested nasal mucosa with mild erythema.  No drainage or polyps.  No sinus tenderness.   Oropharynx: Moist mucus membranes without lesions or thrush.  Good dentition.  Posterior pharynx clear.  Mallampati Grade IV airway.   Respiratory:   Coarse breath sounds throughout with high pitch wheeze and short crackles with forceful coughing. Easy work of breathing without accessory muscle use.     Cardiovascular:  Regular rate and rhythm. S1 and S2 normal. No murmur, rub  or gallop.  Pulses intact and symmetric.  No peripheral cyanosis or edema.   Gastrointestinal:   Abdomen protuberant.    Musculoskeletal: No tenderness or deformity of the chest wall.  Joints normal.   No clubbing.   Skin: No rashes, lesions, skin changes.   Heme/Lymph: No cervical, supraclavicular, submandibular, or suprasternal adenopathy.  No bruising or petechiae.   Neurologic: Alert and oriented x3. No focal neurological deficits.  Normal gait.      Diagnostic Review:   Pulmonary Functions Testing Results:    Measures are suggestive of moderate restriction. Values are decreased as compared to 03/25/17 and are not significantly changed from 06/29/17..      Chest CT (03/30/17): Images personally reviewed. Interval worsening of bilateral lower lobe bronchiectasis and bronchial wall thickening with new lingular bronchiectasis. Increased clustered groundglass opacities with areas of consolidation in the lower lobes and lingula. Findings may represent sequela of endobronchial infection or impaired mucus clearance.  Bronchiectasis has been present on CT imaging since 06/12/11.  Cultures  Lab Results   Component Value Date    LABLORES OROPHARYNGEAL FLORA ISOLATED 06/29/2017    ORG Haemophilus influenzae (A) 06/26/2014    ORG Mould (A) 06/26/2014    ORG Mycobacterium avium complex (A) 06/26/2014    ORG Actinomadura species (A) 06/26/2014    AFBCX  06/29/2017     Overgrown with bacteria/fungus; unable to evaluate for acid fast bacilli.    AFBCX No Acid Fast Bacilli Detected 03/25/2017     Labs:  Hepatic Function Panel    Collection Time: 03/30/17 11:41 AM   Result Value Ref Range    Albumin 4.7 3.5 - 5.0 g/dL    Total Protein 7.5 6.6 - 8.0 g/dL    Total Bilirubin 0.6 0.0 - 1.2 mg/dL    Bilirubin, Direct 1.61 0.00 - 0.40 mg/dL    AST 20 19 - 55 U/L    ALT 36 19 - 72 U/L    Alkaline Phosphatase 74 38 - 126 U/L   Anti-neutrophilic Cytoplasmic Antibody (ANCA)    Collection Time: 03/30/17 11:41 AM   Result Value Ref Range    ANCA Screen      IFA Positive, Perinuclear Pattern (A) Negative    MPO-Elisa Negative Negative    MPO-Quant 2.2 <21.0 U/mL    PR3 Elisa Negative Negative    PR3-Quant 6.4 <21.0 U/mL   IgE Total    Collection Time: 03/30/17 11:41 AM   Result Value Ref Range    IgE, Total 20.9 3 - 48 kU/L   CBC w/ Differential    Collection Time: 03/30/17 11:41 AM   Result Value Ref Range    WBC 5.3 4.5 - 11.0 10*9/L    RBC 5.25 4.50 - 5.90 10*12/L    HGB 15.0 13.5 - 17.5 g/dL    HCT 09.6 04.5 - 40.9 %    MCV 84.4 80.0 - 100.0 fL    MCH 28.7 26.0 - 34.0 pg    MCHC 34.0 31.0 - 37.0 g/dL    RDW 81.1 91.4 - 78.2 %    MPV 7.5 7.0 - 10.0 fL    Platelet 238 150 - 440 10*9/L    Absolute Neutrophils 3.9 2.0 - 7.5 10*9/L    Absolute Lymphocytes 0.6 (L) 1.5 - 5.0 10*9/L    Absolute Monocytes 0.4 0.2 - 0.8 10*9/L    Absolute Eosinophils 0.2 0.0 - 0.4 10*9/L    Absolute Basophils 0.0 0.0 - 0.1 10*9/L    Large Unstained Cells 2 0 - 4 %     Health Care Maintenance:  Most Recent Immunizations   Administered Date(s) Administered   ??? Hep A / Hep B 08/26/2010   ??? Hepatitis B, Adult 09/16/2010   ??? INFLUENZA TIV (TRI) PF (IM) 11/17/2011   ??? Influenza Vaccine Quad (IIV4 PF) 11mo+ injectable 09/22/2016   ??? Influenza Vaccine Quad (IIV4 W/PRESERV) 64MO+ 09/26/2013   ??? Influenza Virus Vaccine, unspecified formulation 10/08/2017   ??? PNEUMOCOCCAL POLYSACCHARIDE 23 07/10/2010   ??? Pneumococcal Conjugate 13-Valent 11/24/2013   ??? TdaP 12/22/2008

## 2017-11-23 NOTE — Unmapped (Signed)
Pneumovax 23 administered as ordered.  Pt tolerated well, no reactions noted.  [facility] LPN

## 2017-11-23 NOTE — Unmapped (Signed)
Bronchiectasis Severity Index Score:    Age 53-69 = 2   BMI 18.5 or higher = 0   FEV1 % predicted 50-80% = 1   Hospitalized for severe exacerbation in past 2 years No = 0   Exacerbations in previous year 3 or more = 2   mMRC dyspnea score 1 = I get short of breath when hurrying on level ground or walking up a slight hill.   Chronic Pseudomonas colonization (at least 2 positive cultures at least 3 months apart within 1 year) No = 0   Colonization with other potential pathogenic bacteria (at least 2 positive cultures at least 3 months apart within 1 year) No = 0   Radiologic Severity 3 or more lobes or cystic brx = 1       Total Score 7     BSI score of 5-8 = Moderate bronchiectasis (1 year outcomes 0.8 - 4.8% mortality, 1.0 - 7.2% hospitalization rate; 4 year outcomes 4 - 11.3% mortality, 9.9 - 19.4% hospitalization)    Clinton Quant JD et al. The Bronchiectasis Severity Index: An International Derivation and Validation Study. AJRCCM, 2013; 189(5): [id number].)

## 2017-11-23 NOTE — Unmapped (Signed)
[facility] Refill Coordination Note: Pulmonary         Brett Bowens Sr., DOB: January 08, 1966    Medication Adherence    Patient Reported X Missed Doses in the Last Month:  0  Specialty Medication:  COLISTIN KIT   Patient is on additional specialty medications:  No  Informant:  patient  Support Network for Adherence:  family member  Confirmed Plan for Next Specialty Medication Refill:  delivery by pharmacy  Refills Needed for Supportive Medications:  not needed  Medication Assistance Program  Refill Coordination  Has the Patients' Contact Information Changed:  No    Is the Shipping Address Different:  No    Shipping Information  Delivery Scheduled:  Yes  Delivery Date:  11/30/17  Medications to be Shipped:      MEDICATIONS    -Albuterol 2.5mg /42mL nebulized solution  -Inhaled colistin 150mg  and kit: sodium chloride 0.9%, syringes with needles, Pari nebulizer cup, alcohol swabs  -Flonase  -Cetirizine 10mg   -Montelukast 10mg   -FEXOFENADINE   - TOPIRAMATE                    -Confirmed the medication and dosage are correct and have not changed: Yes, regimen is correct and unchanged.             ADDITIONAL NOTES          NEEDS A NEW PRESCRIPTION FOR CETIRIZINE AND ALBUTEROL NEBS                      Ledora Bottcher

## 2017-11-23 NOTE — Unmapped (Signed)
Addended by: Alton Revere T on: 11/23/2017 02:05 PM     Modules accepted: Orders

## 2017-11-23 NOTE — Unmapped (Addendum)
Labwork today  I referred you to the eye doctor  I think pulmonary rehab is a good idea to increase exercise  I referred you to a liver doctor. Abdominal ultrasound.  I agree with trying to eat more fruits and vegetables.

## 2017-11-29 MED FILL — CETIRIZINE HCL/10MG/TABS: CETIRIZINE HCL/10MG/TABS | 90 days supply | Qty: 90 | Fill #0

## 2017-11-29 MED FILL — 3ML LUER-LOK SYRINGE/21GX1/MISC: 3ML LUER-LOK SYRINGE/21GX1/MISC | 30 days supply | Qty: 60 | Fill #3

## 2017-11-29 MED FILL — TOPIRAMATE/100MG/TABS: TOPIRAMATE/100MG/TABS | 90 days supply | Qty: 90 | Fill #0

## 2017-11-29 MED FILL — FLUTICASONE/50MCG/ACT/SUSP: FLUTICASONE/50MCG/ACT/SUSP | 30 days supply | Qty: 1 | Fill #1

## 2017-11-29 MED FILL — ALCOHOL PADS//: ALCOHOL PADS// | 50 days supply | Qty: 1 | Fill #3

## 2017-11-29 MED FILL — NORMAL SALINE FLUSH/0.9%/SOLN: NORMAL SALINE FLUSH/0.9%/SOLN | 30 days supply | Qty: 60 | Fill #3

## 2017-11-29 MED FILL — COLISTIMETHATE/150MG/INJ: COLISTIMETHATE/150MG/INJ | 30 days supply | Qty: 60 | Fill #3

## 2017-11-29 MED FILL — MONTELUKAST SODIUM/10MG/TABS: MONTELUKAST SODIUM/10MG/TABS | 30 days supply | Qty: 30 | Fill #3

## 2017-11-29 MED FILL — PARI LC PLUS NEBULIZER SET//MISC: PARI LC PLUS NEBULIZER SET//MISC | 30 days supply | Qty: 1 | Fill #3

## 2017-11-29 MED FILL — FEXOFENADINE HCL 180 MG TAB/180MG/TAB: FEXOFENADINE HCL 180 MG TAB/180MG/TAB | 90 days supply | Qty: 90 | Fill #0

## 2017-11-30 NOTE — Unmapped (Signed)
Rec'd call fr pt's wife requesting change in location for Pulm Rehab.  Evidently location at [facility] in [place] is too far for them to drive three times weekly.  As per their request, order for Pulmonary Rehab faxed to [facility] at [phone].

## 2017-12-03 NOTE — Unmapped (Signed)
Mr Brett Hanna is not able to attend consult appt. The distance is too far to travel 3 x a week. Trying to get enrolled in [place] location.

## 2017-12-06 ENCOUNTER — Ambulatory Visit: Admit: 2017-12-06 | Discharge: 2017-12-07 | Payer: PRIVATE HEALTH INSURANCE

## 2017-12-06 DIAGNOSIS — K760 Fatty (change of) liver, not elsewhere classified: Principal | ICD-10-CM

## 2017-12-16 MED ORDER — AMOXICILLIN 875 MG-POTASSIUM CLAVULANATE 125 MG TABLET
ORAL_TABLET | Freq: Two times a day (BID) | ORAL | 0 refills | 0.00000 days | Status: CP
Start: 2017-12-16 — End: 2017-12-30

## 2017-12-16 NOTE — Unmapped (Signed)
Hi Dr. Garner Nash,  Pt called to report that he has not been doing well over the past 3 days.  He has had increased daytime wheeze, sputum production, and nighttime cough.  His cough remains loose and productive for pale sputum.  He has not had fever.  He has continued his airway clearance and inhaled medications as ordered.  He is on Colistin this month.  He feels that he needs to be started on an abx and would like prescription sent to [facility] [place] [facility].  Please advise.  Thanks.

## 2017-12-17 NOTE — Unmapped (Signed)
Addended by: Viona Gilmore on: 12/16/2017 06:19 PM     Modules accepted: Orders

## 2017-12-17 NOTE — Unmapped (Signed)
Advised pt re plan for Augmentin.  He and/or his wife will call back with preference for local lab for me to send orders for CF culture to.

## 2017-12-17 NOTE — Unmapped (Signed)
Most recent sputum cultures yielding OPF and Stenotrophomonas (03/25/17).    Based on these results, will start Augmentin for 2 weeks.  Would like to obtain sputum culture to assess for other bronchiectasis bacterial pathogens.  There are standing orders in Epic.

## 2017-12-20 MED FILL — METFORMIN HCL/ER 500MG/TAB: METFORMIN HCL/ER 500MG/TAB | 90 days supply | Qty: 180 | Fill #2

## 2017-12-20 NOTE — Unmapped (Signed)
Received a call from patient's spouse stating they would like to have the sputum lab orders faxed to [facility], fax #: [phone].  One order released for LR and one for AFB, faxed to [facility] as requested by patient.  [facility] LPN

## 2017-12-23 MED ORDER — FLUTICASONE PROPIONATE 230 MCG-SALMETEROL 21 MCG/ACTUATION HFA INHALER
Freq: Two times a day (BID) | RESPIRATORY_TRACT | 11 refills | 0.00000 days | Status: CP
Start: 2017-12-23 — End: 2017-12-23

## 2017-12-23 MED ORDER — FLUTICASONE PROPIONATE 230 MCG-SALMETEROL 21 MCG/ACTUATION HFA INHALER: 2 | g | 11 refills | 0 days | Status: AC

## 2017-12-23 NOTE — Unmapped (Signed)
Due to insurance formulary coverage, changing Dulera to Advair HFA, which he previously tolerated.

## 2017-12-23 NOTE — Unmapped (Signed)
-----   Message from Norvel Richards, MD sent at 12/23/2017  2:29 PM EST -----  Regarding: FW: Medication Not Covered  Checking with you since I think you've been prescribing this.  Thanks!  Edson Snowball  ----- Message -----  From: Jasper Loser  Sent: 12/23/2017  11:13 AM  To: Norvel Richards, MD  Subject: Medication Not Covered                           Hello Dr. Rosita Fire,    This patient contacted our pharmacy for a refill for [facility], the medication is not covered under the patient's insurance. The preferred medications are Advair, Serevent, Spiriva, Breo,Arnuity, and Flovent. When you get a chance, can you send a new prescription to the pharmacy so that we can get this medication sent out to the patient?    Thanks,  Sunny Schlein

## 2017-12-23 NOTE — Unmapped (Signed)
Patient called stating a paper is needed explaining why the patient would not be able to attend Jury duty. They would like a phone call back in reference to this matter. Thanks

## 2017-12-26 MED FILL — ADVAIR HFA/230/21/AER: ADVAIR HFA/230/21/AER | 30 days supply | Qty: 1 | Fill #0

## 2017-12-31 NOTE — Unmapped (Signed)
I completed this letter and will bring over to the clinic this afternoon.  Bea can you contact him and see if he wants [place] to send it to him or somewhere else?  Thanks!

## 2018-01-03 NOTE — Unmapped (Signed)
Contacted Patient Friday. Patient requested to be mailed.  I mailed and scanned to chart

## 2018-01-11 ENCOUNTER — Encounter: Admit: 2018-01-11 | Discharge: 2018-01-11 | Payer: PRIVATE HEALTH INSURANCE

## 2018-01-11 ENCOUNTER — Ambulatory Visit: Admit: 2018-01-11 | Discharge: 2018-01-11 | Payer: PRIVATE HEALTH INSURANCE

## 2018-01-11 DIAGNOSIS — J320 Chronic maxillary sinusitis: Secondary | ICD-10-CM

## 2018-01-11 DIAGNOSIS — Z0000 Encounter for general adult medical examination without abnormal findings: Principal | ICD-10-CM

## 2018-01-11 DIAGNOSIS — J479 Bronchiectasis, uncomplicated: Secondary | ICD-10-CM

## 2018-01-11 DIAGNOSIS — H43393 Other vitreous opacities, bilateral: Secondary | ICD-10-CM

## 2018-01-11 DIAGNOSIS — E119 Type 2 diabetes mellitus without complications: Principal | ICD-10-CM

## 2018-01-11 DIAGNOSIS — H04123 Dry eye syndrome of bilateral lacrimal glands: Secondary | ICD-10-CM

## 2018-01-11 DIAGNOSIS — R1011 Right upper quadrant pain: Secondary | ICD-10-CM

## 2018-01-11 DIAGNOSIS — K760 Fatty (change of) liver, not elsewhere classified: Secondary | ICD-10-CM

## 2018-01-11 DIAGNOSIS — H53032 Strabismic amblyopia, left eye: Secondary | ICD-10-CM

## 2018-01-11 DIAGNOSIS — H2513 Age-related nuclear cataract, bilateral: Secondary | ICD-10-CM

## 2018-01-11 MED ORDER — AMOXICILLIN 875 MG-POTASSIUM CLAVULANATE 125 MG TABLET
ORAL_TABLET | Freq: Two times a day (BID) | ORAL | 0 refills | 0.00000 days | Status: CP
Start: 2018-01-11 — End: 2018-01-11

## 2018-01-11 MED ORDER — AMOXICILLIN 875 MG-POTASSIUM CLAVULANATE 125 MG TABLET: 1 | tablet | 0 refills | 0 days

## 2018-01-11 MED FILL — AMOXICILLIN/CLAVULANATE P/875/125MG/TABS: AMOXICILLIN/CLAVULANATE P/875/125MG/TABS | 14 days supply | Qty: 28 | Fill #0

## 2018-01-11 NOTE — Unmapped (Signed)
I recommend increasing fruits and vegetables.  A goal is to have half your plate filled with veggies  I would like to have you see the liver doctor. I asked the front desk to help you set that up.  Labwork today to recheck your liver.  Look over the advance directive paperwork and fill out. Once it's finished it will need to be notarized. We would like a copy to put on your chart.     Antibiotic twice a day for 14 days  Increase saline nasal rinses  I referred you back to the ear nose and throat doctors      MEDICARE SCREENING & PREVENTION GUIDELINES RECOMMENDATIONS NEXT DATE DUE    AAA Ultrasound  (men) Men ages 45-75 and history of smoking. NA   DEXA Bone Density Measurement  (women)   One time screen for age 67 or older. NA   Mammogram (women) Age 105 to 53 every 2 years or as recommended by most recent mammogram result. NA   Diabetes Screening Screening: fasting glucose or hemoglobin A1c every 3 years Due 02/21/18   Heart Disease (Total cholesterol, LDL, and HDL)   Every 5 years until age 37 if no apparent signs or symptoms of heart disease. No need to screen if already taking a statin. 11/2018         Aspirin    Ages 62-69 with ASCVD risk 10% or greater and benefit outweighs risk   NA   HIV Screening Ages 15-65: One time screening. Negative   Hepatitis C Screening Screen once if date of birth is between [birth year] NA   Colorectal Cancer Age 33 to 31 stool cards yearly OR colonoscopy every 10 years (or more often if high risk per last result). 03/2020   Obesity   All men and women; nutritionist referral covered if BMI 30 or greater Today. Body mass index is 32.36 kg/m??.     Alcohol Use Screening Screening yearly NA   Tobacco Use Screening Screen at every visit NA     Depression Screening Screening yearly 01/2019   Influenza Vaccine Yearly Fall 2020   Pneumococcal Vaccines    Pneumovax 23, PPSV23    Prevnar 13, PCV13 Adults age 20 or older (immunocompetent): PCV13 followed by PPSV23 at least 1 year later. If PPSV23 given first, give PCV13 at least 1 year later. Age 72     Shingles Vaccine Shingrix recommended age 29 years and older: 2 doses 2 to 6 months apart.     Covered by Medicare Part D At pharmacy   Tetanus Vaccines One dose of   Tdap in lifetime. Td   booster every   10 years (Medicare does not cover for prevention).  2020

## 2018-01-11 NOTE — Unmapped (Signed)
1) The patient has diabetes without any evidence of retinopathy.  The patient was advised to maintain tight glucose control, tight blood pressure control, and favorable levels of cholesterol.   2) Amblyopia/strabimus OS- refracts to 20/20/. New rx provided.  3) DES- lubricate  4) syneresis OU- stable.  4) cataract- not visually significant. Observe.       Follow up in one year was recommended.    Cc Darnelle Spangle, MD

## 2018-01-11 NOTE — Unmapped (Signed)
Annual Wellness Visit:    ASSESSMENT/PLAN TO ADDRESS SCREENING, RISK FACTORS, AND PERSONAL PREVENTION PLAN:    Follow-up for PCP (patient preferred to discuss with PCP first):  -- Advanced directives (patient given 5 wishes today)  -- Recommend dietitian-he declined. Counseled about healthy eating.    Orders Placed today:  Orders Placed This Encounter   Procedures   ??? Lower Respiratory Culture   ??? Hepatic Function Panel     Standing Status:   Future     Standing Expiration Date:   01/12/2019   ??? Ambulatory referral to ENT     Standing Status:   Future     Standing Expiration Date:   01/12/2019     Referral Priority:   Routine     Referral Type:   Generic Referral     Referral Reason:   Specialty Services Required     Number of Visits Requested:   3       Personalized Prevention Plan: A personalized prevention plan was reviewed with the patient, appropriate referrals were made, and the patient was given a copy for their personal records as a part of the After Visit Summary available in Epic.       HEALTH RISK SCREENING, ASSESSMENT/PLAN:  1. Tobacco Use Screen: No concerns identified based on screening    2. Unhealthy Alcohol Use Screen:   Do you sometimes drink beer, wine, or other alcoholic beverages? (If No or Patient Declines, choose ???Not Applicable??? for next 2 questions)  No    How many times in the past year have you had 4 or more drinks in a day?   Not Applicable    If 1 or more, did you give the patient the AUDIT?  Not Applicable      No concerns identified based on screening    3. Advanced Care Planning:  Patient identified they would like information on advanced care planning and was given the MOST form and/or Five Wishes form.  Patient was informed that a future visit can be made to discuss this information further   He identifies that he would want his wife Sande Brothers to be HCPOA. He would want full resuscitation and life support. He says he would want to be unhooked from life support if there was no chance he would wake up.  See advance care planning note.    4. General Health: Poor   Mouth and teeth condition: Good     5. Functional Ability/Safety Screen:      > Home Safety Evaluation: No concerns identified based on screening          > Functional Evaluation and ADL Assessment:: Multiple medical problems impacting his walking, pushing large objects, and IADLS. Working to manage medical issues.         >During the last 3 months have you leaked urine (even a small amount)????: No        Incontinence: never        > Physical Activity in the past 7 days: 7 days   Duration: 30 minutes per day   Intensity: Light   Plan: Pt advised to exercise 30 minutes most days of the week            Tries to walk on the [employer] as much as he can.          > Fall risk:   Has the patient fallen within the past year? no  Does patient feel unsteady when standing or walking?  no   No concerns identified based on screening and Get Up And Go score of <12 seconds                > Hearing Assessment: Hearing HHIE-S score:  4; No concerns identified based on screening, HHIE-S score 0 to 8             6. Cognition screen: 6-Item Cognitive Screener: Have patient repeat the words APPLE, TABLE, PENNY then ask the following.  What year is this?  What month is this?  What is the day of the week?  What are the three items I asked you to remember?  Result: Score:4/6; Some problems with recall. Will do MOCA at next visit.      7. Nutrition: Patient declines Dietician referral. Patient encouraged to consider Dietitian visit in the future.,       8. HIV Risk Screen: up-to-date on screening    9. Hepatitis C Risk Screen: Was the patient born between the years 1945-1965?  no No concerns for exposure identified based on date of birth    13. Depression screen: PHQ-9 score: 3; Minimal (score 1-4), No concerns identified based on screening       11. The 10-year ASCVD risk score Denman George DC Montez Hageman., et al., 2013) is: 8.2%    Values used to calculate the score:      Age: 52 years      Sex: Male      Is Non-Hispanic African American: No      Diabetic: Yes      Tobacco smoker: No      Systolic Blood Pressure: 129 mmHg      Is BP treated: No      HDL Cholesterol: 48 mg/dL      Total Cholesterol: 196 mg/dL    Note: For patients with SBP <90 or >200, Total Cholesterol <130 or >320, HDL <20 or >100 which are outside of the allowable range, the calculator will use these upper or lower values to calculate the patient???s risk score.      12. Chronic Care Management Services:    Patient was informed of services. Explained the right to revoke/only one provider/given care plan. Patient signed consent: No      REASON FOR VISIT: Annual Wellness Visit    LIST OF CURRENT PROVIDERS:  Patient Care Team:  Norvel Richards, MD as PCP - General  Norvel Richards, MD as PCP - General-ATTRIBUTED  Truett Mainland, MD as Consulting Physician (Pulmonary Disease)  Teresa Pelton, MD (Inactive) as Resident (Infectious Diseases)      ALLERGIES: Reviewed and Updated in EPIC:   Allergies   Allergen Reactions   ??? Codeine Shortness Of Breath and Rash   ??? Rifampin      Fevers, chills.   ??? Daliresp [Roflumilast] Other (See Comments)     Back pain     MEDICATIONS: Reviewed and Updated in EPIC      PAST MEDICAL / SURGICAL HISTORY:  Active Ambulatory Problems     Diagnosis Date Noted   ??? Allergic rhinitis 08/05/2010   ??? Alpha-1-antitrypsin deficiency carrier (CMS-HCC) 11/24/2010   ??? Benign neoplasm of colon 12/10/2011   ??? Bronchiectasis (CMS-HCC) 03/31/2011   ??? Hyperlipidemia 08/05/2010   ??? Obstructive sleep apnea syndrome 08/05/2010   ??? Chronic sinusitis 08/05/2010   ??? Vitamin D deficiency 08/05/2010   ??? Type 2 diabetes mellitus without complication, without long-term current use of insulin (CMS-HCC) 03/14/2013   ???  Uncomplicated severe persistent asthma 03/14/2013   ??? Obesity, Class I, BMI 30-34.9 03/14/2013   ??? Migraine with aura and without status migrainosus, not intractable 09/27/2013   ??? Gastroesophageal reflux disease without esophagitis 04/10/2014   ??? Flexural eczema 01/04/2015   ??? Mycobacterium avium-intracellulare infection (CMS-HCC) - colonization 03/01/2015   ??? Anxiety 12/06/2015   ??? Fatty liver 11/23/2017     Resolved Ambulatory Problems     Diagnosis Date Noted   ??? Abnormal glucose tolerance test 03/31/2011   ??? Cryptococcus pneumonia 02/07/2013   ??? Pulmonary cryptococcosis (CMS-HCC)    ??? Cholelithiases 09/27/2013   ??? Need for immunization against influenza 07/20/2014   ??? Health care maintenance 01/07/2015   ??? Diarrhea of presumed infectious origin 10/29/2015     Past Medical History:   Diagnosis Date   ??? Allergic rhinitis    ??? Alpha-1-antitrypsin deficiency carrier (CMS-HCC)    ??? Amblyopia    ??? Asthma    ??? Asthma, extrinsic    ??? Bronchiectasis (CMS-HCC) 06/12/2011   ??? Chronic sinusitis    ??? Colon polyps    ??? Diabetes mellitus (CMS-HCC)    ??? GERD (gastroesophageal reflux disease)    ??? Impaired glucose tolerance    ??? Obesity    ??? Pneumonia    ??? Pseudomonas respiratory infection    ??? 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 Sleep apnea    ??? Strabismus    ??? Strep throat          FAMILY HISTORY (Pertinent):    Family History   Problem Relation Age of Onset   ??? Colon cancer Father    ??? Emphysema Maternal Uncle    ??? Heart disease Maternal Uncle    ??? Diabetes Paternal Grandfather           SOCIAL HISTORY / SCREENINGS:  see screening and A/P above      PHYSICAL EXAM:  Vital Signs: BP 129/70  - Pulse 74  - Ht 166.9 cm (5' 5.71)  - Wt 90.1 kg (198 lb 11.2 oz)  - BMI 32.36 kg/m??   Musculoskeletal: see above screening A/P  Psychiatric: see above screening A/P

## 2018-01-11 NOTE — Unmapped (Signed)
Assessment and Plan    1. Medicare annual wellness visit, subsequent    2. Fatty liver    3. Bronchiectasis without complication (CMS-HCC)    4. RUQ discomfort    5. Chronic maxillary sinusitis        #1. Fatty liver/RUQ discomfort. Recheck LFTs today. I have referred him to hepatology and he has not yet heard from them. Asked front desk to help facilitate. Concern as to whether alpha-1-antitripsin is contributing to element of fatty liver dz. Also would like to restart statin for CV primary risk reduction but will appreciate their advice. Counseled about healthy eating to decrease fatty liver dz.     #2. Sinusitis. Copious drainage, facial pain. Worsening and not improving. Start augmentin bid x 14 days. Increase nasal saline rinses.  Referred back to ENT who he hasn't seen in some time.    #3. Bronchiectasis. Following closely w/pulm. Is coughing of more phlegm than prior. May be coming from sinuses based on his description. He will try to give a sputum sample today which he was supposed to do at his last pulm visit. Unfortunately quantity was insufficient.    #4. Nutrition/DM. Discussed recommended healthy eating. I recommended seeing nutritionist. He declined.    Orders Placed This Encounter   Procedures   ??? Lower Respiratory Culture   ??? Hepatic Function Panel   ??? Ambulatory referral to ENT         Follow up: Return in about 4 weeks (around 02/08/2018).        Reason for visit: medicare wellness  , sinus congestion, cough, RUQ discomfort    Brett Bowens Sr. is a 52 y.o. male with idiopathic bronchiectasis, DM2, obesity who presents for follow up.    HPI  Took 2 weeks of the antibiotic.  IT helped the wheezing and tightness.  Wheezing and coughing more.  Did a sputum sample a few weeks ago.    Feels like RUQ is uncomfortable. Started yesterday, tight today. Felt better when he was on the abx. Now that off the abx feels really tight again.    Mother's mother had leukemia. He wonders if that's something he could have.    He eats chicken, vegetables. Doesn't eat much fats.  Does eat sandwiches. Eats a lot of variety of food. Doesn't eat biscuits. Has lost weight. He doesn't understand why his liver is fatty. He started a weight loss drink it's a mixture that he puts in his drink. Pre-plex. Went to yogurt type ice cream. Ate hamburger one time last week. Has been mostly eating chicken and fish. Baked tilapia.     Eating grapes.     Feels like his liver is more enlarged.    Use flonase. Does use the saline nasal saline sometimes but feels like it makes him worse if he does too much.    Had a bad coughing attack the other night. Send a pain down his neck. He feels like his muscles are hurting from coughing.    Medications were reviewed    Physical Exam  Gen: coughing, no respiratory distress  Vitals:    01/11/18 0824   BP: 129/70   Pulse: 74   Weight: 90.1 kg (198 lb 11.2 oz)   Height: 166.9 cm (5' 5.71)   HEENT: TMs nl, nares boggy with purulent drainage, OP with drainage. No cervical LAD  CV: RRR, no mrg  Pulm: course bs bilat  GI: distended. + bs. S, NT, ND + bs  Ext: no  edema  Skin: warm and dry    Wt Readings from Last 6 Encounters:   01/14/18 89.9 kg (198 lb 3.2 oz)   01/11/18 90.1 kg (198 lb 11.2 oz)   11/23/17 91.4 kg (201 lb 8 oz)   11/23/17 91.2 kg (201 lb)   07/02/17 90.8 kg (200 lb 1.6 oz)   06/29/17 91.2 kg (201 lb 1.6 oz)       Reviewed last pulm note  Reviewed past sputum samples

## 2018-01-13 NOTE — Unmapped (Signed)
Called Patient back to advise paperwork went out on Monday.  Left message that Person that called was not on his contact list so I could not fax to that person with his permission.  I will remail or he can call with fax number

## 2018-01-14 ENCOUNTER — Encounter
Admit: 2018-01-14 | Discharge: 2018-01-14 | Payer: PRIVATE HEALTH INSURANCE | Attending: Student in an Organized Health Care Education/Training Program | Primary: Student in an Organized Health Care Education/Training Program

## 2018-01-14 ENCOUNTER — Encounter: Admit: 2018-01-14 | Discharge: 2018-01-14 | Payer: PRIVATE HEALTH INSURANCE

## 2018-01-14 DIAGNOSIS — J310 Chronic rhinitis: Secondary | ICD-10-CM

## 2018-01-14 DIAGNOSIS — J320 Chronic maxillary sinusitis: Principal | ICD-10-CM

## 2018-01-14 DIAGNOSIS — G43109 Migraine with aura, not intractable, without status migrainosus: Secondary | ICD-10-CM

## 2018-01-14 DIAGNOSIS — Z148 Genetic carrier of other disease: Secondary | ICD-10-CM

## 2018-01-14 DIAGNOSIS — E669 Obesity, unspecified: Secondary | ICD-10-CM

## 2018-01-14 DIAGNOSIS — R1011 Right upper quadrant pain: Secondary | ICD-10-CM

## 2018-01-14 DIAGNOSIS — A310 Pulmonary mycobacterial infection: Secondary | ICD-10-CM

## 2018-01-14 DIAGNOSIS — K760 Fatty (change of) liver, not elsewhere classified: Principal | ICD-10-CM

## 2018-01-14 DIAGNOSIS — E119 Type 2 diabetes mellitus without complications: Secondary | ICD-10-CM

## 2018-01-14 LAB — HEPATIC FUNCTION PANEL
ALBUMIN: 4.3 g/dL (ref 3.5–5.0)
ALT (SGPT): 33 U/L (ref 19–72)
AST (SGOT): 26 U/L (ref 19–55)
BILIRUBIN DIRECT: 0.2 mg/dL (ref 0.00–0.40)
PROTEIN TOTAL: 6.7 g/dL (ref 6.5–8.3)

## 2018-01-14 LAB — ALKALINE PHOSPHATASE: Alkaline phosphatase:CCnc:Pt:Ser/Plas:Qn:: 75

## 2018-01-14 NOTE — Unmapped (Signed)
Otolaryngology Clinic Note    Brett Bowens Sr. is a 52 y.o. male is seen in consultation at the request of Brett Hanna  for evaluation of chronic sinusitis.       History of Present Illness:     The patient is a 52 y.o. 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 who presents for the evaluation of CRS.  Patient sees Dr. Reuel Boom for bronchiectasis 2/2 alpha-1 antitrypsin carrier state with prior NTM infection and hx of OSA.     Patient reports multiply recurrent sinus infections as well as pulmonary infections.  Is currently on a course of Augmentin for 14 days.  He started this on Tuesday.    Warts chronic nasal obstruction that is bilateral in nature.  This makes wearing his CPAP difficult however he does wear it every night.  Reports in general the settings are high but he does not know what the numbers are.  Report he frequently wakes up with a completely dry mouth.  Sinus standpoint his wife reports his sense of smell is generally intact.  Thick chronic drainage from his nose.  This is usually clear but during exacerbations can become cloudy.  Reports diffuse facial pressure.  He has never had sinus surgery.  He has never had significant facial trauma.  He is currently on disability but [place].    Patient reports is been unable to tolerate a Neomed bottle.  He reports he has lots of ear pressure following this and gets frequent ear infections.    Patients medical records were personally reviewed.      A 12 point review of systems was negative except as indicated.  The patient denies fevers, chills, shortness of breath, chest pain, nausea, vomiting, diarrhea, inability to lie flat, dysphagia, odynophagia, hemoptysis, hematemesis, changes in vision, changes in voice quality, otalgia, otorrhea, vertiginous symptoms, focal deficits, or other concerning symptoms.    Past Medical History     has a past medical history of Allergic rhinitis; Alpha-1-antitrypsin deficiency carrier (CMS-HCC); Amblyopia; Asthma; Asthma, extrinsic; Bronchiectasis (CMS-HCC) (06/12/2011); Chronic sinusitis; Colon polyps; Diabetes mellitus (CMS-HCC); GERD (gastroesophageal reflux disease); Impaired glucose tolerance; Obesity; Pneumonia; Pseudomonas respiratory infection; Pulmonary cryptococcosis (CMS-HCC) (01/2013); Sleep apnea; Strabismus; and Strep throat.    Past Surgical History     has a past surgical history that includes Bronchoscopy; Lung biopsy; pr bronchoscopy,diagnostic w lavage (Bilateral, 08/21/2013); pr bronchoscopy,diagnostic w brush (Bilateral, 08/21/2013); Shoulder surgery (Right); and pr colonoscopy flx dx w/collj spec when pfrmd (N/A, 03/21/2015).    Current Medications    Current Outpatient Prescriptions   Medication Sig Dispense Refill   ??? acetaminophen (TYLENOL EXTRA STRENGTH) 500 MG tablet Take 1 tablet by mouth as needed for pain     ??? albuterol (PROVENTIL HFA;VENTOLIN HFA) 90 mcg/actuation inhaler Inhale 2 puffs every four (4) hours as needed for wheezing. 1 Inhaler 11   ??? albuterol 2.5 mg /3 mL (0.083 %) nebulizer solution Inhale 3 mL (2.5 mg total) by nebulization Two (2) times a day. And every 6 hours as needed for SOB/Wheezing. 540 mL 11   ??? alcohol swabs PadM Use as directed with inhaled antibiotics 100 each 5   ??? amoxicillin-clavulanate (AUGMENTIN) 875-125 mg per tablet Take 1  tablet by mouth Two (2) times a day. for 14 days 28 tablet 0   ??? aztreonam lysine (CAYSTON) 75 mg/mL Nebu nebulization solution Inhale 1 mL (75 mg total) Three (3) times a day. 28 days on and 28 days off. 84 mL 5   ??? cetirizine (ZYRTEC) 10 MG tablet Take 1 tablet (10 mg total) by mouth daily. 90 tablet 3   ??? cholecalciferol, vitamin D3, (VITAMIN D3) 2,000 unit cap Take 1 capsule (2,000 Units total) by mouth daily. 30 capsule prn   ??? colistimethate (COLYMYCIN) 150 mg injection Inhale 2 mL (150 mg total) Two (2) times a day. Inject 2mL SWFI into colistin vial, then withdraw 2mL (150mg ) for dose. 60 vial 5   ??? fexofenadine (ALLEGRA) 180 MG tablet Take 1 tablet (180 mg total) by mouth daily. 90 tablet 3   ??? fluticasone (FLONASE) 50 mcg/actuation nasal spray USE 2 SPRAYS IN EACH NOSTRIL DAILY 16 g 5   ??? fluticasone-salmeterol (ADVAIR HFA) 230-21 mcg/actuation inhaler Inhale 2 puffs Two (2) times a day. 1 Inhaler 11   ??? metFORMIN (GLUCOPHAGE-XR) 500 MG 24 hr tablet Take 2 tablets (1,000 mg total) by mouth every morning before breakfast. 180 tablet 3   ??? MISCELLANEOUS MEDICAL SUPPLY MISC Frequency:PHARMDIR   Dosage:0.0     Instructions:  Note:CPAP 15 cm H20 with heated humidity for nasal dryness.  Mask: ResMed Quattro size Medium. Dx OSA. Dose: 1     ??? montelukast (SINGULAIR) 10 mg tablet Take 1 tablet (10 mg total) by mouth nightly. 90 tablet 3   ??? nebulizer accessories Kit Please dispense 1 Pari LC Plus nebulizer cup kit to be used with nebulized medications. 1 kit 11   ??? nebulizer and compressor (VIOS AEROSOL DELIVERY SYSTEM) Devi Please dispense one Pari Vios Pro and Pari LC Plus neb cup to be used with inhaled medications. 1 each 0   ??? nystatin (MYCOSTATIN) 100,000 unit/mL suspension Take 5 mL by mouth.     ??? omeprazole (PRILOSEC) 20 MG capsule Take 1 capsule (20 mg total) by mouth Two (2) times a day. (Patient taking differently: 2 tablets by mouth every morning) 180 capsule 3   ??? PARI LC D NEBULIZER Misc Provide 1 device  for use with inhaled medication. 1 each 5   ??? rizatriptan (MAXALT) 10 MG tablet Take one tablet at onset of migraine.  May repeat in  2 hours. 10 tablet 12   ??? sertraline (ZOLOFT) 50 MG tablet Take 1 tablet (50 mg total) by mouth daily. 90 tablet 3   ??? sodium chloride (NS) Soln Use 2mL NaCl 0.9% to mix colistin vial, Add extra 1mL NaCl into neb cup along with mixed colistin,total volume 3mL. 600 mL 5   ??? sodium chloride 7% 7 % Nebu INHALE 3 ML VIA NEBUZLIER TWICE DAILY 540 mL PRN   ??? sterile water, PF, Soln Add 1mL SWFI into neb cup along with colistin. 60 mL 5   ??? syringe with needle (SYRINGE 3CC/20GX1) 3 mL 20 gauge x 1 Syrg For use with inhaled antibiotic 60 Syringe 5   ??? topiramate (TOPAMAX) 100 MG tablet Take 1 tablet (100 mg total) by mouth nightly. 90 tablet 3     No current facility-administered medications for this visit.        Allergies    Allergies   Allergen Reactions   ??? Codeine Shortness Of Breath and Rash   ??? Rifampin      Fevers, chills.   ??? Daliresp [Roflumilast] Other (  See Comments)     Back pain       Family History    Negative for bleeding disorders or free bleeding.     family history includes Colon cancer in his father; Diabetes in his paternal grandfather; Emphysema in his maternal uncle; Heart disease in his maternal uncle.    Social History:     reports that he has never smoked. He has quit using smokeless tobacco.   reports that he does not drink alcohol.   reports that he does not use drugs.    Review of Systems    A 12 system review of systems was performed and is negative other than that noted in the history of present illness.    Vital Signs  There were no vitals taken for this visit.    RSDI today was 3.      Physical Exam    General: Well-developed, well-nourished. Appropriate, comfortable, and in no apparent distress.  Head/Face: On external examination there is no obvious asymmetry or scars. On palpation there is no tenderness over maxillary sinuses or masses within the salivary glands. Cranial nerves V and VII are intact through all distributions.  Eyes: PERRL, EOMI, the conjunctiva are not injected and sclera is non-icteric.  Ears: On external exam, there is no obvious lesions or asymmetry. The EACs are bilaterally without cerumen or lesions. The TMs are in the neutral position and are mobile to pneumatic otoscopy bilaterally. There are no middle ear masses or fluid noted. Hearing is grossly intact bilaterally.  Nose: On external exam there are neither lesions nor asymmetry of the nasal tip/ dorsum. On anterior rhinoscopy, visualization posteriorly is limited on anterior examination. For this reason, to adequately evaluate posteriorly for masses, polypoid disease and/or signs of infections, nasal endoscopy is indicated (see procedure below).  Oral cavity/oropharynx: The mucosa of the lips, gums, hard and soft palate, posterior pharyngeal wall, tongue, floor of mouth, and buccal region are without masses or lesions and are normally hydrated. Good dentition. Tongue protrudes midline. Tonsils are normal appearing. Supraglottis not visualized due to gag reflex.  Neck: There is no asymmetry or masses. Trachea is midline. There is no enlargement of the thyroid or palpable thyroid nodules.   Lymphatics: There is no palpable lymphadenopathy along the jugulodiagastric, submental, or posterior cervical chains.  Chest: No audible wheeze, unlabored respirations.  Cardiovascular: Regular rate.  GI: Nondistended.  Neurologic: Cranial nerve???s II-XII are grossly intact. Exam is non-focal.  Extremities: No cyanosis, clubbing or edema.    Procedures:    Sinonasal Endoscopy (CPT [id number]): To better evaluate the patient???s symptoms, sinonasal endoscopy is indicated.  After discussion of risks and benefits, and topical decongestion and anesthesia, an endoscope was used to perform nasal endoscopy on each side. A time out identifying the patient, the procedure, the location of the procedure and any concerns was performed prior to beginning the procedure.    Findings:   Copious clear mucus.  There is a long septal shelf along the inferior turbinate on the left.  Mucosa significantly inflamed.  There is no purulence. No polyps or pus.      Examination on the right reveals a broad anterior septal deviation to the right.  This obstructs visualization of the middle turbinate.  Mucosa severely inflamed.  There is copious clear mucus.  There is no mucopurulence or no polyps or pus.      Oretha Ellis Nasal Endoscopy Score    Left        ?? Polyps:  Absent (0)   ?? Edema:   Severe (2)   ?? Discharge:  Clear, Thin (1)    ?? Scarring:  Absent (0)   ?? Crusting:  None (0)      Total Left:  3     Right         ?? Polyps:  Absent (0)   ?? Edema:  Severe (2)   ?? Discharge: Clear, Thin (1)    ?? Scarring:  Absent (0)   ?? Crusting:  None (0)      Total Right:   3      Labs and Diagnostic Tests  An MRI was reviewed from 2015 that was obtained for other reasons.  This demonstrates a broad right-sided septal deviation.  No substantial sinus disease was noted on this limited study.      Assessment:  The patient is a 52 y.o. 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 who presents for the evaluation of: Nasal obstruction chronic sinusitis, septal deviation, instructive sleep apnea    Recommendations:  1. [name] has inflammation in the nasal cavity noted on nasal endoscopy exam today in clinic. We have extensively discussed treatment options. The patient will start on a nasal hygiene regimen which includes BID isotoninc nasal irrigation and nasal steroid sprays (2 sprays BID each nostril). We have discussed the proper positioning for optimal use of the nasal steroid. Follow-up will be scheduled in 3-4 months to assess the response to medical therapy. Should medical therapy fail we plan to discuss other treatment options.  And previous poor experience with a Neomed rinse bottle we will try using a Nettie pot.    2.  See him back May 7 with a CT scan on the same day as his appointment with Dr. Garner Nash as well.    3.  Could continue consider mometasone rinses if he is able to tolerate the Neomed/Nettie pot.       The patient's physical examination findings including flexible fiberoptic nasopharyngolaryngosocpy/sinonasal endoscopy were thoroughly discussed.    The patient voiced complete understanding of plan as detailed above and is in full agreement.

## 2018-01-14 NOTE — Unmapped (Signed)
NEIL MED SINUS RINSE     Dr. Ralene Ok has recommended that you use the Lloyd Huger Med sinus rinse, which is a way to clean our your nose and sinuses. This will help alleviate your nasal and sinus symptoms caused by allergies, infection, or surgery.   To use, fill the bottle with distilled water (do not use tap water or well water) and add the saline packet. Lukewarm or body temperature water is best. The bottle we give you has one packet with it, so you will need to buy additional packets.   Lean your head forward over the sink and gently squeeze the bottle into one side of your nose. Use half of the bottle in each side of your nose. The rinse should go into one side of your nose and come out of the other side. Some people like to do the rinses in the shower.   Wait about 10 to 15 minutes for the nasal draining to stop before using any prescribed nasal sprays.     [facility]    BUFFERED ISOTONIC SALINE NASAL IRRIGATION    The Benefits:    1. When you irrigate, the isotonic saline (salt water) acts as a solvent and washes the mucus crusts and other debris from your nose.    2. This decongests and improves the airflow into your nose.  The sinus passages begin to open.    3. Studies have also shown that a salt water and an alkaline (baking soda) irrigation solution improves nasal membrane cell function (mucociliary flow of mucus debris).    The Instructions:  The easiest way to perform these irrigations is with a NeilMed Sinus Rinse Bottle.  This is available at nearly all major drug stores and grocery stores.  You will need to purchase the bottle as well as the premixed packets.    You should plan to irrigate your nose with buffered isotonic saline 2 times per day.  Many people prefer to warm the solution slightly in the microwave - but be sure that the solution is NOT HOT.  Stand over the sink (some do this in the shower) and squirt the solution into each side of your nose, keeping your mouth open. This allows you to spit the saltwater out of your mouth.  It will not harm you if you swallow a [place].    If you have been told to use a nasal steroid such as Flonase, Nasonex, or Nasacort, you should always use isortonic saline solution first, then use your nasal steroid product.  The nasal steroid is much more effective when sprayed onto clean nasal membranes and the steroid medicine will reach deeper into the nose.    Most people experience a Miler burning sensation the first few times they use a isotonic saline solution, but this usually goes away within a few days.      Contact Information:  You saw Egbert Garibaldi, MD/PhD for your visit today    For appointments call [phone] OPT 2    To speak to a nurse call Rayvon Char  Phone: [phone]    To schedule surgery contact Maryann Alar:  Phone: [phone]  email: [email or web address]     After hours, please call [phone] and ask for the ENT physician on call.    Main phone tree:?? [phone]  Referral line:?? [phone]  Triage M-F: 8:15-4:00pm [phone]  Office fax #:?? [phone]  Velora Heckler (office manager):?? [phone]

## 2018-01-15 NOTE — Unmapped (Signed)
ADVANCE CARE PLANNING NOTE    Discussion Date:  January 11, 2018    Patient has decisional capacity:  Yes    Patient has selected a Health Care Decision-Maker if loses capacity: Yes  Name:  Brett Hanna (wife)   Contact Information:   [phone] ?? [phone]           Basis of health care decision-maker's authority?: stated patient preference   [Please update this selection at each admission]    Discussion Participants:  Clayborn Heron MD    Communication of Medical Status/Prognosis:   Pt with multiple chronic medical problems.  Currently stable.     Communication of Treatment Goals/Options:   He has a goal to be able to pay off his funeral before he dies. He has been looking at information on funerals.    He would want his wife to be his HCPOA.    He has great faith in God in relation to death and dying.  He is ok with being resuscitated. He would not want to be kept alive however if there was no chance he would wake up.    Treatment Decisions:   Provided blank advance directive. Talked about filling it out, talking to family about goals and preferences, getting it notarized, and when complete bringing in to put a copy on his chart.    I spent between 16-45 minutes providing voluntary advance care planning services for this patient.

## 2018-01-18 NOTE — Unmapped (Signed)
[facility] Refill Coordination Note: Pulmonary         Brett Bowens Sr., DOB: 30-Oct-1966  Address: [address]  [place] [address]  All above HIPAA information was verified with patient.     Medication Adherence    Patient Reported X Missed Doses in the Last Month:  0  Specialty Medication:  COLISTIN KIT   Patient is on additional specialty medications:  No  Informant:  patient  Support Network for Adherence:  family member  Confirmed Plan for Next Specialty Medication Refill:  delivery by pharmacy  Refills Needed for Supportive Medications:  not needed  Medication Assistance Program  Refill Coordination  Has the Patients' Contact Information Changed:  No    Is the Shipping Address Different:  No    Shipping Information  Delivery Scheduled:  Yes  Delivery Date:  01/28/18  Medications to be Shipped:      MEDICATIONS    -Albuterol 2.5mg /11mL nebulized solution  -Inhaled colistin 150mg  and kit: sodium chloride 0.9%, syringes with needles, Pari nebulizer cup, alcohol swabs  -Advair HFA 230/21  -Flonase  -Montelukast 10mg                    -Confirmed the medication and dosage are correct and have not changed: Yes, regimen is correct and unchanged.  Is this medicine covered by Medicare Part B? No.           ADDITIONAL NOTES          PT REPORTS NOT RECEIVING CAYSTON         Questions for the pharmacist: No  Shipping address confirmed in [facility]. [address] [place] [address]      The patient will receive an [facility] print out for each medication shipped and additional FDA Medication Guides as required.  Patient education from [place] or Robet Leu may also be included in the shipment.        Ledora Bottcher

## 2018-01-19 NOTE — Unmapped (Signed)
Bronchiectasis Clinic Pharmacist Note: Medication Management     Spoke to Brett Bowens Sr. and his wife Brett Hanna. Had them write down information for [facility] (phone #) where [facility] gets filled. Asked them to call to schedule the refill.    Reminded them that inhaled colistin comes from [facility].      Anell Barr, PharmD, BCPS, CPP  Clinical Pharmacist Practitioner  [facility]  [facility]  Pager: [phone]      CC:   M. Rolm Baptise

## 2018-01-19 NOTE — Unmapped (Signed)
Patient would like a call regarding referral.  Please let me know if I can do anything to help you.  Thank you.

## 2018-01-19 NOTE — Unmapped (Signed)
Spoke to D[place]ne [place] at [facility] in regards to why Brett Hanna hasn't received his [place]. Per Angelica Chessman they have been trying to call him to schedule his refill since December, but have been unsuccessful. She said he just needs to call his refill in.

## 2018-01-20 NOTE — Unmapped (Signed)
Jeanine-can you call and see if there is additional information?  thanks

## 2018-01-21 NOTE — Unmapped (Signed)
Referral placed again.  Can you call them and find out what is going on?  Lorilyn Laitinen

## 2018-01-21 NOTE — Unmapped (Signed)
Referral is in Epic. Can someone from referral pool help facilitate liver clinic referral.  thanks

## 2018-01-27 MED FILL — 3ML LUER-LOK SYRINGE/21GX1/MISC: 3ML LUER-LOK SYRINGE/21GX1/MISC | 30 days supply | Qty: 60 | Fill #4

## 2018-01-27 MED FILL — MONTELUKAST SODIUM/10MG/TABS: MONTELUKAST SODIUM/10MG/TABS | 30 days supply | Qty: 30 | Fill #4

## 2018-01-27 MED FILL — ALCOHOL PADS//: ALCOHOL PADS// | 50 days supply | Qty: 1 | Fill #4

## 2018-01-27 MED FILL — FLUTICASONE/50MCG/ACT/SUSP: FLUTICASONE/50MCG/ACT/SUSP | 30 days supply | Qty: 1 | Fill #2

## 2018-01-27 MED FILL — COLISTIMETHATE/150MG/INJ: COLISTIMETHATE/150MG/INJ | 30 days supply | Qty: 60 | Fill #4

## 2018-01-27 MED FILL — ALBUTEROL SULFATE/0.083%/NEBU: ALBUTEROL SULFATE/0.083%/NEBU | 30 days supply | Qty: 4 | Fill #1

## 2018-01-27 MED FILL — ADVAIR HFA/230/21/AER: ADVAIR HFA/230/21/AER | 30 days supply | Qty: 1 | Fill #1

## 2018-01-27 MED FILL — NORMAL SALINE FLUSH/0.9%/SOLN: NORMAL SALINE FLUSH/0.9%/SOLN | 30 days supply | Qty: 60 | Fill #4

## 2018-01-27 MED FILL — PARI LC PLUS NEBULIZER SET//MISC: PARI LC PLUS NEBULIZER SET//MISC | 30 days supply | Qty: 1 | Fill #4

## 2018-01-28 ENCOUNTER — Encounter: Admit: 2018-01-28 | Discharge: 2018-01-29 | Payer: PRIVATE HEALTH INSURANCE

## 2018-01-28 DIAGNOSIS — J471 Bronchiectasis with (acute) exacerbation: Principal | ICD-10-CM

## 2018-01-28 NOTE — Unmapped (Signed)
-----   Message from Vicente Masson sent at 01/28/2018  9:02 AM EDT -----  Regarding: BLOODY SPUTUM  Contact: [phone]  Patients wife called and they are on the way to clinic to drop of Sputum (w/ blood in it per wife). She sates patient is coughing up blood and needs to see you, I went ahead and put patient if for open slot in your clinic Tuesday 02-01-18 @ 1230pm. I informed wife I would have someone reach out to them regarding this as well as results. Please call patient/wife at number attached. Thanks

## 2018-02-01 ENCOUNTER — Ambulatory Visit: Admit: 2018-02-01 | Discharge: 2018-02-01 | Payer: PRIVATE HEALTH INSURANCE

## 2018-02-01 ENCOUNTER — Encounter
Admit: 2018-02-01 | Discharge: 2018-02-01 | Payer: PRIVATE HEALTH INSURANCE | Attending: Internal Medicine | Primary: Internal Medicine

## 2018-02-01 DIAGNOSIS — J470 Bronchiectasis with acute lower respiratory infection: Secondary | ICD-10-CM

## 2018-02-01 DIAGNOSIS — R042 Hemoptysis: Principal | ICD-10-CM

## 2018-02-01 DIAGNOSIS — J479 Bronchiectasis, uncomplicated: Principal | ICD-10-CM

## 2018-02-01 MED ORDER — AMOXICILLIN 875 MG-POTASSIUM CLAVULANATE 125 MG TABLET: 1 | tablet | 0 refills | 0 days

## 2018-02-01 MED ORDER — AMOXICILLIN 875 MG-POTASSIUM CLAVULANATE 125 MG TABLET
ORAL_TABLET | Freq: Two times a day (BID) | ORAL | 0 refills | 0.00000 days | Status: CP
Start: 2018-02-01 — End: 2018-06-10

## 2018-02-01 MED FILL — AMOXICILLIN/CLAVULANATE P/875/125MG/TABS: AMOXICILLIN/CLAVULANATE P/875/125MG/TABS | 21 days supply | Qty: 42 | Fill #0

## 2018-02-01 NOTE — Unmapped (Signed)
[facility]  [facility] for Bronchiectasis Care    Assessment:      Patient:Brett Hanna. ([birth year])  Reason for visit: BrettHanna is a 52 y.o.male who returns for follow-up of bronchiectasis secondary to alpha-1 antitrypsin carrier state with prior NTM infection status post treatment and OSA who presents today after episode of self-limited hemoptysis reporting increased wheezing and dyspnea.     Plan:      Problem List Items Addressed This Visit        Respiratory    Bronchiectasis (CMS-HCC)    Relevant Medications    amoxicillin-clavulanate (AUGMENTIN) 875-125 mg per tablet    PARI LC D NEBULIZER Misc    alcohol swabs PadM    colistimethate (COLYMYCIN) 150 mg injection    sterile water, PF, Soln    syringe with needle (SYRINGE 3CC/20GX1) 3 mL 20 gauge x 1 Syrg    Other Relevant Orders    CT Chest High Resolution Wo Contrast (Completed)      Other Visit Diagnoses     Hemoptysis    -  Primary    Relevant Medications    amoxicillin-clavulanate (AUGMENTIN) 875-125 mg per tablet    Other Relevant Orders    CT Chest High Resolution Wo Contrast (Completed)      Reinforced importance of regular airway clearance.  In bronchiectasis, there is impaired mucus clearance from both the airway changes and from the underlying cause of the bronchiectasis.  This impaired clearance leads to accumulation of mucus, resulting in inflammation, accumulation of bacteria, and worsening damage to the airways.  Therefore, augmentation of airway clearance is critical. Brett Hanna was provided websites with information about bronchiectasis, social forums for patients with bronchiectasis and NTM, and airway clearance techniques    Patient did not need to meet with the respiratory therapist today. Patient should bring any new airway clearance devices to the next visit to review technique with the respiratory therapist.  No changes were made to airway clearance regimen.  No changes were made to inhaled medications. He met with Alton Revere, CPP, to review how he gets colistin and how to prepare it. He should hold airway clearance for 24 hours if he experiences more hemoptysis.    Patient spontaneously expectorated sputum that was sent for bacterial and AFB cultures. These results will help to guide antibiotic therapy in the future during exacerbations.  If he has future exacerbations, sputum should be sent for bacterial and AFB cultures. The bacterial culture should be processed like a cystic fibrosis sample given the overlapping pathogens. There are standing orders for these cultures in our system.    Ordered chest CT with 0.6 mm cuts to reassess his lung parenchyma in light of his hemoptysis.I prescribed him an additional 3 weeks of Augmentin based on prior culture data.  If he fails to improve after Augmentin or if we find something on his chest CT that needs to be investigated, we will schedule a bronchoscopy.    Brett Hanna will return to clinic in 3 months for follow-up with pre-bronchodilator spirometry and sputum cultures.  He will call or send me a message via MyChart if questions or concerns arise before this visit.      Subjective:      HPI: Brett Hanna is a 52 y.o. male who returns for follow-up of bronchiectasis secondary to alpha-1 antitrypsin carrier state with prior NTM infection status post treatment and OSA.       Since the last clinic visit  on 11/23/17:  ?? Cough is productive of light yellow, slimy sputum. Volume varies by day. This is worse - more slimy.     ?? Wheezing is present and increased from prior.  Chest tightness is present intermittently, when less productive cough. Using rescue albuterol inhaler 2-3 times per day at least (with spacer).  In addition to the three times a day nebulized (twice a day before HTS).   ?? Pleurisy is absent.  ?? He has experienced hemoptysis. On Thursday, he expectorated mucus and blood. The blood was initially dark red but became more pink in color.  ?? He is more tired and short of breath, particularly when talking.  ?? Alternates inhaled colistin and cayston monthly. Currently on Colistin this month.    In the past year:  ?? he has been treated for an exacerbation 3 times.  his last exacerbation was 2 weeks ago when he was treated by Dr. Rosita Fire with Augmentin for 14 days.  His side pain and sinuses improved on Augmentin.  ?? he has not been admitted for an exacerbation.     For airway clearance, Brett Hanna is using Vest 2 time a day, Aerobika 2 times per day and Hypertonic Saline 7% 2 times per day.  Likes the new Vest - feels like it helps him to cough up mucus more easily.  Exercise capacity is rated as good. MMRC of 3 = I stop for breath after walking about 100 yards or after a few minutes on level ground.  Brett Hanna is not on home oxygen therapy.  Has CPAP at night.    Review of Systems  No chills, fever, night sweats. Left ear fullness continued. Sinus pressure and congestion. More headaches and has been dizzy when getting up. Remainder of a complete review of systems was negative unless mentioned above.    Past Medical History:   Diagnosis Date   ??? Allergic rhinitis    ??? Alpha-1-antitrypsin deficiency carrier (CMS-HCC)     with slightly low levels   ??? Amblyopia    ??? Asthma    ??? Asthma, extrinsic    ??? Bronchiectasis (CMS-HCC) 06/12/2011   ??? Chronic sinusitis    ??? Colon polyps    ??? Diabetes mellitus (CMS-HCC)    ??? GERD (gastroesophageal reflux disease)    ??? Impaired glucose tolerance    ??? Obesity    ??? Pneumonia    ??? Pseudomonas respiratory infection    ??? 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 Sleep apnea    ??? Strabismus    ??? Strep throat        Current Outpatient Prescriptions   Medication Sig Dispense Refill   ??? acetaminophen (TYLENOL EXTRA STRENGTH) 500 MG tablet Take 1 tablet by mouth as needed for pain     ??? albuterol (PROVENTIL HFA;VENTOLIN HFA) 90 mcg/actuation inhaler Inhale 2 puffs every four (4) hours as needed for wheezing. 1 Inhaler 11   ??? albuterol 2.5 mg /3 mL (0.083 %) nebulizer solution Inhale 3 mL (2.5 mg total) by nebulization Two (2) times a day. And every 6 hours as needed for SOB/Wheezing. 540 mL 11   ??? alcohol swabs PadM Use as directed with inhaled antibiotics 100 each 5   ??? aztreonam lysine (CAYSTON) 75 mg/mL Nebu nebulization solution Inhale 1 mL (75 mg total) Three (3) times a day. 28 days on and 28 days off. 84 mL 5   ??? cetirizine (ZYRTEC) 10 MG tablet Take 1 tablet (10 mg total) by mouth daily.  90 tablet 3   ??? cholecalciferol, vitamin D3, (VITAMIN D3) 2,000 unit cap Take 1 capsule (2,000 Units total) by mouth daily. 30 capsule prn   ??? colistimethate (COLYMYCIN) 150 mg injection Inhale 2 mL (150 mg total) Two (2) times a day. Inject 2mL SWFI into colistin vial, then withdraw 2mL (150mg ) for dose. 60 vial 5   ??? fexofenadine (ALLEGRA) 180 MG tablet Take 1 tablet (180 mg total) by mouth daily. 90 tablet 3   ??? fluticasone (FLONASE) 50 mcg/actuation nasal spray USE 2 SPRAYS IN EACH NOSTRIL DAILY (Patient taking differently: USE 2 SPRAYS IN EACH DAILY AS NEEDED FOR RHINITIS) 16 g 5   ??? metFORMIN (GLUCOPHAGE-XR) 500 MG 24 hr tablet Take 2 tablets (1,000 mg total) by mouth every morning before breakfast. 180 tablet 3   ??? MISCELLANEOUS MEDICAL SUPPLY MISC Frequency:PHARMDIR   Dosage:0.0     Instructions:  Note:CPAP 15 cm H20 with heated humidity for nasal dryness.  Mask: ResMed Quattro size Medium. Dx OSA. Dose: 1     ??? montelukast (SINGULAIR) 10 mg tablet Take 1 tablet (10 mg total) by mouth nightly. 90 tablet 3   ??? nystatin (MYCOSTATIN) 100,000 unit/mL suspension Take 5 mL by mouth Four (4) times a day.      ??? omeprazole (PRILOSEC) 20 MG capsule Take 1 capsule (20 mg total) by mouth Two (2) times a day. (Patient taking differently: Take 40 mg by mouth daily. ) 180 capsule 3   ??? PARI LC D NEBULIZER Misc Provide 1 device  for use with inhaled medication. 1 each 5   ??? rizatriptan (MAXALT) 10 MG tablet Take one tablet at onset of migraine.  May repeat in  2 hours. 10 tablet 12 ??? sertraline (ZOLOFT) 50 MG tablet Take 1 tablet (50 mg total) by mouth daily. 90 tablet 3   ??? sodium chloride 7% 7 % Nebu INHALE 3 ML VIA NEBUZLIER TWICE DAILY (Patient taking differently: INHALE 4 ML VIA NEBUZLIER TWICE DAILY) 540 mL PRN   ??? syringe with needle (SYRINGE 3CC/20GX1) 3 mL 20 gauge x 1 Syrg For use with inhaled antibiotic 60 Syringe 5   ??? topiramate (TOPAMAX) 100 MG tablet Take 1 tablet (100 mg total) by mouth nightly. 90 tablet 3   ??? fluticasone-salmeterol (ADVAIR HFA) 230-21 mcg/actuation inhaler Inhale 2 puffs Two (2) times a day. (Patient not taking: Reported on 01/14/2018) 1 Inhaler 11     No current facility-administered medications for this visit.        Allergies  Reviewed on: 02/01/2018      Reactions Comment    Codeine Shortness Of Breath, Rash     Rifampin  Fevers, chills.    Daliresp [roflumilast] Other (See Comments) Back pain          Social History     Social History   ??? Marital status: Married     Spouse name: Lupita Leash   ??? Number of children: 3   ??? Years of education: N/A     Occupational History   ???  Disabled     Social History Main Topics   ??? Smoking status: Never Smoker   ??? Smokeless tobacco: Former Neurosurgeon   ??? Alcohol use No   ??? Drug use: No   ??? Sexual activity: Not Asked     Other Topics Concern   ??? None     Social History Narrative    Hasn't yet required services for disability. 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         Objective:   Objective   BP 117/64 (BP Site: L Arm, BP Position: Sitting, BP Cuff Size: Medium)  - Pulse 75  - Temp 36.6 ??C (97.9 ??F) (Oral)  - Resp 20  - Ht 166.9 cm (5' 5.71)  - Wt 89.9 kg (198 lb 3.2 oz)  - SpO2 95% Comment: DME : CPAP at night - BMI 32.27 kg/m??   General Appearance:   Overweight Caucasian male appearing comfortable, non-toxic, in no distress, and stated age.   Eyes:  PERRL, conjunctiva clear, EOM's intact. No drainage.   Ears: Fluid behind left TM but non-bulging and no erythema. Right TM normal.   Nose: Nares normal, septum midline. Markedly congested nasal mucosa with mild erythema.  No drainage or polyps.  No sinus tenderness.   Oropharynx: Moist mucus membranes without lesions or thrush.  Good dentition.  Posterior pharynx clear.  Mallampati Grade IV airway.   Respiratory:   Coarse breath sounds throughout with high pitch squeaks with forceful coughing. Easy work of breathing without accessory muscle use.     Cardiovascular:  Regular rate and rhythm. S1 and S2 normal. No murmur, rub  or gallop.  Pulses intact and symmetric.  No peripheral cyanosis or edema.   Gastrointestinal:   Abdomen protuberant.    Musculoskeletal: No tenderness or deformity of the chest wall.  Joints normal.   No clubbing.   Skin: No rashes, lesions, skin changes.   Heme/Lymph: No cervical, supraclavicular, submandibular, or suprasternal adenopathy.  No bruising or petechiae.   Neurologic: Alert and oriented x3. No focal neurological deficits.  Normal gait.      Diagnostic Review:   Pulmonary Functions Testing Results (11/23/17): Testing was not done today because of recent hemoptysis.    Measures are suggestive of moderate restriction. Values are decreased as compared to 03/25/17 and are not significantly changed from 06/29/17..      Chest CT (03/30/17): Images personally reviewed. Interval worsening of bilateral lower lobe bronchiectasis and bronchial wall thickening with new lingular bronchiectasis. Increased clustered groundglass opacities with areas of consolidation in the lower lobes and lingula. Findings may represent sequela of endobronchial infection or impaired mucus clearance.  Bronchiectasis has been present on CT imaging since 06/12/11.    Cultures  Lab Results   Component Value Date    LABLORES Oropharyngeal Flora Isolated 01/28/2018    LABLORES Mould (A) 01/28/2018    ORG Haemophilus influenzae (A) 06/26/2014    ORG Mould (A) 06/26/2014    ORG Mycobacterium avium complex (A) 06/26/2014    ORG Actinomadura species (A) 06/26/2014    AFBCX  06/29/2017 Overgrown with bacteria/fungus; unable to evaluate for acid fast bacilli.    AFBCX No Acid Fast Bacilli Detected 03/25/2017     Bronchiectasis Evaluation:  IgG with subclasses:   Lab Results   Component Value Date/Time    IGG 818 01/17/2013 10:52 AM     IgA:   Lab Results   Component Value Date/Time    IGA 109 01/17/2013 10:52 AM     IgM:   Lab Results   Component Value Date/Time    IGM 25 (L) 01/17/2013 10:52 AM     IgE:   Lab Results   Component Value Date/Time    IGE 20.9 03/30/2017 11:41 AM    IGE 34.7 11/24/2013 12:34 PM     Specific Titers: Normal Pneumococcal Antibody Titers (07/10/2010, 10/09/2010), Dipheria, and Tetanus (07/10/2010).  HIV:   Lab Results   Component  Value Date/Time    HIVAGAB Nonreactive 01/17/2013 10:52 AM     Neutrophil function (08/07/2010): Normal. Not consistent with CGD.    Autoimmune: ANA (08/26/10) - negative.    ANCA:   Lab Results   Component Value Date/Time    PR3ELISA Negative 03/30/2017 11:41 AM    PR3QT 6.4 03/30/2017 11:41 AM    MPO Negative 03/30/2017 11:41 AM    MPOQT 2.2 03/30/2017 11:41 AM     CBC-D:   Lab Results   Component Value Date/Time    WBC 7.3 11/23/2017 11:46 AM    WBC 6.4 01/04/2015 10:32 AM    HGB 15.6 11/23/2017 11:46 AM    HGB 15.9 01/04/2015 10:32 AM    HCT 46.6 11/23/2017 11:46 AM    HCT 45.9 01/04/2015 10:32 AM    PLT 274 11/23/2017 11:46 AM    PLT 223 01/04/2015 10:32 AM    NEUTROABS 4.6 07/02/2017 11:17 AM    NEUTROABS 5.0 03/21/2014 08:36 AM    EOSABS 0.1 07/02/2017 11:17 AM    EOSABS 0.3 03/21/2014 08:36 AM    BANDS 1+ 07/18/2013 12:20 PM     Alpha-1:         CF testing: Sweat test at [facility] (08/19/10): 65mmol/L - not c/w CF.  PCD testing: nNO (09/10/11): 337.13 nl/min Normal and not c/w PCD    Health Care Maintenance:  Most Recent Immunizations   Administered Date(s) Administered   ??? Hep A / Hep B 08/26/2010   ??? Hepatitis B, Adult 09/16/2010   ??? INFLUENZA TIV (TRI) PF (IM) 11/17/2011   ??? Influenza Vaccine Quad (IIV4 PF) 33mo+ injectable 09/22/2016   ??? Influenza Vaccine Quad (IIV4 W/PRESERV) 71MO+ 09/26/2013   ??? Influenza Virus Vaccine, unspecified formulation 10/08/2017   ??? PNEUMOCOCCAL POLYSACCHARIDE 23 11/23/2017   ??? Pneumococcal Conjugate 13-Valent 11/24/2013   ??? TdaP 12/22/2008

## 2018-02-01 NOTE — Unmapped (Signed)
[facility]: Pharmacist Clinical Assessment Note: Adult Pulmonary    Completed clinical pharmacist assessment today with Isabella Bowens Sr. in clinic.    CLINICAL ASSESSMENT     Medication Reconciliation: Reviewed the indication, dose, and frequency of each medication with patient. All medications, allergies, and preferred pharmacy list were updated in EPIC medication profile   -Reports just completing 14-day course of Augmentin  -Has 1 more Dulera to use and then switching to Advair HFA  -Alternates between cetirizine and fexofenadine    Adherence Assessment: Moderate compliance: Minor variances to doses prescribed, misses occasional doses during the week.   -Reviewed mixing and administration of Inhaled colistin, he starts cycle tomorrow  -He explains varying mixing with what is prescribed, will plan to rewrite for SWFI and Colistin for next cycle and provided him with mixing instructions again in AVS and verbal instruction review  Last filled 01/27/18:   NS  Colistin  Syringes  Alcohol Pads    Drug-drug interactions reviewed: Yes  Interaction identified: No    Side effects: Patient is tolerating medications, no side effects to report.     Counseled patient on the following:   Adherence/missed doses  Cost of medications/cost implications  Doses and administration  Pharmacy contact information  Possible adverse effects and management  Safe handling, storage, and disposal  Therapeutic rationale    PLAN     -Therapy is appropriate to continue, no changes were made to regimen.  -If applicable, follow up labs are up to date.    FOLLOW-UP:  Next clinic follow-up: TBD  Next pharmacy clinical assessment due (in 6 months): 08/04/18    MEDICATION ACCESS & REFILL COORDINATION     Changes to insurance: No  [facility] continues to manage refill coordination with patient.    Anell Barr, PharmD, BCPS, CPP  Clinical Pharmacist Practitioner  [facility] Adult Cystic Fibrosis [facility]  [facility] Bron[facility]  Pager: [phone]

## 2018-02-01 NOTE — Unmapped (Signed)
Ordering a chest CT scan - want to get it today.    Augmentin for 3 weeks. If not better after Augmentin or if we find something on CT, will schedule bronchoscopy.    Sputum for culture today.    Symptoms of a bronchiectasis exacerbation: 48 hours of at least three of the following:  ?? Increased cough  ?? Change in volume or appearance of sputum  ?? Increased sputum purulence  ?? Worsening shortness of breath and/or exercise tolerance  ?? Fatigue and/or malaise  ?? Coughing up blood (hemoptysis)    If you are experiencing some of these symptoms:  ?? Increase the frequency and/or intensity of your airway clearance  ?? Try to submit a sputum sample for bacterial and AFB cultures (at [facility] or locally)  ?? Reach out to me or your local physician.  ?? If you need antibiotics, recommend treating for 14 days.      Please bring any new airway clearance equipment to your next visit to ensure that you are using and caring for it properly.    Thank you for allowing me to be a part of your care. Please call the clinic with any questions.    Viona Gilmore, MD, MPH  Pulmonary and Critical Care Medicine  [address]  [id number]  [place], [place] [address]    Thank you for your visit to the [facility] Pulmonary Clinics. You may receive a survey from [facility] regarding your visit today, and we are eager to use this feedback to improve your experience. Thank you for taking the time to fill it out.    Between appointments, you can reach [place] at these numbers:    For appointments or the Pulmonary Nurse: [phone]  Fax: [phone]    For urgent issues after hours:  Hospital Operator: [phone], ask for Pulmonary Fellow on call    For further information, check out the websites below:    [facility] for Bronchiectasis: [email or web address]    World Bronchiectasis Conference in [place] Patient session May 15, 2017: Videos can be watched here.     Bronchiectasis Toolbox (information about airway clearance): DiningCalendar.de    Copy for Individuals with Bronchiectasis and/or NTM through the Bronchiectasis Registry and the COPD Foundation: https://www.mckee-young.org/    Information about Non-tuberculous Mycobacterial Infections:  https://www.ntminfo.org     Interested in clinical trials and other research opportunities?  www.clinicaltrials.gov  The Rare Diseases Clinical Research Network [facility]) Genetic Disorders of  Mucociliary Clearance Consortium [facility]) Contact Registry is a way for patients with disorders of mucociliary clearance (such as bronchiectasis) and their family members to learn about research studies they may be able to join. Participation is completely voluntary and you may choose to withdraw at any time. There is no cost to join the [employer]. For more information or to join the registry please go to the following website:  [email or web address].hu    INHALED COLISTIN  ??  Medication & Administration   ??  Medication and dosage: Colistin 150mg : Inhale 2 mL (150 mg)  of mixed colistin and 1 mL of SWFI (total volume: 3 mL) every twelve (12) hours via nebulizer. For cycled 28 days on and 28 days off.   ??  How Supplied:  ?? Colistin 75mg /mL (2-mL vial powder for injection)  ?? Sterile Water for Injection (SWFI) vials  ?? Alcohol swabs  ?? 3-mL syringe with needles  ??  How to Use Colistin for Inhaling:  Step 1: Remove the lids from  the colistin and SWFI and wipe top of rubber vial with alcohol swab.  ??  Step 2: Uncap the 3-mL syringe with needle and draw up 2mL of air and add that air to the SWFI vial, then draw up 2mL of SWFI. Remove syring and inject 2mL of SWFI into the colistin vial.  ??  Step 3: Gently roll the Colistin vial to mix, do not shake vigorously as this will create foaming.  ??  Step 4: Based on what dose prescribed:  ??  For 150-mg Dose:   ??  -Uncap the 3-mL syringe with needle and draw up 2mL of air and add that to the mixed colistin vial  -Leaving the syringe in the vial, invert the vial and withdraw 2mL (150 mg) of colistin and add to nebulizer cup.  -Draw up 1mL of air into your syringe again and add that to the SWFI, then draw up 1mL of SWFI and add to nebulizer cup  -You should have a total volume: 3mL   ??  Step 5: Inhale the colistin mixture by aerosol nebulizer twice a day for 28 days on and 28 days off.  Do not be alarmed if you see the colistin foaming in your nebulizer, this commonly happens during nebulizer treatment and cannot be prevented.  ??  Step 6: After use, rinse nebulizer well with warm water to prevent build up.  ??  Discard any remaining supply in the vials you have used for this dose. All vials are single use only.  ??  Missed dose instruction:   ?? If it is close to your next dose then wait until the next dose and resume your normal dosing schedule. Do not take more than your normal dose to make up for a missed dose.   ??  Instructions for storage:   ?? Store at room temperature  ??  Common Side Effects   ??  ?? Bronchospasm, coughing (can administered albuterol prior to colistin dose)  ?? Chest tightness  ?? Wheezing

## 2018-02-02 ENCOUNTER — Encounter: Admit: 2018-02-02 | Discharge: 2018-02-02 | Payer: PRIVATE HEALTH INSURANCE

## 2018-02-02 ENCOUNTER — Ambulatory Visit: Admit: 2018-02-02 | Discharge: 2018-02-03 | Payer: PRIVATE HEALTH INSURANCE

## 2018-02-02 DIAGNOSIS — R042 Hemoptysis: Principal | ICD-10-CM

## 2018-02-02 DIAGNOSIS — J320 Chronic maxillary sinusitis: Principal | ICD-10-CM

## 2018-02-02 DIAGNOSIS — J470 Bronchiectasis with acute lower respiratory infection: Secondary | ICD-10-CM

## 2018-02-02 DIAGNOSIS — J310 Chronic rhinitis: Secondary | ICD-10-CM

## 2018-02-07 MED ORDER — SYRINGE WITH NEEDLE 3 ML 20 GAUGE X 1"
INJECTION | 5 refills | 0 days | Status: CP
Start: 2018-02-07 — End: 2018-11-18

## 2018-02-07 MED ORDER — BD LUER-LOK SYRINGE 3 ML 20 GAUGE X 1"
0 refills | 0 days | Status: CP
Start: 2018-02-07 — End: 2019-03-31

## 2018-02-07 MED ORDER — COLISTIN (COLISTIMETHATE SODIUM) 150 MG SOLUTION FOR INJECTION
Freq: Two times a day (BID) | RESPIRATORY_TRACT | 5 refills | 0.00000 days | Status: CP
Start: 2018-02-07 — End: 2018-02-07

## 2018-02-07 MED ORDER — ALCOHOL SWABS: each | 0 refills | 0 days | Status: AC

## 2018-02-07 MED ORDER — COLISTIN (COLISTIMETHATE SODIUM) 150 MG SOLUTION FOR INJECTION: 150 mg | vial | Freq: Two times a day (BID) | 5 refills | 0 days | Status: AC

## 2018-02-07 MED ORDER — WATER FOR INJECTION, STERILE INJECTION SOLUTION
5 refills | 0 days | Status: CP
Start: 2018-02-07 — End: 2018-09-27

## 2018-02-07 MED ORDER — NEBULIZERS
5 refills | 0 days | Status: CP
Start: 2018-02-07 — End: 2018-11-18

## 2018-02-07 MED ORDER — ALCOHOL SWABS
0 refills | 0.00000 days | Status: CP
Start: 2018-02-07 — End: 2018-02-07

## 2018-02-07 MED ORDER — SYRINGE WITH NEEDLE 3 ML 20 GAUGE X 1 1/2"
26 refills | 0 days
Start: 2018-02-07 — End: 2019-02-07

## 2018-02-11 NOTE — Unmapped (Signed)
Pt's family requested CT result from 02/01/18. Pt's family also stated that they need a PA for cetirizine (ZYRTEC) 10 MG tablet and fexofenadine (ALLEGRA) 180 MG tablet. Pt information was given to [facility] to follow up with PA for the medications.

## 2018-02-14 MED FILL — OMEPRAZOLE/20MG/CPDR: OMEPRAZOLE/20MG/CPDR | 90 days supply | Qty: 180 | Fill #2

## 2018-02-19 NOTE — Unmapped (Signed)
Called and spoke to patient about chest CT results. Explained that CT findings and 2 sputum cultures yielding Scedosporium species.  I think that his explains his hemoptysis. Given his liver disease, I wanted to have ID involved in his care before starting him on an anti-fungal. He is also seeing hepatology the day before he returns to see me in clinic.  This will allow ID, hepatology, and I to generate a plan for his care. If ID doesn't think that Scedosporium explains his hemoptysis, we will plan pursuing bronchoscopy.    [name] is still coughing up yellow green foul tasting sputum although he thinks it is a little better than before the Augmentin. His side pain has improved a little too.  He is continuing to cough up blood.  It has decreased in volume and is darker in color. It is mixed with sputum and occurs 2-3 times a day after coughing very forcefully.  Instructed [place] to call the fellow on call if the volume increases or is brighter in color.    He is aware that his insurance will not cover OTC anti-histamines. He is okay with paying $30/month.

## 2018-02-19 NOTE — Unmapped (Signed)
-----   Message from Brynda Greathouse, RN BSN sent at 02/11/2018  2:07 PM EDT -----  Regarding: CT result  Contact: [phone]  Dr. Garner [place],    Pt's family requested CT result from 02/01/18. Pt's family also stated that they need a PA for cetirizine (ZYRTEC) 10 MG tablet and fexofenadine (ALLEGRA) 180 MG tablet. Pt information was given to [facility] to follow up with PA for the medications - likely that it will not be covered by his insurance.      Thank you,  -Norberto Sorenson

## 2018-03-01 ENCOUNTER — Encounter: Admit: 2018-03-01 | Discharge: 2018-03-02 | Payer: PRIVATE HEALTH INSURANCE

## 2018-03-01 DIAGNOSIS — R05 Cough: Principal | ICD-10-CM

## 2018-03-01 LAB — CBC W/ AUTO DIFF
BASOPHILS ABSOLUTE COUNT: 0 10*9/L (ref 0.0–0.1)
BASOPHILS RELATIVE PERCENT: 0.6 %
EOSINOPHILS ABSOLUTE COUNT: 0.1 10*9/L (ref 0.0–0.4)
EOSINOPHILS RELATIVE PERCENT: 1.4 %
HEMOGLOBIN: 15.2 g/dL (ref 13.5–17.5)
LARGE UNSTAINED CELLS: 1 % (ref 0–4)
LYMPHOCYTES RELATIVE PERCENT: 11.4 %
MEAN CORPUSCULAR HEMOGLOBIN: 28.4 pg (ref 26.0–34.0)
MEAN CORPUSCULAR VOLUME: 87 fL (ref 80.0–100.0)
MEAN PLATELET VOLUME: 7.9 fL (ref 7.0–10.0)
MONOCYTES ABSOLUTE COUNT: 0.5 10*9/L (ref 0.2–0.8)
MONOCYTES RELATIVE PERCENT: 6 %
NEUTROPHILS ABSOLUTE COUNT: 5.9 10*9/L (ref 2.0–7.5)
NEUTROPHILS RELATIVE PERCENT: 79.4 %
PLATELET COUNT: 280 10*9/L (ref 150–440)
RED BLOOD CELL COUNT: 5.36 10*12/L (ref 4.50–5.90)
RED CELL DISTRIBUTION WIDTH: 14.2 % (ref 12.0–15.0)
WBC ADJUSTED: 7.5 10*9/L (ref 4.5–11.0)

## 2018-03-01 LAB — HEMATOCRIT: Lab: 46.6

## 2018-03-01 LAB — COMPREHENSIVE METABOLIC PANEL
ALBUMIN: 4.6 g/dL (ref 3.5–5.0)
ALKALINE PHOSPHATASE: 96 U/L (ref 38–126)
ALT (SGPT): 35 U/L (ref 19–72)
ANION GAP: 11 mmol/L (ref 9–15)
AST (SGOT): 27 U/L (ref 19–55)
BILIRUBIN TOTAL: 0.6 mg/dL (ref 0.0–1.2)
BLOOD UREA NITROGEN: 13 mg/dL (ref 7–21)
BUN / CREAT RATIO: 16
CALCIUM: 9.4 mg/dL (ref 8.5–10.2)
CHLORIDE: 105 mmol/L (ref 98–107)
CO2: 26 mmol/L (ref 22.0–30.0)
CREATININE: 0.79 mg/dL (ref 0.70–1.30)
EGFR MDRD AF AMER: >=60 mL/min/{1.73_m2} (ref >=60)
GLUCOSE RANDOM: 126 mg/dL (ref 65–179)
POTASSIUM: 4.5 mmol/L (ref 3.5–5.0)
PROTEIN TOTAL: 7.6 g/dL (ref 6.5–8.3)
SODIUM: 142 mmol/L (ref 135–145)

## 2018-03-01 LAB — SODIUM: Sodium:SCnc:Pt:Ser/Plas:Qn:: 142

## 2018-03-01 NOTE — Unmapped (Signed)
It was great to see you today!  Plan:  -labs today  -repeat sputum culture  -will discuss with Dr. Garner Nash      ?? The ID clinic phone number is [phone] (toll free [phone]).  ?? The ID clinic fax number is [phone].    For urgent issues on nights and weekends you may reach the ID Physician on call through the [facility] Operator at [phone].    Mickeal Needy MD  [facility]   [address], 1st floor   [place], [place]. [address]

## 2018-03-01 NOTE — Unmapped (Signed)
[facility] NEW PATIENT EVALUATION      Brett Bowens Sr. is being seen in consultation at the request of Dr. Garner Nash for evaluation of positive sputum culture for Scedosporium spp.    Assessment/Recommendations:    52 yo male with history of chronic bronchiectasis in the setting of alpha antitrypsin carrier state who has had episodes of mild hemoptysis for the last several weeks and has had 2 sputum  cxs (3/29 and 4/2) positive for Scedosporium spp and Streptomyces spp. A CT scan chest from 4/3 showed worsening bibabilar bronchiectasis and surrounding nodular infiltrates. Neither Scedosporium or Streptomyces are common pathogens and the isolation in the sputum does not differentiate between colonization and infection. A lot of his symptoms (cough, sob with exertion, sputum production) are chronic and hemoptysis can also be seen in the setting of bronchiectasis. He is feeling a little better after his most recent course of Augmentin which also would point against these isolates being pathogenic (although Augmentin may have activity against Streptomyces). Treatment for either scedosporium or streptomyces would be lengthy, so it would be useful to try to establish the diagnosis before starting treatment.  I will repeat sputum culture and also send serum beta D glucan and galactomannan. Will discuss with Dr. Garner Nash about repeating a CT chest at this point or doing a broch/lung biopsy. Another option would be start him on a prolonged course of antibiotics covering for streptomyces (such as bactrim or doxycycline), since he improved on Augmentin and maybe withhold antifungals for now. If we need to treat for scedosporium, he should be able to tolerate Voriconazole given normal LFTs even if he has fatty liver disease.    ID Problem List:  1. Positive sputum cx for scedosporium spp and streptomyces  2. Bronchieactasis due to alpha antitrypsin carrier state  3. 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 Prior hx of pulmonary MAI    Recommendations:  1. No treatment for scedosporium or streptomyces for now, but may need to reassess (as mentioned above)  2. Repeat sputum cx (AFB, bacterial, fungal)  3. Check beta D glucan and galactomannan  4. Will discuss with Dr. Garner Nash about potentially repeating the CT chest or proceeding with bronch/lung biopsy.    Recommendations communicated via shared medical record.    Disposition:  Return to clinic as needed if symptoms worsen and depending on repeat sputum culture      History of Present Illness:      Brett Bowens Sr. is a 52 y.o. male with history of chronic bronchiectasis in the setting of alpha antitrypsin carrier state who has had episodes of mild hemoptysis for the last several weeks and has had 2 sputum  cxs (3/29 and 4/2) positive for Scedosporium spp and Streptomyces spp. A CT scan chest from 4/3 showed worsening bibabilar bronchiectasis and surrounding nodular infiltrates.   He has chronic productive cough and sob with exertion and is frequently on antibiotics.   About 2-3 months ago he started having episodes of hemoptysis (having taste of blood in mouth and coughing up small amounts of brownish blood). He also has been feeling more tired for the last few weeks. No fever. He has chronic L posterior pleuritic chest pain, worse when he gets tired and improving with cpap use. He has received 2 courses of Augmentin over the last few weeks and has noticed some improvement after his most recent course (completed a few days ago).  Allergies:  Codeine; Rifampin; and Daliresp [roflumilast]    Medications:   Current antibiotics:  Inhaled colistin    Previous antibiotics:  Augmentin      Other medications reviewed.     Medical History:  Past Medical History:   Diagnosis Date   ??? Allergic rhinitis    ??? Alpha-1-antitrypsin deficiency carrier (CMS-HCC)     with slightly low levels   ??? Amblyopia    ??? Asthma    ??? Asthma, extrinsic    ??? Bronchiectasis (CMS-HCC) 06/12/2011   ??? Chronic sinusitis    ??? Colon polyps    ??? Diabetes mellitus (CMS-HCC)    ??? GERD (gastroesophageal reflux disease)    ??? Impaired glucose tolerance    ??? Obesity    ??? Pneumonia    ??? Pseudomonas respiratory infection    ??? 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 Sleep apnea    ??? Strabismus    ??? Strep throat        Surgical History:  Past Surgical History:   Procedure Laterality Date   ??? BRONCHOSCOPY     ??? LUNG BIOPSY     ??? PR BRONCHOSCOPY,DIAGNOSTIC W BRUSH Bilateral 08/21/2013    Procedure: BRONCHOSCOPY, RIGID OR FLEXIBLE, INCLUDING FLOURO GUIDED; DIAGNOSTIC, WITH BRUSHING OR PROTECTED BRUSHINGS;  Surgeon: Nash Dimmer, MD;  Location: [facility] PROCEDURE LAB [facility];  Service: Pulmonary   ??? PR BRONCHOSCOPY,DIAGNOSTIC W LAVAGE Bilateral 08/21/2013    Procedure: BRONCHOSCOPY, RIGID OR FLEXIBLE, INCLUDE FLUOROSCOPIC GUIDANCE WHEN PERFORMED; W/BRONCHIAL ALVEOLAR LAVAGE;  Surgeon: Nash Dimmer, MD;  Location: [facility] PROCED[facility];  Service: Pulmonary   ??? PR COLONOSCOPY FLX DX W/COLLJ SPEC WHEN PFRMD N/A 03/21/2015    Procedure: COLONOSCOPY, FLEXIBLE, PROXIMAL TO SPLENIC FLEXURE; DIAGNOSTIC, W/WO COLLECTION SPECIMEN BY BRUSH OR WASH;  Surgeon: Brendia Sacks, MD;  [facility];  Service: Gastroenterology   ??? SHOULDER SURGERY Right        Social History:  Tobacco use:   reports that he has never smoked. He has quit using smokeless tobacco.   Alcohol use:    reports that he does not drink alcohol.   Drug use:    reports that he does not use drugs.     Family History:  Family History   Problem Relation Age of Onset   ??? Colon cancer Father    ??? Emphysema Maternal Uncle    ??? Heart disease Maternal Uncle    ??? Diabetes Paternal Grandfather        Immunizations:  Immunization History   Administered Date(s) Administered   ??? Hep A / Hep B 08/26/2010   ??? Hepatitis B, Adult 09/16/2010   ??? INFLUENZA TIV (TRI) PF (IM) 09/22/2010, 11/17/2011   ??? Influenza Vaccine Quad (IIV4 PF) 57mo+ injectable 08/15/2012, 07/20/2014, 08/14/2015, 09/22/2016   ??? Influenza Vaccine Quad (IIV4 W/PRESERV) 85MO+ 09/26/2013   ??? Influenza Virus Vaccine, unspecified formulation 08/16/2015, 10/08/2017   ??? PNEUMOCOCCAL POLYSACCHARIDE 23 07/10/2010, 11/23/2017   ??? Pneumococcal Conjugate 13-Valent 11/24/2013   ??? TdaP 12/22/2008       Review of Systems:  10 systems reviewed and negative except as per HPI.     Objective     Vital Signs:  BP 122/76  - Pulse 65  - Temp 36.6 ??C (97.9 ??F) (Oral)  - Ht 166.9 cm (5' 5.71)  - Wt 88.6 kg (195 lb 6.4 oz)  - BMI 31.82 kg/m??     Physical Exam:  GENERAL: Well appearing, no distress  HEENT: PERRL, EOMI, normal oral mucosa, normocephalic and atraumatic  Neck:supple  Lymphatic: no lymphadenopathy  Respiratory: crackles in bases bilaterally  Cardiovascular: RRR, S1S2, no m/r/g.  Gastrointestinal: soft/NT/ND, normoactive bowel sounds  SKIN: no rash  Musculoskeletal: no edema, Normal ROM of all joints  NEURO: Alert and interactive, no focal deficits.   PSYCH: appropriate affect      Labs:    Lab Results   Component Value Date    WBC 7.5 03/01/2018    HGB 15.2 03/01/2018    HCT 46.6 03/01/2018    PLT 280 03/01/2018       Lab Results   Component Value Date    NA 142 03/01/2018    K 4.5 03/01/2018    CL 105 03/01/2018    CO2 26.0 03/01/2018    BUN 13 03/01/2018    CREATININE 0.79 03/01/2018    GLU 126 03/01/2018    CALCIUM 9.4 03/01/2018    MG 2.2 01/06/2013    PHOS 3.3 01/06/2013       Lab Results   Component Value Date    BILITOT 0.6 03/01/2018    BILIDIR 0.20 01/14/2018    PROT 7.6 03/01/2018    ALBUMIN 4.6 03/01/2018    ALT 35 03/01/2018    AST 27 03/01/2018    ALKPHOS 96 03/01/2018    GGT 41 01/06/2013       Microbiology:    Sputum cultures were reviewed    Studies:   CT chest high resolution: 02/02/18:  Impression     Since 03/30/2017,  -Progression of basilar predominant bronchiectasis (left greater than right), and multifocal tree-in-bud and nodular consolidative opacities, compatible with worsening chronic bronchopneumonia (such as reported MAI infection), pulmonary hemorrhage (given reported hemoptysis) and/or impaired mucous clearing (such as ciliary dyskinesia, etc.).    -Increase in lower AP window lymphadenopathy, likely reactive. Mediastinal lymphadenopathy elsewhere unchanged.           Mickeal Needy MD   [facility]   [address] 1st floor   [place], [place]. [address]   Phone: [phone]   Fax: [phone]

## 2018-03-02 ENCOUNTER — Encounter: Admit: 2018-03-02 | Discharge: 2018-03-02 | Payer: PRIVATE HEALTH INSURANCE

## 2018-03-02 DIAGNOSIS — J471 Bronchiectasis with (acute) exacerbation: Principal | ICD-10-CM

## 2018-03-04 LAB — FUNGITELL ASSAY: FUNGITELL: <31 pg/mL

## 2018-03-04 LAB — FUNGITELL: Lab: <31

## 2018-03-07 ENCOUNTER — Encounter
Admit: 2018-03-07 | Discharge: 2018-03-08 | Payer: PRIVATE HEALTH INSURANCE | Attending: Internal Medicine | Primary: Internal Medicine

## 2018-03-07 DIAGNOSIS — Z148 Genetic carrier of other disease: Principal | ICD-10-CM

## 2018-03-07 DIAGNOSIS — K760 Fatty (change of) liver, not elsewhere classified: Secondary | ICD-10-CM

## 2018-03-07 NOTE — Unmapped (Signed)
[facility]  [facility]    Assessment:      Patient:Brett Hanna. ([birth year])  Reason for visit: BrettHanna is a 52 y.o.male who returns for follow-up of bronchiectasis secondary to alpha-1 antitrypsin carrier state with prior NTM infection status post treatment and OSA who presents today for follow up after completing 3 weeks of Augmentin with slight improvement in symptoms but decline in spirometry.  I do worry about the Scedosporium as the cause of his symptoms since we have seen this type of infection occur in CF bronchiectasis.     Plan:      Problem List Items Addressed This Visit        Respiratory    Bronchiectasis (CMS-HCC) - Primary     Bronchiectasis Severity Index Score:    Age 57-69 = 2   BMI 18.5 or higher = 0   FEV1 % predicted 50-80% = 1   Hospitalized for severe exacerbation in past 2 years No = 0   Exacerbations in previous year 3 or more = 2   mMRC dyspnea score 3 = I stop for breath after walking about 100 yards or after a few minutes on level ground.   Chronic Pseudomonas colonization (at least 2 positive cultures at least 3 months apart within 1 year) No = 0   Colonization with other potential pathogenic bacteria (at least 2 positive cultures at least 3 months apart within 1 year) No = 0   Radiologic Severity 3 or more lobes or cystic brx = 1       Total Score 9     BSI score of 9+ = Severe bronchiectasis (1 year outcomes 7.6 - 10.5% mortality, 16.7 - 52.6% hospitalization rate; 4 year outcomes 9.9 - 29.2% mortality, 41.2 - 80.4% hospitalization)    Clinton Quant JD et al. The Bronchiectasis Severity Index: An International Derivation and Validation Study. AJRCCM, 2013; 189(5): [phone].)         Relevant Medications    sulfamethoxazole-trimethoprim (BACTRIM) 400-80 mg per tablet    Other Relevant Orders    AFB culture    Lower Respiratory Culture    Flow volume loop    CT Chest Wo Contrast    Obstructive sleep apnea syndrome    Relevant Orders    Polysomnography (with CPAP)      Reinforced importance of regular airway clearance.  In bronchiectasis, there is impaired mucus clearance from both the airway changes and from the underlying cause of the bronchiectasis.  This impaired clearance leads to accumulation of mucus, resulting in inflammation, accumulation of bacteria, and worsening damage to the airways.  Therefore, augmentation of airway clearance is critical. Brett Hanna was provided websites with information about bronchiectasis, social forums for patients with bronchiectasis and NTM, and airway clearance techniques    Patient met with the respiratory therapist today to discuss airway clearance methods, discuss home NIPPV equipment and provide spacer and review inhaler technique. Patient should bring any new airway clearance devices to the next visit to review technique with the respiratory therapist.  No changes were made to airway clearance regimen.  No changes were made to inhaled medications. He should hold airway clearance for 24 hours if he experiences more hemoptysis.    Patient spontaneously expectorated sputum that was sent for bacterial and AFB cultures. These results will help to guide antibiotic therapy in the future during exacerbations.  If he has future exacerbations, sputum should be sent for bacterial and  AFB cultures. The bacterial culture should be processed like a cystic fibrosis sample given the overlapping pathogens. There are standing orders for these cultures in our system.    Agree with treating with Bactrim to cover for Streptomyces based on recommendations in Dr. Linda Hedges note. Sent order for 21 days of Bactrim to local pharmacy. Holding on anti-fungal agent until we see how he does with Bactrim and repeat chest CT.    Referral for sleep study at [facility] was placed for titration. Repeating chest CT to assess for progression. If he fails to improve after Bactrim or if we find something on his chest CT that needs to be investigated, we will schedule a bronchoscopy.    Brett Hanna will return to clinic in 3 months for follow-up with pre-bronchodilator spirometry and sputum cultures.  He will call or send me a message via MyChart if questions or concerns arise before this visit.      Subjective:      HPI: Brett Hanna is a 51 y.o. male who returns for follow-up of bronchiectasis secondary to alpha-1 antitrypsin carrier state with prior NTM infection status post treatment and OSA.  Last couple days of taking Augmentin he started to feel better but I am unable to pin down if symptoms improved while on Augmentin or after stopping it. Still has trouble breathing.     Since the last clinic visit on 02/01/18:  ?? Cough unchanged. Remains productive of pale to green sticky sputum. Sputum taste has improved a little improved. Woke up with dry coughing spell (1-2 minutes) and difficulty breathing last week. Improved with albuterol.  ?? Wheezing is present and at times is just as bad as prior.  Chest tightness is present intermittently, when less productive cough. Using rescue albuterol inhaler 2-3 times per day at least (with spacer).  In addition to the three times a day nebulized (twice a day before HTS).   ?? Pleurisy is absent.  ?? He has experienced hemoptysis. On Friday, had blood mixed with sputum. No bright red blood.  ?? He is still tired and short of breath, particularly when talking but it is not quite as bad as it was prior to most recent course of Augmentin.    In the past year:  ?? he has been treated for an exacerbation53 times.  his last exacerbation was 02/01/18 when I treated him with Augmentin following hemoptysis.  ?? he has not been admitted for an exacerbation.     For airway clearance, Brett Hanna is using Vest 2 time a day, Aerobika 2 times per day and Hypertonic Saline 7% 2 times per day.  Likes the new Vest - feels like it helps him to cough up mucus more easily. Smart Vest score indicates good compliance.  He does cycle inhaled antibiotics. He is currently on [place].  Exercise capacity is rated as good. MMRC of 3 = I stop for breath after walking about 100 yards or after a few minutes on level ground.  Brett Hanna is not on home oxygen therapy.  Has CPAP at night - 15 cm H2O. Has woken up recently with it but thinks settings are okay otherwise. However, with further probing, he tells me that he wakes up 6 out of 7 nights in the middle of the night. Last sleep study was done 04/19/12.    Review of Systems  Remainder of a complete review of systems was negative unless mentioned above.    Past Medical History:   Diagnosis Date   ??? Allergic rhinitis    ???  Alpha-1-antitrypsin deficiency carrier (CMS-HCC)     with slightly low levels   ??? Amblyopia    ??? Asthma    ??? Asthma, extrinsic    ??? Bronchiectasis (CMS-HCC) 06/12/2011   ??? Chronic sinusitis    ??? Colon polyps    ??? Diabetes mellitus (CMS-HCC)    ??? GERD (gastroesophageal reflux disease)    ??? H/O Mycobacterium avium complex infection    ??? Impaired glucose tolerance    ??? Obesity    ??? Pneumonia    ??? Pseudomonas respiratory infection    ??? 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 Sleep apnea    ??? Strabismus    ??? Strep throat        Current Outpatient Medications   Medication Sig Dispense Refill   ??? acetaminophen (TYLENOL EXTRA STRENGTH) 500 MG tablet Take 1 tablet by mouth as needed for pain     ??? albuterol (PROVENTIL HFA;VENTOLIN HFA) 90 mcg/actuation inhaler Inhale 2 puffs every four (4) hours as needed for wheezing. 1 Inhaler 11   ??? albuterol 2.5 mg /3 mL (0.083 %) nebulizer solution Inhale 3 mL (2.5 mg total) by nebulization Two (2) times a day. And every 6 hours as needed for SOB/Wheezing. 540 mL 11   ??? alcohol swabs PadM Use as directed with inhaled antibiotics 100 each 0   ??? aztreonam lysine (CAYSTON) 75 mg/mL Nebu nebulization solution Inhale 1 mL (75 mg total) Three (3) times a day. 28 days on and 28 days off. 84 mL 5   ??? cetirizine (ZYRTEC) 10 MG tablet Take 1 tablet (10 mg total) by mouth daily. 90 tablet 3   ??? cholecalciferol, vitamin D3, (VITAMIN D3) 2,000 unit cap Take 1 capsule (2,000 Units total) by mouth daily. 30 capsule prn   ??? colistimethate (COLYMYCIN) 150 mg injection Inhale 2 mL (150 mg total) Two (2) times a day. Inject 2mL SWFI into colistin vial, then withdraw 2mL (150mg ) for dose. 60 vial 5   ??? fexofenadine (ALLEGRA) 180 MG tablet Take 1 tablet (180 mg total) by mouth daily. 90 tablet 3   ??? fluticasone (FLONASE) 50 mcg/actuation nasal spray USE 2 SPRAYS IN EACH NOSTRIL DAILY (Patient taking differently: USE 2 SPRAYS IN EACH DAILY AS NEEDED FOR RHINITIS) 16 g 5   ??? fluticasone-salmeterol (ADVAIR HFA) 230-21 mcg/actuation inhaler Inhale 2 puffs Two (2) times a day. 1 Inhaler 11   ??? metFORMIN (GLUCOPHAGE-XR) 500 MG 24 hr tablet Take 2 tablets (1,000 mg total) by mouth every morning before breakfast. 180 tablet 3   ??? MISCELLANEOUS MEDICAL SUPPLY MISC Frequency:PHARMDIR   Dosage:0.0     Instructions:  Note:CPAP 15 cm H20 with heated humidity for nasal dryness.  Mask: ResMed Quattro size Medium. Dx OSA. Dose: 1     ??? montelukast (SINGULAIR) 10 mg tablet Take 1 tablet (10 mg total) by mouth nightly. 90 tablet 3   ??? nystatin (MYCOSTATIN) 100,000 unit/mL suspension Take 5 mL by mouth Four (4) times a day.      ??? omeprazole (PRILOSEC) 20 MG capsule Take 1 capsule (20 mg total) by mouth Two (2) times a day. (Patient taking differently: Take 40 mg by mouth daily. ) 180 capsule 3   ??? PARI LC D NEBULIZER Misc Provide 1 device  for use with inhaled medication. 1 each 5   ??? rizatriptan (MAXALT) 10 MG tablet Take one tablet at onset of migraine.  May repeat in  2 hours. 10 tablet 12   ??? sertraline (ZOLOFT) 50  MG tablet Take 1 tablet (50 mg total) by mouth daily. 90 tablet 3   ??? sodium chloride 7% 7 % Nebu INHALE 3 ML VIA NEBUZLIER TWICE DAILY (Patient taking differently: INHALE 4 ML VIA NEBUZLIER TWICE DAILY) 540 mL PRN   ??? sterile water, PF, Soln Add 1mL SWFI into neb cup along with colistin. 60 ampule 5   ??? syringe with needle (SYRINGE 3CC/20GX1) 3 mL 20 gauge x 1 Syrg For use with inhaled antibiotic 60 Syringe 5   ??? topiramate (TOPAMAX) 100 MG tablet Take 1 tablet (100 mg total) by mouth nightly. 90 tablet 3     No current facility-administered medications for this visit.        Allergies  Reviewed at 7:56 AM      Reactions Comment    Codeine Shortness Of Breath, Rash     Rifampin  Fevers, chills.    Daliresp [roflumilast] Other (See Comments) Back pain          Social History     Socioeconomic History   ??? Marital status: Married     Spouse name: Lupita Leash   ??? Number of children: 3   ??? Years of education: Not on file   ??? Highest education level: Not on file   Occupational History     Employer: DISABLED   Social Needs   ??? Financial resource strain: Not on file   ??? Food insecurity:     Worry: Not on file     Inability: Not on file   ??? Transportation needs:     Medical: Not on file     Non-medical: Not on file   Tobacco Use   ??? Smoking status: Never Smoker   ??? Smokeless tobacco: Former Estate agent and Sexual Activity   ??? Alcohol use: No     Alcohol/week: 0.0 oz   ??? Drug use: No   ??? Sexual activity: Yes     Partners: Female   Lifestyle   ??? Physical activity:     Days per week: Not on file     Minutes per session: Not on file   ??? Stress: Not on file   Relationships   ??? Social connections:     Talks on phone: Not on file     Gets together: Not on file     Attends religious service: Not on file     Active member of club or organization: Not on file     Attends meetings of clubs or organizations: Not on file     Relationship status: Not on file   Other Topics Concern   ??? Do you use sunscreen? Not Asked   ??? Tanning bed use? Not Asked   ??? Are you easily burned? Not Asked   ??? Excessive sun exposure? Not Asked   ??? Blistering sunburns? Not Asked   Social History Narrative    Hasn't yet required services for disability. 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         Objective:   Objective   BP 129/68  - Pulse 77  - Temp 36.4 ??C (97.5 ??F) (Oral)  - Resp 18  - Wt 88.5 kg (195 lb)  - SpO2 95%  - BMI 31.75 kg/m??   General Appearance:   Overweight Caucasian male appearing comfortable, non-toxic, in no distress, and stated age.   Eyes:  PERRL, conjunctiva clear, EOM's intact. No drainage.   Nose: Nares normal, septum midline. Markedly congested  nasal mucosa with mild erythema.  No drainage or polyps.  No sinus tenderness.   Oropharynx: Moist mucus membranes without lesions or thrush.  Good dentition.  Posterior pharynx clear.  Mallampati Grade IV airway.   Respiratory:   Crackles over mid to lower lung fields. Easy work of breathing without accessory muscle use.     Cardiovascular:  Regular rate and rhythm. S1 and S2 normal. No murmur, rub  or gallop.  Pulses intact and symmetric.  No peripheral cyanosis or edema.   Gastrointestinal:   Abdomen protuberant.    Musculoskeletal: No tenderness or deformity of the chest wall.  Joints normal.   No clubbing.   Skin: No rashes, lesions, skin changes.   Heme/Lymph: No cervical, supraclavicular, submandibular, or suprasternal adenopathy.  No bruising or petechiae.   Neurologic: Alert and oriented x3. No focal neurological deficits.  Normal gait.      Diagnostic Review:   Pulmonary Functions Testing Results:    Measures are suggestive of moderate restriction. Values are decreased as compared to all prior testing done in past year.      Chest CT (02/02/18): Images personally reviewed. Bronchiectasis predominantly in bases of lingula, right middle lobe, and bilateral lower lobes (left greater than right), mild to moderately progressed since 03/30/2017. ?? New and increased nodular and tree-in-bud opacities, now involving all lobes. Patchy consolidations in the bases of the lingula, and lower lobes also stable to mildly increased. Also new left upper lobe nodular opacity with adjacent groundglass, 8 mm, (2:26). No definite reticular opacities, or honeycombing identified. No appreciable air trapping on expiratory imaging.    Chest CT (03/30/17): Images personally reviewed. Interval worsening of bilateral lower lobe bronchiectasis and bronchial wall thickening with new lingular bronchiectasis. Increased clustered groundglass opacities with areas of consolidation in the lower lobes and lingula. Findings may represent sequela of endobronchial infection or impaired mucus clearance.  Bronchiectasis has been present on CT imaging since 06/12/11.    Cultures   Source Bacterial Culture AFB Smear AFB Culture   03/25/17 Sputum 3+ OPF, 3+ Stenotrophomonas negative negative   06/29/17 Sputum OPF negative Overgrown with bacteria/fungus   01/28/18 Sputum 2+ OPF, 2+ GNR glucose non-fermenter, Scedosporium negative 2 morphologies Streptomyces   02/01/18 Sputum 3+ OPF, 1+ GNR glucose non-fermenter, Scedosporium negative Streptomyces   03/02/18 Sputum 3+ OPF, 3+ GNR glucose non-fermenter, Scedosporium negative NGTD            Bronchiectasis Evaluation:  IgG with subclasses:   Lab Results   Component Value Date/Time    IGG 818 01/17/2013 10:52 AM     IgA:   Lab Results   Component Value Date/Time    IGA 109 01/17/2013 10:52 AM     IgM:   Lab Results   Component Value Date/Time    IGM 25 (L) 01/17/2013 10:52 AM     IgE:   Lab Results   Component Value Date/Time    IGE 20.9 03/30/2017 11:41 AM    IGE 34.7 11/24/2013 12:34 PM     Specific Titers: Normal Pneumococcal Antibody Titers (07/10/2010, 10/09/2010), Dipheria, and Tetanus (07/10/2010).  HIV:   Lab Results   Component Value Date/Time    HIVAGAB Nonreactive 01/17/2013 10:52 AM     Neutrophil function (08/07/2010): Normal. Not consistent with CGD.    Autoimmune: ANA (08/26/10) - negative.    ANCA:   Lab Results   Component Value Date/Time    PR3ELISA Negative 03/30/2017 11:41 AM    PR3QT 6.4 03/30/2017 11:41 AM  MPO Negative 03/30/2017 11:41 AM    MPOQT 2.2 03/30/2017 11:41 AM     CBC-D:   Lab Results   Component Value Date/Time    WBC 7.5 03/01/2018 10:56 AM    WBC 6.4 01/04/2015 10:32 AM    HGB 15.2 03/01/2018 10:56 AM    HGB 15.9 01/04/2015 10:32 AM    HCT 46.6 03/01/2018 10:56 AM    HCT 45.9 01/04/2015 10:32 AM    PLT 280 03/01/2018 10:56 AM    PLT 223 01/04/2015 10:32 AM    NEUTROABS 5.9 03/01/2018 10:56 AM    NEUTROABS 5.0 03/21/2014 08:36 AM    EOSABS 0.1 03/01/2018 10:56 AM    EOSABS 0.3 03/21/2014 08:36 AM    BANDS 1+ 07/18/2013 12:20 PM     Alpha-1:         CF testing: Sweat test at [facility] (08/19/10): 11mmol/L - not c/w CF.  PCD testing: nNO (09/10/11): 337.13 nl/min Normal and not c/w PCD    Health Care Maintenance:  Most Recent Immunizations   Administered Date(s) Administered   ??? Hep A / Hep B 08/26/2010   ??? Hepatitis B, Adult 09/16/2010   ??? INFLUENZA TIV (TRI) PF (IM) 11/17/2011   ??? Influenza Vaccine Quad (IIV4 PF) 46mo+ injectable 09/22/2016   ??? Influenza Vaccine Quad (IIV4 W/PRESERV) 76MO+ 09/26/2013   ??? Influenza Virus Vaccine, unspecified formulation 10/08/2017   ??? PNEUMOCOCCAL POLYSACCHARIDE 23 11/23/2017   ??? Pneumococcal Conjugate 13-Valent 11/24/2013   ??? TdaP 12/22/2008

## 2018-03-07 NOTE — Unmapped (Signed)
Cholesterol medication is safe for the liver.    Continue efforts for diabetes control.    Continue efforts to lose weight.    We will forgo liver biopsy for now.    I will see you in 6 months.

## 2018-03-07 NOTE — Unmapped (Signed)
[facility], [place]        Referring Provider:  Norvel Richards, MD  [address]  [place], [place] [address]     Primary Care Provider:  Danielle Dess, MD    Other Specialist(s):         PATIENT PROFILE:        Brett Bowens Sr. is a 52 y.o. male (DOB: 01-15-1966) who is seen in consultation at the request of Dr. Rosita Fire for evaluation of hepatic steatosis with possible A1AT disease.         ASSESSMENT:        52 year old WM with chronic pulmonary infections and bronchiectasis.  He was tested for A1AT in 2011 and has one copy of the Z allele plus the wild type allele. This combination may predispose him to the pulmonary manifestations of A1AT, but usually patients need to be homozygous for ZZ to have liver disease.    [name] does have evidence of steatosis on imaging and has multiple metabolic syndrome risk factors.  Fortunately, his LFTs and liver synthetic function are normal.  His platelets are also normal.    We discussed pros and cons of liver biopsy and will defer for now as there is no intervention for liver A1AT and he is already making efforts to lose weight.      PLAN:       -cholesterol medication is safe for the liver  -We discussed the role of diet and exercise in the treatment of nonalcoholic fatty liver disease.  Multiple diets that showed efficacy and weight loss in the treatment of nonalcoholic fatty liver disease, thus I do not recommend one specific diet, however overall reduced calorie diet instead.  Exercise recommendations include a goal of 30-45 minutes of aerobic exercise 5 times a week.  I stressed that this is the goal rather than a starting point however and a prudent increase in physical activity is necessary.  -We discussed our multidisciplinary approach to nonalcoholic fatty liver disease including the use of our clinical psychologist to assist with motivation and adherence techniques, as well as a nutritionist to help with implementing healthy eating choices. He is not particularly interested in these interventions at this time.  -RTC 6 months        CHIEF COMPLAINT: Liver steatosis    HISTORY OF PRESENT ILLNESS: This is a 52 y.o. year old male with chronic pulmonary infections and bronchectasis.  He is Z/- for A1AT.  He has several facets of the metabolic syndrome (BMI, lipids, DM) and steatosis on imaging.  He does not drink ETOH. No jaundice, encephalopathy, ascites or GI bleeding.  He has recently be trying to lose weight and get better control of his diabetes.  He has stopped taking his cholesterol medication.       REVIEW OF SYSTEMS:     The balance of 12 systems reviewed is negative except as noted in the HPI.     PAST MEDICAL HISTORY:    Past Medical History:   Diagnosis Date   ??? Allergic rhinitis    ??? Alpha-1-antitrypsin deficiency carrier (CMS-HCC)     with slightly low levels   ??? Amblyopia    ??? Asthma    ??? Asthma, extrinsic    ??? Bronchiectasis (CMS-HCC) 06/12/2011   ??? Chronic sinusitis    ??? Colon polyps    ??? Diabetes mellitus (CMS-HCC)    ??? GERD (gastroesophageal reflux disease)    ??? Impaired  glucose tolerance    ??? Obesity    ??? Pneumonia    ??? Pseudomonas respiratory infection    ??? 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 Sleep apnea    ??? Strabismus    ??? Strep throat        PAST SURGICAL HISTORY:    Past Surgical History:   Procedure Laterality Date   ??? BRONCHOSCOPY     ??? LUNG BIOPSY     ??? PR BRONCHOSCOPY,DIAGNOSTIC W BRUSH Bilateral 08/21/2013    Procedure: BRONCHOSCOPY, RIGID OR FLEXIBLE, INCLUDING FLOURO GUIDED; DIAGNOSTIC, WITH BRUSHING OR PROTECTED BRUSHINGS;  Surgeon: Nash Dimmer, MD;  Location: [facility];  Service: Pulmonary   ??? PR BRONCHOSCOPY,DIAGNOSTIC W LAVAGE Bilateral 08/21/2013    Procedure: BRONCHOSCOPY, RIGID OR FLEXIBLE, INCLUDE FLUOROSCOPIC GUIDANCE WHEN PERFORMED; W/BRONCHIAL ALVEOLAR LAVAGE;  Surgeon: Nash Dimmer, MD;  Location: [facility];  Service: Pulmonary   ??? PR COLONOSCOPY FLX DX W/COLLJ SPEC WHEN PFRMD N/A 03/21/2015    Procedure: COLONOSCOPY, FLEXIBLE, PROXIMAL TO SPLENIC FLEXURE; DIAGNOSTIC, W/WO COLLECTION SPECIMEN BY BRUSH OR WASH;  Surgeon: Brendia Sacks, MD;  [facility]: [facility];  Service: Gastroenterology   ??? SHOULDER SURGERY Right        MEDICATIONS:      Current Outpatient Medications:   ???  acetaminophen (TYLENOL EXTRA STRENGTH) 500 MG tablet, Take 1 tablet by mouth as needed for pain, Disp: , Rfl:   ???  albuterol (PROVENTIL HFA;VENTOLIN HFA) 90 mcg/actuation inhaler, Inhale 2 puffs every four (4) hours as needed for wheezing., Disp: 1 Inhaler, Rfl: 11  ???  albuterol 2.5 mg /3 mL (0.083 %) nebulizer solution, Inhale 3 mL (2.5 mg total) by nebulization Two (2) times a day. And every 6 hours as needed for SOB/Wheezing., Disp: 540 mL, Rfl: 11  ???  alcohol swabs PadM, Use as directed with inhaled antibiotics, Disp: 100 each, Rfl: 0  ???  aztreonam lysine (CAYSTON) 75 mg/mL Nebu nebulization solution, Inhale 1 mL (75 mg total) Three (3) times a day. 28 days on and 28 days off., Disp: 84 mL, Rfl: 5  ???  cetirizine (ZYRTEC) 10 MG tablet, Take 1 tablet (10 mg total) by mouth daily., Disp: 90 tablet, Rfl: 3  ???  cholecalciferol, vitamin D3, (VITAMIN D3) 2,000 unit cap, Take 1 capsule (2,000 Units total) by mouth daily., Disp: 30 capsule, Rfl: prn  ???  colistimethate (COLYMYCIN) 150 mg injection, Inhale 2 mL (150 mg total) Two (2) times a day. Inject 2mL SWFI into colistin vial, then withdraw 2mL (150mg ) for dose., Disp: 60 vial, Rfl: 5  ???  fexofenadine (ALLEGRA) 180 MG tablet, Take 1 tablet (180 mg total) by mouth daily., Disp: 90 tablet, Rfl: 3  ???  fluticasone (FLONASE) 50 mcg/actuation nasal spray, USE 2 SPRAYS IN EACH NOSTRIL DAILY (Patient taking differently: USE 2 SPRAYS IN EACH DAILY AS NEEDED FOR RHINITIS), Disp: 16 g, Rfl: 5  ???  fluticasone-salmeterol (ADVAIR HFA) 230-21 mcg/actuation inhaler, Inhale 2 puffs Two (2) times a day., Disp: 1 Inhaler, Rfl: 11  ???  metFORMIN (GLUCOPHAGE-XR) 500 MG 24 hr tablet, Take 2 tablets (1,000 mg total) by mouth every morning before breakfast., Disp: 180 tablet, Rfl: 3  ???  MISCELLANEOUS MEDICAL SUPPLY MISC, Frequency:PHARMDIR   Dosage:0.0     Instructions:  Note:CPAP 15 cm H20 with heated humidity for nasal dryness.  Mask: ResMed Quattro size Medium. Dx OSA. Dose: 1, Disp: , Rfl:   ???  montelukast (SINGULAIR) 10  mg tablet, Take 1 tablet (10 mg total) by mouth nightly., Disp: 90 tablet, Rfl: 3  ???  nystatin (MYCOSTATIN) 100,000 unit/mL suspension, Take 5 mL by mouth Four (4) times a day. , Disp: , Rfl:   ???  omeprazole (PRILOSEC) 20 MG capsule, Take 1 capsule (20 mg total) by mouth Two (2) times a day. (Patient taking differently: Take 40 mg by mouth daily. ), Disp: 180 capsule, Rfl: 3  ???  PARI LC D NEBULIZER Misc, Provide 1 device  for use with inhaled medication., Disp: 1 each, Rfl: 5  ???  rizatriptan (MAXALT) 10 MG tablet, Take one tablet at onset of migraine.  May repeat in  2 hours., Disp: 10 tablet, Rfl: 12  ???  sertraline (ZOLOFT) 50 MG tablet, Take 1 tablet (50 mg total) by mouth daily., Disp: 90 tablet, Rfl: 3  ???  sodium chloride 7% 7 % Nebu, INHALE 3 ML VIA NEBUZLIER TWICE DAILY (Patient taking differently: INHALE 4 ML VIA NEBUZLIER TWICE DAILY), Disp: 540 mL, Rfl: PRN  ???  sterile water, PF, Soln, Add 1mL SWFI into neb cup along with colistin., Disp: 60 ampule, Rfl: 5  ???  syringe with needle (SYRINGE 3CC/20GX1) 3 mL 20 gauge x 1 Syrg, For use with inhaled antibiotic, Disp: 60 Syringe, Rfl: 5  ???  topiramate (TOPAMAX) 100 MG tablet, Take 1 tablet (100 mg total) by mouth nightly., Disp: 90 tablet, Rfl: 3    ALLERGIES:    Codeine; Rifampin; and Daliresp [roflumilast]    SOCIAL HISTORY:    Social History     Socioeconomic History   ??? Marital status: Married     Spouse name: Lupita Leash   ??? Number of children: 3   ??? Years of education: None   ??? Highest education level: None   Occupational History     Employer: DISABLED   Social Needs   ??? Financial resource strain: None   ??? Food insecurity:     Worry: None     Inability: None   ??? Transportation needs:     Medical: None     Non-medical: None   Tobacco Use   ??? Smoking status: Never Smoker   ??? Smokeless tobacco: Former Estate agent and Sexual Activity   ??? Alcohol use: No     Alcohol/week: 0.0 oz   ??? Drug use: No   ??? Sexual activity: Yes     Partners: Female   Lifestyle   ??? Physical activity:     Days per week: None     Minutes per session: None   ??? Stress: None   Relationships   ??? Social connections:     Talks on phone: None     Gets together: None     Attends religious service: None     Active member of club or organization: None     Attends meetings of clubs or organizations: None     Relationship status: None   Other Topics Concern   ??? Do you use sunscreen? Not Asked   ??? Tanning bed use? Not Asked   ??? Are you easily burned? Not Asked   ??? Excessive sun exposure? Not Asked   ??? Blistering sunburns? Not Asked   Social History Narrative    Hasn't yet required services for disability. 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       FAMILY HISTORY:    family history includes Colon cancer in his father; Diabetes in his paternal grandfather;  Emphysema in his maternal uncle; Heart disease in his maternal uncle.      VITAL SIGNS:    BP 130/78  - Pulse 69  - Temp 36.3 ??C (97.3 ??F) (Temporal)  - Wt 88.1 kg (194 lb 4.8 oz)  - SpO2 95%  - BMI 31.64 kg/m??   Body mass index is 31.64 kg/m??.    PHYSICAL EXAM:    Normal comprehensive exam:      Constitutional:   Alert, oriented x 3, no acute distress, well nourished   Mental Status:   Thought organized, appropriate affect, normal fluent speech.   HEENT:   PEERL, conjunctiva clear, anicteric, oropharynx clear, neck supple, no LAD.   Respiratory: Clear to auscultation, and percussion to the bases, unlabored breathing.     Cardiac: Regular rate and rhythm normal S1 and S2, no murmur.      Abdomen: Soft, non-distended, non-tender, no organomegaly or masses.     Perianal/Rectal Exam Not performed.     Extremities:   No edema, well perfused.   Musculoskeletal: No joint swelling or tenderness noted, no deformities.     Skin: No rashes, jaundice or skin lesions noted.     Neuro: No focal deficits.          DIAGNOSTIC STUDIES:  I have reviewed all pertinent diagnostic studies, including:      Radiographic studies:    reviewed  Laboratory results:    INTERPRETATION:  This patient is heterozygous for the alpha-1-antitrypsin Z mutation and negative  for the S mutation, a genotype that is not generally associated with  alpha-1-antitrypsin deficiency. ??(See comment.)    COMMENT:  This result does not exclude a diagnosis of alpha-1-antitrypsin deficiency,  particularly given the reportedly low level of alpha-1-antitrypsin by serum  immunochemistry and the possibility of alternate, less common mutations in the  SERPINA1 gene not tested by this assay. (http://www.geneclinics.org)    Appointment on 03/02/2018   Component Date Value Ref Range Status   ??? Lower Respiratory Culture 03/02/2018 3+ Oropharyngeal Flora Isolated   Preliminary   ??? Lower Respiratory Culture 03/02/2018 [place]*  Preliminary   ??? Lower Respiratory Culture 03/02/2018 3+ Gram Negative Rod*  Preliminary   ??? Gram Stain 03/02/2018 >25 PMNS/LPF   Preliminary   ??? Gram Stain 03/02/2018 <10  Epithelial cells/LPF   Preliminary   ??? Gram Stain 03/02/2018 2+ Smear Results Suggest Mixed oral flora   Preliminary   ??? Gram Stain 03/02/2018 Acceptable for culture   Preliminary   Office Visit on 03/01/2018   Component Date Value Ref Range Status   ??? Fungitell 03/01/2018 <31  <80 pg/mL Final   ??? Sodium 03/01/2018 142  135 - 145 mmol/L Final   ??? Potassium 03/01/2018 4.5  3.5 - 5.0 mmol/L Final   ??? Chloride 03/01/2018 105  98 - 107 mmol/L Final   ??? CO2 03/01/2018 26.0  22.0 - 30.0 mmol/L Final   ??? BUN 03/01/2018 13  7 - 21 mg/dL Final   ??? Creatinine 03/01/2018 0.79  0.70 - 1.30 mg/dL Final   ??? BUN/Creatinine Ratio 03/01/2018 16   Final   ??? EGFR MDRD Non Af Amer 03/01/2018 >=60  >=60 mL/min/1.7m2 Final   ??? EGFR MDRD Af Amer 03/01/2018 >=60  >=60 mL/min/1.31m2 Final   ??? Anion Gap 03/01/2018 11  9 - 15 mmol/L Final   ??? Glucose 03/01/2018 126  65 - 179 mg/dL Final   ??? Calcium 13/06/6577 9.4  8.5 - 10.2 mg/dL Final   ??? Albumin  03/01/2018 4.6  3.5 - 5.0 g/dL Final   ??? Total Protein 03/01/2018 7.6  6.5 - 8.3 g/dL Final   ??? Total Bilirubin 03/01/2018 0.6  0.0 - 1.2 mg/dL Final   ??? AST 16/08/9603 27  19 - 55 U/L Final   ??? ALT 03/01/2018 35  19 - 72 U/L Final   ??? Alkaline Phosphatase 03/01/2018 96  38 - 126 U/L Final   ??? WBC 03/01/2018 7.5  4.5 - 11.0 10*9/L Final   ??? RBC 03/01/2018 5.36  4.50 - 5.90 10*12/L Final   ??? HGB 03/01/2018 15.2  13.5 - 17.5 g/dL Final   ??? HCT 54/07/8118 46.6  41.0 - 53.0 % Final   ??? MCV 03/01/2018 87.0  80.0 - 100.0 fL Final   ??? MCH 03/01/2018 28.4  26.0 - 34.0 pg Final   ??? MCHC 03/01/2018 32.6  31.0 - 37.0 g/dL Final   ??? RDW 14/78/2956 14.2  12.0 - 15.0 % Final   ??? MPV 03/01/2018 7.9  7.0 - 10.0 fL Final   ??? Platelet 03/01/2018 280  150 - 440 10*9/L Final   ??? Neutrophils % 03/01/2018 79.4  % Final   ??? Lymphocytes % 03/01/2018 11.4  % Final   ??? Monocytes % 03/01/2018 6.0  % Final   ??? Eosinophils % 03/01/2018 1.4  % Final   ??? Basophils % 03/01/2018 0.6  % Final   ??? Absolute Neutrophils 03/01/2018 5.9  2.0 - 7.5 10*9/L Final   ??? Absolute Lymphocytes 03/01/2018 0.9* 1.5 - 5.0 10*9/L Final   ??? Absolute Monocytes 03/01/2018 0.5  0.2 - 0.8 10*9/L Final   ??? Absolute Eosinophils 03/01/2018 0.1  0.0 - 0.4 10*9/L Final   ??? Absolute Basophils 03/01/2018 0.0  0.0 - 0.1 10*9/L Final   ??? Large Unstained Cells 03/01/2018 1  0 - 4 % Final   ??? AFB Smear 03/02/2018 NO ACID FAST BACILLI SEEN- 3 negative smears do not exclude pulmonary TB. If active pulmonary TB is suspected, continue airborne isolation until pulmonary disease is excluded by negative cultures.  No Acid Fast Bacilli Seen Final

## 2018-03-08 ENCOUNTER — Encounter
Admit: 2018-03-08 | Discharge: 2018-03-08 | Payer: PRIVATE HEALTH INSURANCE | Attending: Internal Medicine | Primary: Internal Medicine

## 2018-03-08 ENCOUNTER — Encounter
Admit: 2018-03-08 | Discharge: 2018-03-08 | Payer: PRIVATE HEALTH INSURANCE | Attending: Student in an Organized Health Care Education/Training Program | Primary: Student in an Organized Health Care Education/Training Program

## 2018-03-08 ENCOUNTER — Encounter: Admit: 2018-03-08 | Discharge: 2018-03-08 | Payer: PRIVATE HEALTH INSURANCE

## 2018-03-08 DIAGNOSIS — J310 Chronic rhinitis: Principal | ICD-10-CM

## 2018-03-08 DIAGNOSIS — J471 Bronchiectasis with (acute) exacerbation: Principal | ICD-10-CM

## 2018-03-08 DIAGNOSIS — J479 Bronchiectasis, uncomplicated: Secondary | ICD-10-CM

## 2018-03-08 DIAGNOSIS — G4733 Obstructive sleep apnea (adult) (pediatric): Secondary | ICD-10-CM

## 2018-03-08 LAB — ASPERGILLUS AG SERUM: Lab: <0.5

## 2018-03-08 MED ORDER — SULFAMETHOXAZOLE 400 MG-TRIMETHOPRIM 80 MG TABLET
ORAL_TABLET | Freq: Two times a day (BID) | ORAL | 0 refills | 0.00000 days | Status: CP
Start: 2018-03-08 — End: 2018-06-10

## 2018-03-08 MED ORDER — SULFAMETHOXAZOLE 400 MG-TRIMETHOPRIM 80 MG TABLET: 1 | tablet | 0 refills | 0 days

## 2018-03-08 MED FILL — SULFAMETHOXAZOLE/TRIMETHO/400-80MG/TABS: SULFAMETHOXAZOLE/TRIMETHO/400-80MG/TABS | 21 days supply | Qty: 42 | Fill #0

## 2018-03-08 NOTE — Unmapped (Signed)
Pt is on [place] this month.  [facility] LPN

## 2018-03-08 NOTE — Unmapped (Signed)
Note sent to [facility] re: CPAP    Good morning Deb (Panza),    [name] was in clinic this morning.  He told Dr. Garner Nash that he has been having variable flows from his CPAP machine, that sometimes the humidifier chamber is completely full when he wakes up and sometimes it is empty.  He said that it has been acting funny and he has had to unplug it and then plug it in and restart it.    [name] thinks that his machine is about 53 years old.  Can someone assess his machine?  He is ordered to have a repeat sleep study-FYI  Does [facility] offer  CPAP cleaners like SoClean?    Isabella Bowens Sr.  MRN:   [id number]  DOB:   [birth year]

## 2018-03-08 NOTE — Unmapped (Signed)
Otolaryngology Clinic Note    Brett Bowens Sr. is a 52 y.o. male is seen in consultation at the request of Referred Self  for evaluation of chronic sinusitis.        History of Present Illness:     The patient is a 52 y.o. 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 who presents for the evaluation of CRS.  Patient sees Dr. Reuel Boom for bronchiectasis 2/2 alpha-1 antitrypsin carrier state with prior NTM infection and hx of OSA.     Patient reports multiply recurrent sinus infections as well as pulmonary infections.  Is currently on a course of Augmentin for 14 days.  He started this on Tuesday.    Reports chronic nasal obstruction that is bilateral in nature.  This makes wearing his CPAP difficult however he does wear it every night.  Reports in general the settings are high but he does not know what the numbers are.  Report he frequently wakes up with a completely dry mouth.  Sinus standpoint his wife reports his sense of smell is generally intact.  Thick chronic drainage from his nose.  This is usually clear but during exacerbations can become cloudy.  Reports diffuse facial pressure.  He has never had sinus surgery.  He has never had significant facial trauma.  He is currently on disability but [place].    Patient reports is been unable to tolerate a Neomed bottle.  He reports he has lots of ear pressure following this and gets frequent ear infections.    Update 03/08/2018:  [name] is a 52 y.o. male who last seen in 01/14/2018.  He reports he  has been insistent bilateral nasal obstruction sinus pressure lots of postnasal drip.  Reports he has been irrigating his nose with minimal improvement.  Reports he takes multiple allergy medicines daily.  He is being worked up for some worsening self-limited hemoptysis and wheezing Dr. Garner Nash.  Is been started on a course of Augmentin for 3 weeks based on previous cultures.  She is also obtaining a CT scan.    From a nasal standpoint the patient reports that this takes second priority to his recent increasing shortness of breath however it is very bothersome to him.  CT scan last week.  Been in for follow-up.    A 12 point review of systems was negative except as indicated.  The patient denies fevers, chills, shortness of breath, chest pain, nausea, vomiting, diarrhea, inability to lie flat, dysphagia, odynophagia, hemoptysis, hematemesis, changes in vision, changes in voice quality, otalgia, otorrhea, vertiginous symptoms, focal deficits, or other concerning symptoms.    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    Past Surgical History     has a past surgical history that includes Bronchoscopy; Lung biopsy; pr bronchoscopy,diagnostic w lavage (Bilateral, 08/21/2013); pr bronchoscopy,diagnostic w brush (Bilateral, 08/21/2013); Shoulder surgery (Right); and pr colonoscopy flx dx w/collj spec when pfrmd (N/A, 03/21/2015).  Current Medications    Current Outpatient Medications   Medication Sig Dispense Refill   ??? acetaminophen (TYLENOL EXTRA STRENGTH) 500 MG tablet Take 1 tablet by mouth as needed for pain     ??? albuterol (PROVENTIL HFA;VENTOLIN HFA) 90 mcg/actuation inhaler Inhale 2 puffs every four (4) hours as needed for wheezing. 1 Inhaler 11   ??? albuterol 2.5 mg /3 mL (0.083 %) nebulizer solution Inhale 3 mL (2.5 mg total) by nebulization Two (2) times a day. And every 6 hours as needed for SOB/Wheezing. 540 mL 11   ??? alcohol swabs PadM Use as directed with inhaled antibiotics 100 each 0   ??? aztreonam lysine (CAYSTON) 75 mg/mL Nebu nebulization solution Inhale 1 mL (75 mg total) Three (3) times a day. 28 days on and 28 days off. 84 mL 5   ??? Bifidobacterium infantis (ALIGN) 4 mg cap Take 4 mg by mouth.     ??? cetirizine (ZYRTEC) 10 MG tablet Take 1 tablet (10 mg total) by mouth daily. 90 tablet 3   ??? cholecalciferol, vitamin D3, (VITAMIN D3) 2,000 unit cap Take 1 capsule (2,000 Units total) by mouth daily. 30 capsule prn   ??? colistimethate (COLYMYCIN) 150 mg injection Inhale 2 mL (150 mg total) Two (2) times a day. Inject 2mL SWFI into colistin vial, then withdraw 2mL (150mg ) for dose. 60 vial 5   ??? fexofenadine (ALLEGRA) 180 MG tablet Take 1 tablet (180 mg total) by mouth daily. 90 tablet 3   ??? fluticasone (FLONASE) 50 mcg/actuation nasal spray USE 2 SPRAYS IN EACH NOSTRIL DAILY (Patient taking differently: USE 2 SPRAYS IN EACH DAILY AS NEEDED FOR RHINITIS) 16 g 5   ??? fluticasone-salmeterol (ADVAIR HFA) 230-21 mcg/actuation inhaler Inhale 2 puffs Two (2) times a day. 1 Inhaler 11   ??? metFORMIN (GLUCOPHAGE-XR) 500 MG 24 hr tablet Take 2 tablets (1,000 mg total) by mouth every morning before breakfast. 180 tablet 3   ??? MISCELLANEOUS MEDICAL SUPPLY MISC Frequency:PHARMDIR   Dosage:0.0     Instructions:  Note:CPAP 15 cm H20 with heated humidity for nasal dryness.  Mask: ResMed Quattro size Medium. Dx OSA. Dose: 1     ??? montelukast (SINGULAIR) 10 mg tablet Take 1 tablet (10 mg total) by mouth nightly. 90 tablet 3   ??? nystatin (MYCOSTATIN) 100,000 unit/mL suspension Take 5 mL by mouth Four (4) times a day.      ??? omeprazole (PRILOSEC) 20 MG capsule Take 1 capsule (20 mg total) by mouth Two (2) times a day. (Patient taking differently: Take 40 mg by mouth daily. ) 180 capsule 3   ??? PARI LC D NEBULIZER Misc Provide 1 device  for use with inhaled medication. 1 each 5   ??? rizatriptan (MAXALT) 10 MG tablet Take one tablet at onset of migraine.  May repeat in  2 hours. 10 tablet 12   ??? sertraline (ZOLOFT) 50 MG tablet Take 1 tablet (50 mg total) by mouth daily. 90 tablet 3   ??? sod bicarb-sod chlor-neti pot (AYR SALINE NASAL NETI RINSE) pkdv Use twice per day     ??? sodium chloride 7% 7 % Nebu INHALE 3 ML VIA NEBUZLIER TWICE DAILY (Patient taking differently: INHALE 4 ML VIA NEBUZLIER TWICE DAILY) 540 mL PRN   ??? sterile water, PF, Soln Add 1mL SWFI into neb cup along with colistin. 60 ampule 5   ??? sulfamethoxazole-trimethoprim (BACTRIM) 400-80 mg per tablet Take 1 tablet (80 mg of trimethoprim total) by mouth Two (2) times  a day. for 21 days 42 tablet 0   ??? syringe with needle (SYRINGE 3CC/20GX1) 3 mL 20 gauge x 1 Syrg For use with inhaled antibiotic 60 Syringe 5   ??? topiramate (TOPAMAX) 100 MG tablet Take 1 tablet (100 mg total) by mouth nightly. 90 tablet 3     No current facility-administered medications for this visit.        Allergies    Allergies   Allergen Reactions   ??? Codeine Shortness Of Breath and Rash   ??? Rifampin      Fevers, chills.   ??? Daliresp [Roflumilast] Other (See Comments)     Back pain       Family History    Negative for bleeding disorders or free bleeding.     family history includes Colon cancer in his father; Diabetes in his paternal grandfather; Emphysema in his maternal uncle; Heart disease in his maternal uncle.    Social History:     reports that he has never smoked. He has quit using smokeless tobacco.   reports that he does not drink alcohol.   reports that he does not use drugs.    Review of Systems    A 12 system review of systems was performed and is negative other than that noted in the history of present illness.    Vital Signs  Height 166.9 cm (5' 5.71), weight 89.1 kg (196 lb 6.4 oz).    RSDI today was 53 and previously 3.      Physical Exam    General: Well-developed, well-nourished. Appropriate, comfortable, and in no apparent distress.  Head/Face: On external examination there is no obvious asymmetry or scars. On palpation there is no tenderness over maxillary sinuses or masses within the salivary glands. Cranial nerves V and VII are intact through all distributions.  Eyes: PERRL, EOMI, the conjunctiva are not injected and sclera is non-icteric.  Ears: On external exam, there is no obvious lesions or asymmetry. The EACs are bilaterally without cerumen or lesions. The TMs are in the neutral position and are mobile to pneumatic otoscopy bilaterally. There are no middle ear masses or fluid noted. Hearing is grossly intact bilaterally.  Nose: On external exam there are neither lesions nor asymmetry of the nasal tip/ dorsum. On anterior rhinoscopy, visualization posteriorly is limited on anterior examination. For this reason, to adequately evaluate posteriorly for masses, polypoid disease and/or signs of infections, nasal endoscopy is indicated (see procedure below).  Oral cavity/oropharynx: The mucosa of the lips, gums, hard and soft palate, posterior pharyngeal wall, tongue, floor of mouth, and buccal region are without masses or lesions and are normally hydrated. Good dentition. Tongue protrudes midline. Tonsils are normal appearing. Supraglottis not visualized due to gag reflex.  Neck: There is no asymmetry or masses. Trachea is midline. There is no enlargement of the thyroid or palpable thyroid nodules.   Lymphatics: There is no palpable lymphadenopathy along the jugulodiagastric, submental, or posterior cervical chains.  Chest: No audible wheeze, unlabored respirations.  Cardiovascular: Regular rate.  GI: Nondistended.  Neurologic: Cranial nerve???s II-XII are grossly intact. Exam is non-focal.  Extremities: No cyanosis, clubbing or edema.    Procedures:    Sinonasal Endoscopy (CPT [id number]): To better evaluate the patient???s symptoms, sinonasal endoscopy is indicated.  After discussion of risks and benefits, and topical decongestion and anesthesia, an endoscope was used to perform nasal endoscopy on each side. A time out identifying the patient, the procedure, the location of the procedure and any concerns was  performed prior to beginning the procedure.    Findings:   Copious clear mucus.  There is a long septal shelf along the inferior turbinate on the left.  Mucosa mildly inflamed.  There is no purulence.  No polyps or pus.      Examination on the right reveals a broad anterior septal deviation to the right.  This obstructs visualization of the middle turbinate.  The inflamed mucosa.  There is copious clear mucus.  There is no mucopurulence or no polyps or pus.      Oretha Ellis Nasal Endoscopy Score    Left        ?? Polyps:  Absent (0)   ?? Edema:   Mild (1)   ?? Discharge:  Clear, Thin (1)    ?? Scarring:  Absent (0)   ?? Crusting:  None (0)      Total Left:  3     Right         ?? Polyps:  Absent (0)   ?? Edema:  Mild (1)   ?? Discharge: Clear, Thin (1)    ?? Scarring:  Absent (0)   ?? Crusting:  None (0)      Total Right:   3      Labs and Diagnostic Tests  An MRI was reviewed from 2015 that was obtained for other reasons.  This demonstrates a broad right-sided septal deviation.  No substantial sinus disease was noted on this limited study.    CT scan was personally reviewed and gone over with the patient.  This demonstrates a prominent septal spur and shelf along the left side abutting the inferior turbinate.  On the right it shows substantial inferior turbinate hypertrophy with anterior deflection.      Assessment:  The patient is a 51 y.o. 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 who presents for the evaluation of: Nasal obstruction chronic sinusitis, septal deviation, instructive sleep apnea    Recommendations:  1. [name] has inflammation in the nasal cavity noted on nasal endoscopy exam today in clinic. We have extensively discussed treatment options.  Options included continued saline rinses possible intranasal steroids versus doing nothing versus bilateral septoplasty and inferior turbinate reduction.  At this point the patient is most interested in getting his hemoptysis resolved.  Starting a course of antibiotics and has a CT chest with Dr. Garner Nash.  We will plan on seeing him back in 6 months.  We could get substantial improvement in his nasal breathing to help improve his compliance with the CPAP with a septoplasty however this would require general anesthesia with intubation for likely 90 minutes.  We will see how his lung function continues to progress and further discuss this when we see him back.  I provided with my contact information if he had any concerns prior to her next visit    The patient voiced complete understanding of plan as detailed above and is in full agreement.

## 2018-03-08 NOTE — Unmapped (Signed)
Bronchiectasis Clinic Assessment    Inhaled Medications:     Advair HFA +  Albuterol Neb +  Albuterol HFA+   Cayston+    Hypertonic Saline 7%    Home O2: NA    Review Ordered Medications that are Taken, Frequency, Compliance: I reviewed techniques and order of treatments.  All questions answered.     Barriers to Compliance and Solutions: NA    Airway Clearance Used:    Vest he is trying to use twice per day.  Patient states no issues or questions at this time.  We discussed using it in different positions, he states that he coughs a lot when he bends over, I mentioned that he can utilize postural drainage in conjunction with the vest.  We discussed what postural drainage is and its benefits.     Airway Clearance Not Effective: NA    Cough Techniques:  ACBT and  Huff coughing  reviewed with patient.      Spacer/MDI Training: Patient states understanding of how to use the spacer and why it is beneficial.  His other space isn't working after cleaning.  [name] was provided with another spacer.   No further questions at this time.    Bipap/Cpap;Settings if Use: He is replacing the filter, tubing and mask at regular intervals.  He thinks his CPAP is 53 years old.  He uses distilled water with the CPAP humidifier.  He is thinking about getting a cleaner and sanitizer, I referred him to contact [employer] (told him it was private pay and that [employer] would be able to provide more information, including warranty specifications).    Equipment Review/Cleaning: Equipment cleaning reviewed.  Cleaning education handout given to patient.  All questions answered. He gets a Pari neb cup from [facility] and is cleaning it in a baby bottle sterilizer with distilled water.      DME Provider:  [facility]    Misc. Notes: [name] says that he spends over an hour nebulizing at a time, twice a day.  We discussed that when he is feeling well and he is in a hurry, that he could remove the blue one-way valve from the HTS to speed-up the treatment.

## 2018-03-08 NOTE — Unmapped (Signed)
Repeating sleep study.    Repeating chest CT.  Bactrim for 21 days.    Symptoms of a bronchiectasis exacerbation: 48 hours of at least three of the following:  ?? Increased cough  ?? Change in volume or appearance of sputum  ?? Increased sputum purulence  ?? Worsening shortness of breath and/or exercise tolerance  ?? Fatigue and/or malaise  ?? Coughing up blood (hemoptysis)    If you are experiencing some of these symptoms:  ?? Increase the frequency and/or intensity of your airway clearance  ?? Try to submit a sputum sample for bacterial and AFB cultures (at [facility] or locally)  ?? Reach out to me or your local physician.  ?? If you need antibiotics, recommend treating for 14 days.    Please bring any new airway clearance equipment to your next visit to ensure that you are using and caring for it properly.    Thank you for allowing me to be a part of your care. Please call the clinic with any questions.    Viona Gilmore, MD, MPH  Pulmonary and Critical Care Medicine  [address]  [id number]  [place], [place] [address]    Thank you for your visit to the [facility] Pulmonary Clinics. You may receive a survey from [facility] regarding your visit today, and we are eager to use this feedback to improve your experience. Thank you for taking the time to fill it out.    Between appointments, you can reach [place] at these numbers:    For appointments or the Pulmonary Nurse: [phone]  Fax: [phone]    For urgent issues after hours:  Hospital Operator: [phone], ask for Pulmonary Fellow on call    For further information, check out the websites below:    [facility] for Bronchiectasis: [email or web address]    World Bronchiectasis Conference in [place] Patient session May 15, 2017: Videos can be watched here.     Bronchiectasis Toolbox (information about airway clearance): DiningCalendar.de    Copy for Individuals with Bronchiectasis and/or NTM through the Bronchiectasis Registry and the COPD Foundation: https://www.mckee-young.org/    Information about Non-tuberculous Mycobacterial Infections:  https://www.ntminfo.org     Interested in clinical trials and other research opportunities?  www.clinicaltrials.gov  The Rare Diseases Clinical Research Network [facility]) Genetic Disorders of  Mucociliary Clearance Consortium [facility]) Contact Registry is a way for patients with disorders of mucociliary clearance (such as bronchiectasis) and their family members to learn about research studies they may be able to join. Participation is completely voluntary and you may choose to withdraw at any time. There is no cost to join the [employer]. For more information or to join the registry please go to the following website:  [email or web address]

## 2018-03-12 NOTE — Unmapped (Signed)
Bronchiectasis Severity Index Score:    Age 53-69 = 2   BMI 18.5 or higher = 0   FEV1 % predicted 50-80% = 1   Hospitalized for severe exacerbation in past 2 years No = 0   Exacerbations in previous year 3 or more = 2   mMRC dyspnea score 3 = I stop for breath after walking about 100 yards or after a few minutes on level ground.   Chronic Pseudomonas colonization (at least 2 positive cultures at least 3 months apart within 1 year) No = 0   Colonization with other potential pathogenic bacteria (at least 2 positive cultures at least 3 months apart within 1 year) No = 0   Radiologic Severity 3 or more lobes or cystic brx = 1       Total Score 9     BSI score of 9+ = Severe bronchiectasis (1 year outcomes 7.6 - 10.5% mortality, 16.7 - 52.6% hospitalization rate; 4 year outcomes 9.9 - 29.2% mortality, 41.2 - 80.4% hospitalization)    Clinton Quant JD et al. The Bronchiectasis Severity Index: An International Derivation and Validation Study. AJRCCM, 2013; 189(5): C339114.)

## 2018-03-18 ENCOUNTER — Encounter: Admit: 2018-03-18 | Discharge: 2018-03-18 | Payer: PRIVATE HEALTH INSURANCE

## 2018-03-18 ENCOUNTER — Ambulatory Visit: Admit: 2018-03-18 | Discharge: 2018-03-18 | Payer: PRIVATE HEALTH INSURANCE

## 2018-03-18 DIAGNOSIS — J470 Bronchiectasis with acute lower respiratory infection: Secondary | ICD-10-CM

## 2018-03-18 DIAGNOSIS — Z148 Genetic carrier of other disease: Secondary | ICD-10-CM

## 2018-03-18 DIAGNOSIS — J479 Bronchiectasis, uncomplicated: Principal | ICD-10-CM

## 2018-03-18 DIAGNOSIS — K760 Fatty (change of) liver, not elsewhere classified: Secondary | ICD-10-CM

## 2018-03-18 DIAGNOSIS — E119 Type 2 diabetes mellitus without complications: Principal | ICD-10-CM

## 2018-03-18 MED ORDER — ATORVASTATIN 20 MG TABLET: 20 mg | each | 11 refills | 0 days

## 2018-03-18 MED ORDER — METFORMIN ER 500 MG TABLET,EXTENDED RELEASE 24 HR: tablet | 11 refills | 0 days

## 2018-03-18 MED ORDER — BLOOD-GLUCOSE METER
0 refills | 0 days
Start: 2018-03-18 — End: 2019-03-18

## 2018-03-18 MED ORDER — METFORMIN ER 500 MG TABLET,EXTENDED RELEASE 24 HR
ORAL_TABLET | Freq: Every day | ORAL | 11 refills | 0.00000 days | Status: CP
Start: 2018-03-18 — End: 2018-03-18

## 2018-03-18 MED ORDER — LANCETS 30 GAUGE
3 refills | 0 days
Start: 2018-03-18 — End: 2019-03-18

## 2018-03-18 MED ORDER — LANCETS
Freq: Every day | 3 refills | 0.00000 days | Status: CP
Start: 2018-03-18 — End: ?

## 2018-03-18 MED ORDER — ON CALL EXPRESS TEST STRIP
3 refills | 0 days
Start: 2018-03-18 — End: 2019-03-18

## 2018-03-18 MED ORDER — FREESTYLE LITE STRIPS
ORAL_STRIP | Freq: Every day | 3 refills | 0.00000 days | Status: CP
Start: 2018-03-18 — End: 2018-03-18

## 2018-03-18 MED ORDER — BLOOD-GLUCOSE METER KIT
0 refills | 0 days | Status: CP
Start: 2018-03-18 — End: ?

## 2018-03-18 MED ORDER — ATORVASTATIN 20 MG TABLET
ORAL_TABLET | Freq: Every day | ORAL | 3 refills | 0.00000 days | Status: CP
Start: 2018-03-18 — End: 2018-03-31

## 2018-03-18 MED FILL — METFORMIN ER/500MG ER/TAB: METFORMIN ER/500MG ER/TAB | 30 days supply | Qty: 90 | Fill #0

## 2018-03-18 MED FILL — ATORVASTATIN CALCIUM/20MG/TABS: ATORVASTATIN CALCIUM/20MG/TABS | 30 days supply | Qty: 30 | Fill #0

## 2018-03-18 NOTE — Unmapped (Addendum)
Working on healthy eating and trying to lose weight will help with your liver.    I want to check your liver in about a month to see how the cholesterol medication is going. Let me know if you notice any yellowing of your eyes or any other problems.    Increase the metformin to 3 pills a day    Add atorvastatin for cholesterol  Labwork done in about a month

## 2018-03-18 NOTE — Unmapped (Signed)
Internal Medicine Clinic Visit    Reason for visit: Follow-up T2DM, fatty liver    Assessment and Plans:    1. Type 2 diabetes mellitus without complication, without long-term current use of insulin (CMS-HCC)    2. Fatty liver    3. Bronchiectasis with acute lower respiratory infection (CMS-HCC)    4. Alpha-1-antitrypsin deficiency carrier (CMS-HCC)            #1. Type 2 DM. A1c increased from 7.8% 11/23/17 to 8.1% today. Increase metformin XR to 1500 mg daily. Discussed healthier eating habits. He would like to work on portion control. As blood sugars are increasing, will start having him check his fasting sugars at home.    #2. Fatty liver. Hepatic function panel after on lipitor for a month. Discussed importance of weight loss. Per hepatology, the alpha-1-carrier state rarely impacts the liver.    #3. Hyperlipidemia. Encouraged patient to work on healthy eating and weight loss. Start back on lipitor 20 mg daily. Recheck labs in one month. Discussed signs/symptoms of hepatoxicity. Discussed if these develop to stop the atorvastatin immediately and contact us.    #4. Bronchiectasis. Has had mild improvement in symptoms since starting the bactrim. Discussed importance of the chest physiotherapy. Following closely with pulm. Saw ID and working to determine if the species he has grown are infections or colonization. Hemoptysis has now resolved.     Return in 3 months or as needed.    __________________________________________________________    HPI:  Brett Hanna is a 52 year old male with past history of bronchiectasis, OSA, hyperlipidemia, T2DM, fatty liver, as well as others listed below who presents today for follow-up of T2DM and fatty liver.    Patient is currently on bactrim for lung infection. He believes this is helping with coughing and has not noticed blood in sputum. He is still wheezing. He has been doing sinus treatments every other day as the treatments bother his ears if done every day. He is still using the vest to break up phlegm. He had not used it for about a week due to forgetting the cord at his mother's house but did use it twice this morning.     He has been trying to eat healthier by having smaller portions and more vegetables and fruits. He reports that he is down to 194 lbs. He drinks a lot of water and will have tea with stevia. His wife reports that he always has a bottle of water with him. When out to eat he will drink either diet soda or water. He states that he does eat sweets sometimes.     Doesn't check his blood sugars because his meter broke.  __________________________________________________________    Problem List:  Patient Active Problem List   Diagnosis   ??? Allergic rhinitis   ??? Alpha-1-antitrypsin deficiency carrier (CMS-HCC)   ??? Benign neoplasm of colon   ??? Bronchiectasis (CMS-HCC)   ??? Hyperlipidemia   ??? Obstructive sleep apnea syndrome   ??? Chronic sinusitis   ??? Vitamin D deficiency   ??? Type 2 diabetes mellitus without complication, without long-term current use of insulin (CMS-HCC)   ??? Uncomplicated severe persistent asthma   ??? Obesity, Class I, BMI 30-34.9   ??? Migraine with aura and without status migrainosus, not intractable   ??? Gastroesophageal reflux disease without esophagitis   ??? Flexural eczema   ??? Mycobacterium avium-intracellulare infection (CMS-HCC) - colonization   ??? Anxiety   ??? Fatty liver   ??? Cough  Medications:  Reviewed in EPIC  __________________________________________________________    Physical Exam:   Vital Signs:  Vitals:    03/18/18 0903   BP: 118/68   Pulse: 67   SpO2: 93%   Weight: 88.2 kg (194 lb 8 oz)   Height: 166.9 cm (5' 5.71)   Eyes: anicteric  OP: MMM, no sublingual jaundice  Gen: Well appearing, NAD  CV: RRR, no murmurs  Pulm: course bs bilat. Moderate air movement.  Abd: distended, soft, nontender. + bs  Skin: no jaundice    A1C 8.1 today  Reviewed pulm notes  Reviewed respiratory therapy notes  Reviewed ID notes  Reviewed hepatology note Reviewed labs from 03/01/18  Reviewed sputum culture results    History also obtained from his wife.    I, Brett Hanna, scribed this note in the presence of Brett Dess, Brett Hanna.    The above service was scribed by Brett Hanna on my behalf and I attest to the accuracy of the documentation.    Brett Dess, Brett Hanna

## 2018-03-19 NOTE — Unmapped (Signed)
Notified of results on mychart.

## 2018-03-29 MED FILL — ON CALL EXPRESS BLOOD GLU/EXPRESS/STRP: ON CALL EXPRESS BLOOD GLU/EXPRESS/STRP | 100 days supply | Qty: 100 | Fill #0

## 2018-03-29 MED FILL — ON CALL EXPRESS BLOOD GLU/METER/DEVI: ON CALL EXPRESS BLOOD GLU/METER/DEVI | 90 days supply | Qty: 1 | Fill #0

## 2018-03-29 MED FILL — ON CALL LANCETS/LANCETS/MISC: ON CALL LANCETS/LANCETS/MISC | 100 days supply | Qty: 100 | Fill #0

## 2018-03-31 MED ORDER — METFORMIN ER 500 MG TABLET,EXTENDED RELEASE 24 HR: tablet | 3 refills | 0 days

## 2018-03-31 MED ORDER — MONTELUKAST 10 MG TABLET: 10 mg | each | 3 refills | 0 days | Status: AC

## 2018-03-31 MED ORDER — METFORMIN ER 500 MG TABLET,EXTENDED RELEASE 24 HR
ORAL_TABLET | Freq: Every day | ORAL | 3 refills | 0.00000 days | Status: CP
Start: 2018-03-31 — End: 2018-03-31

## 2018-03-31 MED ORDER — ATORVASTATIN 20 MG TABLET: tablet | 3 refills | 0 days | Status: AC

## 2018-03-31 MED ORDER — MONTELUKAST 10 MG TABLET
Freq: Every evening | ORAL | 3 refills | 0.00000 days | Status: CP
Start: 2018-03-31 — End: 2019-03-31

## 2018-03-31 MED ORDER — ATORVASTATIN 20 MG TABLET
ORAL_TABLET | Freq: Every day | ORAL | 3 refills | 0.00000 days | Status: CP
Start: 2018-03-31 — End: 2019-03-31

## 2018-03-31 MED FILL — ALBUTEROL SULFATE/0.083%/NEBU: ALBUTEROL SULFATE/0.083%/NEBU | 30 days supply | Qty: 4 | Fill #2

## 2018-03-31 MED FILL — ALCOHOL PADS//: ALCOHOL PADS// | 50 days supply | Qty: 1 | Fill #0

## 2018-03-31 MED FILL — BD 3ML LUER-LOK SYRINGE 2/20GX1.5/MISC: BD 3ML LUER-LOK SYRINGE 2/20GX1.5/MISC | 30 days supply | Qty: 60 | Fill #0

## 2018-03-31 MED FILL — PARI LC PLUS NEBULIZER SET//MISC: PARI LC PLUS NEBULIZER SET//MISC | 30 days supply | Qty: 1 | Fill #0

## 2018-03-31 MED FILL — SERTRALINE HCL/50MG/TABS: SERTRALINE HCL/50MG/TABS | 90 days supply | Qty: 90 | Fill #1

## 2018-03-31 MED FILL — ADVAIR HFA/230/21/AER: ADVAIR HFA/230/21/AER | 30 days supply | Qty: 1 | Fill #2

## 2018-03-31 MED FILL — STERILE WATER INJ//10ML: STERILE WATER INJ//10ML | 30 days supply | Qty: 60 | Fill #0

## 2018-03-31 MED FILL — FLUTICASONE/50MCG/ACT/SUSP: FLUTICASONE/50MCG/ACT/SUSP | 30 days supply | Qty: 1 | Fill #3

## 2018-03-31 MED FILL — TOPIRAMATE/100MG/TABS: TOPIRAMATE/100MG/TABS | 90 days supply | Qty: 90 | Fill #1

## 2018-03-31 MED FILL — COLISTIMETHATE SODIUM/150MG/SOLR: COLISTIMETHATE SODIUM/150MG/SOLR | 30 days supply | Qty: 60 | Fill #0

## 2018-03-31 MED FILL — CETIRIZINE HCL/10MG/TABS: CETIRIZINE HCL/10MG/TABS | 90 days supply | Qty: 90 | Fill #1

## 2018-03-31 MED FILL — VENTOLIN HFA/90MCG/AER: VENTOLIN HFA/90MCG/AER | 17 days supply | Qty: 1 | Fill #0

## 2018-03-31 NOTE — Unmapped (Signed)
Needed med refills.  sent

## 2018-03-31 NOTE — Unmapped (Signed)
[facility] Refill Coordination Note    Specialty Medication(s) to be Shipped:   Being Delivered 04/01/18  Colistin Kit-sterile water,neb cup,syringe/needle,alcohol pads    Flonase  Zyrtec 10mg   Zoloft 50mg   Ventolin HFA  Topiramate 100mg   Advair HFA 230/21  Albuterol Nebs     Being Delivered 04/07/18  To get 90 day supplies  Montelukast 10mg   Atorvastatin 20mg   Metformin ER 500mg     Being Delivered 04/26/18  Due to rfts  Omeprazole 20mg        Isabella Bowens Sr., DOB: 01/07/1966  Phone: [phone] (home)   Shipping [address], [place], [place] [address]  All above HIPAA information was verified with patient.     Completed refill call assessment today to schedule patient's medication shipment from the [facility] [phone]).       Specialty medication(s) and dose(s) confirmed: Regimen is correct and unchanged.   Changes to medications: [name] reports no changes reported at this time.  Changes to insurance: No  Questions for the pharmacist: No    The patient will receive an FSI print out for each medication shipped and additional FDA Medication Guides as required.  Patient education from [place] or Robet Leu may also be included in the shipment.    DISEASE-SPECIFIC INFORMATION        N/A    ADHERENCE     Medication Adherence    Patient reported X missed doses in the last month:  0  Specialty Medication:  colistin Kit-Start date:04/02/18  Patient is on additional specialty medications:  No  Informant:  patient  Support network for adherence:  family member  Confirmed plan for next specialty medication refill:  delivery by pharmacy  Refills needed for supportive medications:  yes, ordered or provider notified          Refill Coordination    Has the Patients' Contact Information Changed:  No  Is the Shipping Address Different:  No         MEDICARE PART B DOCUMENTATION     Albuterol 0.083% Nebules: Patient has 0 on hand.    SHIPPING     Shipping address confirmed in FSI. Delivery Scheduled: Yes, Expected medication delivery date: 04/01/18.  However, Rx request for refills was sent to the provider as there are none remaining.     [name]   [facility] Specialty Technician

## 2018-04-04 ENCOUNTER — Encounter: Admit: 2018-04-04 | Discharge: 2018-04-05 | Payer: PRIVATE HEALTH INSURANCE

## 2018-04-04 DIAGNOSIS — K760 Fatty (change of) liver, not elsewhere classified: Principal | ICD-10-CM

## 2018-04-04 LAB — HEPATIC FUNCTION PANEL
ALBUMIN: 4.5 g/dL (ref 3.5–5.0)
ALKALINE PHOSPHATASE: 100 U/L (ref 38–126)
ALT (SGPT): 34 U/L (ref 19–72)
BILIRUBIN DIRECT: 0.3 mg/dL (ref 0.00–0.40)

## 2018-04-04 LAB — ALKALINE PHOSPHATASE: Alkaline phosphatase:CCnc:Pt:Ser/Plas:Qn:: 100

## 2018-04-06 MED FILL — MONTELUKAST SODIUM/10MG/TABS: MONTELUKAST SODIUM/10MG/TABS | 90 days supply | Qty: 90 | Fill #0

## 2018-04-07 ENCOUNTER — Encounter: Admit: 2018-04-07 | Discharge: 2018-04-07 | Payer: PRIVATE HEALTH INSURANCE

## 2018-04-07 DIAGNOSIS — G4733 Obstructive sleep apnea (adult) (pediatric): Principal | ICD-10-CM

## 2018-04-08 MED FILL — FEXOFENADINE HCL 180 MG TAB/180MG/TAB: FEXOFENADINE HCL 180 MG TAB/180MG/TAB | 90 days supply | Qty: 90 | Fill #1

## 2018-04-11 MED FILL — METFORMIN HCL/ER 500MG/TAB: METFORMIN HCL/ER 500MG/TAB | 90 days supply | Qty: 270 | Fill #0

## 2018-04-11 MED FILL — ATORVASTATIN/20MG/TABS: ATORVASTATIN/20MG/TABS | 90 days supply | Qty: 90 | Fill #0

## 2018-04-11 NOTE — Unmapped (Signed)
Order for patient's CPAP has been faxed to [facility] at [phone].  [facility] LPN

## 2018-04-13 MED ORDER — RIZATRIPTAN 10 MG TABLET: tablet | 11 refills | 0 days

## 2018-04-13 MED ORDER — RIZATRIPTAN 10 MG TABLET
ORAL_TABLET | 12 refills | 0.00000 days | Status: CP
Start: 2018-04-13 — End: 2018-04-13

## 2018-04-13 MED FILL — RIZATRIPTAN BENZOATE/10MG/TABS: RIZATRIPTAN BENZOATE/10MG/TABS | 25 days supply | Qty: 10 | Fill #0

## 2018-04-13 NOTE — Unmapped (Signed)
-----   Message from Jasper Loser sent at 04/08/2018 10:48 AM EDT -----  Regarding: Refill Needed  Hello Dr. Rosita Fire,    This patient is out of refills for Maxalt 10mg , whenever you have a chance, can you send refills to our pharmacy so that we can send this medication out to the patient??      Thanks,  [facility]

## 2018-04-13 NOTE — Unmapped (Signed)
error 

## 2018-04-25 MED ORDER — OMEPRAZOLE 20 MG CAPSULE,DELAYED RELEASE: capsule | 3 refills | 0 days | Status: AC

## 2018-04-25 MED ORDER — OMEPRAZOLE 20 MG CAPSULE,DELAYED RELEASE
ORAL_CAPSULE | ORAL | 3 refills | 0.00000 days | Status: CP
Start: 2018-04-25 — End: 2019-06-28
  Filled 2018-07-18: qty 180, 90d supply, fill #0

## 2018-04-25 NOTE — Unmapped (Signed)
Please eprescribe teed up medication to pharmacy on encounter.  Please let me know if I can do anything to help you.  Thank you.

## 2018-04-27 MED FILL — OMEPRAZOLE/20MG/CPDR: OMEPRAZOLE/20MG/CPDR | 90 days supply | Qty: 180 | Fill #0

## 2018-05-16 MED ORDER — ALCOHOL SWABS
5 refills | 0.00000 days | Status: CP
Start: 2018-05-16 — End: 2019-05-17

## 2018-05-16 MED ORDER — ALCOHOL SWABS: each | 5 refills | 0 days | Status: AC

## 2018-05-16 NOTE — Unmapped (Signed)
[facility] Refill Coordination Note    Specialty Medication(s) to be Shipped:   CF/Pulmonary: -Inhaled Colistin 150mg  and kit: Sterile Water, syringes with needles, Pari nebulizer cup, alcohol swabs  -Pari nebulizer cup  -44    Other medication(s) to be shipped:   Advair 230/21  Albuterol 0.083% Nebs  Ventolin  Flonase  Rizatriptan 10mg      Brett Hanna Sr., DOB: 21-Mar-1966  Phone: [phone] (home)   Shipping Address: [address]  [place] [address]    All above HIPAA information was verified with patient's family member.     Completed refill call assessment today to schedule patient's medication shipment from the [facility] [phone]).       Specialty medication(s) and dose(s) confirmed: Regimen is correct and unchanged.   Changes to medications: Brett Hanna reports no changes reported at this time.  Changes to insurance: No  Questions for the pharmacist: No    The patient will receive an FSI print out for each medication shipped and additional FDA Medication Guides as required.  Patient education from [place] or Robet Leu may also be included in the shipment.    DISEASE/MEDICATION-SPECIFIC INFORMATION        For CF patients: CF [facility] Active? No-not enrolled    ADHERENCE     Medication Adherence    Patient reported X missed doses in the last month:  0  Specialty Medication:  colistin Kit #OH-none. Start date in August.  Patient is on additional specialty medications:  Yes  Additional Specialty Medications:  [place], but doesn't get from our pharmacy.  Patient Reported Additional Medication X Missed Doses in the Last Month:  0  Informant:  patient  Reliability of informant:  fairly reliable  Support network for adherence:  family member  Confirmed plan for next specialty medication refill:  delivery by pharmacy  Refills needed for supportive medications:  yes, ordered or provider notified          Refill Coordination    Has the Patients' Contact Information Changed:  No  Is the Shipping Address Different:  No         MEDICARE PART B DOCUMENTATION     albuterol 0.083% Nebs: Patient has 7 vials on hand.    SHIPPING     Shipping address confirmed in FSI.     Delivery Scheduled: Yes, Expected medication delivery date: 05/20/18.  However, Rx request for refills was sent to the provider as there are none remaining.     [name]   [facility] Specialty Technician

## 2018-05-19 MED FILL — BD 3ML LUER-LOK SYRINGE 2/20GX1.5/MISC: BD 3ML LUER-LOK SYRINGE 2/20GX1.5/MISC | 30 days supply | Qty: 60 | Fill #1

## 2018-05-19 MED FILL — ALCOHOL PADS//: ALCOHOL PADS// | 50 days supply | Qty: 1 | Fill #0

## 2018-05-19 MED FILL — RIZATRIPTAN BENZOATE/10MG/TABS: RIZATRIPTAN BENZOATE/10MG/TABS | 25 days supply | Qty: 10 | Fill #1

## 2018-05-19 MED FILL — ADVAIR HFA/230/21/AER: ADVAIR HFA/230/21/AER | 30 days supply | Qty: 1 | Fill #3

## 2018-05-19 MED FILL — PARI LC PLUS NEBULIZER SET//MISC: PARI LC PLUS NEBULIZER SET//MISC | 30 days supply | Qty: 1 | Fill #1

## 2018-05-19 MED FILL — STERILE WATER INJ//10ML: STERILE WATER INJ//10ML | 30 days supply | Qty: 60 | Fill #1

## 2018-05-19 MED FILL — COLISTIMETHATE SODIUM/150MG/SOLR: COLISTIMETHATE SODIUM/150MG/SOLR | 30 days supply | Qty: 60 | Fill #1

## 2018-05-19 MED FILL — ALBUTEROL SULFATE/0.083%/NEBU: ALBUTEROL SULFATE/0.083%/NEBU | 30 days supply | Qty: 4 | Fill #3

## 2018-05-19 MED FILL — FLUTICASONE/50MCG/ACT/SUSP: FLUTICASONE/50MCG/ACT/SUSP | 30 days supply | Qty: 1 | Fill #4

## 2018-05-19 MED FILL — VENTOLIN HFA/90MCG/AER: VENTOLIN HFA/90MCG/AER | 17 days supply | Qty: 1 | Fill #1

## 2018-05-30 MED FILL — SODIUM CHLORIDE/7%/NEBU: SODIUM CHLORIDE/7%/NEBU | 100 days supply | Qty: 600 | Fill #2

## 2018-06-10 MED ORDER — SULFAMETHOXAZOLE 400 MG-TRIMETHOPRIM 80 MG TABLET
ORAL_TABLET | Freq: Two times a day (BID) | ORAL | 0 refills | 0.00000 days | Status: CP
Start: 2018-06-10 — End: 2018-07-01

## 2018-06-10 NOTE — Unmapped (Signed)
Wife phoned and stated that [name] was not feeling very well. She stated he has has increase coughing spells x 1 week ago and has sputum.Last week she noted he coughed up some blood.  She asked if you could phone them at [phone] or [phone].

## 2018-06-11 NOTE — Unmapped (Signed)
Spoke with [name]. He is coughing more and having coughing spells. If he leans over, he feels the infection/sputum coming up, particularly from left lung. About 2 weeks ago, he had a coughing fit and expectorated a mix of sputum and red (but not bright red blood) for 1-2 days. He is feeling more tired.  Prior treatment with Bactrim was helpful.    Sent prescription for Bactrim to local pharmacy. He is scheduled to see me on August 20th.

## 2018-06-21 ENCOUNTER — Encounter
Admit: 2018-06-21 | Discharge: 2018-06-22 | Payer: PRIVATE HEALTH INSURANCE | Attending: Internal Medicine | Primary: Internal Medicine

## 2018-06-21 ENCOUNTER — Ambulatory Visit: Admit: 2018-06-21 | Discharge: 2018-06-22 | Payer: PRIVATE HEALTH INSURANCE

## 2018-06-21 ENCOUNTER — Non-Acute Institutional Stay: Admit: 2018-06-21 | Discharge: 2018-06-22 | Payer: PRIVATE HEALTH INSURANCE

## 2018-06-21 DIAGNOSIS — G4733 Obstructive sleep apnea (adult) (pediatric): Principal | ICD-10-CM

## 2018-06-21 DIAGNOSIS — J479 Bronchiectasis, uncomplicated: Principal | ICD-10-CM

## 2018-06-21 DIAGNOSIS — J470 Bronchiectasis with acute lower respiratory infection: Secondary | ICD-10-CM

## 2018-06-21 NOTE — Unmapped (Signed)
Bronchiectasis Clinic Pharmacist Consult Note     [name] was seen today for follow-up of bronchiectasis, and had pharmacy/medication-related concerns.     1. Advair Effectiveness  Patient states that he was switched from Symbicort to Advair [facility] Aug 2018 and that the Advair has not worked as well. Advair HFA is preferred by his current insurance ([employer] Medicare Part D) and likely the reason for the change as he was previously on [facility] PAP before switching to Medicare Part D last August. Discussed another ICS/LABA option on formulary, Earlie Server, with Dr. Garner Nash but it is likely the course of his disease that has worsened and not the effectiveness of the drug. Informed patient and he agreed to continue with Advair.     2. Albuterol Inhaler  Patient states that he was using Proventil HFA, but the new albuterol inhaler sent by [facility] was blue (likely Ventolin HFA) and he is unsure about using it. Assured him that they are the same drug and safe to use.     3. Maxalt  Patient was concerned about the efficacy of Maxalt due to change in packaging from recent refills. Upon review by [facility] pharmacy technician Sharolyn Douglas, he was switched from brand to generic. As the brand-name Maxalt he brought to clinic was expired since 2016, asked patient to dispose of expired medications and that the brand-generic drugs are the same.     4. Billing from [facility]  Patient's wife asked about $36 bill from July 2019 and $26 bill from Aug 2019 when they have always had $0 copays in the past. [facility] pharmacy technician Sharolyn Douglas and she noted some discrepancies. Corrie Dandy will call billing for review and they will follow-up with the patient.     Entered by: Cher Nakai, PharmD Candidate    Time spent with patient: 15 minutes    Clinical Pharmacist Attestation     I reviewed the PharmD Candidate's note and agree with documentation.  The documentation recorded by the scribe accurately reflects the service I personally performed and the decisions made by me.     Signature: Francesca Jewett, BCPS, CPP. June 28, 2018 2:23 PM.  Clinical Pharmacist Practitioner  [facility]   [facility]  Pager: [phone]

## 2018-06-21 NOTE — Unmapped (Addendum)
Increase frequency of hypertonic saline to three times a day.  Okay to wear the Vest as much as you like.     Looking into CPAP setting change and getting you pillow to keep you in side sleeping position.    Symptoms of a bronchiectasis exacerbation: 48 hours of at least three of the following:  ?? Increased cough  ?? Change in volume or appearance of sputum  ?? Increased sputum purulence  ?? Worsening shortness of breath and/or exercise tolerance  ?? Fatigue and/or malaise  ?? Coughing up blood (hemoptysis)    If you are experiencing some of these symptoms:  ?? Increase the frequency and/or intensity of your airway clearance  ?? Try to submit a sputum sample for bacterial and AFB cultures (at [facility] or locally)  ?? Reach out to me or your local physician.  ?? If you need antibiotics, recommend treating for 14 days.      Please bring any new airway clearance equipment to your next visit to ensure that you are using and caring for it properly.    I recommend receiving the standard dose quadrivalent influenza vaccine this fall.     Thank you for allowing me to be a part of your care. Please call the clinic with any questions.    Viona Gilmore, MD, MPH  Pulmonary and Critical Care Medicine  [address]  [id number]  [place], [place] [address]    Thank you for your visit to the [facility]. You may receive a survey from [facility] regarding your visit today, and we are eager to use this feedback to improve your experience. Thank you for taking the time to fill it out.    Between appointments, you can reach [place] at these numbers:    For appointments or the Pulmonary Nurse: [phone]  Fax: [phone]    For urgent issues after hours:  Hospital Operator: [phone], ask for Pulmonary Fellow on call    For further information, check out the websites below:    [facility] for Bronchiectasis: [email or web address]    World Bronchiectasis Conference in [place] Patient session May 15, 2017: Videos can be watched here.     Bronchiectasis Toolbox (information about airway clearance): DiningCalendar.de    Copy for Individuals with Bronchiectasis and/or NTM through the Bronchiectasis Registry and the COPD Foundation: https://www.mckee-young.org/    Information about Non-tuberculous Mycobacterial Infections:  https://www.ntminfo.org     Interested in clinical trials and other research opportunities?  www.clinicaltrials.gov  The Rare Diseases Clinical Research Network [facility]) Genetic Disorders of  Mucociliary Clearance Consortium [facility]) Contact Registry is a way for patients with disorders of mucociliary clearance (such as bronchiectasis) and their family members to learn about research studies they may be able to join. Participation is completely voluntary and you may choose to withdraw at any time. There is no cost to join the [employer]. For more information or to join the registry please go to the following website:  [email or web address]

## 2018-06-21 NOTE — Unmapped (Signed)
[facility]  [facility]    Assessment:      Patient:Brett D Gul Sr. ([birth year])  Reason for visit: Brett Hanna is a 52 y.o.male who returns for follow-up of bronchiectasis secondary to alpha-1 antitrypsin carrier state with prior NTM infection status post treatment and OSA who presents today after 1 week of Bactrim for presumed Streptomyces causing his exacerbation.       Plan:      Problem List Items Addressed This Visit        Respiratory    Bronchiectasis (CMS-HCC)     Bronchiectasis Severity Index Score:    Age 16-69 = 2   BMI 18.5 or higher = 0   FEV1 % predicted 50-80% = 1   Hospitalized for severe exacerbation in past 2 years No = 0   Exacerbations in previous year 3 or more = 2   mMRC dyspnea score 3 = I stop for breath after walking about 100 yards or after a few minutes on level ground.   Chronic Pseudomonas colonization (at least 2 positive cultures at least 3 months apart within 1 year) No = 0   Colonization with other potential pathogenic bacteria (at least 2 positive cultures at least 3 months apart within 1 year) Yes = 1   Radiologic Severity 3 or more lobes or cystic brx = 1       Total Score 10     BSI score of 9+ = Severe bronchiectasis (1 year outcomes 7.6 - 10.5% mortality, 16.7 - 52.6% hospitalization rate; 4 year outcomes 9.9 - 29.2% mortality, 41.2 - 80.4% hospitalization)    Brett Hanna JD et al. The Bronchiectasis Severity Index: An International Derivation and Validation Study. AJRCCM, 2013; 189(5): [id number].)         Obstructive sleep apnea syndrome - Primary    Relevant Orders    Home Medical Equipment    Home Medical Equipment      Reinforced importance of regular airway clearance.  In bronchiectasis, there is impaired mucus clearance from both the airway changes and from the underlying cause of the bronchiectasis.  This impaired clearance leads to accumulation of mucus, resulting in inflammation, accumulation of bacteria, and worsening damage to the airways.  Therefore, augmentation of airway clearance is critical. Brett Hanna was provided websites with information about bronchiectasis, social forums for patients with bronchiectasis and NTM, and airway clearance techniques    Patient met with the respiratory therapist today to discuss airway clearance methods and discuss home NIPPV equipment. Patient was encouraged to increase the frequency of airway clearance.  Patient should bring any new airway clearance devices to the next visit to review technique with the respiratory therapist.  No changes were made to inhaled medications. He can wear the Vest has much as he likes; it is not harmful to use it more often. He should hold airway clearance for 24 hours if he experiences more hemoptysis.     Patient was unable to expectorate sputum today to be sent for bacterial and AFB cultures.  If he has future exacerbations, sputum should be sent for bacterial and AFB cultures. The bacterial culture should be processed like a cystic fibrosis sample given the overlapping pathogens. There are standing orders for these cultures in our system.    He will continue the 3 week treatment with Bactrim to cover for Streptomyces. He will let me know if symptoms fail to improve further or worsen.    [facility]  were contacted regarding his CPAP setting change and new CPAP mask. I also ordered a positional device to ensure that he always sleeps on his side.    Brett Hanna should obtain the quadrivalent flu vaccine in the fall.     Brett Hanna will return to clinic in 3 months for follow-up with pre-bronchodilator spirometry and sputum cultures.  He will call or send me a message via MyChart if questions or concerns arise before this visit.      Subjective:      HPI: Brett Hanna is a 52 y.o. male who returns for follow-up of bronchiectasis secondary to alpha-1 antitrypsin carrier state with prior NTM infection status post treatment and OSA.  Brett Hanna was followed in the [facility] pulmonary clinic for many years until transitioning to Dr. Ulyses Jarred care at [facility] in 2015. He returned to see me on 03/25/17. 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 He was intolerant of rifampin. He completed clarithromycin and ethambutol in July 2018.       At the last clinic visit on 03/08/18, I treated Brett Hanna with Bactrim for 3 weeks to cover Streptomyces.  His symptoms improved until he contacted me on 06/10/18 due to increased cough with coughing spells and an episode of hemoptysis. Based on these symptoms, I started him on Bactrim.    Respiratory Symptoms:  ?? Cough: Productive of pale to green sticky sputum. Better since starting abx; not choking on mucus now and sputum taste has improved. If lays on left side, tends to cough up mucus.  Coughing attacks if laughs.   ?? Wheezing: present and at times is just as bad as prior.  ?? Chest tightness: present intermittently, when less productive cough.  ?? Rescue albuterol use: Uses inhaler 2-4 times per day at least (with spacer) in addition to the three times a day nebulized (twice a day before HTS).   ?? Pleurisy: absent.  ?? Hemoptysis: Following a coughing fit in late July, he expectorated a mix of sputum and red blood for 1-2 days. This was around the time he switched to Colistin. When switched to Colistin on August 1, something broke loose and brought up large plug of mucus. There was bit of blood. Felt better after that but it didn't last.  ?? MMRC Stage:3 = I stop for breath after walking about 100 yards or after a few minutes on level ground.     Exacerbations:  ?? Exacerbations in past 1 year treated with antibiotics: 5 (Aug 2018 - Minocycline; Feb 2019 - Augmentin; April 2019 - Augmentin; May 2019 - Bactrim; Aug 2019 - Bactrim)  ?? Last exacerbation: August 2019  ?? Admissions for exacerbations in past year: None  ?? Last hospitalization: Never    Pulmonary Therapies:  ?? Airway clearance: SmartVest twice a day; Aerobika 2 times per day; Hypertonic Saline 7% nebulized 2 times per day.   ?? Inhaled antibiotics: Cycles Colistin and Cayston. He is currently on Cayston.   ?? Exercise: Tries to walk on [employer] when he can  ?? Pulmonary rehab: Referred to [facility] in January  ?? Supplemental oxygen: Not needed  ?? NIPPV: Still on CPAP 15 cm H2O. Had repeat sleep study done 04/08/18 and recommendation was to change to 14 cm H2O and strict lateral sleep position.  DME company hasn't been able to connect with Brett Hanna to change settings.    Review  of Systems  Was getting some sweats at night but hasn't occurred since started antibiotic. Remainder of a complete review of systems was negative unless mentioned above.    Past Medical History:   Diagnosis Date   ??? Allergic rhinitis    ??? Alpha-1-antitrypsin deficiency carrier (CMS-HCC)     with slightly low levels   ??? Amblyopia    ??? Asthma    ??? Asthma, extrinsic    ??? Bronchiectasis (CMS-HCC) 06/12/2011   ??? Chronic sinusitis    ??? Colon polyps    ??? Diabetes mellitus (CMS-HCC)    ??? GERD (gastroesophageal reflux disease)    ??? H/O Mycobacterium avium complex infection    ??? Impaired glucose tolerance    ??? Obesity    ??? Pneumonia    ??? Pseudomonas respiratory infection    ??? 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 Sleep apnea    ??? Strabismus    ??? Strep throat        Current Outpatient Medications   Medication Sig Dispense Refill   ??? acetaminophen (TYLENOL EXTRA STRENGTH) 500 MG tablet Take 1 tablet by mouth as needed for pain     ??? albuterol 2.5 mg /3 mL (0.083 %) nebulizer solution INHALE 3 ML VIA NEBULIZER TWICE DAILY AND EVERY 6 HOURS AS NEEDED FOR SHORTNESS OF BREATH AND WHEEZING. 360 mL 14   ??? albuterol HFA 90 mcg/actuation inhaler INHALE 2 PUFFS EVERY FOUR (4) HOURS AS NEEDED FOR WHEEZING. 18 g 11   ??? alcohol swabs PadM USE AS DIRECTED WITH INHALED ANTIBIOTICS 2000 each 5   ??? atorvastatin (LIPITOR) 20 MG tablet TAKE 1 TABLET (20 MG TOTAL) BY MOUTH DAILY. 90 tablet 3   ??? aztreonam lysine (CAYSTON) 75 mg/mL Nebu nebulization solution Inhale 1 mL (75 mg total) Three (3) times a day. 28 days on and 28 days off. 84 mL 5   ??? Bifidobacterium infantis (ALIGN) 4 mg cap Take 4 mg by mouth.     ??? blood-glucose meter kit Check sugars once daily. 1 each 0   ??? blood-glucose meter Misc USE TO CHECK BLOOD SUGAR ONCE DAILY 1 each 0   ??? cetirizine (ZYRTEC) 10 MG tablet TAKE 1 TABLET (10 MG TOTAL) BY MOUTH DAILY. 90 tablet 3   ??? cholecalciferol, vitamin D3, (VITAMIN D3) 2,000 unit cap Take 1 capsule (2,000 Units total) by mouth daily. 30 capsule prn   ??? colistimethate (COLYMYCIN) 150 mg injection INJECT STERILE WATER FOR INJECTION INTO COLISTIN VIAL THEN WITHDRAW FOR DOSE. INHALE OF MIX TWICE DAILY 60 each 5   ??? fexofenadine (ALLEGRA) 180 MG tablet TAKE 1 TABLET (180 MG TOTAL) BY MOUTH DAILY. 90 tablet 3   ??? fluticasone propion-salmeterol (ADVAIR HFA) 230-21 mcg/actuation inhaler INHALE 2 PUFFS BY MOUTH TWICE DAILY 12 g 11   ??? fluticasone propionate (FLONASE) 50 mcg/actuation nasal spray USE 2 SPRAYS INTO EACH NOSTRIL DAILY (Patient taking differently: USE 2 SPRAYS IN EACH DAILY AS NEEDED FOR RHINITIS) 16 g 5   ??? lancets 30 gauge Misc USE TO TEST BLOOD SUGAR ONCE DAILY BEFORE BREAKFAST 100 each 3   ??? lancets Misc 1 each by Other route once daily. Test daily before breakfast. Dispense 90 day supply. 100 each 3   ??? metFORMIN (GLUCOPHAGE-XR) 500 MG 24 hr tablet TAKE 3 TABLETS (1 500 MG TOTAL) BY MOUTH EVERY MORNING BEFORE BREAKFAST. 270 tablet 3   ??? MISCELLANEOUS MEDICAL SUPPLY MISC Frequency:PHARMDIR   Dosage:0.0     Instructions:  Note:CPAP 15 cm H20 with heated humidity for nasal dryness.  Mask: ResMed Quattro size Medium. Dx OSA. Dose: 1     ??? montelukast (SINGULAIR) 10 mg tablet TAKE 1 TABLET BY MOUTH NIGHTLY 90 each 3   ??? nystatin (MYCOSTATIN) 100,000 unit/mL suspension Take 5 mL by mouth Four (4) times a day.      ??? omeprazole (PRILOSEC) 20 MG capsule TAKE 1 CAPSULE BY MOUTH TWICE DAILY 180 capsule 3   ??? ON CALL EXPRESS TEST STRIP Strp USE TO TEST BLOOD SUGAR ONCE DAILY BEFORE BREAKFAST 100 each 3   ??? PARI LC D NEBULIZER Misc Provide 1 device  for use with inhaled medication. 1 each 5   ??? predniSONE (DELTASONE) 20 MG tablet TAKE 2 TABLETS (40MG  TOTAL) BY MOUTH DAILY FOR 5 DAYS 10 tablet 0   ??? rizatriptan (MAXALT) 10 MG tablet TAKE 1 TABLET BY MOUTH AT ONSET OF MIGRAINE. MAY REPEAT IN 2 HOURS ( DO NOT EXCEED 3 TABLETS (30MG ) IN 24 HOURS ) 10 tablet 12   ??? sertraline (ZOLOFT) 50 MG tablet TAKE 1 TABLET BY MOUTH ONCE DAILY 90 tablet 2   ??? sod bicarb-sod chlor-neti pot (AYR SALINE NASAL NETI RINSE) pkdv Use twice per day     ??? sodium chloride 7% 7 % Nebu INHALE 3 ML VIA NEBUZLIER TWICE DAILY (Patient taking differently: INHALE 4 ML VIA NEBUZLIER TWICE DAILY) 600 mL 99   ??? sterile water, PF, Soln Add 1mL SWFI into neb cup along with colistin. 60 ampule 5   ??? sulfamethoxazole-trimethoprim (BACTRIM,SEPTRA) 400-80 mg per tablet Take 2 tablets (160 mg of trimethoprim total) by mouth Two (2) times a day. for 21 days 84 tablet 0   ??? syringe with needle (SYRINGE 3CC/20GX1) 3 mL 20 gauge x 1 Syrg For use with inhaled antibiotic 60 Syringe 5   ??? syringe with needle 3 mL 20 gauge x 1 1/2 Syrg USE AS DIRECTED WITH INHALED ANTIBIOTIC 60 each 26   ??? topiramate (TOPAMAX) 100 MG tablet TAKE 1 TABLET (100 MG TOTAL) BY MOUTH NIGHTLY. 90 tablet 3     No current facility-administered medications for this visit.        Allergies  Reviewed at 8:53 AM      Reactions Comment    Codeine Shortness Of Breath, Rash     Rifampin  Fevers, chills.    Daliresp [roflumilast] Other (See Comments) Back pain          Social History     Socioeconomic History   ??? Marital status: Married     Spouse name: Brett Hanna   ??? Number of children: 3   ??? Years of education: None   ??? Highest education level: None Occupational History     Employer: DISABLED   Social Needs   ??? Financial resource strain: None   ??? Food insecurity:     Worry: None     Inability: None   ??? Transportation needs:     Medical: None     Non-medical: None   Tobacco Use   ??? Smoking status: Never Smoker   ??? Smokeless tobacco: Former Estate agent and Sexual Activity   ??? Alcohol use: No     Alcohol/week: 0.0 standard drinks   ??? Drug use: No   ??? Sexual activity: Yes     Partners: Female   Lifestyle   ??? Physical activity:     Days per week: None     Minutes per session: None   ???  Stress: None   Relationships   ??? Social connections:     Talks on phone: None     Gets together: None     Attends religious service: None     Active member of club or organization: None     Attends meetings of clubs or organizations: None     Relationship status: None   Other Topics Concern   ??? Do you use sunscreen? Not Asked   ??? Tanning bed use? Not Asked   ??? Are you easily burned? Not Asked   ??? Excessive sun exposure? Not Asked   ??? Blistering sunburns? Not Asked   Social History Narrative    Hasn't yet required services for disability. 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         Objective:   Objective   BP 114/63  - Pulse 73  - Temp 36.4 ??C (97.5 ??F) (Oral)  - Wt 86.6 kg (191 lb)  - SpO2 94%  - BMI 31.10 kg/m??   General Appearance:   Overweight Caucasian male appearing comfortable, non-toxic, in no distress, and stated age.   Eyes:  PERRL, conjunctiva clear, EOM's intact. No drainage.   Nose: Nares normal, septum midline. Markedly congested nasal mucosa with mild erythema.  No drainage or polyps.  No sinus tenderness.   Oropharynx: Moist mucus membranes without lesions or thrush.  Good dentition.  Posterior pharynx clear.  Mallampati Grade IV airway.   Respiratory:   Dry crackles over mid to lower lung fields. Wheeze with exhalation mainly over upper lobes. Easy work of breathing without accessory muscle use.     Cardiovascular:  Regular rate and rhythm. S1 and S2 normal. No murmur, rub  or gallop.  Pulses intact and symmetric.  No peripheral cyanosis or edema.   Gastrointestinal:   Abdomen protuberant.    Musculoskeletal: No tenderness or deformity of the chest wall.  Joints normal.   No clubbing.   Skin: No rashes, lesions, skin changes.   Heme/Lymph: No cervical, supraclavicular, submandibular, or suprasternal adenopathy.  No bruising or petechiae.   Neurologic: Alert and oriented x3. No focal neurological deficits.  Normal gait.      Diagnostic Review:   Pulmonary Functions Testing Results:    Measures are suggestive of moderate restriction. Values are decreased as compared to baseline but slightly improved (FVC) from May 2019.   FEV1 (% predicted) FVC (% predicted) FEV1/FVC   05/12/13 2.92 L (84.6%) 3.48 L (79.5%) 83.9%   06/26/14 2.29 L (66.7%) 2.84 L (64.7%) 80.6%   03/25/17 2.44 L (71.4%) 2.95 L (67.1%) 82.7%   06/29/17 2.34 L (70.2%) 2.68 L (62.1%) 87.3%   11/23/17 2.20 L (65.8%) 2.62 L (60.8%) 84.0%   03/08/18 2.06 L (61.5%) 2.59 L (60.1%) 79.5%   06/21/18 2.04 L (61.6%) 2.76 L (64.4%) 73.9%     Chest CT (03/18/18): Images personally reviewed. Bronchial wall thickening and bronchiectasis primarily involving the airways supplying the lingula and lung bases with associated clustered micronodular and tree-in-bud opacities. With respect to 02/02/2018, there is increased opacification at the deep posterior costophrenic recesses with more volume loss particularly at the left base, favored to represent atelectasis.    Chest CT (02/02/18): Images personally reviewed. Bronchiectasis predominantly in bases of lingula, right middle lobe, and bilateral lower lobes (left greater than right), mild to moderately progressed since 03/30/2017. ?? New and increased nodular and tree-in-bud opacities, now involving all lobes. Patchy consolidations in the bases of the lingula, and lower  lobes also stable to mildly increased. Also new left upper lobe nodular opacity with adjacent groundglass, 8 mm, (2:26). No definite reticular opacities, or honeycombing identified. No appreciable air trapping on expiratory imaging.    Chest CT (03/30/17): Images personally reviewed. Interval worsening of bilateral lower lobe bronchiectasis and bronchial wall thickening with new lingular bronchiectasis. Increased clustered groundglass opacities with areas of consolidation in the lower lobes and lingula. Findings may represent sequela of endobronchial infection or impaired mucus clearance.  Bronchiectasis has been present on CT imaging since 06/12/11.    Cultures   Source Bacterial Culture AFB Smear AFB Culture   03/25/17 Sputum 3+ OPF, 3+ Stenotrophomonas negative negative   06/29/17 Sputum OPF negative Overgrown with bacteria/fungus   01/28/18 Sputum 2+ OPF, 2+ GNR glucose non-fermenter, Scedosporium negative 2 morphologies Streptomyces   02/01/18 Sputum 3+ OPF, 1+ GNR glucose non-fermenter, Scedosporium negative Streptomyces   03/02/18 Sputum 3+ OPF, 3+ GNR glucose non-fermenter, Scedosporium negative NGTD     Bronchiectasis Evaluation:  IgG with subclasses:   Lab Results   Component Value Date/Time    IGG 818 01/17/2013 10:52 AM     IgA:   Lab Results   Component Value Date/Time    IGA 109 01/17/2013 10:52 AM     IgM:   Lab Results   Component Value Date/Time    IGM 25 (L) 01/17/2013 10:52 AM     IgE:   Lab Results   Component Value Date/Time    IGE 20.9 03/30/2017 11:41 AM    IGE 34.7 11/24/2013 12:34 PM     Specific Titers: Normal Pneumococcal Antibody Titers (07/10/2010, 10/09/2010), Dipheria, and Tetanus (07/10/2010).  HIV:   Lab Results   Component Value Date/Time    HIVAGAB Nonreactive 01/17/2013 10:52 AM     Neutrophil function (08/07/2010): Normal. Not consistent with CGD.    Autoimmune: ANA (08/26/10) - negative.    ANCA:   Lab Results   Component Value Date/Time    PR3ELISA Negative 03/30/2017 11:41 AM    PR3QT 6.4 03/30/2017 11:41 AM    MPO Negative 03/30/2017 11:41 AM    MPOQT 2.2 03/30/2017 11:41 AM     CBC-D:   Lab Results   Component Value Date/Time    WBC 7.5 03/01/2018 10:56 AM    WBC 6.4 01/04/2015 10:32 AM    HGB 15.2 03/01/2018 10:56 AM    HGB 15.9 01/04/2015 10:32 AM    HCT 46.6 03/01/2018 10:56 AM    HCT 45.9 01/04/2015 10:32 AM    PLT 280 03/01/2018 10:56 AM    PLT 223 01/04/2015 10:32 AM    NEUTROABS 5.9 03/01/2018 10:56 AM    NEUTROABS 5.0 03/21/2014 08:36 AM    EOSABS 0.1 03/01/2018 10:56 AM    EOSABS 0.3 03/21/2014 08:36 AM    BANDS 1+ 07/18/2013 12:20 PM     Alpha-1:         CF testing: Sweat test at [facility] (08/19/10): 98mmol/L - not c/w CF.  PCD testing: nNO (09/10/11): 337.13 nl/min Normal and not c/w PCD    Health Care Maintenance:  Most Recent Immunizations   Administered Date(s) Administered   ??? Hep A / Hep B 08/26/2010   ??? Hepatitis B, Adult 09/16/2010   ??? INFLUENZA TIV (TRI) PF (IM) 11/17/2011   ??? Influenza Vaccine Quad (IIV4 PF) 41mo+ injectable 09/22/2016   ??? Influenza Vaccine Quad (IIV4 W/PRESERV) 50MO+ 09/26/2013   ??? Influenza Virus Vaccine, unspecified formulation 10/08/2017   ??? PNEUMOCOCCAL POLYSACCHARIDE 23 11/23/2017   ???  Pneumococcal Conjugate 13-Valent 11/24/2013   ??? TdaP 12/22/2008

## 2018-06-21 NOTE — Unmapped (Signed)
Bronchiectasis Clinic Assessment    Inhaled Medications:      Advair HFA   Albuterol Neb  Albuterol HFA  Hypertonic Saline 7%      Home O2: NA    Review Ordered Medications that are Taken, Frequency, Compliance: I reviewed techniques and order of treatments.  All questions answered.     Barriers to Compliance and Solutions: NA    Airway Clearance Used:   Brett Hanna is using his SmartVest without complication.  He states that he produces mucus when he bends over to tie his shoe and when he lays with his left side down.  We discussed the benefits of postural drainage and allowing positioning to aid in mucus removal.     Airway Clearance Not Effective: NA    Cough Techniques:  NA    Spacer/MDI Training: NA    Bipap/Cpap;Settings if Use: He says he needs a new mask interface, but it is not covered by insurance and he isn't going to pay out of pocket at this time.  He does not state that he has any other issues or concerns at this time.  His PAP unit is supplied by [facility].     Equipment Review/Cleaning: NA    DME Provider:   No needs noted at this time.    Misc. Notes: NA

## 2018-06-21 NOTE — Unmapped (Signed)
Contacted patient and provided the phone number to [facility] in an effort to get patient's CPAP adjustment done.  Faxed order for positioning device to [facility] at [phone], faxed order for CPAP mask of choice to [facility] at [phone].  Patient made aware and advised to contact [facility] as they have been unsuccessful in reaching patient by phone.  [facility] LPN

## 2018-07-01 ENCOUNTER — Encounter: Admit: 2018-07-01 | Discharge: 2018-07-01 | Payer: PRIVATE HEALTH INSURANCE

## 2018-07-01 DIAGNOSIS — E119 Type 2 diabetes mellitus without complications: Principal | ICD-10-CM

## 2018-07-01 DIAGNOSIS — Z79899 Other long term (current) drug therapy: Secondary | ICD-10-CM

## 2018-07-01 DIAGNOSIS — J470 Bronchiectasis with acute lower respiratory infection: Secondary | ICD-10-CM

## 2018-07-01 DIAGNOSIS — E559 Vitamin D deficiency, unspecified: Secondary | ICD-10-CM

## 2018-07-01 DIAGNOSIS — G43109 Migraine with aura, not intractable, without status migrainosus: Secondary | ICD-10-CM

## 2018-07-01 LAB — COMPREHENSIVE METABOLIC PANEL
ALBUMIN: 4.5 g/dL (ref 3.5–5.0)
ALKALINE PHOSPHATASE: 90 U/L (ref 38–126)
ALT (SGPT): 31 U/L (ref 19–72)
ANION GAP: 7 mmol/L — ABNORMAL LOW (ref 9–15)
AST (SGOT): 27 U/L (ref 19–55)
BILIRUBIN TOTAL: 0.6 mg/dL (ref 0.0–1.2)
BUN / CREAT RATIO: 19
CALCIUM: 9.1 mg/dL (ref 8.5–10.2)
CHLORIDE: 106 mmol/L (ref 98–107)
CO2: 25 mmol/L (ref 22.0–30.0)
CREATININE: 0.86 mg/dL (ref 0.70–1.30)
EGFR CKD-EPI NON-AA MALE: >90 mL/min/{1.73_m2} (ref >=60)
GLUCOSE RANDOM: 105 mg/dL (ref 65–179)
POTASSIUM: 4.3 mmol/L (ref 3.5–5.0)
PROTEIN TOTAL: 7.1 g/dL (ref 6.5–8.3)

## 2018-07-01 LAB — CREATININE, URINE: Lab: 70

## 2018-07-01 LAB — EGFR CKD-EPI NON-AA MALE: Lab: >90

## 2018-07-01 MED ORDER — METFORMIN ER 500 MG TABLET,EXTENDED RELEASE 24 HR
ORAL_TABLET | Freq: Every day | ORAL | 3 refills | 0 days | Status: CP
Start: 2018-07-01 — End: 2019-07-11
  Filled 2018-07-18: qty 360, 90d supply, fill #0

## 2018-07-01 NOTE — Unmapped (Signed)
Assessment and Plan    1. Type 2 diabetes mellitus without complication, without long-term current use of insulin (CMS-HCC)    2. Medication management    3. Migraine with aura and without status migrainosus, not intractable    4. Bronchiectasis with acute lower respiratory infection (CMS-HCC)    5. Vitamin D deficiency        #1. Bronchiectasis with multiple infections. Emphasized continuing vest as much as he can tolerate it. He was in agreement about increasing this. Unfortunately his nebulizer broke. He needs this for nebulized abx and it is urgent that he get this replaced. Candise Bowens RN worked with [employer] to arrange a delivery of a new nebulizer today.     #2. DM2. A1C slightly increased from last check but not at goal. Goal of < 7. Has tolerated the increase in metformin dose. Increase to metformin XR 2000 mg daily. Could separate to bid if he tolerates that better.  -Check urine microalbumin    #3. On statin. H/o elevated LFTs in the past due to a different med. Due to this will monitor LFTs at least once while on statin. Check today.    #4. Migraines. Doing well on topiramate for prophylaxis. Continue.    #5. H/o vit D deficiency. Recheck level.    #6. Reactive airways. Feels advair isn't as good as symbicort. Unclear whether we could do a prior auth for coverage of symbicort for him. Feels it is more effective than Dulera as well.     #7. OSA. Per his last sleep study, he should be sleeping on his side. There is a pillow that can help with this positioning. Based on his last sleep study this is medically necessary. It looks like pulmonary is working on arranging this for him.    Orders Placed This Encounter   Procedures   ??? Microalbumin/Creatinine Urine Ratio (ZOX096)   ??? Comprehensive Metabolic Panel   ??? Vitamin D 25 Hydroxy (25OH D2 + D3)   ??? POCT A1C - RN Obtain         Follow up: Return in about 3 months (around 10/01/2018), or if symptoms worsen or fail to improve.        Reason for visit: Follow-up and Medication Refill      Brett Bowens Sr. is a 52 y.o. male with idiopathic bronchiectasis, multiple infections, alpha-1-antitrypsin deficiency carrier, OSA, HL, obesity, DM2  who presents for follow up.    HPI  Morning bs 134-168.  Increased metformin to 1500mg  daily.  Does get loose stools 1-2 times a day but it is not bothersome to him.    Is eating less sugar. Don't buy as much sugar as before.  He and his wife have been trying to eat better.    Nebulizer machine broke this morning. It had been getting hot recently when use and this morning just stopped working. He needs it for his antibiotic nebs.    Is on bactrim for 21 days. Has helped. Wheezing isn't as bad. Coughing fits are better. Is almost done with it.   Feels like lungs are worse than they were last year-more wheezing It's a relief to be on abx now.    Every afternoon around 3-4 oclock gets achey.     Using the vest twice a day. Is ok with doing it more.     CPAP does help him feel better. He's wearing it ni He's working on getting a special pillow to help him only sleep on  his side.    Feels that the adviar doesn't work as well as symbicort did.His insurance doesn't cover symbicort.       Medications were reviewed    Physical Exam  Gen: appears better than previous visit  Vitals:    07/01/18 1033   BP: 123/67   Pulse: 65   SpO2: 96%   Weight: 87.3 kg (192 lb 6.4 oz)   Height: 166.9 cm (5' 5.71)     CV: RRR, no mrg  Pulm: Diffuse mild wheezing. Better air movement than usual  Ext: no edema  Skin: warm and dry    A1C 7.8    Wt Readings from Last 6 Encounters:   07/01/18 87.3 kg (192 lb 6.4 oz)   06/21/18 86.6 kg (191 lb)   03/18/18 88.2 kg (194 lb 8 oz)   03/08/18 89.1 kg (196 lb 6.4 oz)   03/08/18 88.5 kg (195 lb)   03/07/18 88.1 kg (194 lb 4.8 oz)     Reviewed pulmonary note. Treating with Bactrim for 21 days for strepomyces    Reviewed sleep study

## 2018-07-01 NOTE — Unmapped (Signed)
Notified of results on mychart.

## 2018-07-01 NOTE — Unmapped (Signed)
Patient in clinic reported to Dr. Rosita Fire nebulizer not working properly. Called and spoke with Jill Alexanders at [facility] care, neb machine still under warranty and new machine will be delivered to patients mother house today after 3pm. Confirmed address and patient phone number with patient and given to [place]. Patient made aware of delivery time.

## 2018-07-01 NOTE — Unmapped (Addendum)
Increase metformin to 4 pills daily. Could try 2 in the morning and 2 in the evening to see if that decreases side effect.    Your A1C is 7.8 today which is better than before. Our goal is an A1C of 7.    Blood tests today    Try to increase the number of times per day you do the vest.    We are calling over to get a new nebulizer machine. [facility] will come at 3pm.

## 2018-07-05 LAB — VITAMIN D, TOTAL (25OH): Lab: 23.5

## 2018-07-06 NOTE — Unmapped (Signed)
Bronchiectasis Severity Index Score:    Age 53-69 = 2   BMI 18.5 or higher = 0   FEV1 % predicted 50-80% = 1   Hospitalized for severe exacerbation in past 2 years No = 0   Exacerbations in previous year 3 or more = 2   mMRC dyspnea score 3 = I stop for breath after walking about 100 yards or after a few minutes on level ground.   Chronic Pseudomonas colonization (at least 2 positive cultures at least 3 months apart within 1 year) No = 0   Colonization with other potential pathogenic bacteria (at least 2 positive cultures at least 3 months apart within 1 year) Yes = 1   Radiologic Severity 3 or more lobes or cystic brx = 1       Total Score 10     BSI score of 9+ = Severe bronchiectasis (1 year outcomes 7.6 - 10.5% mortality, 16.7 - 52.6% hospitalization rate; 4 year outcomes 9.9 - 29.2% mortality, 41.2 - 80.4% hospitalization)    Clinton Quant JD et al. The Bronchiectasis Severity Index: An International Derivation and Validation Study. AJRCCM, 2013; 189(5): C339114.)

## 2018-07-07 NOTE — Unmapped (Signed)
[facility] Refill Coordination Note    Specialty Medication(s) to be Shipped:   CF/Pulmonary: -Nebulized colistin (150mg  vials) and supply kit    Other medication(s) to be shipped:   Albuterol 0.083%  Flonase  Advair 230/21  Maxalt 10 mg  Atorvastatin 20mg   Omeprazole 20  Fexofenadine 180mg   Montelukast 10mg   Cetirizine 10mg   topamax 100mg   Metformin ER500mg        Brett Simpler Sr., DOB: 19-Sep-1966  Phone: [phone] (home)   Shipping Address: [address] [place], [place]    All above HIPAA information was verified with patient.     Completed refill call assessment today to schedule patient's medication shipment from the [facility] [phone]).       Specialty medication(s) and dose(s) confirmed: Regimen is correct and unchanged.   Changes to medications: Brett Hanna reports no changes reported at this time.  Changes to insurance: No  Questions for the pharmacist: No    The patient will receive a drug information handout for each medication shipped and additional FDA Medication Guides as required.      DISEASE/MEDICATION-SPECIFIC INFORMATION        N/A    ADHERENCE     Medication Adherence    Patient reported X missed doses in the last month:  0  Specialty Medication:  Colistin 150mg  Kit #oh-none  Patient is on additional specialty medications:  No  Patient is on more than two specialty medications:  No  Demonstrates understanding of importance of adherence:  yes  Informant:  patient  Reliability of informant:  fairly reliable  Patient is at risk for Non-Adherence:  No  Support network for adherence:  family member  Confirmed plan for next specialty medication refill:  delivery by pharmacy  Refills needed for supportive medications:  yes, ordered or provider notified          Refill Coordination    Has the Patients' Contact Information Changed:  No  Is the Shipping Address Different:  No         MEDICARE PART B DOCUMENTATION     Not Applicable    SHIPPING     Shipping address confirmed in Epic.     Delivery Scheduled: Yes, Expected medication delivery date: 07/19/18 via UPS or courier.     [name]   [facility] Specialty Technician

## 2018-07-07 NOTE — Unmapped (Signed)
Notified of results on mychart.

## 2018-07-12 MED ORDER — AZTREONAM LYSINE 75 MG/ML SOLUTION FOR NEBULIZATION
Freq: Three times a day (TID) | RESPIRATORY_TRACT | 5 refills | 0.00000 days | Status: CP
Start: 2018-07-12 — End: 2019-07-12

## 2018-07-12 NOTE — Unmapped (Signed)
Received fax for refill on this medication from [facility] in [place].

## 2018-07-14 NOTE — Unmapped (Signed)
Transitions Child psychotherapist at the [facility]   (outpatient):     SWer received in-basket message from Carney Corners, RN asking to assist patient with a medication issue. SWer called patient @ [phone] (home)  and introduced self. Patient states that his Medicaid is about to drop at the end of the month because him and wife make too much money and no longer qualify for Medicaid. Patient states he is worried that he will not be able to pay for his medications after Medicaid drops.     SWer reviewed chart and it looks like patient has Medicare Part D (prescription coverage). SWer encouraged patient to speak with a representative from his Medicare Part D plan to see what  copays would be once Medicaid drops at the end of the month for his medications. Patient's wife was told he does not qualify for [facility] Assistance because he has Medicare Part D.    SWer explained to patient that this SWer will speak with team in reference to other options to be able to afford his medications.    SWer sent [facility] [employer] back to [place], including the MD and PharmD explaining the above.

## 2018-07-14 NOTE — Unmapped (Signed)
Patient's wife phoned and stated that patient's Medicaid will terminate at the end of the month and he will not be eligible anymore.     He is on [place] and Colymycin and has been dispensed with [facility].     He has medicare but she was unsure that it would cover those medications.     I let her know that I would inform our team to see if there was another resource for them.

## 2018-07-18 MED FILL — ADVAIR HFA 230 MCG-21 MCG/ACTUATION AEROSOL INHALER: RESPIRATORY_TRACT | 30 days supply | Qty: 12 | Fill #0

## 2018-07-18 MED FILL — CETIRIZINE 10 MG TABLET: 90 days supply | Qty: 90 | Fill #0

## 2018-07-18 MED FILL — BD LUER-LOK SYRINGE 3 ML 20 GAUGE X 1 1/2": 30 days supply | Qty: 60 | Fill #0 | Status: AC

## 2018-07-18 MED FILL — MONTELUKAST 10 MG TABLET: ORAL | 90 days supply | Qty: 90 | Fill #0

## 2018-07-18 MED FILL — WATER FOR INJECTION, STERILE INJECTION SOLUTION: 30 days supply | Qty: 60 | Fill #0

## 2018-07-18 MED FILL — RIZATRIPTAN 10 MG TABLET: 25 days supply | Qty: 10 | Fill #0 | Status: AC

## 2018-07-18 MED FILL — TOPIRAMATE 100 MG TABLET: 90 days supply | Qty: 90 | Fill #0 | Status: AC

## 2018-07-18 MED FILL — MONTELUKAST 10 MG TABLET: 90 days supply | Qty: 90 | Fill #0 | Status: AC

## 2018-07-18 MED FILL — ALBUTEROL SULFATE 2.5 MG/3 ML (0.083 %) SOLUTION FOR NEBULIZATION: 30 days supply | Qty: 360 | Fill #0

## 2018-07-18 MED FILL — OMEPRAZOLE 20 MG CAPSULE,DELAYED RELEASE: 90 days supply | Qty: 180 | Fill #0 | Status: AC

## 2018-07-18 MED FILL — WATER FOR INJECTION, STERILE INJECTION SOLUTION: 30 days supply | Qty: 60 | Fill #0 | Status: AC

## 2018-07-18 MED FILL — RIZATRIPTAN 10 MG TABLET: 25 days supply | Qty: 10 | Fill #0

## 2018-07-18 MED FILL — LC PLUS MISC: 30 days supply | Qty: 1 | Fill #0

## 2018-07-18 MED FILL — ATORVASTATIN 20 MG TABLET: 90 days supply | Qty: 90 | Fill #0

## 2018-07-18 MED FILL — METFORMIN ER 500 MG TABLET,EXTENDED RELEASE 24 HR: 90 days supply | Qty: 360 | Fill #0 | Status: AC

## 2018-07-18 MED FILL — COLISTIN (COLISTIMETHATE SODIUM) 150 MG SOLUTION FOR INJECTION: 30 days supply | Qty: 60 | Fill #0

## 2018-07-18 MED FILL — ADVAIR HFA 230 MCG-21 MCG/ACTUATION AEROSOL INHALER: 30 days supply | Qty: 12 | Fill #0 | Status: AC

## 2018-07-18 MED FILL — LC PLUS MISC: 30 days supply | Qty: 1 | Fill #0 | Status: AC

## 2018-07-18 MED FILL — COLISTIN (COLISTIMETHATE SODIUM) 150 MG SOLUTION FOR INJECTION: 30 days supply | Qty: 60 | Fill #0 | Status: AC

## 2018-07-18 MED FILL — FLUTICASONE PROPIONATE 50 MCG/ACTUATION NASAL SPRAY,SUSPENSION: NASAL | 30 days supply | Qty: 16 | Fill #0

## 2018-07-18 MED FILL — FLUTICASONE PROPIONATE 50 MCG/ACTUATION NASAL SPRAY,SUSPENSION: 30 days supply | Qty: 16 | Fill #0 | Status: AC

## 2018-07-18 MED FILL — CETIRIZINE 10 MG TABLET: 90 days supply | Qty: 90 | Fill #0 | Status: AC

## 2018-07-18 MED FILL — ALBUTEROL SULFATE 2.5 MG/3 ML (0.083 %) SOLUTION FOR NEBULIZATION: 30 days supply | Qty: 360 | Fill #0 | Status: AC

## 2018-07-18 MED FILL — BD LUER-LOK SYRINGE 3 ML 20 GAUGE X 1 1/2": 30 days supply | Qty: 60 | Fill #0

## 2018-07-18 MED FILL — TOPIRAMATE 100 MG TABLET: 90 days supply | Qty: 90 | Fill #0

## 2018-07-18 MED FILL — ALLERGY RELIEF (FEXOFENADINE) 180 MG TABLET: 90 days supply | Qty: 90 | Fill #0

## 2018-07-18 MED FILL — ALLERGY RELIEF (FEXOFENADINE) 180 MG TABLET: 90 days supply | Qty: 90 | Fill #0 | Status: AC

## 2018-07-18 MED FILL — ATORVASTATIN 20 MG TABLET: 90 days supply | Qty: 90 | Fill #0 | Status: AC

## 2018-07-26 MED ORDER — SULFAMETHOXAZOLE 400 MG-TRIMETHOPRIM 80 MG TABLET
ORAL_TABLET | Freq: Two times a day (BID) | ORAL | 0 refills | 0.00000 days | Status: CP
Start: 2018-07-26 — End: 2018-11-01

## 2018-07-26 NOTE — Unmapped (Signed)
Based on recommendations from ID, will treat with Bactrim for 3 months. Repeat cultures when able.

## 2018-07-26 NOTE — Unmapped (Signed)
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 They do not have the samples any more for susceptibilities, but it is generally susceptible to Bactrim (and other antibiotics). It is rarely a pathogen, but there is a article about this bacteria in pts with Cystic fibrosis (J Clin Microbiol. 2008 Aug;46(8):2774-7. doi: 10.1128/JCM.00460-08. Epub 2008 Jun 4.). I am not sure if it is contributing to his symptoms, but maybe treating with Bactrim for a few months both for that and for the streptomyces (~3 months or so) would be worth trying  Weston Brass    ----- Message -----  From: Truett Mainland, MD  Sent: 07/21/2018   5:09 PM EDT  To: Mickeal Needy, MD  Subject: question about his Gram Negative Rod, Glucos#    Weston Brass,    I started [name] on a three week course of Bactrim on August 9 2/2 increased cough. He got better but now worse after being off for three weeks. 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  I thought you mentioned you were going to get the lab to try to identify it. The only additional detail I found was a comment of environmental organism on his March and April samples.    Do you think it could be causing his frequent exacerbations? Will the lab try to identify it if I ask or does it need to come from ID?    Thanks for your help.    MLD

## 2018-09-01 NOTE — Unmapped (Unsigned)
Patient called and reported increased difficulty of breathing and cough that developed on Monday (10/28) morning and continued to worsen until this morning. Reports pain associated with cough. Pt states using HTS and albuterol nebs helped loosen up mucus and help with his SOB. Pt began to expectorate yellow-green sputum Monday night and continues to cough up sputum. Patient also reported joint pain and chills that developed over the last couple days. Reports using APAP, which has been effective for chills and pain. Also reports decreased appetite. Encouraged patient to call CF nurse line.     Patient had questions regarding bill. Appears OTCs (Zyrtec, Allegra) not covered by primary ins. Will loop in clinic pharmacist for opinion regarding this issue.     [facility] Refill Coordination Note  Specialty Medication(s): Albuterol neb soln, advair, ventolin, neb cups  Requested refill for HTS.     Patient denied: colistin (off this month, on aztreonam), flonase, maxalt.     Jannette Fogo Hilleary Sr., DOB: 16-Aug-1966  Phone: [phone] (home) , Alternate phone contact: N/A  Phone or address changes today?: No  All above HIPAA information was verified with patient.  Shipping Address: [address]  [place] [address]   Insurance changes? Yes- patient reports he is no longer covered under Medicaid (expired 08/02/18)    Completed refill call assessment today to schedule patient's medication shipment from the [facility] [phone]).      Confirmed the medication and dosage are correct and have not changed: Yes, regimen is correct and unchanged.    Confirmed patient started or stopped the following medications in the past month:  No, there are no changes reported at this time.    Are you tolerating your medication?:  [name] reports tolerating the medication.    ADHERENCE  Did you miss any doses in the past 4 weeks? Yes.  [name] reports missing 1  days of medication therapy in the last 4 weeks.  [name] reports having a stomach virus as the cause of their non-adherance.    FINANCIAL/SHIPPING    Delivery Scheduled: Yes, Expected medication delivery date: 09/08/2018     Medication will be delivered via UPS to the home address in [facility].    The patient will receive a drug information handout for each medication shipped and additional FDA Medication Guides as required.      [name] did not have any additional questions at this time.    We will follow up with patient monthly for standard refill processing and delivery.      Thank you,  Simonne Martinet   [facility] Specialty PharmD Candidate

## 2018-09-02 MED ORDER — SODIUM CHLORIDE 7 % FOR NEBULIZATION
PRN refills | 0 days | Status: CP
Start: 2018-09-02 — End: 2018-11-18
  Filled 2018-09-07: qty 720, 90d supply, fill #0

## 2018-09-02 NOTE — Unmapped (Signed)
-----   Message from Truett Mainland, MD sent at 09/02/2018 11:33 AM EDT -----  Regarding: FW: SICK PATIENT  Contact: [phone]  Can someone call him and confirm that he is still taking his Bactrim. Also want to know if pain is in one particular area or diffuse from coughing and is it reproducible with palpation. Any other symptoms like fever, chills, runny nose, sore throat? Anyone been sick? Is chest tightness because of muscle soreness or is it internal?  ----- Message -----  From: Brett Hanna  Sent: 09/01/2018   2:45 PM EDT  To: [name], RN, #  Subject: SICK PATIENT                                     Patient started Monday coughing, (but unable to cough up sputum) wheezing, chest tightness and pain in chest. Patient wants to know if he should come in next week or please advise. He would like if you could call him to discuss. Thanks

## 2018-09-02 NOTE — Unmapped (Signed)
-----   Message from Vicente Masson sent at 09/01/2018  2:45 PM EDT -----  Regarding: SICK PATIENT  Contact: [phone]  Patient started Monday coughing, (but unable to cough up sputum) wheezing, chest tightness and pain in chest. Patient wants to know if he should come in next week or please advise. He would like if you could call him to discuss. Thanks

## 2018-09-02 NOTE — Unmapped (Signed)
Patient called at request of Dr. Luanna Cole below    I talked to [name] and here are the details:     -he is still taking Bactrim     -pain is much improved, has mild ??soreness in chest from coughing     -he started with symptoms on Monday 10/28 with tight breathing, low energy, deep cough and body aches and irritable. ??On Tuesday, he had chills and a low grade fever and started coughing up chunks of yellow/green sputum. ??Wednesday, his cough was looser and mild chest soreness. Thursday about the same. ??Today he is better, less frequent coughing episodes and feels he is improving daily.     -he had no sore throat or exposures that he knows of     -he has a sputum sample in the frig that he will bring in but it will by day #4.     He agreed to call back if needed and will see you on Tuesday 11/5.

## 2018-09-02 NOTE — Unmapped (Signed)
calling per Dr.Daniels request, Brett Hanna has already called and talked to pt.

## 2018-09-06 ENCOUNTER — Encounter
Admit: 2018-09-06 | Discharge: 2018-09-07 | Payer: PRIVATE HEALTH INSURANCE | Attending: Internal Medicine | Primary: Internal Medicine

## 2018-09-06 DIAGNOSIS — J470 Bronchiectasis with acute lower respiratory infection: Principal | ICD-10-CM

## 2018-09-06 LAB — EGFR CKD-EPI NON-AA MALE: Lab: >90

## 2018-09-06 LAB — COMPREHENSIVE METABOLIC PANEL
ALBUMIN: 4.3 g/dL (ref 3.5–5.0)
ALT (SGPT): 32 U/L (ref <50)
ANION GAP: 16 mmol/L — ABNORMAL HIGH (ref 7–15)
AST (SGOT): 26 U/L (ref 19–55)
BILIRUBIN TOTAL: 0.7 mg/dL (ref 0.0–1.2)
BLOOD UREA NITROGEN: 18 mg/dL (ref 7–21)
BUN / CREAT RATIO: 20
CALCIUM: 9.1 mg/dL (ref 8.5–10.2)
CHLORIDE: 102 mmol/L (ref 98–107)
CO2: 22 mmol/L (ref 22.0–30.0)
CREATININE: 0.89 mg/dL (ref 0.70–1.30)
EGFR CKD-EPI AA MALE: >90 mL/min/{1.73_m2} (ref >=60)
EGFR CKD-EPI NON-AA MALE: >90 mL/min/{1.73_m2} (ref >=60)
GLUCOSE RANDOM: 142 mg/dL (ref 65–179)
POTASSIUM: 4.3 mmol/L (ref 3.5–5.0)
PROTEIN TOTAL: 7.3 g/dL (ref 6.5–8.3)
SODIUM: 140 mmol/L (ref 135–145)

## 2018-09-06 NOTE — Unmapped (Signed)
[facility]  [facility]asis [facility]    Assessment:      Patient:Brett Carver Fila Sr. (1966-09-08)  Reason for visit: [name] is a 52 y.o.male who returns for follow-up of bronchiectasis secondary to alpha-1 antitrypsin carrier state with prior NTM infection status post treatment and OSA.  It sounds like his acute worsening of his cough was viral in nature given his prior diarrhea and the fact that he has been on Bactrim chronically.     Plan:      Problem List Items Addressed This Visit        Respiratory    Bronchiectasis (CMS-HCC) - Primary    Relevant Orders    Comprehensive Metabolic Panel (Completed)      Reinforced importance of regular airway clearance.  In bronchiectasis, there is impaired mucus clearance from both the airway changes and from the underlying cause of the bronchiectasis.  This impaired clearance leads to accumulation of mucus, resulting in inflammation, accumulation of bacteria, and worsening damage to the airways.  Therefore, augmentation of airway clearance is critical. [name] was provided websites with information about bronchiectasis, social forums for patients with bronchiectasis and NTM, and airway clearance techniques    Patient did not need to meet with the respiratory therapist today. Patient should bring any new airway clearance devices to the next visit to review technique with the respiratory therapist.  No changes were made to airway clearance regimen.  No changes were made to inhaled medications. He can wear the Vest has much as he likes; it is not harmful to use it more often.     Patient was unable to expectorate sputum today to be sent for bacterial and AFB cultures.  If he has future exacerbations, sputum should be sent for bacterial and AFB cultures. The bacterial culture should be processed like a cystic fibrosis sample given the overlapping pathogens. There are standing orders for these cultures in our system.    He will continue the 3 month course of Bactrim. Checking CMP today.     Since his insurance will not cover a positional device to ensure that he always sleeps on his side, I advised Brett Hanna to use a tennis ball in a sock pinned to the back of his shirt when he sleeps.    Brett Hanna was given the quadrivalent flu vaccine today in clinic.    Brett Hanna will return to clinic for follow-up with pre-bronchodilator spirometry and sputum cultures as scheduled already.  He will call or send me a message via MyChart if questions or concerns arise before this visit.      Subjective:      HPI: Brett Hanna is a 52 y.o. male who returns for follow-up of bronchiectasis secondary to alpha-1 antitrypsin carrier state with prior NTM infection status post treatment and OSA.  Brett Hanna was followed in the [facility] pulmonary clinic for many years until transitioning to Dr. Ulyses Jarred care at [facility] in 2015. He returned to see me on 03/25/17. 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 He was intolerant of rifampin. He completed clarithromycin and ethambutol in July 2018.       At the last clinic visit on 03/08/18, I treated Brett Hanna with Bactrim for 3 weeks to cover Streptomyces.  His symptoms improved until he contacted me on 06/10/18 due to increased cough with coughing spells and  an episode of hemoptysis. Based on these symptoms, I started him on Bactrim for a three week course.  His symptoms initially improved and then worsened so based on discussion with ID, I started him on a 3 month course of Bactrim starting Sept 24.    On Monday (10/28), he developed tight breathing, low energy, deep cough, body aches. He used his albuterol an extra two times and performed an extra Vest session on 10/28 and 10/29.  He was expectorating yellow brown sputum and had subjective fever and chills.  His chest was sore from coughing.  He also had some leg stiffness and a bit of nasal drainage. About 1 week before these symptoms, he experienced 3 days of abdominal cramps and diarrhea.    Respiratory Symptoms:  ?? Cough: Productive of sputum that is looser than when he called last week. Loose but yellow. Not pale like usual. Had severe headache and hoarseness with coughing.   ?? Wheezing: present and at times is just as bad as prior. Was worse last week.  ?? Chest tightness: improved.   ?? Rescue albuterol use: Uses inhaler 2-4 times per day at least (with spacer) in addition to the three times a day nebulized (twice a day before HTS).   ?? Pleurisy: absent.  ?? Hemoptysis: None   ?? MMRC Stage: 3 = I stop for breath after walking about 100 yards or after a few minutes on level ground.     Exacerbations:  ?? Exacerbations in past 1 year treated with antibiotics: 5  ?? Dates of exacerbations: Aug 2018 - Minocycline; Feb 2019 - Augmentin; April 2019 - Augmentin; May 2019 - Bactrim; Aug 2019 - Bactrim; Sept 2019 - Bactrim   ?? Admissions for exacerbations in past two years: None  ?? Last hospitalization: Never    Pulmonary Therapies:  ?? Airway clearance: SmartVest twice a day; Aerobika 2 times per day; Hypertonic Saline 7% nebulized 2 times per day.   ?? Inhaled antibiotics: Cycles Colistin and Cayston. He is currently on Cayston.   ?? Exercise: Tries to walk on [employer] when he can  ?? Pulmonary rehab: Referred to [facility] in January  ?? Supplemental oxygen: Not needed  ?? NIPPV:  CPAP 14 cm H2O. Recommended to sleep in strict lateral sleep position but insurance wouldn't cover device.    Review of Systems  Some days, energy is decreased and feels weak. Remainder of a complete review of systems was negative unless mentioned above.    Past Medical History:   Diagnosis Date   ??? Allergic rhinitis    ??? Alpha-1-antitrypsin deficiency carrier     with slightly low levels   ??? Amblyopia    ??? Asthma    ??? Asthma, extrinsic    ??? Bronchiectasis (CMS-HCC) 06/12/2011   ??? Chronic sinusitis    ??? Colon polyps    ??? Diabetes mellitus (CMS-HCC)    ??? GERD (gastroesophageal reflux disease)    ??? H/O Mycobacterium avium complex infection    ??? Impaired glucose tolerance    ??? Obesity    ??? Pneumonia    ??? Pseudomonas respiratory infection    ??? 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 Sleep apnea    ??? Strabismus    ??? Strep throat        Current Outpatient Medications   Medication Sig Dispense Refill   ??? acetaminophen (TYLENOL EXTRA STRENGTH) 500 MG tablet Take 1 tablet by mouth as needed for pain     ???  albuterol 2.5 mg /3 mL (0.083 %) nebulizer solution INHALE 3 ML VIA NEBULIZER TWICE DAILY AND EVERY 6 HOURS AS NEEDED FOR SHORTNESS OF BREATH AND WHEEZING. 360 mL 14   ??? albuterol HFA 90 mcg/actuation inhaler INHALE 2 PUFFS EVERY FOUR (4) HOURS AS NEEDED FOR WHEEZING. 18 g 11   ??? alcohol swabs PadM USE AS DIRECTED WITH INHALED ANTIBIOTICS 100 each 5   ??? atorvastatin (LIPITOR) 20 MG tablet TAKE 1 TABLET (20 MG TOTAL) BY MOUTH DAILY. 90 tablet 3   ??? aztreonam lysine (CAYSTON) 75 mg/mL Nebu nebulization solution Inhale 1 mL (75 mg total) Three (3) times a day. 28 days on and 28 days off. 84 mL 5   ??? blood-glucose meter kit Check sugars once daily. 1 each 0   ??? blood-glucose meter Misc USE TO CHECK BLOOD SUGAR ONCE DAILY 1 each 0   ??? cetirizine (ZYRTEC) 10 MG tablet TAKE 1 TABLET (10 MG TOTAL) BY MOUTH DAILY. 90 tablet 3   ??? cholecalciferol, vitamin D3, (VITAMIN D3) 2,000 unit cap Take 1 capsule (2,000 Units total) by mouth daily. 30 capsule prn   ??? colistimethate (COLYMYCIN) 150 mg injection INJECT STERILE WATER FOR INJECTION INTO COLISTIN VIAL THEN WITHDRAW FOR DOSE. INHALE OF MIX TWICE DAILY 60 each 5   ??? fexofenadine (ALLEGRA) 180 MG tablet TAKE 1 TABLET (180 MG TOTAL) BY MOUTH DAILY. 90 tablet 3   ??? fluticasone propion-salmeterol (ADVAIR HFA) 230-21 mcg/actuation inhaler INHALE 2 PUFFS BY MOUTH TWICE DAILY 12 g 11   ??? fluticasone propionate (FLONASE) 50 mcg/actuation nasal spray USE 2 SPRAYS INTO EACH NOSTRIL DAILY (Patient taking differently: USE 2 SPRAYS IN EACH DAILY AS NEEDED FOR RHINITIS) 16 g 5   ??? lancets 30 gauge Misc USE TO TEST BLOOD SUGAR ONCE DAILY BEFORE BREAKFAST 100 each 3   ??? lancets Misc 1 each by Other route once daily. Test daily before breakfast. Dispense 90 day supply. 100 each 3   ??? metFORMIN (GLUCOPHAGE-XR) 500 MG 24 hr tablet Take 4 tablets (2,000 mg total) by mouth daily with evening meal. 360 tablet 3   ??? MISCELLANEOUS MEDICAL SUPPLY MISC Frequency:PHARMDIR   Dosage:0.0     Instructions:  Note:CPAP 15 cm H20 with heated humidity for nasal dryness.  Mask: ResMed Quattro size Medium. Dx OSA. Dose: 1     ??? montelukast (SINGULAIR) 10 mg tablet TAKE 1 TABLET BY MOUTH NIGHTLY 90 each 3   ??? nebulizers Misc Provide 1 device  for use with inhaled medication. 1 each 5   ??? nystatin (MYCOSTATIN) 100,000 unit/mL suspension Take 5 mL by mouth Four (4) times a day.      ??? omeprazole (PRILOSEC) 20 MG capsule TAKE 1 CAPSULE BY MOUTH TWICE DAILY 180 capsule 3   ??? ON CALL EXPRESS TEST STRIP Strp USE TO TEST BLOOD SUGAR ONCE DAILY BEFORE BREAKFAST 100 each 3   ??? rizatriptan (MAXALT) 10 MG tablet Take 1 tablet by mouth at onset of migraine. May repeat in 2 hours (do not exceed 3 tablets/30mg  in 24 hours) 10 tablet 11   ??? sertraline (ZOLOFT) 50 MG tablet TAKE 1 TABLET BY MOUTH ONCE DAILY 90 tablet 2   ??? sod bicarb-sod chlor-neti pot (AYR SALINE NASAL NETI RINSE) pkdv Use twice per day     ??? sodium chloride 7% 7 % Nebu INHALE 3 ML VIA NEBUZLIER TWICE DAILY 600 mL PRN   ??? sterile water, PF, Soln Add 1mL sterile water for injection into neb  cup along with colistin. 60 mL 5   ??? sulfamethoxazole-trimethoprim (BACTRIM) 400-80 mg per tablet Take 1 tablet (80 mg of trimethoprim total) by mouth Two (2) times a day. 180 tablet 0   ??? syringe with needle (SYRINGE 3CC/20GX1) 3 mL 20 gauge x 1 Syrg For use with inhaled antibiotic 60 Syringe 5   ??? syringe with needle 3 mL 20 gauge x 1 1/2 Syrg USE AS DIRECTED WITH INHALED ANTIBIOTIC 60 each 26   ??? topiramate (TOPAMAX) 100 MG tablet TAKE 1 TABLET (100 MG TOTAL) BY MOUTH NIGHTLY. 90 tablet 3     No current facility-administered medications for this visit.        Allergies  Reviewed at 12:08 PM      Reactions Comment    Codeine Shortness Of Breath, Rash     Rifampin  Fevers, chills.    Brett Hanna [roflumilast] Other (See Comments) Back pain          Social History     Socioeconomic History   ??? Marital status: Married     Spouse name: Lupita Leash   ??? Number of children: 3   ??? Years of education: None   ??? Highest education level: None   Occupational History     Employer: DISABLED   Social Needs   ??? Financial resource strain: None   ??? Food insecurity:     Worry: None     Inability: None   ??? Transportation needs:     Medical: None     Non-medical: None   Tobacco Use   ??? Smoking status: Never Smoker   ??? Smokeless tobacco: Former Estate agent and Sexual Activity   ??? Alcohol use: No     Alcohol/week: 0.0 standard drinks   ??? Drug use: No   ??? Sexual activity: Yes     Partners: Female   Lifestyle   ??? Physical activity:     Days per week: None     Minutes per session: None   ??? Stress: None   Relationships   ??? Social connections:     Talks on phone: None     Gets together: None     Attends religious service: None     Active member of club or organization: None     Attends meetings of clubs or organizations: None     Relationship status: None   Other Topics Concern   ??? Do you use sunscreen? Not Asked   ??? Tanning bed use? Not Asked   ??? Are you easily burned? Not Asked   ??? Excessive sun exposure? Not Asked   ??? Blistering sunburns? Not Asked   Social History Narrative    Hasn't yet required services for disability. 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         Objective:   Objective   BP 122/66 (BP Site: L Arm, BP Position: Sitting, BP Cuff Size: Medium)  - Pulse 80  - Temp 36.7 ??C (98 ??F) (Oral)  - Wt 85.4 kg (188 lb 4.8 oz)  - SpO2 95% Comment: resting on room air - BMI 30.66 kg/m??   General Appearance:   Overweight Caucasian male appearing comfortable, non-toxic, in no distress, and stated age.   Eyes:  PERRL, conjunctiva clear, EOM's intact. No drainage.   Nose: Nares normal, septum midline. Markedly congested nasal mucosa with mild erythema.  No drainage or polyps.  No sinus tenderness.   Oropharynx: Moist mucus membranes without lesions or  thrush.  Good dentition.  Posterior pharynx clear.  Mallampati Grade IV airway.   Respiratory:   Crackles with expiratory wheezing when coughs.  Coughing provoked with deep breaths. Easy work of breathing without accessory muscle use.     Cardiovascular:  Regular rate and rhythm. S1 and S2 normal. No murmur, rub  or gallop.  Pulses intact and symmetric.  No peripheral cyanosis or edema.   Gastrointestinal:   Abdomen protuberant.    Musculoskeletal: No tenderness or deformity of the chest wall.  Joints normal.   No clubbing.   Skin: No rashes, lesions, skin changes.   Heme/Lymph: No cervical, supraclavicular, submandibular, or suprasternal adenopathy.  No bruising or petechiae.   Neurologic: Alert and oriented x3. No focal neurological deficits.  Normal gait.      Diagnostic Review:   Pulmonary Functions Testing Results:     FEV1 (% predicted) FVC (% predicted) FEV1/FVC   05/12/13 2.92 L (84.6%) 3.48 L (79.5%) 83.9%   06/26/14 2.29 L (66.7%) 2.84 L (64.7%) 80.6%   03/25/17 2.44 L (71.4%) 2.95 L (67.1%) 82.7%   06/29/17 2.34 L (70.2%) 2.68 L (62.1%) 87.3%   11/23/17 2.20 L (65.8%) 2.62 L (60.8%) 84.0%   03/08/18 2.06 L (61.5%) 2.59 L (60.1%) 79.5%   06/21/18 2.04 L (61.6%) 2.76 L (64.4%) 73.9%     Chest CT (03/18/18): Images personally reviewed. Bronchial wall thickening and bronchiectasis primarily involving the airways supplying the lingula and lung bases with associated clustered micronodular and tree-in-bud opacities. With respect to 02/02/2018, there is increased opacification at the deep posterior costophrenic recesses with more volume loss particularly at the left base, favored to represent atelectasis.    Chest CT (02/02/18): Images personally reviewed. Bronchiectasis predominantly in bases of lingula, right middle lobe, and bilateral lower lobes (left greater than right), mild to moderately progressed since 03/30/2017. ?? New and increased nodular and tree-in-bud opacities, now involving all lobes. Patchy consolidations in the bases of the lingula, and lower lobes also stable to mildly increased. Also new left upper lobe nodular opacity with adjacent groundglass, 8 mm, (2:26). No definite reticular opacities, or honeycombing identified. No appreciable air trapping on expiratory imaging.    Chest CT (03/30/17): Images personally reviewed. Interval worsening of bilateral lower lobe bronchiectasis and bronchial wall thickening with new lingular bronchiectasis. Increased clustered groundglass opacities with areas of consolidation in the lower lobes and lingula. Findings may represent sequela of endobronchial infection or impaired mucus clearance.  Bronchiectasis has been present on CT imaging since 06/12/11.    Cultures   Source Bacterial Culture AFB Smear AFB Culture   03/25/17 Sputum 3+ OPF, 3+ Stenotrophomonas negative negative   06/29/17 Sputum OPF negative Overgrown with bacteria/fungus   01/28/18 Sputum 2+ OPF, 2+ GNR glucose non-fermenter, Scedosporium negative 2 morphologies Streptomyces   02/01/18 Sputum 3+ OPF, 1+ GNR glucose non-fermenter, Scedosporium negative Streptomyces   03/02/18 Sputum 3+ OPF, 3+ GNR glucose non-fermenter, Scedosporium negative NGTD     Bronchiectasis Evaluation:  IgG with subclasses:   Lab Results   Component Value Date/Time    IGG 818 01/17/2013 10:52 AM     IgA:   Lab Results   Component Value Date/Time    IGA 109 01/17/2013 10:52 AM     IgM:   Lab Results   Component Value Date/Time    IGM 25 (L) 01/17/2013 10:52 AM     IgE:   Lab Results Component Value Date/Time    IGE 20.9 03/30/2017 11:41 AM    IGE  34.7 11/24/2013 12:34 PM     Specific Titers: Normal Pneumococcal Antibody Titers (07/10/2010, 10/09/2010), Dipheria, and Tetanus (07/10/2010).  HIV:   Lab Results   Component Value Date/Time    HIVAGAB Nonreactive 01/17/2013 10:52 AM     Neutrophil function (08/07/2010): Normal. Not consistent with CGD.    Autoimmune: ANA (08/26/10) - negative.    ANCA:   Lab Results   Component Value Date/Time    PR3ELISA Negative 03/30/2017 11:41 AM    PR3QT 6.4 03/30/2017 11:41 AM    MPO Negative 03/30/2017 11:41 AM    MPOQT 2.2 03/30/2017 11:41 AM     CBC-D:   Lab Results   Component Value Date/Time    WBC 7.5 03/01/2018 10:56 AM    WBC 6.4 01/04/2015 10:32 AM    HGB 15.2 03/01/2018 10:56 AM    HGB 15.9 01/04/2015 10:32 AM    HCT 46.6 03/01/2018 10:56 AM    HCT 45.9 01/04/2015 10:32 AM    PLT 280 03/01/2018 10:56 AM    PLT 223 01/04/2015 10:32 AM    NEUTROABS 5.9 03/01/2018 10:56 AM    NEUTROABS 5.0 03/21/2014 08:36 AM    EOSABS 0.1 03/01/2018 10:56 AM    EOSABS 0.3 03/21/2014 08:36 AM    BANDS 1+ 07/18/2013 12:20 PM     Alpha-1:         CF testing: Sweat test at [facility] (08/19/10): 45mmol/L - not c/w CF.  PCD testing: nNO (09/10/11): 337.13 nl/min Normal and not c/w PCD    Health Care Maintenance:  Most Recent Immunizations   Administered Date(s) Administered   ??? Hep A / Hep B 08/26/2010   ??? Hepatitis B, Adult 09/16/2010   ??? INFLUENZA TIV (TRI) PF (IM) 11/17/2011   ??? Influenza Vaccine Quad (IIV4 PF) 2mo+ injectable 09/06/2018   ??? Influenza Vaccine Quad (IIV4 W/PRESERV) 29MO+ 09/26/2013   ??? Influenza Virus Vaccine, unspecified formulation 10/08/2017   ??? PNEUMOCOCCAL POLYSACCHARIDE 23 11/23/2017   ??? Pneumococcal Conjugate 13-Valent 11/24/2013   ??? TdaP 12/22/2008

## 2018-09-06 NOTE — Unmapped (Addendum)
Try using tennis ball in sock and pinned to back of shirt to help prevent you from sleeping on your back.    Symptoms of a bronchiectasis exacerbation: 48 hours of at least three of the following:  ?? Increased cough  ?? Change in volume or appearance of sputum  ?? Increased sputum purulence  ?? Worsening shortness of breath and/or exercise tolerance  ?? Fatigue and/or malaise  ?? Coughing up blood (hemoptysis)    If you are experiencing some of these symptoms:  ?? Increase the frequency and/or intensity of your airway clearance  ?? Try to submit a sputum sample for bacterial and AFB cultures (at [facility] or locally)  ?? Reach out to me or your local physician.  ?? If you need antibiotics, recommend treating for 14 days.      Please bring any new airway clearance equipment to your next visit to ensure that you are using and caring for it properly.    You received the standard dose quadrivalent influenza vaccine today.     Thank you for allowing me to be a part of your care. Please call the clinic with any questions.    Viona Gilmore, MD, MPH  Pulmonary and Critical Care Medicine  [address]  [id number]  [place], [place] [address]    Thank you for your visit to the [facility] Pulmonary Clinics. You may receive a survey from [facility] regarding your visit today, and we are eager to use this feedback to improve your experience. Thank you for taking the time to fill it out.    Between appointments, you can reach [place] at these numbers:    For appointments or the Pulmonary Nurse: [phone]  Fax: [phone]    For urgent issues after hours:  Hospital Operator: [phone], ask for Pulmonary Fellow on call    For further information, check out the websites below:    [facility] for Bronchiectasis: [email or web address]    World Bronchiectasis Conference in [place] Patient session May 15, 2017: Videos can be watched here.     Bronchiectasis Toolbox (information about airway clearance): DiningCalendar.de    Copy for Individuals with Bronchiectasis and/or NTM through the Bronchiectasis Registry and the COPD Foundation: https://www.mckee-young.org/    Information about Non-tuberculous Mycobacterial Infections:  https://www.ntminfo.org     Interested in clinical trials and other research opportunities?  www.clinicaltrials.gov  The Rare Diseases Clinical Research Network [facility]) Genetic Disorders of  Mucociliary Clearance Consortium [facility]) Contact Registry is a way for patients with disorders of mucociliary clearance (such as bronchiectasis) and their family members to learn about research studies they may be able to join. Participation is completely voluntary and you may choose to withdraw at any time. There is no cost to join the [employer]. For more information or to join the registry please go to the following website:  [place]OpenHouse.hu

## 2018-09-06 NOTE — Unmapped (Signed)
Administered influenza vaccine in left deltoid. Pt tolerated injection well.

## 2018-09-07 MED FILL — LC PLUS MISC: 30 days supply | Qty: 1 | Fill #1 | Status: AC

## 2018-09-07 MED FILL — LC PLUS MISC: 30 days supply | Qty: 1 | Fill #1

## 2018-09-07 MED FILL — VENTOLIN HFA 90 MCG/ACTUATION AEROSOL INHALER: 16 days supply | Qty: 18 | Fill #0 | Status: AC

## 2018-09-07 MED FILL — ALBUTEROL SULFATE 2.5 MG/3 ML (0.083 %) SOLUTION FOR NEBULIZATION: 30 days supply | Qty: 360 | Fill #1 | Status: AC

## 2018-09-07 MED FILL — VENTOLIN HFA 90 MCG/ACTUATION AEROSOL INHALER: 16 days supply | Qty: 18 | Fill #0

## 2018-09-07 MED FILL — ADVAIR HFA 230 MCG-21 MCG/ACTUATION AEROSOL INHALER: RESPIRATORY_TRACT | 30 days supply | Qty: 12 | Fill #1

## 2018-09-07 MED FILL — ALBUTEROL SULFATE 2.5 MG/3 ML (0.083 %) SOLUTION FOR NEBULIZATION: 30 days supply | Qty: 360 | Fill #1

## 2018-09-07 MED FILL — SODIUM CHLORIDE 7 % FOR NEBULIZATION: 90 days supply | Qty: 720 | Fill #0 | Status: AC

## 2018-09-07 MED FILL — ADVAIR HFA 230 MCG-21 MCG/ACTUATION AEROSOL INHALER: 30 days supply | Qty: 12 | Fill #1 | Status: AC

## 2018-09-21 NOTE — Unmapped (Signed)
[facility] Refill Coordination Note    Specialty Medication(s) to be Shipped:   CF/Pulmonary: -Nebulized colistin (150mg  vials) and supply kit    Other medication(s) to be shipped: neb cup, sterile water, syringes  Patient reports that he doesn't need alcohol swabs or any other medicine     Brett Simpler Sr., DOB: 10-Mar-1966  Phone: [phone] (home)       All above HIPAA information was verified with patient.     Completed refill call assessment today to schedule patient's medication shipment from the [facility] [phone]).       Specialty medication(s) and dose(s) confirmed: Regimen is correct and unchanged.   Changes to medications: [name] reports no changes reported at this time.  Changes to insurance: No  Questions for the pharmacist: No    The patient will receive a drug information handout for each medication shipped and additional FDA Medication Guides as required.      DISEASE/MEDICATION-SPECIFIC INFORMATION        Patient said that he didn't need sterile water since he could use 1 vial multiple times.  I counseled him on discarding the vial after the first use to prevent the growth of bacteria.    ADHERENCE     Medication Adherence    Patient reported X missed doses in the last month:  0  Specialty Medication:  Colistin          Support network for adherence:  family member                  MEDICARE PART B DOCUMENTATION     Not Applicable    SHIPPING     Shipping address confirmed in Epic.     Delivery Scheduled: Yes, Expected medication delivery date: 11/26 via UPS or courier.     Medication will be delivered via UPS to the prescription address in Epic WAM.    Clydell Hakim   [facility] Specialty Pharmacist

## 2018-09-27 MED ORDER — WATER FOR INJECTION, STERILE INJECTION SOLUTION
5 refills | 0 days | Status: CP
Start: 2018-09-27 — End: ?
  Filled 2018-09-27: qty 300, 30d supply, fill #1

## 2018-09-27 MED ORDER — COLISTIN (COLISTIMETHATE SODIUM) 150 MG SOLUTION FOR INJECTION
5 refills | 0 days | Status: CP
Start: 2018-09-27 — End: ?
  Filled 2018-09-27: qty 60, 30d supply, fill #1
  Filled 2018-12-01: qty 60, 56d supply, fill #0

## 2018-09-27 MED FILL — BD LUER-LOK SYRINGE 3 ML 20 GAUGE X 1 1/2": 30 days supply | Qty: 60 | Fill #1

## 2018-09-27 MED FILL — STERILE WATER FOR INJECTION: 30 days supply | Qty: 300 | Fill #1 | Status: AC

## 2018-09-27 MED FILL — BD LUER-LOK SYRINGE 3 ML 20 GAUGE X 1 1/2": 30 days supply | Qty: 60 | Fill #1 | Status: AC

## 2018-09-27 MED FILL — LC PLUS MISC: 30 days supply | Qty: 1 | Fill #2

## 2018-09-27 MED FILL — LC PLUS MISC: 30 days supply | Qty: 1 | Fill #2 | Status: AC

## 2018-09-27 MED FILL — COLISTIN (COLISTIMETHATE SODIUM) 150 MG SOLUTION FOR INJECTION: 30 days supply | Qty: 60 | Fill #1 | Status: AC

## 2018-09-27 NOTE — Unmapped (Signed)
Bronchiectasis Clinic Pharmacist Note: Medication Management/Renewals     Sent medication orders for Brett Simpler Sr..    1. Bronchiectasis with acute lower respiratory infection (CMS-HCC)  - colistimethate (COLYMYCIN) 150 mg injection; Inject 2mL SWFI to mix Colistin Vial.Draw up 2mL (150mg ) mixed colistin & add 1mL SWFI for total volume=30mL. Inhale 3mL twice a day,every other month.  Dispense: 60 each; Refill: 5  - sterile water, PF, Soln; Use 2mL to mix colistin, then add 1mL sterile water for injection into neb cup along with colistin.Total volume 3mL  Dispense: 60 mL; Refill: 5      Pharmacy sent to:  [facility]      Anell Barr, PharmD, BCPS, CPP  Clinical Pharmacist Practitioner  [facility] [address] [facility]  [facility]  Pager: [phone]

## 2018-10-11 MED FILL — ATORVASTATIN 20 MG TABLET: 90 days supply | Qty: 90 | Fill #1 | Status: AC

## 2018-10-11 MED FILL — ATORVASTATIN 20 MG TABLET: 90 days supply | Qty: 90 | Fill #1

## 2018-10-11 MED FILL — MONTELUKAST 10 MG TABLET: 90 days supply | Qty: 90 | Fill #1 | Status: AC

## 2018-10-11 MED FILL — MONTELUKAST 10 MG TABLET: ORAL | 90 days supply | Qty: 90 | Fill #1

## 2018-10-12 NOTE — Unmapped (Signed)
[facility] Refill Coordination Note    10/12/18- declined colymycin refill, due to dec off month.- JMG    Specialty Medication(s) to be Shipped:   N/A    Other medication(s) to be shipped: advair , metformin 500mg      [name], DOB: Jul 05, 1966  Phone: [phone] (home)       All above HIPAA information was verified with patient's family member.     Completed refill call assessment today to schedule patient's medication shipment from the [facility] [phone]).       Specialty medication(s) and dose(s) confirmed: Regimen is correct and unchanged.   Changes to medications: [name] reports no changes reported at this time.  Changes to insurance: No  Questions for the pharmacist: No    The patient will receive a drug information handout for each medication shipped and additional FDA Medication Guides as required.      DISEASE/MEDICATION-SPECIFIC INFORMATION        N/A    ADHERENCE     Medication Adherence    Patient reported X missed doses in the last month:  0  Specialty Medication:  colymycin  Patient is on additional specialty medications:  No  Patient is on more than two specialty medications:  No  Any gaps in refill history greater than 2 weeks in the last 3 months:  no          Support network for adherence:  family member              Refill Coordination    Has the Patients' Contact Information Changed:  No  Is the Shipping Address Different:  No         MEDICARE PART B DOCUMENTATION     colymycin: Patient has 3 weeks on hand.    SHIPPING     Shipping address confirmed in Epic.     Delivery Scheduled: Yes, Expected medication delivery date: 10/19/18 via UPS or courier.     Medication will be delivered via UPS to the prescription address in Epic [place].    Drey Shaff Perlie Gold   [facility] Specialty Technician

## 2018-10-18 MED FILL — ADVAIR HFA 230 MCG-21 MCG/ACTUATION AEROSOL INHALER: RESPIRATORY_TRACT | 30 days supply | Qty: 12 | Fill #2

## 2018-10-18 MED FILL — METFORMIN ER 500 MG TABLET,EXTENDED RELEASE 24 HR: ORAL | 90 days supply | Qty: 360 | Fill #1

## 2018-10-18 MED FILL — METFORMIN ER 500 MG TABLET,EXTENDED RELEASE 24 HR: 90 days supply | Qty: 360 | Fill #1 | Status: AC

## 2018-10-18 MED FILL — ADVAIR HFA 230 MCG-21 MCG/ACTUATION AEROSOL INHALER: 30 days supply | Qty: 12 | Fill #2 | Status: AC

## 2018-11-01 ENCOUNTER — Encounter
Admit: 2018-11-01 | Discharge: 2018-11-02 | Payer: PRIVATE HEALTH INSURANCE | Attending: Internal Medicine | Primary: Internal Medicine

## 2018-11-01 ENCOUNTER — Encounter: Admit: 2018-11-01 | Discharge: 2018-11-02 | Payer: PRIVATE HEALTH INSURANCE

## 2018-11-01 DIAGNOSIS — J479 Bronchiectasis, uncomplicated: Principal | ICD-10-CM

## 2018-11-01 DIAGNOSIS — J470 Bronchiectasis with acute lower respiratory infection: Secondary | ICD-10-CM

## 2018-11-01 MED ORDER — SULFAMETHOXAZOLE 400 MG-TRIMETHOPRIM 80 MG TABLET: 1 | tablet | Freq: Two times a day (BID) | 0 refills | 0 days | Status: AC

## 2018-11-01 MED ORDER — SULFAMETHOXAZOLE 400 MG-TRIMETHOPRIM 80 MG TABLET
ORAL_TABLET | Freq: Two times a day (BID) | ORAL | 0 refills | 0.00000 days | Status: CP
Start: 2018-11-01 — End: 2018-11-01

## 2018-11-01 NOTE — Unmapped (Signed)
[facility] [place]  [facility] Bron[facility]ctasis [facility]    Assessment:      Patient:Brett Carver Fila Sr. (02/09/1966)  Reason for visit: Brett Hanna is a 52 y.o.male who returns for follow-up of bronchiectasis secondary to alpha-1 antitrypsin carrier state with prior NTM infection status post treatment and OSA.  Following 3 months of Bactrim, he noticed improvement in his cough and breathing. However, he was recently exposed to a number of sick contacts. He reports increase in cough over past 2 days.     Plan:      Problem List Items Addressed This Visit        Respiratory    Bronchiectasis (CMS-HCC) - Primary    Relevant Medications    sulfamethoxazole-trimethoprim (BACTRIM) 400-80 mg per tablet    Other Relevant Orders    6 - Minute walk (Completed)      Reinforced importance of regular airway clearance.  In bronchiectasis, there is impaired mucus clearance from both the airway changes and from the underlying cause of the bronchiectasis.  This impaired clearance leads to accumulation of mucus, resulting in inflammation, accumulation of bacteria, and worsening damage to the airways.  Therefore, augmentation of airway clearance is critical. Brett Hanna websites with information about bronchiectasis, social forums for patients with bronchiectasis and NTM, and airway clearance techniques    Patient met with the respiratory therapist today to discuss airway clearance methods and discuss home NIPPV equipment. Patient should bring any new airway clearance devices to the next visit to review technique with the respiratory therapist.  No changes were made to airway clearance regimen.  No changes were made to inhaled medications.      Patient was unable to expectorate sputum today to be sent for bacterial and AFB cultures.  If he has future exacerbations, sputum should be sent for bacterial and AFB cultures. The bacterial culture should be processed like a cystic fibrosis sample given the overlapping pathogens. There are standing orders for these cultures in our system.    I restarted him on another 3 month course of Bactrim. We discussed the pros and cons of staying on chronic Bactrim. We agreed to reassess need at next visit after seeing how he does on another 3 months of Bactrim.     Based on 6 minute walk test, Brett Hanna need supplemental oxygen with exertion.    I asked my RT to look into Medicare coverage for a positional device for his sleep apnea.    I encouraged Brett Hanna to schedule a follow up appointment with Brett Hanna for his sinuses.    Brett Hanna to clinic in 3 months for follow-up with pre-bronchodilator spirometry and sputum cultures.  He will call or send me a message via MyChart if questions or concerns arise before this visit.      Subjective:      HPI: Brett Hanna is a 52 y.o. male who returns for follow-up of bronchiectasis secondary to alpha-1 antitrypsin carrier state with prior NTM infection status post treatment and OSA.  Brett Hanna in the [facility] pulmonary clinic for many years until transitioning to Brett Hanna care at [facility] in 2015. He returned to see me on 03/25/17. 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 He was intolerant of rifampin. He completed clarithromycin and ethambutol in July 2018.  At the last clinic visit on 03/08/18, I treated Brett Hanna with Bactrim for 3 weeks to cover Streptomyces.  His symptoms improved until he contacted me on 06/10/18 due to increased cough with coughing spells and an episode of hemoptysis. Based on these symptoms, I started him on Bactrim for a three week course.  His symptoms initially improved and then worsened so based on discussion with ID, I started him on a 3 month course of Bactrim starting Sept 24.    On Monday (10/28), he developed tight breathing, low energy, deep cough, body aches. He used his albuterol an extra two times and performed an extra Vest session on 10/28 and 10/29.  He was expectorating yellow brown sputum and had subjective fever and chills.  His chest was sore from coughing.  He also had some leg stiffness and a bit of nasal drainage. About 1 week before these symptoms, he experienced 3 days of abdominal cramps and diarrhea.    Since his last visit on 09/06/18, he completed his 3 month course of Bactrim (about 5 days ago).  Started to feel better about 2 weeks ago. Notes that over the holidays, he was around many sick relatives. His 50 month old grandson got RSV last week.  Another grandchild with croup and another with ear ache. Wife developed sinus infection and bad bronchitis.    Respiratory Symptoms:  ?? Cough: Hanna coughing if leans forward anymore. Hanna chucks of mucus. Pale in color, less yellow.  ?? Wheezing: present and at times is just as bad as prior. Was worse last week.  ?? Chest tightness: improved and less chest congestion.   ?? Rescue albuterol use: Uses inhaler 2 times per day (with spacer) in addition to the three times a day nebulized (twice a day before HTS). Previously was using inhaler 4 times per day in addition to nebulized albuterol.  ?? Pleurisy: absent.  ?? Hemoptysis: None   ?? MMRC Stage: 3 = I stop for breath after walking about 100 yards or after a few minutes on level ground. Exhausted if walks too much.     Exacerbations:  ?? Exacerbations in past 1 year treated with antibiotics: 5  ?? Dates of exacerbations: Aug 2018 - Minocycline; Feb 2019 - Augmentin; April 2019 - Augmentin; May 2019 - Bactrim; Aug 2019 - Bactrim; Sept 2019 - Bactrim   ?? Admissions for exacerbations in past two years: None  ?? Last hospitalization: Never    Pulmonary Therapies:  ?? Airway clearance: SmartVest twice a day; Aerobika 2 times per day; Hypertonic Saline 7% nebulized 2 times per day.   ?? Inhaled antibiotics: Cycles Colistin and Cayston. He is currently on Colistin.   ?? Exercise: Tries to walk on [employer] when he can  ?? Pulmonary rehab: Referred to [facility] in January  ?? Supplemental oxygen: Hanna needed. He is concerned that he needs oxygen when he walks or talks a lot because he can feel breathless.  ?? NIPPV:  CPAP 14 cm H2O. Recommended to sleep in strict lateral sleep position but insurance wouldn't cover device.    Review of Systems  Congestion and nasal drainage. Performing sinus rinses every 2-3 days; if more frequent, worsens his symptoms. Remainder of a complete review of systems was negative unless mentioned above.    Past Medical History:   Diagnosis Date   ??? Allergic rhinitis    ??? Alpha-1-antitrypsin deficiency carrier     with slightly low levels   ??? Amblyopia    ???  Asthma    ??? Asthma, extrinsic    ??? Bronchiectasis (CMS-HCC) 06/12/2011   ??? Chronic sinusitis    ??? Colon polyps    ??? Diabetes mellitus (CMS-HCC)    ??? GERD (gastroesophageal reflux disease)    ??? H/O Mycobacterium avium complex infection    ??? Impaired glucose tolerance    ??? Obesity    ??? Pneumonia    ??? Pseudomonas respiratory infection    ??? 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 Sleep apnea    ??? Strabismus    ??? Strep throat        Current Outpatient Medications   Medication Sig Dispense Refill   ??? acetaminophen (TYLENOL EXTRA STRENGTH) 500 MG tablet Take 1 tablet by mouth as needed for pain     ??? albuterol 2.5 mg /3 mL (0.083 %) nebulizer solution INHALE 3 ML VIA NEBULIZER TWICE DAILY AND EVERY 6 HOURS AS NEEDED FOR SHORTNESS OF BREATH AND WHEEZING. 360 mL 14   ??? albuterol HFA 90 mcg/actuation inhaler INHALE 2 PUFFS EVERY FOUR (4) HOURS AS NEEDED FOR WHEEZING. 18 g 11   ??? alcohol swabs PadM USE AS DIRECTED WITH INHALED ANTIBIOTICS 100 each 5   ??? atorvastatin (LIPITOR) 20 MG tablet TAKE 1 TABLET (20 MG TOTAL) BY MOUTH DAILY. 90 tablet 3   ??? aztreonam lysine (CAYSTON) 75 mg/mL Nebu nebulization solution Inhale 1 mL (75 mg total) Three (3) times a day. 28 days on and 28 days off. 84 mL 5   ??? blood-glucose meter kit Check sugars once daily. 1 each 0   ??? blood-glucose meter Misc USE TO CHECK BLOOD SUGAR ONCE DAILY 1 each 0   ??? cetirizine (ZYRTEC) 10 MG tablet TAKE 1 TABLET (10 MG TOTAL) BY MOUTH DAILY. 90 tablet 3   ??? cholecalciferol, vitamin D3, (VITAMIN D3) 2,000 unit cap Take 1 capsule (2,000 Units total) by mouth daily. 30 capsule prn   ??? colistimethate (COLYMYCIN) 150 mg injection Inject 2mL SWFI to mix Colistin Vial.Draw up 2mL (150mg ) mixed colistin & add 1mL SWFI for total volume=65mL. Inhale 3mL twice a day,every other month. 60 each 5   ??? fexofenadine (ALLEGRA) 180 MG tablet TAKE 1 TABLET (180 MG TOTAL) BY MOUTH DAILY. 90 tablet 3   ??? fluticasone propion-salmeterol (ADVAIR HFA) 230-21 mcg/actuation inhaler INHALE 2 PUFFS BY MOUTH TWICE DAILY 12 g 11   ??? lancets 30 gauge Misc USE TO TEST BLOOD SUGAR ONCE DAILY BEFORE BREAKFAST 100 each 3   ??? metFORMIN (GLUCOPHAGE-XR) 500 MG 24 hr tablet Take 4 tablets (2,000 mg total) by mouth daily with evening meal. 360 tablet 3   ??? MISCELLANEOUS MEDICAL SUPPLY MISC Frequency:PHARMDIR   Dosage:0.0     Instructions:  Note:CPAP 15 cm H20 with heated humidity for nasal dryness.  Mask: ResMed Quattro size Medium. Dx OSA. Dose: 1     ??? montelukast (SINGULAIR) 10 mg tablet TAKE 1 TABLET BY MOUTH NIGHTLY 90 each 3   ??? nystatin (MYCOSTATIN) 100,000 unit/mL suspension Take 5 mL by mouth Four (4) times a day.      ??? omeprazole (PRILOSEC) 20 MG capsule TAKE 1 CAPSULE BY MOUTH TWICE DAILY 180 capsule 3   ??? ON CALL EXPRESS TEST STRIP Strp USE TO TEST BLOOD SUGAR ONCE DAILY BEFORE BREAKFAST 100 each 3   ??? rizatriptan (MAXALT) 10 MG tablet Take 1 tablet by mouth at onset of migraine. May repeat in 2 hours (do Hanna exceed 3 tablets/30mg  in 24 hours) 10 tablet 11   ???  sod bicarb-sod chlor-neti pot (AYR SALINE NASAL NETI RINSE) pkdv Use twice per day     ??? sodium chloride 7% 7 % Nebu INHALE 3 ML VIA NEBUZLIER TWICE DAILY 600 mL PRN   ??? sterile water, PF, Soln Use 2mL to mix colistin, then add 1mL sterile water for injection into neb cup along with colistin.Total volume 3mL 60 mL 5   ??? syringe with needle (SYRINGE 3CC/20GX1) 3 mL 20 gauge x 1 Syrg For use with inhaled antibiotic 60 Syringe 5   ??? syringe with needle 3 mL 20 gauge x 1 1/2 Syrg USE AS DIRECTED WITH INHALED ANTIBIOTIC 60 each 26   ??? topiramate (TOPAMAX) 100 MG tablet TAKE 1 TABLET (100 MG TOTAL) BY MOUTH NIGHTLY. 90 tablet 3   ??? fluticasone propionate (FLONASE) 50 mcg/actuation nasal spray USE 2 SPRAYS INTO EACH NOSTRIL DAILY (Patient taking differently: USE 2 SPRAYS IN EACH DAILY AS NEEDED FOR RHINITIS) 16 g 5   ??? lancets Misc 1 each by Other route once daily. Test daily before breakfast. Dispense 90 day supply. 100 each 3   ??? nebulizers Misc Provide 1 device  for use with inhaled medication. 1 each 5   ??? sertraline (ZOLOFT) 50 MG tablet TAKE 1 TABLET BY MOUTH ONCE DAILY 90 tablet 2     No current facility-administered medications for this visit.        Allergies  Reviewed on 09/06/2018      Reactions Comment    Codeine Shortness Of Breath, Rash     Rifampin  Fevers, chills.    Daliresp [roflumilast] Other (See Comments) Back pain        Social History     Tobacco Use   ??? Smoking status: Never Smoker   ??? Smokeless tobacco: Former Estate agent Use Topics   ??? Alcohol use: No     Alcohol/week: 0.0 standard drinks   ??? Drug use: No         Objective:   Objective   BP 123/63 (BP Site: L Arm, BP Position: Sitting, BP Cuff Size: Medium)  - Pulse 76  - Temp 36.4 ??C (97.5 ??F) (Oral)  - Ht 167 cm (5' 5.75)  - Wt 86.6 kg (191 lb)  - SpO2 93% Comment: resting on room air - BMI 31.06 kg/m??   General Appearance:   Overweight Caucasian male appearing comfortable, non-toxic, in no distress, and stated age.   Eyes:  PERRL, conjunctiva clear, EOM's intact. No drainage.   Nose: Nares normal, septum midline. Markedly congested nasal mucosa with hyperemia and erythema.  No polyps.  No sinus tenderness.   Oropharynx: Moist mucus membranes without lesions or thrush.  Good dentition.  Posterior pharynx clear.  Mallampati Grade IV airway.   Respiratory:   Crackles over mid and lower lung fields. Short end inspiratory squeak over RML. Coughing provoked with deep breaths. Easy work of breathing without accessory muscle use.     Cardiovascular:  Regular rate and rhythm. S1 and S2 normal. No murmur, rub  or gallop.  Pulses intact and symmetric.  No peripheral cyanosis or edema.   Gastrointestinal:   Abdomen protuberant.    Musculoskeletal: No tenderness or deformity of the chest wall.  Joints normal.   No clubbing.   Skin: No rashes, lesions, skin changes.   Heme/Lymph: No cervical, supraclavicular, submandibular, or suprasternal adenopathy.  No bruising or petechiae.   Neurologic: Alert and oriented x3. No focal neurological deficits.  Normal gait.  Diagnostic Review:   Pulmonary Functions Testing Results:      Measures are suggestive of moderate restriction and are stable from prior.   FEV1 (% predicted) FVC (% predicted) FEV1/FVC   05/12/13 2.92 L (84.6%) 3.48 L (79.5%) 84%   06/26/14 2.29 L (66.7%) 2.84 L (64.7%) 81%   03/25/17 2.44 L (71.4%) 2.95 L (67.1%) 83%   06/29/17 2.34 L (70.2%) 2.68 L (62.1%) 87%   11/23/17 2.20 L (65.8%) 2.62 L (60.8%) 84%   03/08/18 2.06 L (61.5%) 2.59 L (60.1%) 80%   06/21/18 2.04 L (61.6%) 2.76 L (64.4%) 74%   11/01/18 2.08 L (62.7%) 2.63 L (61.4%) 79%     6 Minute walk (11/01/18)      Chest CT (03/18/18): Images personally reviewed. Bronchial wall thickening and bronchiectasis primarily involving the airways supplying the lingula and lung bases with associated clustered micronodular and tree-in-bud opacities. With respect to 02/02/2018, there is increased opacification at the deep posterior costophrenic recesses with more volume loss particularly at the left base, favored to represent atelectasis.    Chest CT (02/02/18): Images personally reviewed. Bronchiectasis predominantly in bases of lingula, right middle lobe, and bilateral lower lobes (left greater than right), mild to moderately progressed since 03/30/2017. ?? New and increased nodular and tree-in-bud opacities, now involving all lobes. Patchy consolidations in the bases of the lingula, and lower lobes also stable to mildly increased. Also new left upper lobe nodular opacity with adjacent groundglass, 8 mm, (2:26). No definite reticular opacities, or honeycombing identified. No appreciable air trapping on expiratory imaging.    Chest CT (03/30/17): Images personally reviewed. Interval worsening of bilateral lower lobe bronchiectasis and bronchial wall thickening with new lingular bronchiectasis. Increased clustered groundglass opacities with areas of consolidation in the lower lobes and lingula. Findings may represent sequela of endobronchial infection or impaired mucus clearance.  Bronchiectasis has been present on CT imaging since 06/12/11.    Cultures   Source Bacterial Culture AFB Smear AFB Culture   03/25/17 Sputum 3+ OPF, 3+ Stenotrophomonas negative negative   06/29/17 Sputum OPF negative Overgrown with bacteria/fungus   01/28/18 Sputum 2+ OPF, 2+ GNR glucose non-fermenter, Scedosporium negative 2 morphologies Streptomyces   02/01/18 Sputum 3+ OPF, 1+ GNR glucose non-fermenter, Scedosporium negative Streptomyces   03/02/18 Sputum 3+ OPF, 3+ GNR glucose non-fermenter, Scedosporium negative Streptomyces     Bronchiectasis Evaluation:  IgG with subclasses:   Lab Results   Component Value Date/Time    IGG 818 01/17/2013 10:52 AM     IgA:   Lab Results   Component Value Date/Time    IGA 109 01/17/2013 10:52 AM     IgM:   Lab Results   Component Value Date/Time    IGM 25 (L) 01/17/2013 10:52 AM     IgE:   Lab Results   Component Value Date/Time    IGE 20.9 03/30/2017 11:41 AM    IGE 34.7 11/24/2013 12:34 PM     Specific Titers: Normal Pneumococcal Antibody Titers (07/10/2010, 10/09/2010), Dipheria, and Tetanus (07/10/2010).  HIV:   Lab Results   Component Value Date/Time    HIVAGAB Nonreactive 01/17/2013 10:52 AM     Neutrophil function (08/07/2010): Normal. Hanna consistent with CGD.    Autoimmune: ANA (08/26/10) - negative.    ANCA:   Lab Results   Component Value Date/Time    PR3ELISA Negative 03/30/2017 11:41 AM    PR3QT 6.4 03/30/2017 11:41 AM    MPO Negative 03/30/2017 11:41 AM    MPOQT 2.2 03/30/2017  11:41 AM     CBC-D:   Lab Results   Component Value Date/Time    WBC 7.5 03/01/2018 10:56 AM    WBC 6.4 01/04/2015 10:32 AM    HGB 15.2 03/01/2018 10:56 AM    HGB 15.9 01/04/2015 10:32 AM    HCT 46.6 03/01/2018 10:56 AM    HCT 45.9 01/04/2015 10:32 AM    PLT 280 03/01/2018 10:56 AM    PLT 223 01/04/2015 10:32 AM    NEUTROABS 5.9 03/01/2018 10:56 AM    NEUTROABS 5.0 03/21/2014 08:36 AM    EOSABS 0.1 03/01/2018 10:56 AM    EOSABS 0.3 03/21/2014 08:36 AM    BANDS 1+ 07/18/2013 12:20 PM     Alpha-1:         CF testing: Sweat test at [facility] (08/19/10): 26mmol/L - Hanna c/w CF.  PCD testing: nNO (09/10/11): 337.13 nl/min Normal and Hanna c/w PCD    Health Care Maintenance:  Most Recent Immunizations   Administered Date(s) Administered   ??? Hep A / Hep B 08/26/2010   ??? Hepatitis B, Adult 09/16/2010   ??? INFLUENZA TIV (TRI) PF (IM) 11/17/2011   ??? Influenza Vaccine Quad (IIV4 PF) 20mo+ injectable 09/06/2018   ??? Influenza Vaccine Quad (IIV4 W/PRESERV) 96MO+ 09/26/2013   ??? Influenza Virus Vaccine, unspecified formulation 10/08/2017   ??? PNEUMOCOCCAL POLYSACCHARIDE 23 11/23/2017   ??? Pneumococcal Conjugate 13-Valent 11/24/2013   ??? TdaP 12/22/2008

## 2018-11-01 NOTE — Unmapped (Signed)
11/01/18- off month for colistin- JMG

## 2018-11-01 NOTE — Unmapped (Addendum)
Reschedule appointment with Dr. Ralene Ok.    Restarting Bactrim for another 3 months.    Getting 6 minute walk.    Symptoms of a bronchiectasis exacerbation: 48 hours of at least three of the following:  ?? Increased cough  ?? Change in volume or appearance of sputum  ?? Increased sputum purulence  ?? Worsening shortness of breath and/or exercise tolerance  ?? Fatigue and/or malaise  ?? Coughing up blood (hemoptysis)    If you are experiencing some of these symptoms:  ?? Increase the frequency and/or intensity of your airway clearance  ?? Try to submit a sputum sample for bacterial and AFB cultures (at [facility] or locally)  ?? Reach out to me or your local physician.  ?? If you need antibiotics, recommend treating for 14 days.      Please bring any new airway clearance equipment to your next visit to ensure that you are using and caring for it properly.    Thank you for allowing me to be a part of your care. Please call the clinic with any questions.    Viona Gilmore, MD, MPH  Pulmonary and Critical Care Medicine  [address]  [id number]  [place], [place] [address]    Thank you for your visit to the [facility] Pulmonary Clinics. You may receive a survey from [employer] regarding your visit today, and we are eager to use this feedback to improve your experience. Thank you for taking the time to fill it out.    Between appointments, you can reach [place] at these numbers:    For appointments or the Pulmonary Nurse: [phone]  Fax: [phone]    For urgent issues after hours:  Hospital Operator: [phone], ask for Pulmonary Fellow on call    For further information, check out the websites below:    [facility] for Bronchiectasis: [email or web address]    Impact Airway Clearance Education: http://www.impact-be.com    World Bronchiectasis Conference in [place] Patient session May 15, 2017: Videos can be watched here.     Bronchiectasis Toolbox (information about airway clearance): DiningCalendar.de    Copy for Individuals with Bronchiectasis and/or NTM through the Bronchiectasis Registry and the COPD Foundation: https://www.mckee-young.org/    Information about Non-tuberculous Mycobacterial Infections:  https://www.ntminfo.org     Interested in clinical trials and other research opportunities?  www.clinicaltrials.gov  The Rare Diseases Clinical Research Network [facility]) Genetic Disorders of  Mucociliary Clearance Consortium [facility]) Contact Registry is a way for patients with disorders of mucociliary clearance (such as bronchiectasis) and their family members to learn about research studies they may be able to join. Participation is completely voluntary and you may choose to withdraw at any time. There is no cost to join the [employer]. For more information or to join the registry please go to the following website:  [email or web address]

## 2018-11-01 NOTE — Unmapped (Signed)
Bronchiectasis Clinic Assessment    Inhaled Medications:      Advair HFA   Albuterol Neb   Albuterol HFA  Cayston    Colistin  Hypertonic Saline 7%      Review Ordered Inhaled Medications that are Taken, Frequency, Compliance: I reviewed techniques and order of treatments.  All questions answered.     Airway Clearance Used:   He is consistently using the Smart Vest.  Frequency 8-12, pressure 30 for 2 15 minute sessions per day.     Cough Techniques:  ACBT and  Huff coughing  reviewed with patient.     Spacer/MDI Training: Brett Hanna has a spacer for his albuterol.  Patient states understanding of how to use the spacer and why it is beneficial.  No further questions at this time.    Bipap/Cpap;Settings if Use: He uses his PAP machine consistently, his insurance no longer covers the CPAP mask liner, he is making his own liners out of t-shirts.  No issues or complications at this time.      Home O2: NA    Equipment Review/Cleaning: Equipment cleaning reviewed.  He cleans his equipment with dawn dish soap.    He uses a baby bottle sterilizer to disinfect his reusable nebulizer cups.      DME Provider:   No needs noted at this time.    Misc. Notes: No needs at this time, patient is compliant with his ACT and verbalizes that using the vest with the 7% HTS and drinking lots of water is beneficial to him.

## 2018-11-11 NOTE — Unmapped (Signed)
Pt called stating he needed a refill on this medication. He was last seen in December.     Thanks so much,  Jonna Munro, RN

## 2018-11-12 NOTE — Unmapped (Signed)
Confirming he wants the script changed from [facility] to Shared Services.  It is currently active at [facility]. Insurance has denied the refill at this time and needs to be appealed.

## 2018-11-14 NOTE — Unmapped (Signed)
[name],    Would you be able to help with this?     Thanks,   Jonna Munro, RN

## 2018-11-15 NOTE — Unmapped (Signed)
We are appealing it already. Where does he want it sent?

## 2018-11-17 NOTE — Unmapped (Signed)
Wife phoned the clinic nurse line. She stated there was an issue with his [place] medication.     She didn't know the exact thing that was  the issue. I told her that we would call the dispensing pharmacy to know the issue and help to assist with getting his medication.

## 2018-11-18 MED ORDER — NEBULIZERS
3 refills | 0 days | Status: CP
Start: 2018-11-18 — End: ?

## 2018-11-18 MED ORDER — FLUTICASONE PROPIONATE 230 MCG-SALMETEROL 21 MCG/ACTUATION HFA INHALER
RESPIRATORY_TRACT | 3 refills | 0 days | Status: CP
Start: 2018-11-18 — End: 2019-11-18
  Filled 2018-12-01: qty 36, 30d supply, fill #0

## 2018-11-18 MED ORDER — SODIUM CHLORIDE 7 % FOR NEBULIZATION
Freq: Two times a day (BID) | RESPIRATORY_TRACT | 3 refills | 90 days | Status: CP
Start: 2018-11-18 — End: 2019-11-18
  Filled 2019-01-30: qty 60, 30d supply, fill #0

## 2018-11-18 NOTE — Unmapped (Signed)
[facility] Pharmacist Note: Appeal for [place] approved    Called and spoke with [name] and let him know appeal was approved for [place].  Provided him [facility]'s phone # to call to schedule delivery.      Anell Barr, PharmD, BCPS, CPP  Clinical Pharmacist Practitioner  [facility]  [facility]  Pager: [phone]      CC:   Calla Kicks, RN

## 2018-11-18 NOTE — Unmapped (Signed)
[facility] Pharmacy: Pharmacist Clinical Assessment Note: Adult Pulmonary    Completed clinical pharmacist assessment today with [name] by phone.     CLINICAL ASSESSMENT     Medication Reconciliation: Reviewed the indication, dose, and frequency of each medication with patient. All medications, allergies, and preferred pharmacy list were updated in EPIC medication profile     Adherence Assessment: Excellent compliance    Drug-drug interactions reviewed: Yes  Interaction identified: No    Side effects: Patient is tolerating medications, no side effects to report.     Counseled patient on the following:   Adherence/missed doses  Cost of medications/cost implications  Doses and administration  Pharmacy contact information  Therapeutic rationale    PLAN     Therapy is appropriate to continue, no changes were made to regimen. Still completing inhaled colistin 150mg  BID every other month. Awaiting results of [place] appeal.    FOLLOW-UP:  Next clinic follow-up: 02/07/2019  Next pharmacy clinical assessment due (in 6 months): 05/19/2019    MEDICATION ACCESS & REFILL COORDINATION     Changes to insurance: No  [facility] continues to manage refill coordination with patient.    Alton Revere, PharmD, BCPS, CPP  Clinical Pharmacist Practitioner  [facility]

## 2018-11-22 NOTE — Unmapped (Signed)
Brett Hanna,    Why does he need a new prescription? Alton Revere sent a new script on 11/18/2018 with 11 refills.

## 2018-11-22 NOTE — Unmapped (Signed)
[facility] Special[facility] Pharm[facility] Refill Coordination Note    Specialty Medication(s) to be Shipped:   CF/Pulmonary: -colymycin    Other medication(s) to be shipped: prilosec 20mg , advair inhaler     Brett Simpler Sr., DOB: 1966-09-19  Phone: [phone] (home)       All above HIPAA information was verified with patient.     Completed refill call assessment today to schedule patient's medication shipment from the [facility] [phone]).       Specialty medication(s) and dose(s) confirmed: Regimen is correct and unchanged.   Changes to medications: [name] reports no changes reported at this time.  Changes to insurance: No  Questions for the pharmacist: No    The patient will receive a drug information handout for each medication shipped and additional FDA Medication Guides as required.      DISEASE/MEDICATION-SPECIFIC INFORMATION        N/A    ADHERENCE     Medication Adherence    Patient reported X missed doses in the last month:  0  Specialty Medication:  colymycin  Patient is on additional specialty medications:  No  Patient is on more than two specialty medications:  No  Any gaps in refill history greater than 2 weeks in the last 3 months:  no          Support network for adherence:  family member              Refill Coordination    Has the Patients' Contact Information Changed:  No  Is the Shipping Address Different:  No         MEDICARE PART B DOCUMENTATION     colymycin: Patient has 0 on hand.    SHIPPING     Shipping address confirmed in Epic.     Delivery Scheduled: Yes, Expected medication delivery date: 12/02/2018 via UPS or courier.     Medication will be delivered via UPS to the prescription address in Epic [place].    [name]   [facility] Specialty Technician

## 2018-11-25 NOTE — Unmapped (Signed)
Pt called the clinic nurse line stating that he is having issues with his sinus'. He states that his nose has been stuffy and he is unable to get it unclogged. He states that when he blows his nose it is yellow. He has a lot of drainage going to the back of his throat, and his ears have been popping and believes they will soon be stopped up. He thinks he may potentially be getting a sinus infection, and sounds like he may already have one. He is doing his sinus rinses, and has been taking all of his medications as prescribed.     Is there anything else that he can take?

## 2018-11-28 MED ORDER — AMOXICILLIN 875 MG-POTASSIUM CLAVULANATE 125 MG TABLET
ORAL_TABLET | Freq: Two times a day (BID) | ORAL | 0 refills | 0 days | Status: CP
Start: 2018-11-28 — End: 2019-01-03

## 2018-11-28 NOTE — Unmapped (Signed)
Sent script for Augmentin to local pharmacy. He should continue his Bactrim and sinus rinses. He should also reach out to his ENT, Dr. Junius Finner.

## 2018-12-01 MED FILL — OMEPRAZOLE 20 MG CAPSULE,DELAYED RELEASE: ORAL | 90 days supply | Qty: 180 | Fill #1

## 2018-12-01 MED FILL — ADVAIR HFA 230 MCG-21 MCG/ACTUATION AEROSOL INHALER: 30 days supply | Qty: 36 | Fill #0 | Status: AC

## 2018-12-01 MED FILL — OMEPRAZOLE 20 MG CAPSULE,DELAYED RELEASE: 90 days supply | Qty: 180 | Fill #1 | Status: AC

## 2018-12-01 MED FILL — COLISTIN (COLISTIMETHATE SODIUM) 150 MG SOLUTION FOR INJECTION: 56 days supply | Qty: 60 | Fill #0 | Status: AC

## 2018-12-20 MED ORDER — TOPIRAMATE 100 MG TABLET
ORAL_TABLET | 3 refills | 0 days | Status: CP
Start: 2018-12-20 — End: 2019-12-20
  Filled 2018-12-21: qty 90, 90d supply, fill #0

## 2018-12-20 MED ORDER — SERTRALINE 50 MG TABLET
ORAL_TABLET | ORAL | 2 refills | 0 days | Status: CP
Start: 2018-12-20 — End: 2019-12-20
  Filled 2018-12-21: qty 90, 90d supply, fill #0

## 2018-12-20 NOTE — Unmapped (Signed)
[facility] Special[facility] Pharm[facility] Refill Coordination Note    12/20/2018- Colistin off cycle month, not filled-[facility]    Specialty Medication(s) to be Shipped: N/A    Other medication(s) to be shipped: zoloft 50mg , topiamax 100mg      Brett Simpler Sr., DOB: Nov 08, 1965  Phone: [phone] (home)       All above HIPAA information was verified with patient's family member.     Completed refill call assessment today to schedule patient's medication shipment from the [facility] [phone]).       Specialty medication(s) and dose(s) confirmed: Regimen is correct and unchanged.   Changes to medications: Brett Hanna reports no changes reported at this time.  Changes to insurance: No  Questions for the pharmacist: No    The patient will receive a drug information handout for each medication shipped and additional FDA Medication Guides as required.      DISEASE/MEDICATION-SPECIFIC INFORMATION        N/A    ADHERENCE     Medication Adherence    Patient reported X missed doses in the last month:  0  Specialty Medication:  colistin- off cycle month  Patient is on additional specialty medications:  No  Patient is on more than two specialty medications:  No  Any gaps in refill history greater than 2 weeks in the last 3 months:  no  Support network for adherence:  family member          Refill Coordination    Has the Patients' Contact Information Changed:  No  Is the Shipping Address Different:  No         MEDICARE PART B DOCUMENTATION     colistin: Patient has 0 on hand.    SHIPPING     Shipping address confirmed in Epic.     Delivery Scheduled: Yes, Expected medication delivery date: 12/22/2018 via UPS or courier.     Medication will be delivered via UPS to the prescription address in [employer] [place].    Brett Hanna   [facility] Specialty Technician

## 2018-12-21 MED FILL — TOPIRAMATE 100 MG TABLET: 90 days supply | Qty: 90 | Fill #0 | Status: AC

## 2018-12-21 MED FILL — SERTRALINE 50 MG TABLET: 90 days supply | Qty: 90 | Fill #0 | Status: AC

## 2019-01-03 ENCOUNTER — Other Ambulatory Visit: Admit: 2019-01-03 | Discharge: 2019-01-03 | Payer: PRIVATE HEALTH INSURANCE

## 2019-01-03 ENCOUNTER — Encounter
Admit: 2019-01-03 | Discharge: 2019-01-03 | Payer: PRIVATE HEALTH INSURANCE | Attending: Student in an Organized Health Care Education/Training Program | Primary: Student in an Organized Health Care Education/Training Program

## 2019-01-03 ENCOUNTER — Ambulatory Visit: Admit: 2019-01-03 | Discharge: 2019-01-03 | Payer: PRIVATE HEALTH INSURANCE

## 2019-01-03 DIAGNOSIS — A498 Other bacterial infections of unspecified site: Principal | ICD-10-CM

## 2019-01-03 DIAGNOSIS — H53009 Unspecified amblyopia, unspecified eye: Principal | ICD-10-CM

## 2019-01-03 DIAGNOSIS — Z5181 Encounter for therapeutic drug level monitoring: Principal | ICD-10-CM

## 2019-01-03 DIAGNOSIS — K635 Polyp of colon: Principal | ICD-10-CM

## 2019-01-03 DIAGNOSIS — E119 Type 2 diabetes mellitus without complications: Principal | ICD-10-CM

## 2019-01-03 DIAGNOSIS — J020 Streptococcal pharyngitis: Principal | ICD-10-CM

## 2019-01-03 DIAGNOSIS — J329 Chronic sinusitis, unspecified: Principal | ICD-10-CM

## 2019-01-03 DIAGNOSIS — R7302 Impaired glucose tolerance (oral): Principal | ICD-10-CM

## 2019-01-03 DIAGNOSIS — J479 Bronchiectasis, uncomplicated: Principal | ICD-10-CM

## 2019-01-03 DIAGNOSIS — B450 Pulmonary cryptococcosis: Principal | ICD-10-CM

## 2019-01-03 DIAGNOSIS — K219 Gastro-esophageal reflux disease without esophagitis: Principal | ICD-10-CM

## 2019-01-03 DIAGNOSIS — Z148 Genetic carrier of other disease: Principal | ICD-10-CM

## 2019-01-03 DIAGNOSIS — J988 Other specified respiratory disorders: Principal | ICD-10-CM

## 2019-01-03 DIAGNOSIS — G4730 Sleep apnea, unspecified: Principal | ICD-10-CM

## 2019-01-03 DIAGNOSIS — E669 Obesity, unspecified: Principal | ICD-10-CM

## 2019-01-03 DIAGNOSIS — Z8619 Personal history of other infectious and parasitic diseases: Principal | ICD-10-CM

## 2019-01-03 DIAGNOSIS — H509 Unspecified strabismus: Principal | ICD-10-CM

## 2019-01-03 DIAGNOSIS — J309 Allergic rhinitis, unspecified: Principal | ICD-10-CM

## 2019-01-03 DIAGNOSIS — J45909 Unspecified asthma, uncomplicated: Principal | ICD-10-CM

## 2019-01-03 DIAGNOSIS — J189 Pneumonia, unspecified organism: Principal | ICD-10-CM

## 2019-01-03 DIAGNOSIS — J310 Chronic rhinitis: Principal | ICD-10-CM

## 2019-01-03 LAB — BASIC METABOLIC PANEL
ANION GAP: 11 mmol/L (ref 7–15)
BLOOD UREA NITROGEN: 17 mg/dL (ref 7–21)
BUN / CREAT RATIO: 18
CALCIUM: 9.7 mg/dL (ref 8.5–10.2)
CHLORIDE: 102 mmol/L (ref 98–107)
CO2: 27 mmol/L (ref 22.0–30.0)
CREATININE: 0.97 mg/dL (ref 0.70–1.30)
EGFR CKD-EPI AA MALE: >90 mL/min/{1.73_m2} (ref >=60)
EGFR CKD-EPI NON-AA MALE: 89 mL/min/{1.73_m2} (ref >=60)
GLUCOSE RANDOM: 93 mg/dL (ref 70–179)
POTASSIUM: 4.4 mmol/L (ref 3.5–5.0)
SODIUM: 140 mmol/L (ref 135–145)

## 2019-01-03 NOTE — Unmapped (Signed)
Otolaryngology Clinic Note    Brett Simpler Sr. is a 53 y.o. male is seen in consultation at the request of Referred Self  for evaluation of chronic sinusitis.        History of Present Illness:     The patient is a 53 y.o. 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 who presents for the evaluation of CRS.  Patient sees Dr. Reuel Boom for bronchiectasis 2/2 alpha-1 antitrypsin carrier state with prior NTM infection and hx of OSA.     Patient reports multiply recurrent sinus infections as well as pulmonary infections.  Is currently on a course of Augmentin for 14 days.  He started this on Tuesday.    Reports chronic nasal obstruction that is bilateral in nature.  This makes wearing his CPAP difficult however he does wear it every night.  Reports in general the settings are high but he does not know what the numbers are.  Report he frequently wakes up with a completely dry mouth.  Sinus standpoint his wife reports his sense of smell is generally intact.  Thick chronic drainage from his nose.  This is usually clear but during exacerbations can become cloudy.  Reports diffuse facial pressure.  He has never had sinus surgery.  He has never had significant facial trauma.  He is currently on disability but [place].    Patient reports is been unable to tolerate a Neomed bottle.  He reports he has lots of ear pressure following this and gets frequent ear infections.    Update 03/08/2018:  Brett Hanna is a 53 y.o. male who last seen in 01/14/2018.  He reports he  has been insistent bilateral nasal obstruction sinus pressure lots of postnasal drip.  Reports he has been irrigating his nose with minimal improvement.  Reports he takes multiple allergy medicines daily.  He is being worked up for some worsening self-limited hemoptysis and wheezing Dr. Garner Nash.  Is been started on a course of Augmentin for 3 weeks based on previous cultures.  She is also obtaining a CT scan.    From a nasal standpoint the patient reports that this takes second priority to his recent increasing shortness of breath however it is very bothersome to him.  CT scan last week.  Been in for follow-up.    Update 01/03/2019:  Brett Hanna is a 53 y.o. male who last seen in 03/08/18.  Reports overall has been doing okay.  He feels like when he is on Bactrim his nose works pretty well.  He has been irrigating his nose every other day.  He reports that when does it he feels that he has increased mucus that day however the next day he has substantial improvement.  He reports he uses room temperature water.  He does not warm it.  He also complains of ear pain when he uses the irrigations.  He is never tried the lavage.  He has tried the [employer] pot and reports this was torture.    A 12 point review of systems was negative except as indicated.  The patient denies fevers, chills, shortness of breath, chest pain, nausea, vomiting, diarrhea, inability to lie flat, dysphagia, odynophagia, hemoptysis, hematemesis, changes in  vision, changes in voice quality, otalgia, otorrhea, vertiginous symptoms, focal deficits, or other concerning symptoms.    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    Past Surgical History     has a past surgical history that includes Bronchoscopy; Lung biopsy; pr bronchoscopy,diagnostic w lavage (Bilateral, 08/21/2013); pr bronchoscopy,diagnostic w brush (Bilateral, 08/21/2013); Shoulder surgery (Right); and pr colonoscopy flx dx w/collj spec when pfrmd (N/A, 03/21/2015).    Current Medications    Current Outpatient Medications   Medication Sig Dispense Refill   ??? fexofenadine (ALLEGRA) 180 MG tablet Take 180 mg by mouth daily.     ??? acetaminophen (TYLENOL EXTRA STRENGTH) 500 MG tablet Take 1 tablet by mouth as needed for pain     ??? albuterol 2.5 mg /3 mL (0.083 %) nebulizer solution INHALE 3 ML VIA NEBULIZER TWICE DAILY AND EVERY 6 HOURS AS NEEDED FOR SHORTNESS OF BREATH AND WHEEZING. 360 mL 14   ??? albuterol HFA 90 mcg/actuation inhaler INHALE 2 PUFFS EVERY FOUR (4) HOURS AS NEEDED FOR WHEEZING. 18 g 11   ??? alcohol swabs PadM USE AS DIRECTED WITH INHALED ANTIBIOTICS 100 each 5   ??? atorvastatin (LIPITOR) 20 MG tablet TAKE 1 TABLET (20 MG TOTAL) BY MOUTH DAILY. 90 tablet 3   ??? aztreonam lysine (CAYSTON) 75 mg/mL Nebu nebulization solution Inhale 1 mL (75 mg total) Three (3) times a day. 28 days on and 28 days off. 84 mL 5   ??? blood-glucose meter kit Check sugars once daily. 1 each 0   ??? blood-glucose meter Misc USE TO CHECK BLOOD SUGAR ONCE DAILY 1 each 0   ??? cetirizine (ZYRTEC) 10 MG tablet TAKE 1 TABLET (10 MG TOTAL) BY MOUTH DAILY. 90 tablet 3   ??? cholecalciferol, vitamin D3, (VITAMIN D3) 2,000 unit cap Take 1 capsule (2,000 Units total) by mouth daily. 30 capsule prn   ??? colistimethate (COLYMYCIN) 150 mg injection Inject 2mL SWFI to mix Colistin Vial.Draw up 2mL (150mg ) mixed colistin & add 1mL SWFI for total volume=66mL. Inhale 3mL twice a day,every other month. 60 each 5   ??? fluticasone propion-salmeterol (ADVAIR HFA) 230-21 mcg/actuation inhaler INHALE 2 PUFFS BY MOUTH TWICE DAILY 36 g 3   ??? fluticasone propionate (FLONASE) 50 mcg/actuation nasal spray USE 2 SPRAYS INTO EACH NOSTRIL DAILY (Patient taking differently: USE 2 SPRAYS IN EACH DAILY AS NEEDED FOR RHINITIS) 16 g 5   ??? lancets 30 gauge Misc USE TO TEST BLOOD SUGAR ONCE DAILY BEFORE BREAKFAST 100 each 3   ??? lancets Misc 1 each by Other route once daily. Test daily before breakfast. Dispense 90 day supply. 100 each 3   ??? metFORMIN (GLUCOPHAGE-XR) 500 MG 24 hr tablet Take 4 tablets (2,000 mg total) by mouth daily with evening meal. (Patient taking differently: Take 1,000 mg by mouth two (2) times a day. ) 360 tablet 3   ??? MISCELLANEOUS MEDICAL SUPPLY MISC Frequency:PHARMDIR   Dosage:0.0     Instructions:  Note:CPAP 15 cm H20 with heated humidity for nasal dryness.  Mask: ResMed Quattro size Medium. Dx OSA. Dose: 1     ??? montelukast (SINGULAIR) 10 mg tablet TAKE 1 TABLET BY MOUTH NIGHTLY 90 each 3   ???  nebulizers Misc use with inhaled medication. 3 each 3   ??? nystatin (MYCOSTATIN) 100,000 unit/mL suspension Take 5 mL by mouth Four (4) times a day.      ??? omeprazole (PRILOSEC) 20 MG capsule TAKE 1 CAPSULE BY MOUTH TWICE DAILY 180 capsule 3   ??? ON CALL EXPRESS TEST STRIP Strp USE TO TEST BLOOD SUGAR ONCE DAILY BEFORE BREAKFAST 100 each 3   ??? rizatriptan (MAXALT) 10 MG tablet Take 1 tablet by mouth at onset of migraine. May repeat in 2 hours (do not exceed 3 tablets/30mg  in 24 hours) 10 tablet 11   ??? sertraline (ZOLOFT) 50 MG tablet TAKE 1 TABLET BY MOUTH ONCE DAILY 90 tablet 2   ??? sod bicarb-sod chlor-neti pot (AYR SALINE NASAL NETI RINSE) pkdv Use twice per day     ??? sodium chloride 7% 7 % Nebu Inhale 4 mL by nebulization Two (2) times a day. 720 mL 3   ??? sterile water, PF, Soln Use 2mL to mix colistin, then add 1mL sterile water for injection into neb cup along with colistin.Total volume 3mL 60 mL 5   ??? sulfamethoxazole-trimethoprim (BACTRIM) 400-80 mg per tablet Take 1 tablet (80 mg of trimethoprim total) by mouth Two (2) times a day. 180 tablet 0   ??? syringe with needle 3 mL 20 gauge x 1 1/2 Syrg USE AS DIRECTED WITH INHALED ANTIBIOTIC 60 each 26   ??? topiramate (TOPAMAX) 100 MG tablet TAKE 1 TABLET (100 MG TOTAL) BY MOUTH NIGHTLY. 90 tablet 3     No current facility-administered medications for this visit.        Allergies    Allergies   Allergen Reactions   ??? Codeine Shortness Of Breath and Rash   ??? Rifampin      Fevers, chills.   ??? Daliresp [Roflumilast] Other (See Comments)     Back pain       Family History    Negative for bleeding disorders or free bleeding.     family history includes Colon cancer in his father; Diabetes in his paternal grandfather; Emphysema in his maternal uncle; Heart disease in his maternal uncle.    Social History:     reports that he has never smoked. He has quit using smokeless tobacco.   reports no history of alcohol use.   reports no history of drug use.    Review of Systems    A 12 system review of systems was performed and is negative other than that noted in the history of present illness.    Vital Signs  Height 170.2 cm (5' 7.01), weight 83.9 kg (185 lb).     Physical Exam    General: Well-developed, well-nourished. Appropriate, comfortable, and in no apparent distress.  Head/Face: On external examination there is no obvious asymmetry or scars. On palpation there is no tenderness over maxillary sinuses or masses within the salivary glands. Cranial nerves V and VII are intact through all distributions.  Eyes: PERRL, EOMI, the conjunctiva are not injected and sclera is non-icteric.  Ears: On external exam, there is no obvious lesions or asymmetry. The EACs are bilaterally without cerumen or lesions. The TMs are in the neutral position and are mobile to pneumatic otoscopy bilaterally. There are no middle ear masses or fluid noted. Hearing is grossly intact bilaterally.  Nose: On external exam there are neither lesions nor asymmetry of the nasal tip/ dorsum. On anterior rhinoscopy, visualization posteriorly is limited on anterior examination. For this reason, to adequately evaluate  posteriorly for masses, polypoid disease and/or signs of infections, nasal endoscopy is indicated (see procedure below).  Oral cavity/oropharynx: The mucosa of the lips, gums, hard and soft palate, posterior pharyngeal wall, tongue, floor of mouth, and buccal region are without masses or lesions and are normally hydrated. Good dentition. Tongue protrudes midline. Tonsils are normal appearing. Supraglottis not visualized due to gag reflex.  Neck: There is no asymmetry or masses. Trachea is midline. There is no enlargement of the thyroid or palpable thyroid nodules.   Lymphatics: There is no palpable lymphadenopathy along the jugulodiagastric, submental, or posterior cervical chains.  Chest: No audible wheeze, unlabored respirations.  Cardiovascular: Regular rate.  GI: Nondistended.  Neurologic: Cranial nerve???s II-XII are grossly intact. Exam is non-focal.  Extremities: No cyanosis, clubbing or edema.    Procedures:    Sinonasal Endoscopy (CPT [id number]): To better evaluate the patient???s symptoms, sinonasal endoscopy is indicated.  After discussion of risks and benefits, and topical decongestion and anesthesia, an endoscope was used to perform nasal endoscopy on each side. A time out identifying the patient, the procedure, the location of the procedure and any concerns was performed prior to beginning the procedure.    Findings:   Copious clear mucus.  There is a long septal shelf along the inferior turbinate on the left.  Mucosa mildly inflamed.  There is no purulence.  No polyps or pus.      Examination on the right reveals a broad anterior septal deviation to the right.  This obstructs visualization of the middle turbinate.  The inflamed mucosa.  There is copious clear mucus.  There is no mucopurulence or no polyps or pus.      Oretha Ellis Nasal Endoscopy Score    Left        ?? Polyps:  Absent (0)   ?? Edema:   Mild (1)   ?? Discharge:  Clear, Thin (1)    ?? Scarring:  Absent (0)   ?? Crusting:  None (0)      Total Left:  2     Right         ?? Polyps:  Absent (0)   ?? Edema:  Mild (1)   ?? Discharge: Clear, Thin (1)    ?? Scarring:  Absent (0)   ?? Crusting:  None (0)      Total Right:   2      Labs and Diagnostic Tests  An MRI was reviewed from 2015 that was obtained for other reasons.  This demonstrates a broad right-sided septal deviation.  No substantial sinus disease was noted on this limited study.    CT scan was personally reviewed and gone over with the patient.  This demonstrates a prominent septal spur and shelf along the left side abutting the inferior turbinate.  On the right it shows substantial inferior turbinate hypertrophy with anterior deflection.      Assessment:  The patient is a 53 y.o. 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 who presents for the evaluation of: Nasal obstruction chronic sinusitis, septal deviation, instructive sleep apnea    Recommendations:  1. Brett Hanna has inflammation in the nasal cavity noted on nasal endoscopy exam today in clinic.  We discussed  that surgery could likely improve his nasal breathing however uncertain how this would affect his facial pain and pressure.  We also discussed management strategies for his nasal obstruction and encouraged him to try and lavage.  This likely would have the ear pain and pressure that he experiences with a Neomed rinse bottle.  Furthermore we discussed warming the saline so that was body temperature prior to installation to the so it does not increase mucus production temporarily.    The patient voiced complete understanding of plan as detailed above and is in full agreement.

## 2019-01-03 NOTE — Unmapped (Signed)
Assessment and Plan    1. Type 2 diabetes mellitus without complication, without long-term current use of insulin (CMS-HCC)    2. Medication monitoring encounter    3. Bronchiectasis without complication (CMS-HCC)        #1. DM2. A1C improving from last check. Tolerating metformin XR better with taking it bid.   -He will make follow up with his eye doctors.    #2. Bronchiectasis. Following closely with pulmonology. Symptoms have been somewhat improved since starting bactrim.     #3. Anxiety. Well controlled currently on sertraline. Used to have frequent panic symptoms and now infrequently.     #4. Migraines. Currently well controlled.     #5. Chronic sinusitis. Recent abx. Is using saline rinses. Has appt with ENT today.    #6. OSA. Continue CPAP which does help.    Orders Placed This Encounter   Procedures   ??? Basic Metabolic Panel   ??? POCT A1C - RN Obtain         Follow up: Return in about 3 months (around 04/05/2019), or if symptoms worsen or fail to improve.        Reason for visit: Follow-up  ,     Brett Simpler Sr. is a 53 y.o. male with bronchiectasis, DM2, htn, HL, asthma, migraines who presents for follow up.    HPI  Had some recent sinus problems. Was put on abx. Has appt with ENT this afternoon. Does nasal rinses 2-3 time a week. Helps loosen it up. Feels like it's better if he doesn't do it every day.    Still normal lung problems. Thinks the bactrim has helped. Has helped with tightness and wheezing in chest. Thinks lungs are gradually getting worse.     Does think the stress feeling isn't as bad as it used to be with the sertraline.     He has tolerated the metformin bid better than taking it all at once.     Medications were reviewed    Physical Exam  Gen: well appearing, NAD  Vitals:    01/03/19 1053   BP: 123/74   Pulse: 63   Resp: 18   Temp: 36.4 ??C (97.5 ??F)   TempSrc: Oral   SpO2: 94%   Weight: 84.1 kg (185 lb 4.8 oz)   Height: 170.2 cm (5' 7)     CV: RRR, no mrg  Pulm: mod air movement, less wheezing than usual  Ext: no edema  Skin: warm and dry    PHQ9: 3  GAD7: 1    A1C 7.4    Medication adherence and barriers to the treatment plan have been addressed. Opportunities to optimize healthy behaviors have been discussed. Patient / caregiver voiced understanding.

## 2019-01-03 NOTE — Unmapped (Addendum)
See ENT today  Your A1C is better from 7.8 to 7.4. this good.  Check labs  Make follow up with eye doctor

## 2019-01-03 NOTE — Unmapped (Signed)
Notified of results on mychart.

## 2019-01-10 DIAGNOSIS — J470 Bronchiectasis with acute lower respiratory infection: Principal | ICD-10-CM

## 2019-01-10 MED ORDER — CETIRIZINE 10 MG TABLET
ORAL_TABLET | 3 refills | 0 days | Status: CP
Start: 2019-01-10 — End: 2019-04-07
  Filled 2019-01-12: qty 90, 90d supply, fill #0

## 2019-01-10 MED ORDER — SULFAMETHOXAZOLE 400 MG-TRIMETHOPRIM 80 MG TABLET
ORAL_TABLET | Freq: Two times a day (BID) | ORAL | 0 refills | 0.00000 days | Status: CP
Start: 2019-01-10 — End: 2019-03-10
  Filled 2019-01-12: qty 180, 90d supply, fill #0

## 2019-01-10 NOTE — Unmapped (Signed)
[facility] Pharmacy Refill Coordination Note    Specialty Medication(s) to be Shipped: N/A    Other medication(s) to be shipped:advair inhaler, lipitor 20mg , zyrtec 10mg , metformin 500mg ,singular 10mg , bactrim 400mg     Declined colistin, next cycle starts 01/30/2019     Brett Hanna Sr., DOB: 03/29/1966  Phone: [phone] (home)       All above HIPAA information was verified with patient.     Completed refill call assessment today to schedule patient's medication shipment from the [facility] [phone]).       Specialty medication(s) and dose(s) confirmed: Regimen is correct and unchanged.   Changes to medications: [name] reports no changes reported at this time.  Changes to insurance: No  Questions for the pharmacist: No    Confirmed patient received Welcome Packet with first shipment. The patient will receive a drug information handout for each medication shipped and additional FDA Medication Guides as required.       DISEASE/MEDICATION-SPECIFIC INFORMATION        N/A    SPECIALTY MEDICATION ADHERENCE     Medication Adherence    Patient reported X missed doses in the last month:  0  Specialty Medication:  colistin  Patient is on additional specialty medications:  No  Patient is on more than two specialty medications:  No  Any gaps in refill history greater than 2 weeks in the last 3 months:  no  Support network for adherence:  family member                colistin 150 mg: 0 days of medicine on hand       SHIPPING     Shipping address confirmed in Epic.     Delivery Scheduled: Yes, Expected medication delivery date: 01/13/2019.     Medication will be delivered via UPS to the prescription address in [place].    Brett Hanna Brett Hanna   [facility] Shared [facility] Specialty Technician

## 2019-01-12 MED FILL — ADVAIR HFA 230 MCG-21 MCG/ACTUATION AEROSOL INHALER: 90 days supply | Qty: 36 | Fill #1 | Status: AC

## 2019-01-12 MED FILL — ADVAIR HFA 230 MCG-21 MCG/ACTUATION AEROSOL INHALER: RESPIRATORY_TRACT | 90 days supply | Qty: 36 | Fill #1

## 2019-01-12 MED FILL — CETIRIZINE 10 MG TABLET: 90 days supply | Qty: 90 | Fill #0 | Status: AC

## 2019-01-12 MED FILL — METFORMIN ER 500 MG TABLET,EXTENDED RELEASE 24 HR: ORAL | 90 days supply | Qty: 360 | Fill #2

## 2019-01-12 MED FILL — MONTELUKAST 10 MG TABLET: ORAL | 90 days supply | Qty: 90 | Fill #2

## 2019-01-12 MED FILL — MONTELUKAST 10 MG TABLET: 90 days supply | Qty: 90 | Fill #2 | Status: AC

## 2019-01-12 MED FILL — METFORMIN ER 500 MG TABLET,EXTENDED RELEASE 24 HR: 90 days supply | Qty: 360 | Fill #2 | Status: AC

## 2019-01-12 MED FILL — ATORVASTATIN 20 MG TABLET: 90 days supply | Qty: 90 | Fill #2

## 2019-01-12 MED FILL — ATORVASTATIN 20 MG TABLET: 90 days supply | Qty: 90 | Fill #2 | Status: AC

## 2019-01-12 MED FILL — SULFAMETHOXAZOLE 400 MG-TRIMETHOPRIM 80 MG TABLET: 90 days supply | Qty: 180 | Fill #0 | Status: AC

## 2019-01-26 DIAGNOSIS — J470 Bronchiectasis with acute lower respiratory infection: Principal | ICD-10-CM

## 2019-01-26 NOTE — Unmapped (Signed)
Pt called the clinic nurse line stating that he has been coughing now for about 3-4 weeks and has bad coughing spells about 3-4 times a day. With that he states that he coughs up tan/brown mucous mixed with blood. He states he has been coughing up small amounts of blood mixed in his mucous for that period of time. He remains afebrile, and denies SOB, but states that he can feel his breathing change more recently. He has been doing his airway clearance as he should.     He states that he doesn't have any one who would be able to drop off a sputum sample for him, and is uncomfortable going out with the COVID going around.     He is asking what course of treatment you recommend.     Thanks,   Jonna Munro, RN

## 2019-01-26 NOTE — Unmapped (Signed)
[facility] Special[facility] Pharmacy Refill Coordination Note    Specialty Medication(s) to be Shipped:   CF/Pulmonary: -colistin    Other medication(s) to be shipped: alcohol pads, SWI, 3ml syringes,neb cups     Brett Simpler Sr., DOB: 09-21-1966  Phone: [phone] (home)       All above HIPAA information was verified with patient's family member.     Completed refill call assessment today to schedule patient's medication shipment from the [facility] [phone]).       Specialty medication(s) and dose(s) confirmed: Regimen is correct and unchanged.   Changes to medications: Brett Hanna reports no changes reported at this time.  Changes to insurance: No  Questions for the pharmacist: No    Confirmed patient received Welcome Packet with first shipment. The patient will receive a drug information handout for each medication shipped and additional FDA Medication Guides as required.       DISEASE/MEDICATION-SPECIFIC INFORMATION        N/A    SPECIALTY MEDICATION ADHERENCE     Medication Adherence    Patient reported X missed doses in the last month:  0  Specialty Medication:  colistin  Patient is on additional specialty medications:  No  Patient is on more than two specialty medications:  No  Any gaps in refill history greater than 2 weeks in the last 3 months:  no  Support network for adherence:  family member                colistin 150 mg: 0 days of medicine on hand          SHIPPING     Shipping address confirmed in Epic.     Delivery Scheduled: Yes, Expected medication delivery date: 01/30/2019.     Medication will be delivered via UPS to the prescription address in [place].    Brett Hanna Brett Hanna   [facility] Shared [facility] Specialty Technician

## 2019-01-27 MED ORDER — AMOXICILLIN 875 MG-POTASSIUM CLAVULANATE 125 MG TABLET
ORAL_TABLET | Freq: Two times a day (BID) | ORAL | 0 refills | 0 days | Status: CP
Start: 2019-01-27 — End: 2019-02-10

## 2019-01-27 NOTE — Unmapped (Signed)
Spoke with patient.  Reiterated that for 3 to 4 weeks, has been expectorating brown-tan sputum that can taste of blood and has small amounts (less than 50%) of blood in sputum.  He typically is on the expectorating this kind of blood when he has cough attacks.  Cough attacks can be triggered if he leans over to tie his shoe or is then laying on his left lung.  Sinuses are better since he has been using his sinus rinse daily.  Using Ventolin a couple of times per day.  On Cayston now and due to switch to colistin.  Does not feel particularly sick.  Has been taking his Bactrim, Singulair, and cetirizine regularly.    We agreed that he would try using Benadryl at bedtime as it is possible that his symptoms are triggered by the pollen levels. I also sent a script for Augmentin to his local pharmacy to have on hand if symptoms worsen. He will let me know if symptoms worsen in the next week.

## 2019-01-30 MED FILL — STERILE WATER FOR INJECTION: 30 days supply | Qty: 60 | Fill #0 | Status: AC

## 2019-01-30 MED FILL — COLISTIN (COLISTIMETHATE SODIUM) 150 MG SOLUTION FOR INJECTION: 56 days supply | Qty: 60 | Fill #1 | Status: AC

## 2019-01-30 MED FILL — LC PLUS MISC: 30 days supply | Qty: 3 | Fill #0 | Status: AC

## 2019-01-30 MED FILL — LC PLUS MISC: 30 days supply | Qty: 3 | Fill #0

## 2019-01-30 MED FILL — COLISTIN (COLISTIMETHATE SODIUM) 150 MG SOLUTION FOR INJECTION: 56 days supply | Qty: 60 | Fill #1

## 2019-01-30 MED FILL — ALCOHOL PREP PADS: 50 days supply | Qty: 100 | Fill #0

## 2019-01-30 MED FILL — ALCOHOL PREP PADS: 50 days supply | Qty: 100 | Fill #0 | Status: AC

## 2019-01-30 MED FILL — BD LUER-LOK SYRINGE 3 ML 20 GAUGE X 1": 30 days supply | Qty: 60 | Fill #0

## 2019-01-30 MED FILL — BD LUER-LOK SYRINGE 3 ML 20 GAUGE X 1": 30 days supply | Qty: 60 | Fill #0 | Status: AC

## 2019-03-10 MED ORDER — SULFAMETHOXAZOLE 400 MG-TRIMETHOPRIM 80 MG TABLET
ORAL_TABLET | Freq: Two times a day (BID) | ORAL | 0 refills | 0.00000 days | Status: CP
Start: 2019-03-10 — End: 2019-06-13
  Filled 2019-03-15: qty 180, 90d supply, fill #0

## 2019-03-13 MED FILL — OMEPRAZOLE 20 MG CAPSULE,DELAYED RELEASE: ORAL | 90 days supply | Qty: 180 | Fill #2

## 2019-03-13 MED FILL — OMEPRAZOLE 20 MG CAPSULE,DELAYED RELEASE: 90 days supply | Qty: 180 | Fill #2 | Status: AC

## 2019-03-15 MED FILL — SULFAMETHOXAZOLE 400 MG-TRIMETHOPRIM 80 MG TABLET: 90 days supply | Qty: 180 | Fill #0 | Status: AC

## 2019-03-30 NOTE — Unmapped (Signed)
Called patient and left a message letting him know I was calling about an appointment he has scheduled with Dr. Rosita Fire next Friday and offered to change to video instead of in-person. I asked for a call back.

## 2019-03-31 MED ORDER — BD LUER-LOK SYRINGE 3 ML 20 GAUGE X 1"
0 refills | 0 days | Status: CP
Start: 2019-03-31 — End: ?
  Filled 2019-04-04: qty 60, 30d supply, fill #0

## 2019-03-31 NOTE — Unmapped (Signed)
[facility] Shared [facility] Pharmacy Clinical Assessment & Refill Coordination Note    Brett Simpler Sr., DOB: 06/30/1966  Phone: [phone] (home)     All above HIPAA information was verified with patient.     Specialty Medication(s):   CF/Pulmonary: -Nebulized colistin (150mg  vials) and supply kit     Current Outpatient Medications   Medication Sig Dispense Refill   ??? acetaminophen (TYLENOL EXTRA STRENGTH) 500 MG tablet Take 1 tablet by mouth as needed for pain     ??? albuterol 2.5 mg /3 mL (0.083 %) nebulizer solution INHALE 3 ML VIA NEBULIZER TWICE DAILY AND EVERY 6 HOURS AS NEEDED FOR SHORTNESS OF BREATH AND WHEEZING. 360 mL 14   ??? albuterol HFA 90 mcg/actuation inhaler INHALE 2 PUFFS EVERY FOUR (4) HOURS AS NEEDED FOR WHEEZING. 18 g 11   ??? alcohol swabs PadM USE AS DIRECTED WITH INHALED ANTIBIOTICS 100 each 5   ??? atorvastatin (LIPITOR) 20 MG tablet TAKE 1 TABLET (20 MG TOTAL) BY MOUTH DAILY. 90 tablet 3   ??? aztreonam lysine (CAYSTON) 75 mg/mL Nebu nebulization solution Inhale 1 mL (75 mg total) Three (3) times a day. 28 days on and 28 days off. 84 mL 5   ??? blood-glucose meter kit Check sugars once daily. 1 each 0   ??? blood-glucose meter Misc USE TO CHECK BLOOD SUGAR ONCE DAILY 1 each 0   ??? cetirizine (ZYRTEC) 10 MG tablet TAKE 1 TABLET (10 MG TOTAL) BY MOUTH DAILY. 90 tablet 3   ??? cholecalciferol, vitamin D3, (VITAMIN D3) 2,000 unit cap Take 1 capsule (2,000 Units total) by mouth daily. 30 capsule prn   ??? colistimethate (COLYMYCIN) 150 mg injection Inject 2mL SWFI to mix Colistin Vial.Draw up 2mL (150mg ) mixed colistin & add 1mL SWFI for total volume=73mL. Inhale 3mL twice a day,every other month. 60 each 5   ??? fexofenadine (ALLEGRA) 180 MG tablet Take 180 mg by mouth daily.     ??? fluticasone propion-salmeteroL (ADVAIR HFA) 230-21 mcg/actuation inhaler INHALE 2 PUFFS BY MOUTH TWICE DAILY 36 g 3   ??? fluticasone propionate (FLONASE) 50 mcg/actuation nasal spray USE 2 SPRAYS INTO EACH NOSTRIL DAILY (Patient taking differently: USE 2 SPRAYS IN EACH DAILY AS NEEDED FOR RHINITIS) 16 g 5   ??? lancets Misc 1 each by Other route once daily. Test daily before breakfast. Dispense 90 day supply. 100 each 3   ??? metFORMIN (GLUCOPHAGE-XR) 500 MG 24 hr tablet Take 4 tablets (2,000 mg total) by mouth daily with evening meal. (Patient taking differently: Take 1,000 mg by mouth two (2) times a day. ) 360 tablet 3   ??? MISCELLANEOUS MEDICAL SUPPLY MISC Frequency:PHARMDIR   Dosage:0.0     Instructions:  Note:CPAP 15 cm H20 with heated humidity for nasal dryness.  Mask: ResMed Quattro size Medium. Dx OSA. Dose: 1     ??? montelukast (SINGULAIR) 10 mg tablet TAKE 1 TABLET BY MOUTH NIGHTLY 90 each 3   ??? nebulizers Misc use with inhaled medication. 3 each 3   ??? nystatin (MYCOSTATIN) 100,000 unit/mL suspension Take 5 mL by mouth Four (4) times a day.      ??? omeprazole (PRILOSEC) 20 MG capsule TAKE 1 CAPSULE BY MOUTH TWICE DAILY 180 capsule 3   ??? rizatriptan (MAXALT) 10 MG tablet Take 1 tablet by mouth at onset of migraine. May repeat in 2 hours (do not exceed 3 tablets/30mg  in 24 hours) 10 tablet 11   ??? sertraline (ZOLOFT) 50 MG tablet TAKE 1  TABLET BY MOUTH ONCE DAILY 90 tablet 2   ??? sod bicarb-sod chlor-neti pot (AYR SALINE NASAL NETI RINSE) pkdv Use twice per day     ??? sodium chloride 7% 7 % Nebu Inhale 4 mL by nebulization Two (2) times a day. 720 mL 3   ??? sterile water, PF, Soln Use 2mL to mix colistin, then add 1mL sterile water for injection into neb cup along with colistin.Total volume 3mL 60 mL 5   ??? sulfamethoxazole-trimethoprim (BACTRIM) 400-80 mg per tablet Take 1 tablet by mouth twice daily. 180 tablet 0   ??? syringe with needle (BD LUER-LOK SYRINGE) 3 mL 20 gauge x 1 Syrg Use as directed with inhaled antibiotics. 1480 each 0   ??? topiramate (TOPAMAX) 100 MG tablet TAKE 1 TABLET (100 MG TOTAL) BY MOUTH NIGHTLY. 90 tablet 3     No current facility-administered medications for this visit.         Changes to medications: [name] reports no changes at this time.    Allergies   Allergen Reactions   ??? Codeine Shortness Of Breath and Rash   ??? Rifampin      Fevers, chills.   ??? Daliresp [Roflumilast] Other (See Comments)     Back pain       Changes to allergies: No    SPECIALTY MEDICATION ADHERENCE     Colistin 150 mg: 0 days of medicine on hand , will start June 1 or whenever this shipment arrives        Medication Adherence    Patient reported X missed doses in the last month:  0  Specialty Medication:  Colistin 150mg   Patient is on additional specialty medications:  No  Informant:  patient  Support network for adherence:  family member          Specialty medication(s) dose(s) confirmed: Regimen is correct and unchanged.     Are there any concerns with adherence? No    Adherence counseling provided? Not needed    CLINICAL MANAGEMENT AND INTERVENTION      Clinical Benefit Assessment:    Do you feel the medicine is effective or helping your condition? Yes    Clinical Benefit counseling provided? Not needed    Adverse Effects Assessment:    Are you experiencing any side effects? No    Are you experiencing difficulty administering your medicine? No    Quality of Life Assessment:    How many days over the past month did your bronchiectasis  keep you from your normal activities? For example, brushing your teeth or getting up in the morning. 0    Have you discussed this with your provider? Not needed    Therapy Appropriateness:    Is therapy appropriate? Yes, therapy is appropriate and should be continued    DISEASE/MEDICATION-SPECIFIC INFORMATION      N/A    PATIENT SPECIFIC NEEDS     ? Does the patient have any physical, cognitive, or cultural barriers? No    ? Is the patient high risk? No     ? Does the patient require a Care Management Plan? No     ? Does the patient require physician intervention or other additional services (i.e. nutrition, smoking cessation, social work)? No      SHIPPING     Specialty Medication(s) to be Shipped: CF/Pulmonary: -Nebulized colistin (150mg  vials) and supply kit    Other medication(s) to be shipped: Needles, neb cup, alcohol swab, sterile water, HTS 7% topamax 100mg , sertraline 50mg , singulair  10mg , metformin er 500, flonase, zyrtec 10mg , atorvastatin 20mg , albuterol hfa, advair 230/21      Changes to insurance: No    Delivery Scheduled: Yes, Expected medication delivery date: 04/05/2019.     Medication will be delivered via UPS to the confirmed home address in [facility].    The patient will receive a drug information handout for each medication shipped and additional FDA Medication Guides as required.  Verified that patient has previously received a Conservation officer, historic buildings.    All of the patient's questions and concerns have been addressed.    Julianne Rice   [facility] Shared [facility] Specialty Pharmacist

## 2019-04-03 MED ORDER — ATORVASTATIN 20 MG TABLET
ORAL_TABLET | 3 refills | 0 days | Status: CP
Start: 2019-04-03 — End: 2020-04-02
  Filled 2019-04-04: qty 90, 90d supply, fill #0

## 2019-04-03 MED ORDER — ALBUTEROL SULFATE HFA 90 MCG/ACTUATION AEROSOL INHALER
11 refills | 0 days | Status: CP
Start: 2019-04-03 — End: 2020-04-02
  Filled 2019-04-04: qty 18, 17d supply, fill #0

## 2019-04-03 MED ORDER — MONTELUKAST 10 MG TABLET
ORAL | 3 refills | 0 days | Status: CP
Start: 2019-04-03 — End: 2020-04-02
  Filled 2019-04-04: qty 90, 90d supply, fill #0

## 2019-04-03 MED ORDER — FLUTICASONE PROPIONATE 50 MCG/ACTUATION NASAL SPRAY,SUSPENSION
NASAL | 5 refills | 0 days | Status: CP
Start: 2019-04-03 — End: 2020-04-02
  Filled 2019-04-04: qty 16, 30d supply, fill #0

## 2019-04-04 MED FILL — ADVAIR HFA 230 MCG-21 MCG/ACTUATION AEROSOL INHALER: 90 days supply | Qty: 36 | Fill #2 | Status: AC

## 2019-04-04 MED FILL — SERTRALINE 50 MG TABLET: ORAL | 90 days supply | Qty: 90 | Fill #1

## 2019-04-04 MED FILL — FLUTICASONE PROPIONATE 50 MCG/ACTUATION NASAL SPRAY,SUSPENSION: 30 days supply | Qty: 16 | Fill #0 | Status: AC

## 2019-04-04 MED FILL — ALCOHOL PREP PADS: 50 days supply | Qty: 100 | Fill #1

## 2019-04-04 MED FILL — COLISTIN (COLISTIMETHATE SODIUM) 150 MG SOLUTION FOR INJECTION: 60 days supply | Qty: 60 | Fill #2 | Status: AC

## 2019-04-04 MED FILL — VENTOLIN HFA 90 MCG/ACTUATION AEROSOL INHALER: 17 days supply | Qty: 18 | Fill #0 | Status: AC

## 2019-04-04 MED FILL — SODIUM CHLORIDE 7 % FOR NEBULIZATION: RESPIRATORY_TRACT | 30 days supply | Qty: 240 | Fill #0

## 2019-04-04 MED FILL — BD LUER-LOK SYRINGE 3 ML 20 GAUGE X 1": 30 days supply | Qty: 60 | Fill #0 | Status: AC

## 2019-04-04 MED FILL — WATER FOR INJECTION, STERILE INJECTION SOLUTION: 30 days supply | Qty: 60 | Fill #1 | Status: AC

## 2019-04-04 MED FILL — SODIUM CHLORIDE 7 % FOR NEBULIZATION: 30 days supply | Qty: 240 | Fill #0 | Status: AC

## 2019-04-04 MED FILL — CETIRIZINE 10 MG TABLET: 90 days supply | Qty: 90 | Fill #1

## 2019-04-04 MED FILL — TOPIRAMATE 100 MG TABLET: 90 days supply | Qty: 90 | Fill #1 | Status: AC

## 2019-04-04 MED FILL — METFORMIN ER 500 MG TABLET,EXTENDED RELEASE 24 HR: 90 days supply | Qty: 360 | Fill #3 | Status: AC

## 2019-04-04 MED FILL — LC PLUS MISC: 30 days supply | Qty: 2 | Fill #1

## 2019-04-04 MED FILL — ADVAIR HFA 230 MCG-21 MCG/ACTUATION AEROSOL INHALER: RESPIRATORY_TRACT | 90 days supply | Qty: 36 | Fill #2

## 2019-04-04 MED FILL — METFORMIN ER 500 MG TABLET,EXTENDED RELEASE 24 HR: ORAL | 90 days supply | Qty: 360 | Fill #3

## 2019-04-04 MED FILL — SERTRALINE 50 MG TABLET: 90 days supply | Qty: 90 | Fill #1 | Status: AC

## 2019-04-04 MED FILL — ALCOHOL PREP PADS: 50 days supply | Qty: 100 | Fill #1 | Status: AC

## 2019-04-04 MED FILL — ATORVASTATIN 20 MG TABLET: 90 days supply | Qty: 90 | Fill #0 | Status: AC

## 2019-04-04 MED FILL — WATER FOR INJECTION, STERILE INJECTION SOLUTION: 30 days supply | Qty: 60 | Fill #1

## 2019-04-04 MED FILL — MONTELUKAST 10 MG TABLET: 90 days supply | Qty: 90 | Fill #0 | Status: AC

## 2019-04-04 MED FILL — LC PLUS MISC: 30 days supply | Qty: 2 | Fill #1 | Status: AC

## 2019-04-04 MED FILL — COLISTIN (COLISTIMETHATE SODIUM) 150 MG SOLUTION FOR INJECTION: 60 days supply | Qty: 60 | Fill #2

## 2019-04-04 MED FILL — CETIRIZINE 10 MG TABLET: 90 days supply | Qty: 90 | Fill #1 | Status: AC

## 2019-04-04 MED FILL — TOPIRAMATE 100 MG TABLET: 90 days supply | Qty: 90 | Fill #1

## 2019-04-07 ENCOUNTER — Institutional Professional Consult (permissible substitution): Admit: 2019-04-07 | Discharge: 2019-04-08 | Payer: PRIVATE HEALTH INSURANCE

## 2019-04-07 DIAGNOSIS — J309 Allergic rhinitis, unspecified: Principal | ICD-10-CM

## 2019-04-07 DIAGNOSIS — F419 Anxiety disorder, unspecified: Secondary | ICD-10-CM

## 2019-04-07 DIAGNOSIS — Z148 Genetic carrier of other disease: Secondary | ICD-10-CM

## 2019-04-07 DIAGNOSIS — E119 Type 2 diabetes mellitus without complications: Secondary | ICD-10-CM

## 2019-04-07 MED ORDER — FEXOFENADINE 180 MG TABLET
ORAL_TABLET | Freq: Every day | ORAL | 3 refills | 0.00000 days | Status: CP
Start: 2019-04-07 — End: ?
  Filled 2019-04-07: qty 90, 90d supply, fill #0

## 2019-04-07 MED FILL — ALLERGY RELIEF (FEXOFENADINE) 180 MG TABLET: 90 days supply | Qty: 90 | Fill #0 | Status: AC

## 2019-04-07 NOTE — Unmapped (Signed)
Internal Medicine Phone Visit    This visit is conducted via telephone.    Contact Information  Person Contacted: Patient  Contact Phone number: [phone] (home)   Is there someone else in the room? Yes. What is your relationship? wife. Do you want this person here for the visit? yes.  Patient agreed to a phone visit    [name] is a 53 y.o. male  participating in a telephone encounter.    Reason for Call  Bronchiectasis, allergic rhinitis    Subjective:  Mostly staying home.  In March was having worsening symptoms. Got better. Went away. Took augmentin.  Still coughing up a lot of phlegm but not blood.  Sinuses are 'Ok. Using sinus rinses. Alternates the zyrtec and the allegra.  Having trouble finding mask so wearing nothing.    Doing about the same. Thinks bactrim has helped more than other meds have. Has noticed more of a difference with that than most other medicines he's tried.    Does have the stress feeling but not as much as before.    Has strategies to remember to take his medicines at the same time.    Multiple questions about COVID.    I have reviewed the problem list, medications, and allergies and have updated/reconciled them if needed.    Objective:  Gen: Well sounding, raspy voice, NAD  Respiratory: speaking in full sentences, no respiratory distress    Reviewed pulmonary follow up  Reviewed ENT note  A1C 3/20 was 7.4    Assessment & Plan:  1. Allergic rhinitis, unspecified seasonality, unspecified trigger    2. Alpha-1-antitrypsin deficiency carrier    3. Type 2 diabetes mellitus without complication, without long-term current use of insulin (CMS-HCC)    4. Anxiety      1. Allergic rhinitis. Cont regular nasal rinses. Continue nonsedating antihistamine. Sent in refills.  2. Anxiety. Better controlled. Cont sertraline  3. Advance Care Planning     Participants: Dr. Rosita Fire, Carver Fila  Discussion/Action: Wife would be HCPOA. Doesn't want to go until it's his time. Worries about getting too much pain medicine at end of life and dying sooner because of it.    Health Care Decision Maker as of 04/07/2019    HCDM (patient stated preference): Sande Brothers - Spouse - [phone]       4. Bronchiectasis. Stable. Symptoms improved currently. Following w/pulm. Emphasized measures to avoid exposure to COVID. He hadn't been able to find a mask. Can use cloth mask if can't find surgical mask and most importantly to avoid public spaces.        Return in about 3 months (around 07/08/2019), or if symptoms worsen or fail to improve.       I spent 24 minutes on the phone with the patient. I spent an additional 5 minutes on pre- and post-visit activities.     The patient was physically located in [place] or a state in which I am permitted to provide care. The patient and/or parent/gauardian understood that s/he may incur co-pays and cost sharing, and agreed to the telemedicine visit. The visit was completed via phone and/or video, which was appropriate and reasonable under the circumstances given the patient's presentation at the time.    The patient and/or parent/guardian has been advised of the potential risks and limitations of this mode of treatment (including, but not limited to, the absence of in-person examination) and has agreed to be treated using telemedicine. The patient's/patient's family's questions regarding telemedicine have been  answered.     If the phone/video visit was completed in an ambulatory setting, the patient and/or parent/guardian has also been advised to contact their provider???s office for worsening conditions, and seek emergency medical treatment and/or call 911 if the patient deems either necessary.

## 2019-04-07 NOTE — Unmapped (Signed)
Previsit complete    Time spent on phone: 10 min    Reason for visit: f/u     Questions / Concerns that need to be addressed:    General Consent to Treat (GCT) for non-epic video visits only: Verbal consent    Vitals signs, if any:       Diabetes:  ? Regularly checking blood sugars?: no  o If yes, when? Complete log  Date Before Breakfast After Breakfast Before Lunch After Lunch Before Dinner After Dinner Before Bed                                                                                                                                 Hypertension:  ? Have blood pressure cuff at home?: no  ? Regularly checking blood pressure?: no  If yes, complete log  Date Time BP Pulse                                                                           Weight:    Date          Allergies reviewed: Yes    Medication reviewed: Yes    Refills needed, if any:     Travel Screening: completed    Falls Risk: not applicable    Hark (DV) Screening: not completed    HCDM reviewed and updated in Epic:  Completed or Not completed (Reason yes)      HARK Screening       AUDIT       PHQ2  PHQ-2 Total Score : 0    PHQ9          GAD7       COPD Assessment

## 2019-04-07 NOTE — Unmapped (Signed)
I recommend wearing a mask when going in public

## 2019-05-17 MED ORDER — ALCOHOL SWABS
5 refills | 0 days | Status: CP
Start: 2019-05-17 — End: 2020-05-16
  Filled 2019-05-23: qty 100, 100d supply, fill #0

## 2019-05-17 NOTE — Unmapped (Signed)
[facility] Specialty Pharmacy Refill Coordination Note    Specialty Medication(s) to be Shipped:   CF/Pulmonary: -Nebulized colistin (150mg  vials) and supply kit    Other medication(s) to be shipped: syringes, sterile water, neb cup, alcohol pads, flonase, HTS 7% , Ventolin HFA      Brett Simpler Sr., DOB: February 14, 1966  Phone: [phone] (home)       All above HIPAA information was verified with patient.     Completed refill call assessment today to schedule patient's medication shipment from the [facility] [phone]).       Specialty medication(s) and dose(s) confirmed: Regimen is correct and unchanged.   Changes to medications: [name] reports no changes at this time.  Changes to insurance: No  Questions for the pharmacist: No    Confirmed patient received Welcome Packet with first shipment. The patient will receive a drug information handout for each medication shipped and additional FDA Medication Guides as required.       DISEASE/MEDICATION-SPECIFIC INFORMATION        N/A    SPECIALTY MEDICATION ADHERENCE     Medication Adherence    Patient reported X missed doses in the last month: 2  Specialty Medication: colistin 150mg    Patient is on additional specialty medications: No  Informant: patient  Support network for adherence: family member              colistin 150 mg/ml: 0 days of medicine on hand , start in August         SHIPPING     Shipping address confirmed in Epic.     Delivery Scheduled: Yes, Expected medication delivery date: 05/24/2019.     Medication will be delivered via UPS to the home address in [place].    Julianne Rice   [facility] Shared [facility] Specialty Pharmacist

## 2019-05-23 MED FILL — BD LUER-LOK SYRINGE 3 ML 20 GAUGE X 1": 30 days supply | Qty: 60 | Fill #1

## 2019-05-23 MED FILL — SODIUM CHLORIDE 7 % FOR NEBULIZATION: RESPIRATORY_TRACT | 30 days supply | Qty: 240 | Fill #1

## 2019-05-23 MED FILL — LC PLUS MISC: 30 days supply | Qty: 2 | Fill #2 | Status: AC

## 2019-05-23 MED FILL — COLISTIN (COLISTIMETHATE SODIUM) 150 MG SOLUTION FOR INJECTION: 60 days supply | Qty: 60 | Fill #3

## 2019-05-23 MED FILL — VENTOLIN HFA 90 MCG/ACTUATION AEROSOL INHALER: 17 days supply | Qty: 18 | Fill #1

## 2019-05-23 MED FILL — VENTOLIN HFA 90 MCG/ACTUATION AEROSOL INHALER: 17 days supply | Qty: 18 | Fill #1 | Status: AC

## 2019-05-23 MED FILL — COLISTIN (COLISTIMETHATE SODIUM) 150 MG SOLUTION FOR INJECTION: 60 days supply | Qty: 60 | Fill #3 | Status: AC

## 2019-05-23 MED FILL — SODIUM CHLORIDE 7 % FOR NEBULIZATION: 30 days supply | Qty: 240 | Fill #1 | Status: AC

## 2019-05-23 MED FILL — WATER FOR INJECTION, STERILE INJECTION SOLUTION: 30 days supply | Qty: 60 | Fill #2 | Status: AC

## 2019-05-23 MED FILL — LC PLUS MISC: 30 days supply | Qty: 2 | Fill #2

## 2019-05-23 MED FILL — WATER FOR INJECTION, STERILE INJECTION SOLUTION: 30 days supply | Qty: 60 | Fill #2

## 2019-05-23 MED FILL — FLUTICASONE PROPIONATE 50 MCG/ACTUATION NASAL SPRAY,SUSPENSION: NASAL | 30 days supply | Qty: 16 | Fill #1

## 2019-05-23 MED FILL — FLUTICASONE PROPIONATE 50 MCG/ACTUATION NASAL SPRAY,SUSPENSION: 30 days supply | Qty: 16 | Fill #1 | Status: AC

## 2019-05-23 MED FILL — BD LUER-LOK SYRINGE 3 ML 20 GAUGE X 1": 30 days supply | Qty: 60 | Fill #1 | Status: AC

## 2019-05-23 MED FILL — ALCOHOL SWABS: 100 days supply | Qty: 100 | Fill #0 | Status: AC

## 2019-06-28 MED ORDER — OMEPRAZOLE 20 MG CAPSULE,DELAYED RELEASE
ORAL_CAPSULE | Freq: Two times a day (BID) | ORAL | 3 refills | 90 days | Status: CP
Start: 2019-06-28 — End: 2020-06-27
  Filled 2019-06-30: qty 180, 90d supply, fill #0

## 2019-06-28 NOTE — Unmapped (Signed)
Signed nebulizer orders faxed over to [facility].     Fax #: [phone]  Phone #: [phone]    Fax confirmation received.

## 2019-06-30 MED FILL — MONTELUKAST 10 MG TABLET: 90 days supply | Qty: 90 | Fill #1 | Status: AC

## 2019-06-30 MED FILL — MONTELUKAST 10 MG TABLET: ORAL | 90 days supply | Qty: 90 | Fill #1

## 2019-06-30 MED FILL — OMEPRAZOLE 20 MG CAPSULE,DELAYED RELEASE: 90 days supply | Qty: 180 | Fill #0 | Status: AC

## 2019-06-30 MED FILL — FLUTICASONE PROPIONATE 50 MCG/ACTUATION NASAL SPRAY,SUSPENSION: 30 days supply | Qty: 16 | Fill #2 | Status: AC

## 2019-06-30 MED FILL — ATORVASTATIN 20 MG TABLET: 90 days supply | Qty: 90 | Fill #1 | Status: AC

## 2019-06-30 MED FILL — ATORVASTATIN 20 MG TABLET: 90 days supply | Qty: 90 | Fill #1

## 2019-06-30 MED FILL — FLUTICASONE PROPIONATE 50 MCG/ACTUATION NASAL SPRAY,SUSPENSION: NASAL | 30 days supply | Qty: 16 | Fill #2

## 2019-07-14 NOTE — Unmapped (Signed)
Signed O2 order faxed over to [facility].    Fax #: [phone]    Fax confirmation received.

## 2019-07-20 DIAGNOSIS — J470 Bronchiectasis with acute lower respiratory infection: Secondary | ICD-10-CM

## 2019-07-20 MED ORDER — METFORMIN ER 500 MG TABLET,EXTENDED RELEASE 24 HR
ORAL_TABLET | Freq: Every day | ORAL | 3 refills | 90 days | Status: CP
Start: 2019-07-20 — End: 2020-07-19
  Filled 2019-08-01: qty 360, 90d supply, fill #0

## 2019-07-20 NOTE — Unmapped (Signed)
[facility] Specialty Pharmacy Refill Coordination Note    Specialty Medication(s) to be Shipped:   CF/Pulmonary: -Nebulized colistin (150mg  vials) and supply kit  Other medication(s) to be shipped: Advair HFA 230-53mcg, BD Syrings, LC Plus Neb Cups, Metformin 500mg , Sertraline 50mg , Sodium Chloride 7%, Sterile Water, Sulfa-Trime 400-80mg , Topiramate 100mg  & Ventolin HFA     Brett Simpler Sr., DOB: 08-16-1966  Phone: [phone] (home)     All above HIPAA information was verified with patient.     Completed refill call assessment today to schedule patient's medication shipment from the [facility] [phone]).       Specialty medication(s) and dose(s) confirmed: Regimen is correct and unchanged.   Changes to medications: [name] reports no changes at this time.  Changes to insurance: No  Questions for the pharmacist: No    Confirmed patient received Welcome Packet with first shipment. The patient will receive a drug information handout for each medication shipped and additional FDA Medication Guides as required.       DISEASE/MEDICATION-SPECIFIC INFORMATION        For CF patients: CF [facility] Active? No-not enrolled    SPECIALTY MEDICATION ADHERENCE     Medication Adherence    Patient reported X missed doses in the last month: 0  Specialty Medication: Colistimethate 150mg  inj  Patient is on additional specialty medications: No  Support network for adherence: family member        Colistimethate 150mg /inj : 0 days of medicine on hand     SHIPPING     Shipping address confirmed in Epic.     Delivery Scheduled: Yes, Expected medication delivery date: 08/01/2019.     Medication will be delivered via UPS to the home address in [employer] [place].    Vern Prestia P Allena Katz   [facility] Shared [facility] Specialty Technician

## 2019-07-21 MED ORDER — SULFAMETHOXAZOLE 400 MG-TRIMETHOPRIM 80 MG TABLET
ORAL_TABLET | Freq: Two times a day (BID) | ORAL | 0 refills | 90 days | Status: CP
Start: 2019-07-21 — End: 2019-10-30
  Filled 2019-08-04: qty 180, 90d supply, fill #0

## 2019-08-01 MED FILL — BD LUER-LOK SYRINGE 3 ML 20 GAUGE X 1": 30 days supply | Qty: 60 | Fill #2

## 2019-08-01 MED FILL — SODIUM CHLORIDE 7 % FOR NEBULIZATION: RESPIRATORY_TRACT | 30 days supply | Qty: 240 | Fill #2

## 2019-08-01 MED FILL — COLISTIN (COLISTIMETHATE SODIUM) 150 MG SOLUTION FOR INJECTION: 60 days supply | Qty: 60 | Fill #4 | Status: AC

## 2019-08-01 MED FILL — VENTOLIN HFA 90 MCG/ACTUATION AEROSOL INHALER: 17 days supply | Qty: 18 | Fill #2

## 2019-08-01 MED FILL — LC PLUS MISC: 30 days supply | Qty: 2 | Fill #3

## 2019-08-01 MED FILL — SERTRALINE 50 MG TABLET: ORAL | 90 days supply | Qty: 90 | Fill #2

## 2019-08-01 MED FILL — SODIUM CHLORIDE 7 % FOR NEBULIZATION: 30 days supply | Qty: 240 | Fill #2 | Status: AC

## 2019-08-03 DIAGNOSIS — J471 Bronchiectasis with (acute) exacerbation: Secondary | ICD-10-CM

## 2019-08-03 DIAGNOSIS — J479 Bronchiectasis, uncomplicated: Secondary | ICD-10-CM

## 2019-08-03 MED ORDER — AMOXICILLIN 875 MG-POTASSIUM CLAVULANATE 125 MG TABLET
ORAL_TABLET | Freq: Two times a day (BID) | ORAL | 0 refills | 14.00000 days | Status: CP
Start: 2019-08-03 — End: 2019-08-17

## 2019-08-03 NOTE — Unmapped (Signed)
Patient phoned the clinic nurse line.     He reports that his cough spells have gotten worse again and is also feeling slighly out of breath-feels winded when walking. His cough is loose and he has increased amount of sputum.     His sputum is brown with more green now and occasional blood when he is coughing a lot.     He denies any fevers. He does report wheezing and pain on left side when he is lying down at time. His CPAP does alleviate some he said.     He continues with all his respiratory medications and regimen including an additional albuterol neb for his wheezing.     He reports that the cough didn't really improve with the Cayston. He is due to switch to his Colymycin tomorrow.     He feels like it may be time for another course of antibiotics.     Discussed that I would relay information to Dr. Garner Nash to further advise.

## 2019-08-03 NOTE — Unmapped (Signed)
Ordering Augmentin, which is what he received back in March for an exacerbation. Would like to get a sputum culture if possible since it has been over a year since his last one. Placed standing orders for sputum cultures to be done at [facility].

## 2019-08-04 DIAGNOSIS — A498 Other bacterial infections of unspecified site: Secondary | ICD-10-CM

## 2019-08-04 DIAGNOSIS — J479 Bronchiectasis, uncomplicated: Secondary | ICD-10-CM

## 2019-08-04 MED FILL — COLISTIN (COLISTIMETHATE SODIUM) 150 MG SOLUTION FOR INJECTION: 60 days supply | Qty: 60 | Fill #4

## 2019-08-04 MED FILL — WATER FOR INJECTION, STERILE INJECTION SOLUTION: 30 days supply | Qty: 600 | Fill #3

## 2019-08-04 MED FILL — ADVAIR HFA 230 MCG-21 MCG/ACTUATION AEROSOL INHALER: 90 days supply | Qty: 36 | Fill #3 | Status: AC

## 2019-08-04 MED FILL — WATER FOR INJECTION, STERILE INJECTION SOLUTION: 30 days supply | Qty: 600 | Fill #3 | Status: AC

## 2019-08-04 MED FILL — TOPIRAMATE 100 MG TABLET: 90 days supply | Qty: 90 | Fill #2

## 2019-08-04 MED FILL — SERTRALINE 50 MG TABLET: 90 days supply | Qty: 90 | Fill #2 | Status: AC

## 2019-08-04 MED FILL — TOPIRAMATE 100 MG TABLET: 90 days supply | Qty: 90 | Fill #2 | Status: AC

## 2019-08-04 MED FILL — SULFAMETHOXAZOLE 400 MG-TRIMETHOPRIM 80 MG TABLET: 90 days supply | Qty: 180 | Fill #0 | Status: AC

## 2019-08-04 MED FILL — ADVAIR HFA 230 MCG-21 MCG/ACTUATION AEROSOL INHALER: RESPIRATORY_TRACT | 90 days supply | Qty: 36 | Fill #3

## 2019-08-04 MED FILL — VENTOLIN HFA 90 MCG/ACTUATION AEROSOL INHALER: 17 days supply | Qty: 18 | Fill #2 | Status: AC

## 2019-08-04 MED FILL — BD LUER-LOK SYRINGE 3 ML 20 GAUGE X 1": 30 days supply | Qty: 60 | Fill #2 | Status: AC

## 2019-08-04 MED FILL — METFORMIN ER 500 MG TABLET,EXTENDED RELEASE 24 HR: 90 days supply | Qty: 360 | Fill #0 | Status: AC

## 2019-08-04 NOTE — Unmapped (Signed)
Pharmacy Refill Request    Cayston- 28 days on and 28 days off.     Last OV: 11/01/2018    Last written Rx: 07/12/2018    Last dispensed: 06/27/2019      Upcoming appointment: 09/05/2019      Thank you

## 2019-08-04 NOTE — Unmapped (Signed)
Spututm orders faxed over to [facility] in [place].     Fax #: [phone]    Fax confirmation received.

## 2019-08-07 ENCOUNTER — Encounter: Admit: 2019-08-07 | Discharge: 2019-08-08 | Payer: PRIVATE HEALTH INSURANCE

## 2019-08-07 DIAGNOSIS — J479 Bronchiectasis, uncomplicated: Secondary | ICD-10-CM

## 2019-08-07 MED ORDER — CAYSTON 75 MG/ML SOLUTION FOR NEBULIZATION
Freq: Three times a day (TID) | RESPIRATORY_TRACT | 5 refills | 25 days | Status: CP
Start: 2019-08-07 — End: 2019-08-08

## 2019-08-07 NOTE — Unmapped (Signed)
Sputum specimen collected, delivered to lab.

## 2019-08-08 DIAGNOSIS — J479 Bronchiectasis, uncomplicated: Secondary | ICD-10-CM

## 2019-08-08 DIAGNOSIS — A498 Other bacterial infections of unspecified site: Secondary | ICD-10-CM

## 2019-08-08 MED ORDER — CAYSTON 75 MG/ML SOLUTION FOR NEBULIZATION
Freq: Three times a day (TID) | RESPIRATORY_TRACT | 5 refills | 28.00000 days
Start: 2019-08-08 — End: ?

## 2019-08-22 NOTE — Unmapped (Signed)
Hello,  [name] phoned and requested results of his sputum test.     He is unable to pull up his MyChart right now.     Please let us know if you need [place] to relay anything to patient.     Thank you

## 2019-08-28 DIAGNOSIS — J479 Bronchiectasis, uncomplicated: Principal | ICD-10-CM

## 2019-08-31 NOTE — Unmapped (Addendum)
Mr. Dorann Ou phoned the clinic nurse line today.     He has had an increase in cough, wheezing and occassional blood in sputum.     He just hasn't felt like himself lately.     He is doing all his medications and treatments as prescribed.     He reports that he was on oral antibiotics and helped for a while but no feels like he is worsening.    Discussed that I would relay this to Dr. Garner Nash to further advise.

## 2019-09-01 MED ORDER — CIPROFLOXACIN 750 MG TABLET
ORAL_TABLET | Freq: Two times a day (BID) | ORAL | 0 refills | 14 days | Status: CP
Start: 2019-09-01 — End: ?

## 2019-09-02 NOTE — Unmapped (Signed)
Based on recent sputum culture results of Burkholderia gladoli, prescribing Ciprofloxacin to take in addition to his inhaled antibiotics and chronic Bactrim.  Prescription sent to local [facility] in [place].  Notifying patient via MyChart message.

## 2019-09-05 ENCOUNTER — Encounter
Admit: 2019-09-05 | Discharge: 2019-09-06 | Payer: PRIVATE HEALTH INSURANCE | Attending: Internal Medicine | Primary: Internal Medicine

## 2019-09-05 NOTE — Unmapped (Addendum)
Let me know how you are feeling towards the end of the Cipro and if you want to extend for another week.    Call about your Cayston refill.     I do not think that wearing a face shield is sufficient protection against COVID-19 when you are out in public.    Interested in participating in COVID-19 vaccine trials? https://www.coronaviruspreventionnetwork.org    Symptoms of a bronchiectasis exacerbation: 48 hours of at least three of the following:  ?? Increased cough  ?? Change in volume or appearance of sputum  ?? Increased sputum purulence  ?? Worsening shortness of breath and/or exercise tolerance  ?? Fatigue and/or malaise  ?? Coughing up blood (hemoptysis)    If you are experiencing some of these symptoms:  ?? Increase the frequency and/or intensity of your airway clearance  ?? Try to submit a sputum sample for bacterial and AFB cultures (at [facility] or locally)  ?? Reach out to me or your local physician.  ?? If you need antibiotics, recommend treating for 14 days.      Please bring any new airway clearance equipment to your next visit to ensure that you are using and caring for it properly.    I recommend receiving the standard dose quadrivalent influenza vaccine before October 31.     Thank you for allowing me to be a part of your care. Please call the clinic with any questions.    Viona Gilmore, MD, MPH  Pulmonary and Critical Care Medicine  [address]  [id number]  [place], [place] [address]    Thank you for your visit to the [facility] Pulmonary Clinics. You may receive a survey from [facility] regarding your visit today, and we are eager to use this feedback to improve your experience. Thank you for taking the time to fill it out.    Between appointments, you can reach [place] at these numbers:    For appointments or the Pulmonary Nurse: [phone]  Fax: [phone]    For urgent issues after hours:  Hospital Operator: [phone], ask for Pulmonary Fellow on call For further information, check out the websites below:    [facility] for Bronchiectasis: ScienceMakers.[email or web address]    Impact Airway Clearance Education: http://www.impact-be.com    World Bronchiectasis Conference in [place] Patient session May 15, 2017: Videos can be watched here.     Bronchiectasis Toolbox (information about airway clearance): DiningCalendar.de    Copy for Individuals with Bronchiectasis and/or NTM through the Bronchiectasis Registry and the COPD Foundation: https://www.mckee-young.org/    Information about Non-tuberculous Mycobacterial Infections:  https://www.ntminfo.org     Interested in clinical trials and other research opportunities?  www.clinicaltrials.gov  The Rare Diseases Clinical Research Network [facility]) Genetic Disorders of  Mucociliary Clearance Consortium [facility]) Contact Registry is a way for patients with disorders of mucociliary clearance (such as bronchiectasis) and their family members to learn about research studies they may be able to join. Participation is completely voluntary and you may choose to withdraw at any time. There is no cost to join the [employer]. For more information or to join the registry please go to the following website:  [email or web address]

## 2019-09-05 NOTE — Unmapped (Signed)
[facility] Pulmonary Diseases and Critical Care Medicine  Video Telehealth Visit Note    09/05/19    8:59 AM    Assessment:      Patient:Brett [name] Ragin Sr. (1965/12/05)    [name] is a 53 y.o. male who is seen in a video telehealth visit for follow-up of bronchiectasis secondary to alpha-1 antitrypsin carrier state with prior NTM infection status post treatment and OSA on treatment for newly cultured Burkholderia gladioli.     Plan:      Problem List Items Addressed This Visit        Respiratory    Bronchiectasis (CMS-HCC) - Primary       Other    Burkholderia gladioli culture positive        [name] will complete the two weeks of Ciprofloxacin for treatment of B gladioli. He will reach out to me if he feels that he needs a 3rd week of it. He will call to confirm shipment of his Cayston.    No changes were made to his airway clearance regimen or inhaled therapies.    There are standing orders in the system for bacterial and AFB cultures should he have an exacerbation.     I recommended against just using a face shield as protection against COVID-19. If he is unable to wear a mask for more than a few minutes when out in public, I suggested that he not go out in public and have the exposure.    He will receive the seasonal influenza vaccine on Friday when he sees Dr. Rosita Fire in clinic.    Return in about 6 months (around 03/04/2020) for Recheck.  I will be out on maternity leave from December to March but Brett Hanna is aware that my colleagues will be covering if he needs anything.    The above plan was discussed with the patient and he is in agreement.       Subjective:      Patient's primary [place] pulmonologist: Brett Hanna  Patient's physical location during visit [facility]): [place], [place]  Patient's call back number: [phone]    Name/Relationship of other individuals with the patient in the room during video telehealth visit: None  I have confirmed that the patient requests and consents to having the above listed persons stay in the patient's room during the video telehealth visit.      HPI: [name] is a 53 y.o. male who is seen in a video telehealth visit for follow-up of bronchiectasis secondary to alpha-1 antitrypsin carrier state with prior NTM infection status post treatment and OSA.    I last saw him in clinic on 11/01/18. He reports having a terrible time with wheezing. Having more frequent coughing spells with more sputum volume. Better for few hours and then returns. If leans over to tie shoes, starts coughing and chokes on volume of mucus. Then can cough up blood during those spells. Describes hemoptysis as blood mixed with sputum (more than streaks but not frank blood). Can get up in morning and be okay some days. Other days gets coughing spells and then gets headache and can't do anything. Started Cipro yesterday; has taken two doses so far and thinks already helping this morning. Didn't feel like response to Augmentin was sustained.  Supposed to be starting Cayston today but is unsure if he has new supply (has to check with wife). Has another day worth of Colistin.  Tightness over RUQ comes and goes. If bends towards right, gets spasm. Supposed to  get flu shot on Friday.  Has questions about whether face shield is just as effective as wearing a mask to prevent COVID infection. Has trouble tolerating wearing a mask while seated - has back pain.    Past Medical History:   Diagnosis Date   ??? Allergic rhinitis    ??? Alpha-1-antitrypsin deficiency carrier     with slightly low levels   ??? Amblyopia    ??? Asthma    ??? Asthma, extrinsic    ??? Bronchiectasis (CMS-HCC) 06/12/2011   ??? Chronic sinusitis    ??? Colon polyps    ??? Diabetes mellitus (CMS-HCC)    ??? GERD (gastroesophageal reflux disease)    ??? H/O Mycobacterium avium complex infection    ??? Impaired glucose tolerance    ??? Obesity    ??? Pneumonia    ??? Pseudomonas respiratory infection    ??? 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 Sleep apnea    ??? Strabismus    ??? Strep throat        Past Surgical History:   Procedure Laterality Date   ??? BRONCHOSCOPY     ??? LUNG BIOPSY     ??? PR BRONCHOSCOPY,DIAGNOSTIC W BRUSH Bilateral 08/21/2013    Procedure: BRONCHOSCOPY, RIGID OR FLEXIBLE, INCLUDING FLOURO GUIDED; DIAGNOSTIC, WITH BRUSHING OR PROTECTED BRUSHINGS;  Surgeon: Hanna Dimmer, MD;  Location: BRONCH PROCEDURE LAB [facility];  Service: Pulmonary   ??? PR BRONCHOSCOPY,DIAGNOSTIC W LAVAGE Bilateral 08/21/2013    Procedure: BRONCHOSCOPY, RIGID OR FLEXIBLE, INCLUDE FLUOROSCOPIC GUIDANCE WHEN PERFORMED; W/BRONCHIAL ALVEOLAR LAVAGE;  Surgeon: Hanna Dimmer, MD;  Location: BRONCH PROCEDURE LAB [facility];  Service: Pulmonary   ??? PR COLONOSCOPY FLX DX W/COLLJ SPEC WHEN PFRMD N/A 03/21/2015    Procedure: COLONOSCOPY, FLEXIBLE, PROXIMAL TO SPLENIC FLEXURE; DIAGNOSTIC, W/WO COLLECTION SPECIMEN BY BRUSH OR WASH;  Surgeon: Brendia Sacks, MD;  Location: [facility]URES [facility];  Service: Gastroenterology   ??? SHOULDER SURGERY Right        Family History   Problem Relation Age of Onset   ??? Colon cancer Father    ??? Emphysema Maternal Uncle    ??? Heart disease Maternal Uncle    ??? Diabetes Paternal Grandfather        Social History     Tobacco Use   ??? Smoking status: Never Smoker   ??? Smokeless tobacco: Former Estate agent Use Topics   ??? Alcohol use: No     Alcohol/week: 0.0 standard drinks   ??? Drug use: No       Allergies  Reviewed at 8:31 AM      Reactions Comment    Codeine Shortness Of Breath, Rash     Rifampin  Fevers, chills.    Daliresp [roflumilast] Other (See Comments) Back pain          Current Outpatient Medications   Medication Sig Dispense Refill   ??? acetaminophen (TYLENOL EXTRA STRENGTH) 500 MG tablet Take 1 tablet by mouth as needed for pain     ??? albuterol 2.5 mg /3 mL (0.083 %) nebulizer solution INHALE 3 ML VIA NEBULIZER TWICE DAILY AND EVERY 6 HOURS AS NEEDED FOR SHORTNESS OF BREATH AND WHEEZING. 360 mL 14   ??? albuterol HFA 90 mcg/actuation inhaler Inhale 2 puffs every four (4) hours as needed for wheezing 18 g 11   ??? alcohol swabs PadM Use as directed with inhaled antibiotics. 100 each 5   ??? atorvastatin (LIPITOR) 20 MG tablet Take 1 tablet (20 mg) by mouth  daily 90 tablet 3   ??? aztreonam lysine (CAYSTON) 75 mg/mL Nebu nebulization solution Inhale 1 mL (75 mg total) by nebulization every eight (8) hours. Nebulize every other month after airway clearance using Altera nebulizer 84 mL 5   ??? blood-glucose meter kit Check sugars once daily. 1 each 0   ??? blood-glucose meter Misc USE TO CHECK BLOOD SUGAR ONCE DAILY 1 each 0   ??? cholecalciferol, vitamin D3, (VITAMIN D3) 2,000 unit cap Take 1 capsule (2,000 Units total) by mouth daily. 30 capsule prn   ??? ciprofloxacin HCl (CIPRO) 750 MG tablet Take 1 tablet (750 mg total) by mouth Two (2) times a day. 28 tablet 0   ??? colistimethate (COLYMYCIN) 150 mg injection Inject 2mL SWFI to mix Colistin Vial.Draw up 2mL (150mg ) mixed colistin & add 1mL SWFI for total volume=94mL. Inhale 3mL twice a day,every other month. 60 each 5   ??? fexofenadine (ALLEGRA) 180 MG tablet Take 1 tablet (180 mg total) by mouth daily. 90 tablet 3   ??? fluticasone propion-salmeteroL (ADVAIR HFA) 230-21 mcg/actuation inhaler INHALE 2 PUFFS BY MOUTH TWICE DAILY 36 g 3   ??? fluticasone propionate (FLONASE) 50 mcg/actuation nasal spray Use 2 sprays into each nostril daily 16 g 5   ??? lancets Misc 1 each by Other route once daily. Test daily before breakfast. Dispense 90 day supply. 100 each 3   ??? metFORMIN (GLUCOPHAGE-XR) 500 MG 24 hr tablet Take 4 tablets (2,000 mg total) by mouth daily with evening meal. 360 tablet 3   ??? MISCELLANEOUS MEDICAL SUPPLY MISC Frequency:PHARMDIR   Dosage:0.0     Instructions:  Note:CPAP 15 cm H20 with heated humidity for nasal dryness.  Mask: ResMed Quattro size Medium. Dx OSA. Dose: 1     ??? montelukast (SINGULAIR) 10 mg tablet Take 1 tablet by mouth nightly 90 each 3   ??? nebulizers Misc use with inhaled medication. 3 each 3   ??? nystatin (MYCOSTATIN) 100,000 unit/mL suspension Take 5 mL by mouth Four (4) times a day.      ??? omeprazole (PRILOSEC) 20 MG capsule Take 1 capsule (20 mg total) by mouth two (2) times a day. 180 capsule 3   ??? sertraline (ZOLOFT) 50 MG tablet TAKE 1 TABLET BY MOUTH ONCE DAILY 90 tablet 2   ??? sod bicarb-sod chlor-neti pot (AYR SALINE NASAL NETI RINSE) pkdv Use twice per day     ??? sodium chloride 7% 7 % Nebu Inhale 4 mL by nebulization Two (2) times a day. 720 mL 3   ??? sterile water, PF, Soln Use 2mL to mix colistin, then add 1mL sterile water for injection into neb cup along with colistin.Total volume 3mL 60 mL 5   ??? sulfamethoxazole-trimethoprim (BACTRIM) 400-80 mg per tablet Take 1 tablet by mouth twice daily. 180 tablet 0   ??? syringe with needle (BD LUER-LOK SYRINGE) 3 mL 20 gauge x 1 Syrg Use as directed with inhaled antibiotics. 1480 each 0   ??? topiramate (TOPAMAX) 100 MG tablet TAKE 1 TABLET (100 MG TOTAL) BY MOUTH NIGHTLY. 90 tablet 3     No current facility-administered medications for this visit.      Physical Exam (as applicable to extent possible): Obese white male appearing comfortable and in no acute distress. Easy work of breathing without accessory muscle use. No audible wheezing. Occasional cough. Speaking in full sentences but does get winded after talking continuously for a while.    Diagnostic Review:   The following data were reviewed  during this video telehealth visit with key findings summarized below:    Pulmonary Function Testing:  ?? FEV1 (% predicted) FVC (% predicted) FEV1/FVC   05/12/13 2.92 L (85%) 3.48 L (80%) 84%   06/26/14 2.29 L (67%) 2.84 L (65%) 81%   03/25/17 2.44 L (71%) 2.95 L (67%) 83%   06/29/17 2.34 L (70%) 2.68 L (62%) 87%   11/23/17 2.20 L (66%) 2.62 L (61%) 84%   03/08/18 2.06 L (62%) 2.59 L (60%) 80%   06/21/18 2.04 L (62%) 2.76 L (64%) 74%   11/01/18 2.08 L (63%) 2.63 L (61%) 79%   ??  Cultures  ?? Source Bacterial Culture AFB Smear AFB Culture   03/25/17 Sputum 3+ OPF; 3+ Stenotrophomonas negative negative 06/29/17 Sputum OPF negative Overgrown with bacteria/fungus   01/28/18 Sputum 2+ OPF; 2+ GNR glucose non-fermenter; Scedosporium negative 2 morphologies Streptomyces   02/01/18 Sputum 3+ OPF; 1+ GNR glucose non-fermenter; Scedosporium negative Streptomyces   03/02/18 Sputum 3+ OPF; 3+ GNR glucose non-fermenter; Scedosporium negative Streptomyces   08/07/19 Sputum 3+ OPF; 3+ Burkholderia gladioli negative NGTD   ??    Laboratory Data:  Lab Results   Component Value Date    WBC 7.5 03/01/2018    HGB 15.2 03/01/2018    HCT 46.6 03/01/2018    PLT 280 03/01/2018       Chemistry        Component Value Date/Time    NA 140 01/03/2019 1158    NA 143 01/04/2015 1032    K 4.4 01/03/2019 1158    K 4.5 01/04/2015 1032    CL 102 01/03/2019 1158    CL 106 01/04/2015 1032    CO2 27.0 01/03/2019 1158    CO2 25 01/04/2015 1032    BUN 17 01/03/2019 1158    BUN 17 01/04/2015 1032    CREATININE 0.97 01/03/2019 1158    CREATININE 0.99 01/04/2015 1032    GLU 93 01/03/2019 1158        Component Value Date/Time    CALCIUM 9.7 01/03/2019 1158    CALCIUM 9.0 01/04/2015 1032    ALKPHOS 123 09/06/2018 1421    ALKPHOS 78 01/04/2015 1032    AST 26 09/06/2018 1421    AST 25 01/04/2015 1032    ALT 32 09/06/2018 1421    ALT 42 01/04/2015 1032    BILITOT 0.7 09/06/2018 1421    BILITOT 0.6 01/04/2015 1032        Imaging  Chest CT (03/18/18): Images personally reviewed. Bronchial wall thickening and bronchiectasis primarily involving the airways supplying the lingula and lung bases with associated clustered micronodular and tree-in-bud opacities. With respect to 02/02/2018, there is increased opacification at the deep posterior costophrenic recesses with more volume loss particularly at the left base, favored to represent atelectasis.  ??  Chest CT (02/02/18): Images personally reviewed. Bronchiectasis predominantly in bases of lingula, right middle lobe, and bilateral lower lobes (left greater than right), mild to moderately progressed since 03/30/2017. New and increased nodular and tree-in-bud opacities, now involving all lobes. Patchy consolidations in the bases of the lingula, and lower lobes also stable to mildly increased. Also new left upper lobe nodular opacity with adjacent groundglass, 8 mm, (2:26). No definite reticular opacities, or honeycombing identified. No appreciable air trapping on expiratory imaging.  ??  Chest CT (03/30/17): Images personally reviewed. Interval worsening of bilateral lower lobe bronchiectasis and bronchial wall thickening with new lingular bronchiectasis. Increased clustered groundglass opacities with areas of consolidation in the  lower lobes and lingula. Findings may represent sequela of endobronchial infection or impaired mucus clearance.  Bronchiectasis has been present on CT imaging since 06/12/11.    I spent 31 minutes on the real-time audio and video with the patient. I spent an additional 5 minutes on pre- and post-visit activities.     The patient was not located and I was not located within 250 yards of the clinic or medical center during the video or telephone visit. The patient was physically located in [place] or a state in which I am permitted to provide care. The patient and/or parent/guardian understood that s/he may incur co-pays and cost sharing, and agreed to the telemedicine visit. The visit was reasonable and appropriate under the circumstances given the patient's presentation at the time.    The patient and/or parent/guardian has been advised of the potential risks and limitations of this mode of treatment (including, but not limited to, the absence of in-person examination) and has agreed to be treated using telemedicine. The patient's/patient's family's questions regarding telemedicine have been answered. If the visit was completed in an ambulatory setting, the patient and/or parent/guardian has also been advised to contact their provider's office for worsening conditions, and seek emergency medical treatment and/or call 911 if the patient deems either necessary      Portions of this record have been created using Dragon dictation software. Dictation errors have been sought, but may not have been identified and corrected.    Viona Gilmore, MD  09/05/19  8:59 AM    Billing Guidance: [phone] (New Patients), [phone] (Consults), [phone] (Established Patients)

## 2019-09-06 NOTE — Unmapped (Signed)
Transitions Child psychotherapist at the [facility] & Pulmonary Specialty Clinic   (outpatient):     SWer received an Data processing manager message from [facility] Lauris Poag, MD, inquiring about the status of patient's [facility] application.     SWer looked at patient's chart and saw a note from the Financial Department stating patient has been approved for [employer] Default Financial Assistance Program (09/05/2019 - 03/04/2021).    SWer informed patient and encouraged him to look for a letter in the mail from the [employer]. SWer encouraged patient to stay in touch for any follow up questions or future needs and provided SWer contact information. SWer will be available for any additional psychosocial issues as needed.      SWer notified Truett Mainland, MD.    Rockwell Alexandria, MSW, LCSW  Transitions Social Worker  [facility]  Office: [phone]  [email or web address]

## 2019-09-08 ENCOUNTER — Encounter: Admit: 2019-09-08 | Discharge: 2019-09-08 | Payer: PRIVATE HEALTH INSURANCE

## 2019-09-08 DIAGNOSIS — J471 Bronchiectasis with (acute) exacerbation: Principal | ICD-10-CM

## 2019-09-08 DIAGNOSIS — E8801 Alpha-1-antitrypsin deficiency: Principal | ICD-10-CM

## 2019-09-08 DIAGNOSIS — Z1159 Encounter for screening for other viral diseases: Principal | ICD-10-CM

## 2019-09-08 DIAGNOSIS — E119 Type 2 diabetes mellitus without complications: Principal | ICD-10-CM

## 2019-09-08 LAB — COMPREHENSIVE METABOLIC PANEL
ALBUMIN: 4.8 g/dL (ref 3.5–5.0)
ALKALINE PHOSPHATASE: 108 U/L (ref 38–126)
ALT (SGPT): 27 U/L (ref <50)
ANION GAP: 12 mmol/L (ref 7–15)
AST (SGOT): 23 U/L (ref 19–55)
BILIRUBIN TOTAL: 0.7 mg/dL (ref 0.0–1.2)
BLOOD UREA NITROGEN: 16 mg/dL (ref 7–21)
BUN / CREAT RATIO: 20
CALCIUM: 9.7 mg/dL (ref 8.5–10.2)
CHLORIDE: 102 mmol/L (ref 98–107)
CREATININE: 0.82 mg/dL (ref 0.70–1.30)
EGFR CKD-EPI AA MALE: >90 mL/min/{1.73_m2} (ref >=60)
EGFR CKD-EPI NON-AA MALE: >90 mL/min/{1.73_m2} (ref >=60)
POTASSIUM: 4.3 mmol/L (ref 3.5–5.0)
PROTEIN TOTAL: 7.8 g/dL (ref 6.5–8.3)
SODIUM: 138 mmol/L (ref 135–145)

## 2019-09-08 LAB — HEPATITIS C ANTIBODY: Hepatitis C virus Ab:PrThr:Pt:Ser:Ord:: NONREACTIVE

## 2019-09-08 LAB — ALBUMIN / CREATININE URINE RATIO: ALBUMIN QUANT URINE: <0.6 mg/dL

## 2019-09-08 LAB — CBC W/ AUTO DIFF
BASOPHILS ABSOLUTE COUNT: 0.1 10*9/L (ref 0.0–0.1)
BASOPHILS RELATIVE PERCENT: 0.9 %
EOSINOPHILS ABSOLUTE COUNT: 0.2 10*9/L (ref 0.0–0.4)
EOSINOPHILS RELATIVE PERCENT: 1.8 %
HEMATOCRIT: 49.8 % (ref 41.0–53.0)
HEMOGLOBIN: 16.1 g/dL (ref 13.5–17.5)
LARGE UNSTAINED CELLS: 1 % (ref 0–4)
LYMPHOCYTES ABSOLUTE COUNT: 0.9 10*9/L — ABNORMAL LOW (ref 1.5–5.0)
LYMPHOCYTES RELATIVE PERCENT: 9.5 %
MEAN CORPUSCULAR HEMOGLOBIN CONC: 32.3 g/dL (ref 31.0–37.0)
MEAN CORPUSCULAR HEMOGLOBIN: 28.2 pg (ref 26.0–34.0)
MEAN CORPUSCULAR VOLUME: 87.4 fL (ref 80.0–100.0)
MEAN PLATELET VOLUME: 8.5 fL (ref 7.0–10.0)
MONOCYTES ABSOLUTE COUNT: 0.7 10*9/L (ref 0.2–0.8)
NEUTROPHILS ABSOLUTE COUNT: 7.6 10*9/L — ABNORMAL HIGH (ref 2.0–7.5)
PLATELET COUNT: 311 10*9/L (ref 150–440)
RED CELL DISTRIBUTION WIDTH: 14.6 % (ref 12.0–15.0)
WBC ADJUSTED: 9.5 10*9/L (ref 4.5–11.0)

## 2019-09-08 LAB — ALBUMIN/CREATININE RATIO: Albumin/Creatinine:MRto:Pt:Urine:Qn:: 0

## 2019-09-08 LAB — NEUTROPHILS ABSOLUTE COUNT: Neutrophils:NCnc:Pt:Bld:Qn:Automated count: 7.6 — ABNORMAL HIGH

## 2019-09-08 LAB — CO2: Carbon dioxide:SCnc:Pt:Ser/Plas:Qn:: 24

## 2019-09-08 MED ORDER — SERTRALINE 50 MG TABLET
ORAL_TABLET | Freq: Every day | ORAL | 2 refills | 90 days | Status: CP
Start: 2019-09-08 — End: 2020-09-07

## 2019-09-08 NOTE — Unmapped (Addendum)
Blood tests  Urine testing today    [facility] about the metformin batch    Eye exam today

## 2019-09-08 NOTE — Unmapped (Signed)
PHQ9  Thoughts that you would be better off dead, or of hurting yourself in some way: Not at all  PHQ-9 TOTAL SCORE: 9                  GAD7  GAD-7 Total Score: 0         Influenza vaccine was given in the right deltoid patient tolerated it well information sheet was given.

## 2019-09-08 NOTE — Unmapped (Signed)
Assessment and Plan    1. Bronchiectasis with acute exacerbation (CMS-HCC)    2. Type 2 diabetes mellitus without complication, without long-term current use of insulin (CMS-HCC)    3. Alpha-1-antitrypsin deficiency (CMS-HCC)    4. Need for hepatitis C screening test        #1. Bronchiectasis with exacerbation, alpha 1 antitrypsin heterozygote. Following closely with pulm. Doing a little better with the antibiotics. He continues his airway clearance activities. Reports no hemoptysis in the last week. Cont present management.     #2. DM2. Check A1C today. Check urine microalbumin and retinal camera. Currently on max dose metformin. If A1C elevated, discuss second agent. Most likely SGLT2. He had questions about whether his metformin has been recalled. He will check w/his pharmacy.     #3. Anxiety. Doing ok. No SI. Some mood worsening in light of the recent bronchiectasis exacerbation. Reassess next visit to see if need to increase sertraline.    #4. HM. Flu shot today. Screening hep C, screening HIV.    Orders Placed This Encounter   Procedures   ??? INFLUENZA VACCINE (QUAD) IM - 6 MO-ADULT - PF   ??? Hemoglobin A1c   ??? Hepatitis C Antibody   ??? Albumin/creatinine urine ratio   ??? Comprehensive Metabolic Panel   ??? CBC w/ Differential   ??? Telehealth Fundus Photography Screening - OU - Both Eyes         Follow up: Return in about 3 months (around 12/09/2019), or if symptoms worsen or fail to improve.        Reason for visit: Follow-up  , bronchiectasis, diabetes    Brett Hanna Sr. is a 53 y.o. male who presents for follow up.    HPI  On abx for bronchiectasis exacerbation. Thinks it's helping. Lots of sputum. Positions he's in impact whether he could cough it up. It was rusty colored. Wasn't having fevers.   No hemoptysis since early last week. Was aching in joints. Using the vest. Sputum and wheezing a little better since yesterday. The stress feelings were really bad last week. Worse when he has coughing fits. Always worse when he's wheezing. If he uses his cpap or takes a nap it gets better.    Denies depression. Feels agrevation. Feels like he's learned to deal with it. No SI.    Checks bs at home. 131-160s.    Medications were reviewed    Physical Exam  Gen: obese, somewhat cushinoid, NAD  Vitals:    09/08/19 1038 09/08/19 1046 09/08/19 1047   BP: 123/72 134/74 127/75   Pulse: 67 69 70   Resp: 18     Temp: 36.2 ??C (97.2 ??F)     TempSrc: Tympanic     SpO2: 95%     Weight: 84.3 kg (185 lb 12.8 oz)     Height: 170.2 cm (5' 7)       CV: RRR, no mrg  Pulm: course bs bilat  Ext: no edema  Skin: warm and dry, no rashes  Psych: appears somewhat upset    PHQ-9 Score:  PHQ-9 TOTAL SCORE: 9  GAD-7 Score:  GAD-7 Total Score: 0    Wt Readings from Last 6 Encounters:   09/08/19 84.3 kg (185 lb 12.8 oz)   09/05/19 88 kg (194 lb)   01/03/19 83.9 kg (185 lb)   01/03/19 84.1 kg (185 lb 4.8 oz)   11/01/18 86.6 kg (191 lb)   09/06/18 85.4 kg (188 lb 4.8 oz)  Last A1C 7.4  Reviewed pulm note  Reviewed respiratory cultures    Medication adherence and barriers to the treatment plan have been addressed. Opportunities to optimize healthy behaviors have been discussed. Patient / caregiver voiced understanding.

## 2019-09-09 LAB — HEMOGLOBIN A1C: Hemoglobin A1c/Hemoglobin.total:MFr:Pt:Bld:Qn:: 8.2 — ABNORMAL HIGH

## 2019-09-10 LAB — HIV ANTIGEN/ANTIBODY COMBO: HIV 1+2 Ab+HIV1 p24 Ag:PrThr:Pt:Ser/Plas:Ord:IA: NONREACTIVE

## 2019-09-11 MED ORDER — EMPAGLIFLOZIN 10 MG TABLET
ORAL_TABLET | Freq: Every day | ORAL | 1 refills | 30.00000 days | Status: CP
Start: 2019-09-11 — End: ?
  Filled 2019-09-12: qty 30, 30d supply, fill #0

## 2019-09-12 MED FILL — JARDIANCE 10 MG TABLET: 30 days supply | Qty: 30 | Fill #0 | Status: AC

## 2019-09-12 NOTE — Unmapped (Signed)
Called to discuss labs results.  Overall labs stable except A1C above goal at 8.2  Recommend adding empagliflozen 10mg  daily  Follow up DM enhanced care in 4 weeks to see if we can go up on the dose.    Next A1C in 3 months.

## 2019-09-12 NOTE — Unmapped (Signed)
-----   Message from [place] sent at 09/12/2019 10:02 AM EST -----  Regarding: Please schedule follow up  Follow up DM enhanced care in 4 weeks to see if we can go up on the dose.      ----- Message -----  From: Norvel Richards, MD  Sent: 09/11/2019   4:11 PM EST  To: [facility] Acc Front Desk Staff    Pls schedule follow up

## 2019-09-19 DIAGNOSIS — J470 Bronchiectasis with acute lower respiratory infection: Principal | ICD-10-CM

## 2019-09-19 MED ORDER — BD LUER-LOK SYRINGE 3 ML 20 GAUGE X 1"
5 refills | 0 days | Status: CP
Start: 2019-09-19 — End: ?
  Filled 2019-09-25: qty 60, 30d supply, fill #0

## 2019-09-19 MED ORDER — COLISTIN (COLISTIMETHATE SODIUM) 150 MG SOLUTION FOR INJECTION
5 refills | 0 days | Status: CP
Start: 2019-09-19 — End: ?
  Filled 2019-09-25: qty 60, 30d supply, fill #0

## 2019-09-19 MED ORDER — NEBULIZERS
3 refills | 0 days | Status: CP
Start: 2019-09-19 — End: ?

## 2019-09-19 MED ORDER — WATER FOR INJECTION, STERILE INJECTION SOLUTION
5 refills | 0 days | Status: CP
Start: 2019-09-19 — End: ?
  Filled 2019-09-25: qty 600, 30d supply, fill #0

## 2019-09-19 NOTE — Unmapped (Signed)
[facility] Clinical Assessment & Refill Coordination Note    Brett Simpler Sr., DOB: 06/29/1966  Phone: [phone] (home)     All above HIPAA information was verified with patient.     Specialty Medication(s):   CF/Pulmonary: -Nebulized colistin (150mg  vials) and supply kit     Current Outpatient Medications   Medication Sig Dispense Refill   ??? acetaminophen (TYLENOL EXTRA STRENGTH) 500 MG tablet Take 1 tablet by mouth as needed for pain     ??? albuterol 2.5 mg /3 mL (0.083 %) nebulizer solution INHALE 3 ML VIA NEBULIZER TWICE DAILY AND EVERY 6 HOURS AS NEEDED FOR SHORTNESS OF BREATH AND WHEEZING. 360 mL 14   ??? albuterol HFA 90 mcg/actuation inhaler Inhale 2 puffs every four (4) hours as needed for wheezing 18 g 11   ??? alcohol swabs PadM Use as directed with inhaled antibiotics. 100 each 5   ??? atorvastatin (LIPITOR) 20 MG tablet Take 1 tablet (20 mg) by mouth daily 90 tablet 3   ??? aztreonam lysine (CAYSTON) 75 mg/mL Nebu nebulization solution Inhale 1 mL (75 mg total) by nebulization every eight (8) hours. Nebulize every other month after airway clearance using Altera nebulizer 84 mL 5   ??? blood-glucose meter kit Check sugars once daily. 1 each 0   ??? blood-glucose meter Misc USE TO CHECK BLOOD SUGAR ONCE DAILY 1 each 0   ??? cholecalciferol, vitamin D3, (VITAMIN D3) 2,000 unit cap Take 1 capsule (2,000 Units total) by mouth daily. 30 capsule prn   ??? ciprofloxacin HCl (CIPRO) 750 MG tablet Take 1 tablet (750 mg total) by mouth Two (2) times a day. 28 tablet 0   ??? colistimethate (COLYMYCIN) 150 mg injection Inject 2mL SWFI to mix Colistin Vial.Draw up 2mL (150mg ) mixed colistin & add 1mL SWFI for total volume=30mL. Inhale 3mL twice a day,every other month. 60 each 5   ??? empagliflozin (JARDIANCE) 10 mg tablet Take 1 tablet (10 mg total) by mouth daily. 30 tablet 1   ??? fexofenadine (ALLEGRA) 180 MG tablet Take 1 tablet (180 mg total) by mouth daily. 90 tablet 3 ??? fluticasone propion-salmeteroL (ADVAIR HFA) 230-21 mcg/actuation inhaler INHALE 2 PUFFS BY MOUTH TWICE DAILY 36 g 3   ??? fluticasone propionate (FLONASE) 50 mcg/actuation nasal spray Use 2 sprays into each nostril daily 16 g 5   ??? lancets Misc 1 each by Other route once daily. Test daily before breakfast. Dispense 90 day supply. 100 each 3   ??? metFORMIN (GLUCOPHAGE-XR) 500 MG 24 hr tablet Take 4 tablets (2,000 mg total) by mouth daily with evening meal. 360 tablet 3   ??? MISCELLANEOUS MEDICAL SUPPLY MISC Frequency:PHARMDIR   Dosage:0.0     Instructions:  Note:CPAP 15 cm H20 with heated humidity for nasal dryness.  Mask: ResMed Quattro size Medium. Dx OSA. Dose: 1     ??? montelukast (SINGULAIR) 10 mg tablet Take 1 tablet by mouth nightly 90 each 3   ??? nebulizers Misc use with inhaled medication. 3 each 3   ??? nystatin (MYCOSTATIN) 100,000 unit/mL suspension Take 5 mL by mouth Four (4) times a day.      ??? omeprazole (PRILOSEC) 20 MG capsule Take 1 capsule (20 mg total) by mouth two (2) times a day. 180 capsule 3   ??? sertraline (ZOLOFT) 50 MG tablet Take 1 tablet by mouth once daily 90 tablet 2   ??? sod bicarb-sod chlor-neti pot (AYR SALINE NASAL NETI RINSE) pkdv Use twice per  day     ??? sodium chloride 7% 7 % Nebu Inhale 4 mL by nebulization Two (2) times a day. 720 mL 3   ??? sterile water, PF, Soln Use 2mL to mix colistin, then add 1mL sterile water for injection into neb cup along with colistin.Total volume 3mL 60 mL 5   ??? sulfamethoxazole-trimethoprim (BACTRIM) 400-80 mg per tablet Take 1 tablet by mouth twice daily. 180 tablet 0   ??? syringe with needle (BD LUER-LOK SYRINGE) 3 mL 20 gauge x 1 Syrg Use as directed with inhaled antibiotics. 1480 each 0   ??? topiramate (TOPAMAX) 100 MG tablet TAKE 1 TABLET (100 MG TOTAL) BY MOUTH NIGHTLY. 90 tablet 3     No current facility-administered medications for this visit.         Changes to medications: Brett Hanna reports no changes at this time.    Allergies   Allergen Reactions ??? Codeine Shortness Of Breath and Rash   ??? Rifampin      Fevers, chills.   ??? Daliresp [Roflumilast] Other (See Comments)     Back pain       Changes to allergies: No    SPECIALTY MEDICATION ADHERENCE     Colistin 150 mg: 0 days of medicine on hand , starting Dec 1       Medication Adherence    Patient reported X missed doses in the last month: 0  Specialty Medication: colistin 150mg    Patient is on additional specialty medications: No  Informant: patient  Support network for adherence: family member          Specialty medication(s) dose(s) confirmed: Regimen is correct and unchanged.     Are there any concerns with adherence? No    Adherence counseling provided? Not needed    CLINICAL MANAGEMENT AND INTERVENTION      Clinical Benefit Assessment:    Do you feel the medicine is effective or helping your condition? Yes    Clinical Benefit counseling provided? Not needed    Adverse Effects Assessment:    Are you experiencing any side effects? No    Are you experiencing difficulty administering your medicine? No    Quality of Life Assessment:    How many days over the past month did your Bronchiechtasis  keep you from your normal activities? For example, brushing your teeth or getting up in the morning. a few days and started Cipro  15 days ago. Finished course and feeling alittle better but not all better    Have you discussed this with your provider? Yes    Therapy Appropriateness:    Is therapy appropriate? Yes, therapy is appropriate and should be continued    DISEASE/MEDICATION-SPECIFIC INFORMATION      N/A    PATIENT SPECIFIC NEEDS     ? Does the patient have any physical, cognitive, or cultural barriers? No    ? Is the patient high risk? No     ? Does the patient require a Care Management Plan? No     ? Does the patient require physician intervention or other additional services (i.e. nutrition, smoking cessation, social work)? No      SHIPPING     Specialty Medication(s) to be Shipped: CF/Pulmonary: -Nebulized colistin (150mg  vials) and supply kit    Other medication(s) to be shipped: syringes, neb cup, sterile water    Denied alcohol swab.  Other 7 meds were scheduled previously by TM (tangela)     Changes to insurance: No    Delivery  Scheduled: Yes, Expected medication delivery date: 11/24.     Medication will be delivered via UPS to the confirmed prescription address in [facility].    The patient will receive a drug information handout for each medication shipped and additional FDA Medication Guides as required.  Verified that patient has previously received a Conservation officer, historic buildings.    All of the patient's questions and concerns have been addressed.    Julianne Rice   [facility] Specialty Pharmacist

## 2019-09-19 NOTE — Unmapped (Signed)
Bronchiectasis Clinic Pharmacist Note     Medication orders were sent for: [name].    1. Bronchiectasis with acute lower respiratory infection (CMS-HCC)    - sterile water, PF, Soln; Use 2mL to mix colistin, then add 1mL sterile water for injection into neb cup along with colistin.Total volume 3mL  Dispense: 60 ampule; Refill: 5  - colistimethate (COLYMYCIN) 150 mg injection; Inject 2mL SWFI to mix Colistin Vial.Draw up 2mL (150mg ) mixed colistin & add 1mL SWFI for total volume=65mL. Inhale 3mL twice a day,every other month.  Dispense: 60 each; Refill: 5  - nebulizers Misc; use with inhaled medication.  Dispense: 3 each; Refill: 3      Pharmacy sent to: [facility]      Anell Barr, PharmD, BCPS, CPP  Clinical Pharmacist Practitioner  [facility]

## 2019-09-21 MED ORDER — ALBUTEROL SULFATE 2.5 MG/3 ML (0.083 %) SOLUTION FOR NEBULIZATION
14 refills | 0 days | Status: CP
Start: 2019-09-21 — End: 2020-09-20
  Filled 2019-09-25: qty 360, 30d supply, fill #0

## 2019-09-22 MED ORDER — CETIRIZINE 10 MG TABLET
ORAL_TABLET | Freq: Every day | ORAL | 3 refills | 90.00000 days | Status: CP
Start: 2019-09-22 — End: ?
  Filled 2019-09-25: qty 90, 90d supply, fill #0

## 2019-09-22 NOTE — Unmapped (Signed)
Reviewed refill request and appropriate for refill.     .Pharmacy Refill Request    albuterol 2.5 mg /3 mL (0.083 %) nebulizer solution    Last OV: 09/05/2019    Last written Rx: 11/23/2017    Last dispensed: 04/04/2019      Upcoming appointment: None seen at this time

## 2019-09-25 DIAGNOSIS — J479 Bronchiectasis, uncomplicated: Principal | ICD-10-CM

## 2019-09-25 MED FILL — COLISTIN (COLISTIMETHATE SODIUM) 150 MG SOLUTION FOR INJECTION: 30 days supply | Qty: 60 | Fill #0 | Status: AC

## 2019-09-25 MED FILL — FLUTICASONE PROPIONATE 50 MCG/ACTUATION NASAL SPRAY,SUSPENSION: 30 days supply | Qty: 16 | Fill #3 | Status: AC

## 2019-09-25 MED FILL — MONTELUKAST 10 MG TABLET: 90 days supply | Qty: 90 | Fill #2 | Status: AC

## 2019-09-25 MED FILL — SODIUM CHLORIDE 7 % FOR NEBULIZATION: RESPIRATORY_TRACT | 90 days supply | Qty: 720 | Fill #3

## 2019-09-25 MED FILL — MONTELUKAST 10 MG TABLET: ORAL | 90 days supply | Qty: 90 | Fill #2

## 2019-09-25 MED FILL — LC PLUS MISC: 30 days supply | Qty: 3 | Fill #0 | Status: AC

## 2019-09-25 MED FILL — FLUTICASONE PROPIONATE 50 MCG/ACTUATION NASAL SPRAY,SUSPENSION: NASAL | 30 days supply | Qty: 16 | Fill #3

## 2019-09-25 MED FILL — BD LUER-LOK SYRINGE 3 ML 20 GAUGE X 1": 30 days supply | Qty: 60 | Fill #0 | Status: AC

## 2019-09-25 MED FILL — ALBUTEROL SULFATE 2.5 MG/3 ML (0.083 %) SOLUTION FOR NEBULIZATION: 30 days supply | Qty: 360 | Fill #0 | Status: AC

## 2019-09-25 MED FILL — OMEPRAZOLE 20 MG CAPSULE,DELAYED RELEASE: 90 days supply | Qty: 180 | Fill #1 | Status: AC

## 2019-09-25 MED FILL — SODIUM CHLORIDE 7 % FOR NEBULIZATION: 90 days supply | Qty: 720 | Fill #3 | Status: AC

## 2019-09-25 MED FILL — LC PLUS MISC: 30 days supply | Qty: 3 | Fill #0

## 2019-09-25 MED FILL — ATORVASTATIN 20 MG TABLET: 90 days supply | Qty: 90 | Fill #2

## 2019-09-25 MED FILL — ATORVASTATIN 20 MG TABLET: 90 days supply | Qty: 90 | Fill #2 | Status: AC

## 2019-09-25 MED FILL — CETIRIZINE 10 MG TABLET: 90 days supply | Qty: 90 | Fill #0 | Status: AC

## 2019-09-25 MED FILL — WATER FOR INJECTION, STERILE INJECTION SOLUTION: 30 days supply | Qty: 600 | Fill #0 | Status: AC

## 2019-09-25 MED FILL — OMEPRAZOLE 20 MG CAPSULE,DELAYED RELEASE: ORAL | 90 days supply | Qty: 180 | Fill #1

## 2019-10-11 ENCOUNTER — Ambulatory Visit: Admit: 2019-10-11 | Discharge: 2019-10-11 | Payer: PRIVATE HEALTH INSURANCE

## 2019-10-11 DIAGNOSIS — E8801 Alpha-1-antitrypsin deficiency: Principal | ICD-10-CM

## 2019-10-11 DIAGNOSIS — E119 Type 2 diabetes mellitus without complications: Principal | ICD-10-CM

## 2019-10-11 LAB — POTASSIUM: Potassium:SCnc:Pt:Ser/Plas:Qn:: 4.3

## 2019-10-11 LAB — EGFR CKD-EPI NON-AA MALE: Lab: >90

## 2019-10-11 LAB — SODIUM: Sodium:SCnc:Pt:Ser/Plas:Qn:: 137

## 2019-10-11 LAB — CREATININE
CREATININE: 0.78 mg/dL (ref 0.70–1.30)
EGFR CKD-EPI NON-AA MALE: >90 mL/min/{1.73_m2} (ref >=60)

## 2019-10-11 MED ORDER — EMPAGLIFLOZIN 25 MG TABLET
ORAL_TABLET | Freq: Every day | ORAL | 3 refills | 90 days | Status: CP
Start: 2019-10-11 — End: ?
  Filled 2019-10-12: qty 90, 90d supply, fill #0

## 2019-10-11 NOTE — Unmapped (Addendum)
Assessment/Plan:     Lab Results   Component Value Date    A1C 8.2 (H) 09/08/2019    A1C 7.4 (H) 01/03/2019    A1C 7.8 (H) 07/01/2018       1. DM2 - uncontrolled per recent a1c, increase Jardiance to 25mg  daily.  Obtain chem3 today.  Continue mfm XR 1g daily.  Continue statin.  ACR unremarkable in 11/20, likely not on ACEi/ARB 2/2 normotensive bp.  Keep PCP f/u in 2/21  2. Bronchiectasis/alph 1 antitrypsin def - no acute change, follows closely w/ pulm  3. Time - >107min spent w/ >50% in face-to-face education/counselling and treatment planning regarding above issues.    **chem3 unremarkable    F/u: PCP in 2/21    Subject/Objective:     Chief Complaint   Patient presents with   ??? Follow-up     DM     S: 53 y.o. year old male with PMHx significant for   Past Medical History:   Diagnosis Date   ??? Allergic rhinitis    ??? Alpha-1-antitrypsin deficiency carrier     with slightly low levels   ??? Amblyopia    ??? Asthma    ??? Asthma, extrinsic    ??? Bronchiectasis (CMS-HCC) 06/12/2011   ??? Chronic sinusitis    ??? Colon polyps    ??? Diabetes mellitus (CMS-HCC)    ??? GERD (gastroesophageal reflux disease)    ??? H/O Mycobacterium avium complex infection    ??? Impaired glucose tolerance    ??? Obesity    ??? Pneumonia    ??? Pseudomonas respiratory infection    ??? 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 Sleep apnea    ??? Strabismus    ??? Strep throat    .     Patient Active Problem List   Diagnosis   ??? Allergic rhinitis   ??? Alpha-1-antitrypsin deficiency carrier   ??? Benign neoplasm of colon   ??? Bronchiectasis (CMS-HCC)   ??? Hyperlipidemia   ??? Obstructive sleep apnea syndrome   ??? Chronic sinusitis   ??? Vitamin D deficiency   ??? Type 2 diabetes mellitus without complication, without long-term current use of insulin (CMS-HCC)   ??? Uncomplicated severe persistent asthma   ??? Obesity, Class I, BMI 30-34.9   ??? Migraine with aura and without status migrainosus, not intractable   ??? Gastroesophageal reflux disease without esophagitis   ??? Flexural eczema ??? Mycobacterium avium-intracellulare infection (CMS-HCC) - colonization   ??? Anxiety   ??? Fatty liver   ??? Cough   ??? Burkholderia gladioli culture positive       Presents for DM f/u in setting of starting Jardiance 10mg  daily in 11/20, tolerating.  Reports most FBG in 125-145 range before and after starting Jardiance.  Checks most FBG.  No lows.         Your Medication List          Accurate as of October 11, 2019  9:53 AM. If you have any questions, ask your nurse or doctor.            STOP taking these medications    ciprofloxacin HCl 750 MG tablet  Commonly known as: CIPRO  Stopped by: Dwana Melena, PA        CHANGE how you take these medications    empagliflozin 25 mg tablet  Commonly known as: JARDIANCE  Take 1 tablet (25 mg total) by mouth daily.  What changed:   ?? medication strength  ?? how much  to take  Changed by: Dwana Melena, PA        CONTINUE taking these medications    ADVAIR HFA 230-21 mcg/actuation inhaler  Generic drug: fluticasone propion-salmeteroL  INHALE 2 PUFFS BY MOUTH TWICE DAILY     alcohol swabs Padm  Use as directed with inhaled antibiotics.     ALLERGY RELIEF (FEXOFENADINE) 180 MG tablet  Generic drug: fexofenadine  Take 1 tablet (180 mg total) by mouth daily.     atorvastatin 20 MG tablet  Commonly known as: LIPITOR  Take 1 tablet (20 mg) by mouth daily     AYR SALINE NASAL NETI RINSE Pkdv  Generic drug: sod bicarb-sod chlor-neti pot  Use twice per day     BD LUER-LOK SYRINGE 3 mL 20 gauge x 1 Syrg  Generic drug: syringe with needle  Use as directed with inhaled antibiotics.     blood-glucose meter kit  Check sugars once daily.     ON CALL EXPRESS METER Misc  Generic drug: blood-glucose meter  USE TO CHECK BLOOD SUGAR ONCE DAILY     CAYSTON 75 mg/mL Nebu nebulization solution  Generic drug: aztreonam lysine  Inhale 1 mL (75 mg total) by nebulization every eight (8) hours. Nebulize every other month after airway clearance using Altera nebulizer     cetirizine 10 MG tablet Commonly known as: ZyrTEC  Take 1 tablet (10 mg total) by mouth daily.     cholecalciferol (vitamin D3) 50 mcg (2,000 unit) Cap  Commonly known as: VITAMIN D3  Take 1 capsule (2,000 Units total) by mouth daily.     colistimethate 150 mg injection  Commonly known as: COLYMYCIN  Inject 2mL of sterile water for injection to mix Colistin Vial.Draw up 2mL (150mg ) mixed colistin & add 1mL SWFI for total volume=76mL. Inhale 3mL twice a day,every other month.     fluticasone propionate 50 mcg/actuation nasal spray  Commonly known as: FLONASE  Use 2 sprays into each nostril daily     lancets Misc  1 each by Other route once daily. Test daily before breakfast. Dispense 90 day supply.     LC PLUS Misc  Generic drug: nebulizers  Use with inhaled medication.     metFORMIN 500 MG 24 hr tablet  Commonly known as: GLUCOPHAGE-XR  Take 4 tablets (2,000 mg total) by mouth daily with evening meal.     MISCELLANEOUS MEDICAL SUPPLY MISC  Frequency:PHARMDIR   Dosage:0.0     Instructions:  Note:CPAP 15 cm H20 with heated humidity for nasal dryness.  Mask: ResMed Quattro size Medium. Dx OSA. Dose: 1     montelukast 10 mg tablet  Commonly known as: SINGULAIR  Take 1 tablet by mouth nightly     nystatin 100,000 unit/mL suspension  Commonly known as: MYCOSTATIN  Take 5 mL by mouth Four (4) times a day.     omeprazole 20 MG capsule  Commonly known as: PriLOSEC  Take 1 capsule (20 mg total) by mouth two (2) times a day.     sertraline 50 MG tablet  Commonly known as: ZOLOFT  Take 1 tablet by mouth once daily     sodium chloride 7% 7 % Nebu  Inhale 4 mL by nebulization Two (2) times a day.     sterile water (PF) Soln  Use 2mL to mix colistin, then add 1mL sterile water for injection into neb cup along with colistin.Total volume 3mL     sulfamethoxazole-trimethoprim 400-80 mg per tablet  Commonly known as: BACTRIM  Take 1 tablet by mouth twice daily.     topiramate 100 MG tablet  Commonly known as: TOPAMAX TAKE 1 TABLET (100 MG TOTAL) BY MOUTH NIGHTLY.     TYLENOL EXTRA STRENGTH 500 MG tablet  Generic drug: acetaminophen  Take 1 tablet by mouth as needed for pain     VENTOLIN HFA 90 mcg/actuation inhaler  Generic drug: albuterol  Inhale 2 puffs every four (4) hours as needed for wheezing     albuterol 2.5 mg /3 mL (0.083 %) nebulizer solution  Inhale the contents of 1 vial (3 mL) via nebulizer twice daily and every 6 hours as needed for shortness of breath and wheezing            Allergies   Allergen Reactions   ??? Codeine Shortness Of Breath and Rash   ??? Rifampin      Fevers, chills.   ??? Daliresp [Roflumilast] Other (See Comments)     Back pain        Medication adherence and barriers to the treatment plan have been addressed. Opportunities to optimize healthy behaviors have been discussed. Patient / caregiver voiced understanding.      ROS: negative for vision changes, CP, abdominal pain, bowel/bladder changes, bleeding, lower extremity edema    O:  Vital Signs:    Vitals:    10/11/19 0902   BP: 128/75   Pulse: 64   Temp: 37.1 ??C (98.7 ??F)   TempSrc: Temporal   SpO2: 95%   Weight: 84.6 kg (186 lb 8 oz)   Height: 170.2 cm (5' 7)

## 2019-10-11 NOTE — Unmapped (Signed)
.  Omron BPs  BP#1   128/75   BP#2   122/75  BP#3   133/76    Average BP   128/75    10/11/2019 BS: 150

## 2019-10-12 MED FILL — JARDIANCE 25 MG TABLET: 90 days supply | Qty: 90 | Fill #0 | Status: AC

## 2019-10-12 NOTE — Unmapped (Signed)
Pt's wife left message on clinic nurse line.    She claims that the Pt is coughing up blood.    She wants to know what to do.    Call back: [phone]    Thank you,     Kathrynn Speed, CMA

## 2019-10-17 ENCOUNTER — Encounter: Admit: 2019-10-17 | Discharge: 2019-10-18 | Payer: PRIVATE HEALTH INSURANCE

## 2019-10-17 DIAGNOSIS — J479 Bronchiectasis, uncomplicated: Principal | ICD-10-CM

## 2019-10-17 NOTE — Unmapped (Signed)
Called back to Brett Hanna. He was able to produce a sputum sample today. He is on the way to clinic.     He is keeping it cold and orders are in Epic.

## 2019-10-19 NOTE — Unmapped (Signed)
Pt's wife phoned clinic nurse line.    For a couple of days he's been filling fever, chills, back pain.    I think the wife said he had a sputum test yesterday and wants to know if it came back to see if it's infection or something or possible it could be Covid?    Call them back: [phone]    Thank you,     Kathrynn Speed, CMA

## 2019-10-20 NOTE — Unmapped (Signed)
Brett Hanna left voicemail on clinic. Returned his call.     He reports that he did feel he had a fever last night (no thermometer), but that shaking/chills were not as bad as they have been.     He doesn't think he has had a fever today.     Back pain: he describes as lighter than it was in his power back.     He still does have a headache. He reports that his wife placed icy hot on top of his head and seemed to help a bit.    Overall he just feels blah, but mentioned that he does not feel like he is sick enough for the hospital.

## 2019-10-20 NOTE — Unmapped (Signed)
The patient has been phoning the clinic back-and-forth.  I called him directly today.    His symptoms are a bit confusing, and I do not know that they are pulmonary, he does not have any increased cough or shortness of breath or chest pain but what he has is episodic fevers with a headache and low back pain.  The headache seems to improve when he treats the fever with 1 Tylenol he cannot take 2 because of liver disease.  He has alpha-1 deficiency liver and lung disease.  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  He and his wife are not convinced its long either.  He does not think he is sick enough to need to go to the hospital.  However I think we should have a low threshold.    We compromised that I would call him back tomorrow and see how he is doing how he got through the night, with the fever and headache are still persistent.  And the back pain.    Shriya Aker Amado Coe MD [facility] FRCPI  Professor of Medicine  [facility]  Phone [phone]

## 2019-10-21 NOTE — Unmapped (Signed)
Still not great.  Headache, feeling rough.  Cough better.    24 more hours as long as doesn't get worse or ED at [facility].    Prob needs COVID test too.    Leroy Trim Amado Coe MD [facility] FRCPI  Professor of Medicine  [facility]  Phone [phone]

## 2019-10-23 ENCOUNTER — Ambulatory Visit: Admit: 2019-10-23 | Discharge: 2019-10-24 | Attending: Family | Primary: Family

## 2019-10-23 DIAGNOSIS — J479 Bronchiectasis, uncomplicated: Principal | ICD-10-CM

## 2019-10-23 NOTE — Unmapped (Signed)
Assessment     Brett Fogo Zulueta Sr. is a 53 y.o. male presenting to [facility] for COVID testing.     Problem List Items Addressed This Visit     None      Visit Diagnoses     Contact with and (suspected) exposure to other viral communicable diseases    -  Primary    Contact with or exposure to viral disease              Plan     If no testing performed, pt counseled on routine care for respiratory illness.  If testing performed, COVID sent.  Patient directed to Home given findings during today's visit.    Subjective     Brett Fogo Burkett Sr. is a 53 y.o. male who [facility] with complaints of the following:    Exposure History: In the last 21 days?     Have you traveled outside of [place]? No               Have you been in close contact with someone confirmed by a test to have COVID? (Close contact is within 6 feet for at least 10 minutes) No       Have you worked in a health care facility? No     Lived or worked facility like a nursing home, group home, or assisted living?    No         Are you scheduled to have surgery or a procedure in the next 3 days? No               Are you scheduled to receive cancer chemotherapy within the next 7 days?    No     Have you ever been tested before for COVID-19 with a swab of your nose? No   Are you a healthcare worker being tested so to return to work No         Right now,  do you have any of the following that developed over the past 7 days (as stated by patient on intake form):    Subjective fever (felt feverish) No   Chills (especially repeated shaking chills) No   Severe fatigue (felt very tired) No   Muscle aches No   Runny nose No   Sore throat No   Loss of taste or smell No   Cough (new onset or worsening of chronic cough) No   Shortness of breath Yes, how many days? 1   Nausea or vomiting No   Headache No   Abdominal Pain No   Diarrhea (3 or more loose stools in last 24 hours) No History/Medical Conditions (as stated by patient on intake form):    Do you have any of the following:   Asthma or emphysema or COPD Yes   Cystic Fibrosis No   Diabetes Yes   High Blood Pressure  No   Cardiovascular Disease No   Chronic Kidney Disease No   Chronic Liver Disease No   Chronic blood disorder like Sickle Cell Disease  No   Weak immune system due to disease or medication Yes   Neurologic condition that limits movement  No   Developmental delay - Moderate to Severe  No   Recent (within past 2 weeks) or current Pregnancy No   Morbid Obesity (>100 pounds over ideal weight) No   Current Smoker No   Former Smoker No  Objective     Given above, testing performed: Yes    Testing Performed:  Test Specimen Type Sent to   COVID-19  NP Swab  Lab       Scribe's Attestation: Paulita Fujita, FNP obtained and performed the history, physical exam and medical decision making elements that were entered into the chart.  Signed by Eddie Dibbles, CMA serving as Scribe, on 10/23/2019 2:05 PM      The documentation recorded by the scribe accurately reflects the service I personally performed and the decisions made by me. Aida Puffer, FNP  October 24, 2019 10:08 AM

## 2019-10-23 NOTE — Unmapped (Signed)
Hello,  Spoke to [name] today to check-in on symptoms he has had for past week.     Today he states that he is feeling better.     He does have an appointment at the [facility]  For COVID-19 testing.     Will keep in touch with him. He has my contact number as well.

## 2019-10-23 NOTE — Unmapped (Signed)
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 We do recommend that you and everyone in your house stay at home as much as possible.  Keep six (6) feet of distance between you and other people. Wash your hands frequently and avoid touching your face.  The CDC now recommends wearing cloth face coverings or masks in public settings where other physical distancing measures are difficult to maintain, such as grocery stores and pharmacies. Call your PCP or the COVID Help Line [phone]) from 7a-7p with new or worsening symptoms.      The call was resolved in the following manner: COVID testing scheduled

## 2019-10-24 DIAGNOSIS — U071 COVID-19: Principal | ICD-10-CM

## 2019-10-24 DIAGNOSIS — J988 Other specified respiratory disorders: Principal | ICD-10-CM

## 2019-10-24 DIAGNOSIS — R05 Cough: Principal | ICD-10-CM

## 2019-10-24 NOTE — Unmapped (Signed)
Due to your recent positive COVID-19 result, [facility] has the capability to offer [facility] therapy.  The U.[place] FDA has issued an Emergency Use Authorization to permit the emergency use of the unapproved product BAM for the treatment of mild to moderate coronavirus disease 2019 (COVID19).      This treatment may help to prevent progression on to severe symptoms and/or hospitalization in those who are confirmed to be high-risk COVID (+) and are within 10 days of symptom onset. Could you tell me when your symptoms started 12/15 chills, worsened symptoms on 12/16, continues to have cough.      Patient is 53 y.o. years old.   Patient is 54 years or older AND has a diagnosis of ONE of the following which makes them eligible for infusion therapy: Diabetes, OR bronchiectasis     Advised patient that those receiving monoclonal or plasma products should wait 90 days until getting immunized.    Answered all questions utilizing  FAQ sheet and patient has accepted treatment therapy.  Inbasket message sent to [facility] and encounter routed.  Chart reviewed, patient has has not been assessed and scheduled for [facility] visit on pending.     Agreed to therapy, scheduling notified       Current Outpatient Medications   Medication Sig Dispense Refill   ??? acetaminophen (TYLENOL EXTRA STRENGTH) 500 MG tablet Take 1 tablet by mouth as needed for pain     ??? albuterol 2.5 mg /3 mL (0.083 %) nebulizer solution Inhale the contents of 1 vial (3 mL) via nebulizer twice daily and every 6 hours as needed for shortness of breath and wheezing 360 mL 14   ??? albuterol HFA 90 mcg/actuation inhaler Inhale 2 puffs every four (4) hours as needed for wheezing 18 g 11   ??? alcohol swabs PadM Use as directed with inhaled antibiotics. 100 each 5   ??? atorvastatin (LIPITOR) 20 MG tablet Take 1 tablet (20 mg) by mouth daily 90 tablet 3 ??? aztreonam lysine (CAYSTON) 75 mg/mL Nebu nebulization solution Inhale 1 mL (75 mg total) by nebulization every eight (8) hours. Nebulize every other month after airway clearance using Altera nebulizer 84 mL 5   ??? blood-glucose meter kit Check sugars once daily. 1 each 0   ??? blood-glucose meter Misc USE TO CHECK BLOOD SUGAR ONCE DAILY 1 each 0   ??? cetirizine (ZYRTEC) 10 MG tablet Take 1 tablet (10 mg total) by mouth daily. 90 tablet 3   ??? cholecalciferol, vitamin D3, (VITAMIN D3) 2,000 unit cap Take 1 capsule (2,000 Units total) by mouth daily. 30 capsule prn   ??? colistimethate (COLYMYCIN) 150 mg injection Inject 2mL of sterile water for injection to mix Colistin Vial.Draw up 2mL (150mg ) mixed colistin & add 1mL SWFI for total volume=60mL. Inhale 3mL twice a day,every other month. 60 each 5   ??? empagliflozin (JARDIANCE) 25 mg tablet Take 1 tablet (25 mg total) by mouth daily. 90 tablet 3   ??? fexofenadine (ALLEGRA) 180 MG tablet Take 1 tablet (180 mg total) by mouth daily. 90 tablet 3   ??? fluticasone propion-salmeteroL (ADVAIR HFA) 230-21 mcg/actuation inhaler INHALE 2 PUFFS BY MOUTH TWICE DAILY 36 g 3   ??? fluticasone propionate (FLONASE) 50 mcg/actuation nasal spray Use 2 sprays into each nostril daily 16 g 5   ??? lancets Misc 1 each by Other route once daily. Test daily before breakfast. Dispense 90 day supply. 100 each 3   ??? metFORMIN (GLUCOPHAGE-XR) 500 MG 24 hr tablet Take  4 tablets (2,000 mg total) by mouth daily with evening meal. 360 tablet 3   ??? MISCELLANEOUS MEDICAL SUPPLY MISC Frequency:PHARMDIR   Dosage:0.0     Instructions:  Note:CPAP 15 cm H20 with heated humidity for nasal dryness.  Mask: ResMed Quattro size Medium. Dx OSA. Dose: 1     ??? montelukast (SINGULAIR) 10 mg tablet Take 1 tablet by mouth nightly 90 each 3   ??? nebulizers Misc Use with inhaled medication. 3 each 3   ??? nystatin (MYCOSTATIN) 100,000 unit/mL suspension Take 5 mL by mouth Four (4) times a day. ??? omeprazole (PRILOSEC) 20 MG capsule Take 1 capsule (20 mg total) by mouth two (2) times a day. 180 capsule 3   ??? sertraline (ZOLOFT) 50 MG tablet Take 1 tablet by mouth once daily 90 tablet 2   ??? sod bicarb-sod chlor-neti pot (AYR SALINE NASAL NETI RINSE) pkdv Use twice per day     ??? sodium chloride 7% 7 % Nebu Inhale 4 mL by nebulization Two (2) times a day. 720 mL 3   ??? sterile water, PF, Soln Use 2mL to mix colistin, then add 1mL sterile water for injection into neb cup along with colistin.Total volume 3mL 600 mL 5   ??? sulfamethoxazole-trimethoprim (BACTRIM) 400-80 mg per tablet Take 1 tablet by mouth twice daily. 180 tablet 0   ??? syringe with needle (BD LUER-LOK SYRINGE) 3 mL 20 gauge x 1 Syrg Use as directed with inhaled antibiotics. 60 each 5   ??? topiramate (TOPAMAX) 100 MG tablet TAKE 1 TABLET (100 MG TOTAL) BY MOUTH NIGHTLY. 90 tablet 3     No current facility-administered medications for this visit.

## 2019-10-24 NOTE — Unmapped (Signed)
Scheduled COVID [facility] appt. BAM eligible: Hx of bronchiectasis and DM. Symptoms started on 12/15

## 2019-10-24 NOTE — Unmapped (Signed)
Called to follow up with him: COVID-19 positive  Got sick last week-Dec 15. Had fever, aches, headaches. Has had minimal cough. Not very productive. The last 2-3 days a bit more DOE. Can occur with walking to the bathroom. Today does feel somewhat better. Doesn't have a pulse ox at home.  BS 105    Conversant. Speaking full sentences. No respiratory distress.  Advised:    -Monitor your breathing  -Continue good po intake  -Agree with BAM (schedulers calling back this afternoon)  -If acutely SOB or otherwise worsening, go to ER. He expressed agreement and understanding.

## 2019-10-25 ENCOUNTER — Encounter: Admit: 2019-10-25 | Discharge: 2019-10-26 | Payer: PRIVATE HEALTH INSURANCE

## 2019-10-25 ENCOUNTER — Encounter: Admit: 2019-10-25 | Discharge: 2019-10-25 | Payer: PRIVATE HEALTH INSURANCE

## 2019-10-25 MED ADMIN — diphenhydrAMINE (BENADRYL) capsule/tablet 25 mg: 25 mg | ORAL | @ 20:00:00 | Stop: 2019-10-25

## 2019-10-25 MED ADMIN — famotidine (PF) (PEPCID) injection 20 mg: 20 mg | INTRAVENOUS | @ 20:00:00 | Stop: 2019-10-25

## 2019-10-25 MED ADMIN — bamlanivimab 700 mg in sodium chloride (NS) 0.9 % 270 mL IVPB: 700 mg | INTRAVENOUS | @ 19:00:00 | Stop: 2019-10-25

## 2019-10-25 NOTE — Unmapped (Signed)
Patient Education        10 Things to Do When You Have COVID-19    Stay home.  Don't go to school, work, or public areas. And don't use public transportation, ride-shares, or taxis unless you have no choice. Leave your home only if you need to get medical care. But call the doctor's office first so they know you're coming. And wear a cloth face cover.     Ask before leaving isolation.  Talk with your doctor or other health professional about when it will be safe for you to leave isolation.     Wear a cloth face cover when you are around other people.  It can help stop the spread of the virus when you cough or sneeze.     Limit contact with people in your home.  If possible, stay in a separate bedroom and use a separate bathroom.     Avoid contact with pets and other animals.  If possible, have a friend or family member care for them while you're sick.     Cover your mouth and nose with a tissue when you cough or sneeze.  Then throw the tissue in the trash right away.     Wash your hands often, especially after you cough or sneeze.  Use soap and water, and scrub for at least 20 seconds. If soap and water aren't available, use an alcohol-based hand sanitizer.     Don't share personal household items.  These include bedding, towels, cups and glasses, and eating utensils.     Clean and disinfect your home every day.  Use household cleaners or disinfectant wipes or sprays. Take special care to clean things that you grab with your hands. These include doorknobs, remote controls, phones, and handles on your refrigerator and microwave. And don't forget countertops, tabletops, bathrooms, and computer keyboards.     Take acetaminophen (Tylenol) to relieve fever and body aches.  Read and follow all instructions on the label.   Current as of: July 18, 2019??????????????????????????????Content Version: 12.7  ?? 2006-2020 [employer]. Care instructions adapted under license by [facility] [facility]. If you have questions about a medical condition or this instruction, always ask your healthcare professional. [employer] Inc[employer] disclaims any warranty or liability for your use of this information.

## 2019-10-25 NOTE — Unmapped (Signed)
I called back to clarify. Sounds like he is a candidate but that schedule is full for tomorrow. They don't have slots until Monday which would be too far out. I sent a message to COVID coordinators to see if he can be added on for tomorrow.

## 2019-10-25 NOTE — Unmapped (Signed)
TeleHealth Phone Encounter  This medical encounter was conducted virtually using [email or web address]  TeleHealth protocols.      I have identified myself to the patient and conveyed my credentials to Brett Hanna  In case we get disconnected, patient's phone number is [phone] (home)    Is there someone else in the room? Yes. What is your relationship? wife . Do you want this person here for the visit? yes.      Assessment/Plan:      No diagnosis found.    [facility] Testing Criteria (v. 04/14/2019)  Note: Testing decisions remain up to individual providers and their patients within the parameters of these criteria.    ? Persons with symptoms of a possible infection with COVID-19, including: fever, cough, shortness of breath, chills, muscle pain, new loss of taste or smell, vomiting or diarrhea, and/or sore throat    ? Persons without symptoms who are considered to be at high risk of severe illness due to underlying health conditions  []  53 years old or older  []  Pregnant or postpartum within 2 weeks of delivery  []  Morbidly obese: BMI ? 40 or 100 pounds over ideal body weight       Any of the following chronic conditions:  []  Diabetes mellitus  []  Immunosuppression, including caused by medications or by HIV infection  []  Pulmonary disease, including asthma  []  Cardiovascular disease  []  Hypertensive disease  []  Renal disease  []  Hepatic disease  []  Hematologic disease, including sickle cell disease  []  Neurological condition that limits movement  []  Moderate to severe developmental delay      ? Persons without symptoms who are considered to be at high risk of severe illness who are from historically marginalized populations disproportionately affected by adverse COVID-19 outcomes    ? Persons without symptoms who have had close contact with someone who is confirmed positive within the last 21 days    Dispostion Based upon the clinical evaluation we feel it is appropriate to manage current symptoms supportively at home. Call [facility] if there are any worsening symptoms   To ER if SOB occurs.  BAM infusion clearance given.  Sent message to COVID therapy coordinators and infusion orders placed.  Asked for pt appt to be moved up to keep in in the 10 day window.      Follow up  No follow-ups on file.         Subjective:     Brett Simpler Sr. is a 53 y.o. male who presents  for a telephone visit with respiratory symptoms amidst COVID-19 outbreak.     HPI  Pt with symptoms onset 10/17/2019.  Tested + 10/24/2019 per wife.  Pt with cough only.  No fever or sob.  Pt with PMH of DM2 and bronchiectasis.  Has [facility]AM appt for 10/30/2019 at this time but trying to move it up to keep him in the 10 day window.  No other issues or concerns at this time.  Using albuterol more frequently than usual but not more than prescribed.      ROS  As per HPI.    I have reviewed the problem list, medications, and allergies and have updated/reconciled them if needed.          Objective:     As part of this Telephone Visit, no in-person exam was conducted.         I spent 7 minutes on the phone with the patient. I spent an additional 8 minutes on  pre- and post-visit activities.     The patient was physically located in [place] or a state in which I am permitted to provide care. The patient and/or parent/guardian understood that s/he may incur co-pays and cost sharing, and agreed to the telemedicine visit. The visit was reasonable and appropriate under the circumstances given the patient's presentation at the time.    The patient and/or parent/guardian has been advised of the potential risks and limitations of this mode of treatment (including, but not limited to, the absence of in-person examination) and has agreed to be treated using telemedicine. The patient's/patient's family's questions regarding telemedicine have been answered. If the visit was completed in an ambulatory setting, the patient and/or parent/guardian has also been advised to contact their provider???s office for worsening conditions, and seek emergency medical treatment and/or call 911 if the patient deems either necessary.

## 2019-10-25 NOTE — Unmapped (Signed)
1350 Pt arrived for Bamlanivimab for COVID.     1417 Bamlanivimab 700 mg IV infusing over 1 hr via PIV.    1428 Patient c/o severe back pain, infusion stopped, VS obtained and stable.   1431 Symptoms resolved. Patient without complaints.  1440 Benadryl and Pepcid administered. Patient without complaints at this time.   1455 Bamlanivimab infusion resumed at previous rate.    1535 Bamlanivimab infusion completed.     1630 1 hr monitoring completed.Flushed with NS. PIV removed. Reviewed AVS with pt.   Pt  was provided with the ???Fact Sheet for Patients, Parents and Caregivers???, has been informed of alternatives to receiving authorized bamlanivimab, and has been Informed that Bamlanivimab is an unapproved drug that is authorized for use under this Emergency Use Authorization  Reverbalized understanding.  Dc'd from Infusion Center.

## 2019-10-25 NOTE — Unmapped (Signed)
Discharge Instructions for Bamlanivimab     After your infusion therapy, you can contact your primary care provider or our Clinical Contact Center [phone] for minor symptoms.     For severe symptoms, please dial 911.     A [facility] nurse will be calling you to check in tomorrow as well.       Those receiving monoclonal or plasma products should wait 90 days until getting immunized.     Thank you for visiting the [facility]. Should you have any questions or concerns about your infusion, please do not hesitate to contact [facility] at [phone] or the nurses' station at [phone].      Fact Sheet for Patients, Parents and Caregivers    Emergency Use Authorization (EUA) of Bamlanivimab for Coronavirus Disease 2019 (COVID-19)    You are being given a medicine called Bamlanivimab for the treatment of coronavirus disease 2019 (COVID19). This Fact Sheet contains information to help you understand the potential risks and potential benefits of taking bamlanivimab, which you may receive.    Receiving Bamlanivimab may benefit certain people with COVID-19.    Read this Fact Sheet for information about Bamlanivimab. Talk to your healthcare provider if you have  questions. It is your choice to receive bamlanivimab or stop it at any time.    What is COVID-19?    COVID-19 is caused by a virus called a coronavirus. People can get COVID-19 through contact with  another person who has the virus.  COVID-19 illnesses have ranged from very mild (including some with no reported symptoms) to severe,  including illness resulting in death. While information so far suggests that most COVID-19 illness is mild,  serious illness can happen and may cause some of your other medical conditions to become worse. People  of all ages with severe, long-lasting (chronic) medical conditions like heart disease, lung disease, and  diabetes, for example, seem to be at higher risk of being hospitalized for COVID-19.    What are the symptoms of COVID-19?    The symptoms of COVID-19 include fever, cough, and shortness of breath, which may appear 2 to 14 days after  exposure. Serious illness including breathing problems can occur and may cause your other medical conditions to  become worse.    What is Bamlanivimab?    Bamlanivimab is an investigational medicine used for the treatment of COVID-19 in non-hospitalized adults and  adolescents 12 years of age and older with mild to moderate symptoms who weigh 88 pounds (40 kg) or more,  and who are at high risk for developing severe COVID-19 symptoms or the need for hospitalization. Bamlanivimab  is investigational because it is still being studied. There is limited information known about the safety or  effectiveness of using bamlanivimab to treat people with COVID-19.    The FDA has authorized the emergency use of bamlanivimab for the treatment of COVID-19 under an Emergency  Use Authorization (EUA). For more information on EUA, see the section ???What is an Emergency Use  Authorization (EUA)???? at the end of this Fact Sheet.    What should I tell my healthcare provider before I receive Bamlanivimab?    Tell your healthcare provider about all of your medical conditions, including if you:  ??? Have any allergies  ??? Are pregnant or plan to become pregnant  ??? Are breastfeeding or plan to breastfeed  ??? Have any serious illnesses  ??? Are taking any medications (prescription, over-the-counter, vitamins, and herbal products)  How will   I receive bamlanivimab?  ??? Bamlanivimab is given to you through a vein (intravenous or IV) for at least 1 hour.  ??? You will receive one dose of bamlanivimab by IV infusion.    What are the important possible side effects of Bamlanivimab?    Possible side effects of bamlanivimab are:    ??? Allergic reactions. Allergic reactions can happen during and after infusion with bamlanivimab. Tell your  healthcare provider right away if you get any of the following signs and symptoms of allergic reactions: fever,  chills, nausea, headache, shortness of breath, low blood pressure, wheezing, swelling of your lips, face, or  throat, rash including hives, itching, muscle aches, and dizziness.    The side effects of getting any medicine by vein may include brief pain, bleeding, bruising of the skin, soreness,  swelling, and possible infection at the infusion site.    These are not all the possible side effects of Bamlanivimab. Not a lot of people have been given bamlanivimab.  Serious and unexpected side effects may happen. Bamlanivimab is still being studied so it is possible that all of  the risks are not known at this time.    It is possible that bamlanivimab could interfere with your body's own ability to fight off a future infection of  SARS-CoV-2. Similarly, Bamlanivimab may reduce your body???s immune response to a vaccine for SARS-CoV-2.  Specific studies have not been conducted to address these possible risks. Talk to your healthcare provider if you  have any questions.    What other treatment choices are there?    Like Bamlanivimab, FDA may allow for the emergency use of other medicines to treat people with COVID-19. Go  to https://www.covid19treatmentguidelines.nih.gov/ for information on the emergency use of other medicines that  are not approved by FDA to treat people with COVID-19. Your healthcare provider may talk with you about clinical  trials you may be eligible for.    It is your choice to be treated or not to be treated with bamlanivimab. Should you decide not to receive  bamlanivimab or stop it at any time, it will not change your standard medical care.    What if I am pregnant or breastfeeding?  There is limited experience treating pregnant women or breastfeeding mothers with bamlanivimab. For a mother  and unborn baby, the benefit of receiving bamlanivimab may be greater than the risk from the treatment. If you  are pregnant or breastfeeding, discuss your options and specific situation with your healthcare provider.    How do I report side effects with bamlanivimab?  Tell your healthcare provider right away if you have any side effect that bothers you or does not go away.  Report side effects to FDA MedWatch at www.fda.gov/medwatch, call 1-800-FDA-1088, or contact [employer] at 1-855-LillyC19 (1-855-545-5921).        CDC Guidelines For When to Be Around Others or Return to Work    When You Can be Around Others  Updated Dec. 1, 2020  Facebook Twitter LinkedIn Syndicate    If you have or think you might have COVID-19, it is important to stay home and away from other people. Staying away from others helps stop the spread of COVID-19. If you have an emergency warning sign (including trouble breathing), get emergency medical care immediately.    I think or know I had COVID-19, and I had symptoms  You can be around others after:    10 days since symptoms first appeared and    24 hours with no fever without the use of fever-reducing medications and  Other symptoms of COVID-19 are improving*  *Loss of taste and smell may persist for weeks or months after recovery and need not delay the end of isolation    Most people do not require testing to decide when they can be around others; however, if your healthcare provider recommends testing, they will let you know when you can resume being around others based on your test results.    Note that these recommendations do not apply to persons with severe COVID-19 or with severely weakened immune systems (immunocompromised). These persons should follow the guidance below for ???I was severely ill with COVID-19 or have a severely weakened immune system (immunocompromised) due to a health condition or medication. When can I be around others????    I tested positive for COVID-19 but had no symptoms    If you continue to have no symptoms, you can be with others after 10 days have passed since you had a positive viral test for COVID-19. Most people do not require testing to decide when they can be around others; however, if your healthcare provider recommends testing, they will let you know when you can resume being around others based on your test results.    If you develop symptoms after testing positive, follow the guidance above for ???I think or know I had COVID-19, and I had symptoms.???    I was severely ill with COVID-19 or have a severely weakened immune system (immunocompromised) due to a health condition or medication. When can I be around others?    People who are severely ill with COVID-19 might need to stay home longer than 10 days and up to 20 days after symptoms first appeared. Persons who are severely immunocompromised may require testing to determine when they can be around others. Talk to your healthcare provider for more information. If testing is available in your community, it may be recommended by your healthcare provider. Your healthcare provider will let you know if you can resume being around other people based on the results of your testing.    Your doctor may work with an infectious disease expert or your local health department to determine whether testing will be necessary before you can be around others.    For Anyone Who Has Been Around a Person with COVID-19    Anyone who has had close contact with someone with COVID-19 should stay home for 14 days after their last exposure to that person.    The best way to protect yourself and others is to stay home for 14 days if you think you???ve been exposed to someone who has COVID-19. Check your local health department???s website for information about options in your area to possibly shorten this quarantine period.  However, anyone who has had close contact with someone with COVID-19 and who meets the following criteria does NOT need to stay home.    Has COVID-19 illness within the previous 3 months and  Has recovered and  Remains without COVID-19 symptoms (for example, cough, shortness of breath)  Confirmed and suspected cases of reinfection of the virus that causes COVID-19  Cases of reinfection of COVID-19 have been reported but are rare. In general, reinfection means a person was infected (got sick) once, recovered, and then later became infected again. Based on what we know from similar viruses, some reinfections are expected.

## 2019-10-25 NOTE — Unmapped (Signed)
COVID OUTREACH   ? Chart reviewed for COVID outreach.  ? Patient was tested on 10/23/19 due to: exposure.  ? Patient notified of positive COVID result by [facility] on 10/24/19.  ? Patient added to COVID outreach call list.  Lab Results   Component Value Date    SARSCOVID Detected (A) 10/23/2019       SYMPTOMS INVENTORY   ? Epic documentation reflects patient noted the following symptoms started 10/21/20: SOB (NOS).    DISPOSITION   ? Outreach due in 2 business days (10/26/19).

## 2019-10-25 NOTE — Unmapped (Signed)
Patient advised that [name] and Sande Brothers will not be able to get infusion because tested Positive for COVID19 and is to last to have done .  Would like you to give them a call to discuss if there are options for some kind of meds best #[phone]

## 2019-10-26 NOTE — Unmapped (Signed)
I called to check in with COVID-19 and multiple significant comorbidities.  Feels about the same as yesterday. Breathing isn't worse. Got BAM today-some rxn during it. Treated with benadryl. Knows to take benadryl tonight if any other reaction. O2 sat 94% at infusion center. Advised to have a friend or family get a pulse ox for him to monitor at home. If 90% or lower, go to ER. He expressed agreement and understanding.He will call if worsening.

## 2019-10-26 NOTE — Unmapped (Signed)
10/26/19  Brett Simpler Sr.  10-02-1966    COVID OUTREACH:     Outreach to patient for positive result outreach .   Lab Results   Component Value Date    SARSCOVID Detected (A) 10/23/2019        OUTREACH SYMPTOMS & NEEDS:     Called to check-in: patient currently notes the following symptoms: myalgias.  Pt stated    Overall would you say you are better, same or worse? better    Social situation:   Do you have someone at home who is helping to take care of you? Yes.  Do you have any concerns about not being able to get enough food? No.  Refer to 211 or social work if needed.    DISPOSITION:     Patient: advised next check-in will occur in 2 business days.   Work note provided: no, patient declined at this time.  Patient advised the following based on result and current symptoms: because you have a positive COVID test and continue to have symptoms, we recommend you remain at home and continue to self-isolate until you have not had a fever for twenty four (24) hours, your symptoms have improved significantly and at least ten (10) days have passed since symptoms began. Guidance on returning to work should be coordinated with your employer. We also recommend that members of your household remain in the home if they continue to have close contact with you while you are contagious and self-isolating.  Household members will also need to remain in the home for 14 days after their last close contact with you while you are self-isolating.  Household members should also self-monitor for any fever and symptoms of COVID-19 such as cough, shortness of breath, sore throat or loss of smell or taste. If household members develop symptoms, they should contact their primary care provider..  If there are any changes or worsening of symptoms patient advised to contact [facility] at [phone]. Patient also advised to call 911 for a medical emergency such as severe shortness of breath. Ray Church Jcion Buddenhagen, LPN

## 2019-10-31 NOTE — Unmapped (Signed)
10/31/19  [name]  [birth year]    COVID OUTREACH:     Outreach to patient for positive result outreach .   Lab Results   Component Value Date    SARSCOVID Detected (A) 10/23/2019        OUTREACH SYMPTOMS & NEEDS:     Called to check-in: patient currently notes the following symptoms: no new symptoms. .    Overall would you say you are better, same or worse? better    Social situation:   Do you have someone at home who is helping to take care of you? Yes.  Do you have any concerns about not being able to get enough food? No.  Refer to 211 or social work if needed.    DISPOSITION:     Patient: advised next check-in will occur in 2 business days.   Work note provided: no, patient declined at this time.  Patient advised the following based on result and current symptoms: because you have a positive COVID test but are asymptomatic, please continue to monitor for symptoms.  Common symptoms of COVID-19 include fever or chills, cough or shortness of breath, loss of sense of smell or taste, diarrhea or nausea and headaches. We recommend, even if you are not showing any symptoms, you self-isolate and remain in your home.  If you do not develop any symptoms, you can discontinue isolation after at least ten (10) days have passed since you tested positive for COVID-19. Please notify us if you develop symptoms.  We also recommend that members of your household remain in the home if they continue to have close contact with you while you are contagious and self-isolating.  Household members will also need to remain in the home for 14 days after their last close contact with you while you are self-isolating.  Household members should also self-monitor for any fever and symptoms of COVID-19 such as cough, shortness of breath, sore throat or loss of smell or taste. If household members develop symptoms, they should contact their primary care provider.. If there are any changes or worsening of symptoms patient advised to contact their PCP. Patient also advised to call 911 for a medical emergency such as severe shortness of breath. and [facility] at [phone]. Patient also advised to call 911 for a medical emergency such as severe shortness of breath. Pollie Meyer, LPN

## 2019-11-02 DIAGNOSIS — J470 Bronchiectasis with acute lower respiratory infection: Principal | ICD-10-CM

## 2019-11-02 MED ORDER — METFORMIN ER 500 MG TABLET,EXTENDED RELEASE 24 HR
ORAL_TABLET | Freq: Every day | ORAL | 3 refills | 90 days | Status: CP
Start: 2019-11-02 — End: 2020-11-01
  Filled 2019-11-06: qty 360, 90d supply, fill #0

## 2019-11-02 MED ORDER — SULFAMETHOXAZOLE 400 MG-TRIMETHOPRIM 80 MG TABLET: 1 | tablet | Freq: Two times a day (BID) | 0 refills | 30 days | Status: AC

## 2019-11-02 MED ORDER — SULFAMETHOXAZOLE 400 MG-TRIMETHOPRIM 80 MG TABLET
ORAL_TABLET | Freq: Two times a day (BID) | ORAL | 0 refills | 30.00000 days | Status: CP
Start: 2019-11-02 — End: 2019-12-02
  Filled 2019-11-06: qty 60, 30d supply, fill #0

## 2019-11-02 NOTE — Unmapped (Signed)
11/02/19  Brett Simpler Sr.  1965-12-05    COVID OUTREACH:     Outreach to patient for positive result outreach .   Lab Results   Component Value Date    SARSCOVID Detected (A) 10/23/2019        OUTREACH SYMPTOMS & NEEDS:     Called to check-in: patient currently notes the following symptoms: cough.  Pt still has a bad cough  Overall would you say you are better, same or worse? better    Social situation:   Do you have someone at home who is helping to take care of you? Yes.  Do you have any concerns about not being able to get enough food? No.  Refer to 211 or social work if needed.    DISPOSITION:     Patient: advised next check-in will occur in 2 business days.   Work note provided: no, patient declined at this time.  Patient advised the following based on result and current symptoms: because you have a positive COVID test and continue to have symptoms, we recommend you remain at home and continue to self-isolate until you have not had a fever for twenty four (24) hours, your symptoms have improved significantly and at least ten (10) days have passed since symptoms began. Guidance on returning to work should be coordinated with your employer. We also recommend that members of your household remain in the home if they continue to have close contact with you while you are contagious and self-isolating.  Household members will also need to remain in the home for 14 days after their last close contact with you while you are self-isolating.  Household members should also self-monitor for any fever and symptoms of COVID-19 such as cough, shortness of breath, sore throat or loss of smell or taste. If household members develop symptoms, they should contact their primary care provider..  If there are any changes or worsening of symptoms patient advised to contact [facility] at [phone]. Patient also advised to call 911 for a medical emergency such as severe shortness of breath. Brett Church Koleson Reifsteck, LPN

## 2019-11-02 NOTE — Unmapped (Signed)
Pt requesting refill of metformin.  Bactrim is expired requesting only this med

## 2019-11-03 NOTE — Unmapped (Signed)
Feels like overall doing better. Breathing is ok. Is back to his normal coughing and wheezing (baseline). It's tolerable. Consistent w/how he's felt for several years.  Chest feels like sputum is loosening up. Cough more productive like it has been in the past. Wonders if he needs abx for the bacteria he's had in the past.  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   I let him know I'm not sure and will pass along to pulm. Likely would benefit for virtual follow up with them. Seems better from COVID standpoint.

## 2019-11-03 NOTE — Unmapped (Signed)
-----   Message from [employer], LPN sent at 16/08/9603 10:56 AM EST -----  Pt Covid positive follow up: Pt still has a bad cough I could hear while on the phone with him. He is  questioning if he needs to be back on an antibiotic for a sputum testing that took place several weeks back. He stated that the test came back with it showing bacteria still present and they got Covid and nothing has been addressed since then. He is not sure you could help on that one. And, he needs a refill for Bactrim and metformin which I will T up and send to you.

## 2019-11-06 DIAGNOSIS — J479 Bronchiectasis, uncomplicated: Principal | ICD-10-CM

## 2019-11-06 MED ORDER — ADVAIR HFA 230 MCG-21 MCG/ACTUATION AEROSOL INHALER
RESPIRATORY_TRACT | 3 refills | 0 days | Status: CP
Start: 2019-11-06 — End: 2020-11-05

## 2019-11-06 MED FILL — SULFAMETHOXAZOLE 400 MG-TRIMETHOPRIM 80 MG TABLET: 30 days supply | Qty: 60 | Fill #0 | Status: AC

## 2019-11-06 MED FILL — FLUTICASONE PROPIONATE 50 MCG/ACTUATION NASAL SPRAY,SUSPENSION: 30 days supply | Qty: 16 | Fill #4 | Status: AC

## 2019-11-06 MED FILL — METFORMIN ER 500 MG TABLET,EXTENDED RELEASE 24 HR: 90 days supply | Qty: 360 | Fill #0 | Status: AC

## 2019-11-06 MED FILL — FLUTICASONE PROPIONATE 50 MCG/ACTUATION NASAL SPRAY,SUSPENSION: NASAL | 30 days supply | Qty: 16 | Fill #4

## 2019-11-06 NOTE — Unmapped (Signed)
11/06/19  [name]  [birth year]    COVID OUTREACH:     Outreach to patient for positive result outreach .   Lab Results   Component Value Date    SARSCOVID Detected (A) 10/23/2019        OUTREACH SYMPTOMS & NEEDS:     Called to check-in: patient currently notes the following symptoms: no new symptoms. .  cough  Overall would you say you are better, same or worse? better    Social situation:   Do you have someone at home who is helping to take care of you? Yes.  Do you have any concerns about not being able to get enough food? No.  Refer to 211 or social work if needed.    DISPOSITION:     Patient: advised no further check-in needed and to call back with any questions, concerns, or worsening.   Work note provided: no, patient declined at this time.  Patient advised the following based on result and current symptoms: because you have a positive COVID test but are asymptomatic, please continue to monitor for symptoms.  Common symptoms of COVID-19 include fever or chills, cough or shortness of breath, loss of sense of smell or taste, diarrhea or nausea and headaches. We recommend, even if you are not showing any symptoms, you self-isolate and remain in your home.  If you do not develop any symptoms, you can discontinue isolation after at least ten (10) days have passed since you tested positive for COVID-19. Please notify us if you develop symptoms.  We also recommend that members of your household remain in the home if they continue to have close contact with you while you are contagious and self-isolating.  Household members will also need to remain in the home for 14 days after their last close contact with you while you are self-isolating.  Household members should also self-monitor for any fever and symptoms of COVID-19 such as cough, shortness of breath, sore throat or loss of smell or taste. If household members develop symptoms, they should contact their primary care provider.. If there are any changes or worsening of symptoms patient advised to contact their PCP. Patient also advised to call 911 for a medical emergency such as severe shortness of breath. and [facility] at [phone]. Patient also advised to call 911 for a medical emergency such as severe shortness of breath. Pollie Meyer, LPN

## 2019-11-06 NOTE — Unmapped (Signed)
Reviewed this prescription. E\venthough it came with changes requested, from Dr. Garner Nash last note on 09/05/2019 this was how he was taking.     Routing to Provider for co-sign.     Thank you

## 2019-11-06 NOTE — Unmapped (Signed)
Spoke with pt and his wife and he is doing well.  Cont cough but better. No further outreach needed

## 2019-11-07 DIAGNOSIS — J479 Bronchiectasis, uncomplicated: Principal | ICD-10-CM

## 2019-11-07 NOTE — Unmapped (Signed)
Per test claim for Advair at the [facility], patient needs Medication Assistance Program for High Copay.

## 2019-11-13 NOTE — Unmapped (Signed)
Returned [name] call. He continues to have some lingering symptoms in regards to his cough and sputum, but is doing okay from a COVID-19 infection standpoint.     He states that he may not be eligible for the [facility] and that his copays may not be affordable. Same may be for other medications.     I reviewed his referral and also look like MFr assistance has been initiated on his other medications.     Let him know I would discuss with team on this.     In regards to his sputum cultures (10/17/19-results below). He was inquiring and if there was anything different needed with his therapy.     Please let me know if I can relay or assist with anything for [name].        AFB Culture Overgrown with bacteria/fungus; unable to evaluate for acid fast bacilli.          Specimen Source: SPUTUM EXPECTORATED      Resulting Agency: [facility]         Specimen Collected: 10/17/19 16:00 Last Resulted: 10/31/19 12:04                  Lower Respiratory Culture 4+ Burkholderia gladioliAbnormal     Susceptibility Testing By Consultation Only   2+ Oropharyngeal Flora Isolated          Specimen Source: SPUTUM EXPECTORATED

## 2019-11-17 DIAGNOSIS — J470 Bronchiectasis with acute lower respiratory infection: Principal | ICD-10-CM

## 2019-11-18 NOTE — Unmapped (Signed)
Ms. Brett Hanna today. Returned her call. She said she was able to find him new insurance that will decrease their OOP for medications Will go into effect in February.     She also asked about a formulary exception form that MD would need to complete.     I asked her kindly if she would-when able, to please send a MyChart message with as much ??information on that new insurance and the questions they may have.     When information is received will notify team.

## 2019-11-20 DIAGNOSIS — J479 Bronchiectasis, uncomplicated: Principal | ICD-10-CM

## 2019-11-27 NOTE — Unmapped (Signed)
Spoke with patient and he states he will have new insurance starting Feb 1st. 2021. In the mean time [facility] was able to get his Colistimethate approved thru his current insurance with [employer] (Medicare Part D) Plan at $730.96 per fill. Patient states he can not afford paying that kind of money. Advised the patient that we are also working to get MFG assistance for his Castyn and Advair, but the MAP tech needs income verification and application sign. Patient states he will attempt to send the information over to MAP tech. And in the mean time I advised the patient that we will reach out to him on Feb 1st, 2021 and once we obtain the new insurance information we will go from there.

## 2019-12-01 NOTE — Unmapped (Signed)
Called and followed-up with [name] regarding prescription coverage they had called about:    They still have not gotten the physical card in the mail but have provided me with the information.     [facility]    RX ID # [id number]  RX BIN [id number]  Group: [id number]    RXTCN: [id number]    Update: Pharmacy team has spoken to them today and obtained information.

## 2019-12-04 NOTE — Unmapped (Signed)
Spoke with patient and obtained his new insurance and inputted into [employer].  Will forward updated information to MAP team and let them figure out what the co-pays are going to be.   Patient did mention that he is about to be running out of medications and is also experiencing burkholderia bacteria and would like for [facility] to contact MD and see if they can recommend any antibiotics.

## 2019-12-08 DIAGNOSIS — J470 Bronchiectasis with acute lower respiratory infection: Principal | ICD-10-CM

## 2019-12-08 NOTE — Unmapped (Signed)
Received call from pt's wife with pt on phone.  They were inquiring on when Dr.Daniels would be back to clinic. I informed them around 12/18/19. They then were wondering if pt should be taking Cipro  Pt stated that he is unable to get his injections until March and thinks he should be on Cipro until then.  Routing to Nurse coordinator for review.    Call back for wife is [phone].

## 2019-12-09 NOTE — Unmapped (Signed)
Hi Dr. Garner Nash,  I wasn't sure if he's supposed to continue on this or not.  Thanks!  Edson Snowball

## 2019-12-12 ENCOUNTER — Encounter: Admit: 2019-12-12 | Discharge: 2019-12-13 | Payer: PRIVATE HEALTH INSURANCE

## 2019-12-12 DIAGNOSIS — R895 Abnormal microbiological findings in specimens from other organs, systems and tissues: Principal | ICD-10-CM

## 2019-12-12 DIAGNOSIS — J470 Bronchiectasis with acute lower respiratory infection: Principal | ICD-10-CM

## 2019-12-12 DIAGNOSIS — E119 Type 2 diabetes mellitus without complications: Principal | ICD-10-CM

## 2019-12-12 LAB — CREATININE, URINE: Lab: 56.3

## 2019-12-12 LAB — ALBUMIN / CREATININE URINE RATIO

## 2019-12-12 MED ORDER — SULFAMETHOXAZOLE 400 MG-TRIMETHOPRIM 80 MG TABLET: 1 | tablet | Freq: Two times a day (BID) | 0 refills | 30 days | Status: AC

## 2019-12-12 MED ORDER — TOPIRAMATE 100 MG TABLET
ORAL_TABLET | 3 refills | 0 days | Status: CP
Start: 2019-12-12 — End: 2020-12-11

## 2019-12-12 MED ORDER — SULFAMETHOXAZOLE 400 MG-TRIMETHOPRIM 80 MG TABLET
ORAL_TABLET | Freq: Two times a day (BID) | ORAL | 0 refills | 7.00000 days | Status: CP
Start: 2019-12-12 — End: 2020-01-11
  Filled 2019-12-12: qty 60, 30d supply, fill #0

## 2019-12-12 MED ORDER — FEXOFENADINE 180 MG TABLET
ORAL_TABLET | Freq: Every day | ORAL | 3 refills | 90.00000 days | Status: CP
Start: 2019-12-12 — End: ?
  Filled 2019-12-13: qty 90, 90d supply, fill #0

## 2019-12-12 MED FILL — SULFAMETHOXAZOLE 400 MG-TRIMETHOPRIM 80 MG TABLET: 30 days supply | Qty: 60 | Fill #0 | Status: AC

## 2019-12-12 NOTE — Unmapped (Signed)
Urine was collected and sent to lab

## 2019-12-12 NOTE — Unmapped (Addendum)
Call Pulmonary to set up follow up  Your A1C was better today at 6.6  Keep up the good work with weight loss

## 2019-12-12 NOTE — Unmapped (Signed)
Assessment and Plan    1. Type 2 diabetes mellitus without complication, without long-term current use of insulin (CMS-HCC)    2. Bronchiectasis with acute lower respiratory infection (CMS-HCC)    3. Burkholderia gladioli culture positive        #1. DM2. A1C today improved to 6.6 with addition of Jardiance. Some increased urination but tolerable from his perspective. Weight is down as well.   -check urine microalbumin today  -Cont present management.    #2. Recent COVID infection. Feels back to baseline. S/p monoclonal antibody. He knows he'll need to wait 90 days after monoclonal for covid vaccine.    #3. Bronchiectasis/multiple pulm infections/burkholdoria infections. Advised scheduling follow up with Dr. Garner Nash. I sent in bactrim so he won't run out before he sees her.      Orders Placed This Encounter   Procedures   ??? Microalbumin / creatinine urine ratio         Follow up: Return in about 3 months (around 03/10/2020), or if symptoms worsen or fail to improve.        Reason for visit: Follow-up  , bronchiectasis, dm    Brett Fogo Kozel Sr. is a 54 y.o. male who presents for follow up.    HPI  Cutting back on sweets. Main trouble is holidays when he tends to eat more.  Trying to eat healthier.   Tolerating Jardiance-does make him urinate more.  When leans over coughs up sputum. Has been like that for awhile. No hemoptysis. No fevers. Breathing is stable.    No migraines recently. Not always taking the topamax.     Feels back to his baseline from having COVID.    Medications were reviewed    Physical Exam  Gen: appears thinner than before, no acute distress  Vitals:    12/12/19 1001 12/12/19 1003 12/12/19 1004   BP: 116/72 128/78 124/76   Pulse: 75 72 73   Resp: 18     Temp: 36.8 ??C (98.2 ??F)     TempSrc: Tympanic     SpO2: 95%     Weight: 82.3 kg (181 lb 6.4 oz)     Height: 170.2 cm (5' 7)       Psych: nl mood and affect      Foot Exam Performed: Brief Foot Exam (Monofilament)   Monofilament Test: 8 of 8 sites normal  Skin: Normal  Nails: Normal  Foot Deformities: None      Wt Readings from Last 6 Encounters:   12/12/19 82.3 kg (181 lb 6.4 oz)   10/11/19 84.6 kg (186 lb 8 oz)   09/08/19 84.3 kg (185 lb 12.8 oz)   09/05/19 88 kg (194 lb)   01/03/19 83.9 kg (185 lb)   01/03/19 84.1 kg (185 lb 4.8 oz)     Reviewed prior sputum cultures  Last A1C 8.2. Today it is 6.6    Medication adherence and barriers to the treatment plan have been addressed. Opportunities to optimize healthy behaviors have been discussed. Patient / caregiver voiced understanding.

## 2019-12-13 MED FILL — FEXOFENADINE 180 MG TABLET: 90 days supply | Qty: 90 | Fill #0 | Status: AC

## 2019-12-18 DIAGNOSIS — J479 Bronchiectasis, uncomplicated: Principal | ICD-10-CM

## 2020-01-01 DIAGNOSIS — E119 Type 2 diabetes mellitus without complications: Principal | ICD-10-CM

## 2020-01-02 NOTE — Unmapped (Signed)
Received VM from Ms. Bashum.     She was checking to see if we had received anything regarding a formulary exception for his medications.     Said they went to pick up medications yesterday and some were very expensive.     I attempted to call her back but received her voicemail.     Please let me know if there is anything I need to follow-up on.

## 2020-01-03 NOTE — Unmapped (Signed)
[facility] Pharmacist Note     Purpose: Medication Management   ? Copied [name]'s message below on status of medication assistance for Brett Simpler Sr.:    Spoke with Brett Hanna last week as I was waiting for the sig waiver form to be returned to me. ??She seemed to be aware of the process and what was going on at that time.     I was able to get the colistimethate down to a tier 2 but the main issue is Brett Hanna has a ~$460 deductible with his plan. ??After the deductible is met the colistimethate will have a ~$3-8 copay.     Brett Hanna knows that the tier reduction was approved but the copay is still high. ??She is also aware that I'm working on assistance for the Advair and [place]. ??She expected the process to start this week since the form was received Monday through the mail.     Yesterday [facility] sent me a referral for Jardiance due to a high copay and stated that patient is aware of having to pay the deductible.     That's what I'm currently working on for Brett Hanna. ??I'm not sure of the medications that she went to pick up and can't speak on those. ??     Let me know if you have other questions!     Electronically signed:  Anell Barr, PharmD, BCPS, CPP  Clinical Pharmacist Practitioner  [facility]

## 2020-01-08 MED FILL — MONTELUKAST 10 MG TABLET: 90 days supply | Qty: 90 | Fill #3 | Status: AC

## 2020-01-08 MED FILL — MONTELUKAST 10 MG TABLET: ORAL | 90 days supply | Qty: 90 | Fill #3

## 2020-01-08 MED FILL — FLUTICASONE PROPIONATE 50 MCG/ACTUATION NASAL SPRAY,SUSPENSION: NASAL | 30 days supply | Qty: 16 | Fill #5

## 2020-01-08 MED FILL — FLUTICASONE PROPIONATE 50 MCG/ACTUATION NASAL SPRAY,SUSPENSION: 30 days supply | Qty: 16 | Fill #5 | Status: AC

## 2020-01-08 NOTE — Unmapped (Signed)
Bronchiectasis Clinic Pharmacist Visit     Brett Simpler Sr. is a 54 y.o. male with bronchiectasis, seen for for medication review.   Had COVID-19 late December 2020, recovered.   Lately, has had many issues with medication insurance coverage of medications he's on. Cannot afford colistin, even after Tier exception (Brett Hanna was able to get colistin from Tier 4 to Tier 2) but he still has ~$460 RX deductible to be met first. Brett Hanna also currently attempting to assist with Advair, Cayston, and Jardiance assistance.     Patient is here with his wife, they had many questions about why their Medicare plans are so different but likely due to Brett Hanna having a slightly higher income when he was working.      Patient reported:  ? Barriers to adherence: Cost of medication  ? Side Effects reported: No   ? Pharmacy insurance status: Medicare Part D   ? Preferred pharmacy: Local pharmacy and [facility]    MEDICATION REVIEW   Reviewed the indication, dose, and frequency of each medication with patient.   Medication Sig Comments/Adherence   ??? acetaminophen (TYLENOL EXTRA STRENGTH) 500 MG tablet Take 1 tablet by mouth as needed for pain PRN, for headaches   ??? albuterol 2.5 mg /3 mL (0.083 %) nebulizer solution Inhale the contents of 1 vial (3 mL) via nebulizer twice daily and every 6 hours as needed for shortness of breath and wheezing BID   ??? albuterol HFA 90 mcg/actuation inhaler Inhale 2 puffs every four (4) hours as needed for wheezing PRN, usually at night   ??? atorvastatin (LIPITOR) 20 MG tablet Take 1 tablet (20 mg) by mouth daily Taking   ??? aztreonam lysine (CAYSTON) 75 mg/mL Nebu nebulization solution Inhale 1 mL (75 mg total) by nebulization every eight (8) hours. Nebulize every other month after airway clearance using Altera nebulizer Not taking, pending MFG assistance   ??? cetirizine (ZYRTEC) 10 MG tablet Take 1 tablet (10 mg total) by mouth daily. Taking   ??? cholecalciferol, vitamin D3, (VITAMIN D3) 2,000 unit cap Take 1 capsule (2,000 Units total) by mouth daily. Taking ([employer]'s Made brand)   ??? colistimethate (COLYMYCIN) 150 mg injection Inject 2mL of sterile water for injection to mix Colistin Vial.Draw up 2mL (150mg ) mixed colistin & add 1mL SWFI for total volume=68mL. Inhale 3mL twice a day,every other month. Not taking, needs to request fill   ??? empagliflozin (JARDIANCE) 25 mg tablet Take 1 tablet (25 mg total) by mouth daily. Taking, has <1 week supply remaining, high copay issue   ??? fexofenadine (ALLEGRA) 180 MG tablet Take 1 tablet (180 mg total) by mouth daily. Taking, alternates with cetirizine   ??? fluticasone propion-salmeteroL (ADVAIR HFA) 230-21 mcg/actuation inhaler Inhale 2 puffs by mouth twice daily Taking, also running low   ??? fluticasone propionate (FLONASE) 50 mcg/actuation nasal spray Use 2 sprays into each nostril daily Taking diff: BID   ??? metFORMIN (GLUCOPHAGE-XR) 500 MG 24 hr tablet Take 4 tablets (2,000 mg total) by mouth daily with evening meal. Taking   ??? montelukast (SINGULAIR) 10 mg tablet Take 1 tablet by mouth nightly Taking   ??? omeprazole (PRILOSEC) 20 MG capsule Take 1 capsule (20 mg total) by mouth two (2) times a day. Taking   ??? sertraline (ZOLOFT) 50 MG tablet Take 1 tablet by mouth once daily Taking diff: mostly every other day, doesn't feel it's that helpful   ??? sod bicarb-sod chlor-neti pot (AYR SALINE NASAL NETI RINSE) pkdv Use  twice per day Taking   ??? sodium chloride 7% 7 % Nebu Inhale 4 mL by nebulization Two (2) times a day. Taking   ??? sulfamethoxazole-trimethoprim (BACTRIM) 400-80 mg per tablet Take 1 tablet by mouth twice daily. Taking   ??? topiramate (TOPAMAX) 100 MG tablet Take 1 tablet (100 mg total) by mouth nightly. Taking     RECOMMENDATIONS     BRONCHIECTASIS   PFTs today have declined.   ? Airway clearance: Completing as prescribed.  ? ID/Antibiotics: Chronically grows B. gladioli, OPF. Last needed oral/IV antibiotics 08/2019 (Augmentin). Has frequent exacerbations. Cycles inhaled antibiotics: Cayston and inhaled colistin but this facing medication access challenges. On chronic bactrim. Hasn't grown MAC since 2015, has h/o chronic azithromycin use, could consider adding for anti-inflammatory benefits.  o Continue medications as prescribed.     MEDICATION MANAGEMENT   ? Adherence: Patient is unable to adhere due to challenges with medication access.  ? Access: Pending medication assistance; have encouraged patient to fill Advair HFA to help meet deductible. Once deductible is met, inhaled colistin would be Tier 2 cost.  ? Prescription Renewals: Sent to pharmacy: bactrim, montelukast, HTS 7%    MEDICATION CHANGES AFTER VISIT:  ? After MD visit: getting 14-day course of ciprofloxacin    FOLLOW UP   ? CPP Visit: PRN, encouraged to reach out during open enrollment in the fall to discuss medication managements  ? Labs:  o BMP at next follow up visit    Time spent with patient: 30 minutes  Recommendations and medication-related problems were discussed directly with pulmonologist, M. Rolm Baptise    Electronically signed:  Alton Revere, PharmD, BCPS, CPP  Clinical Pharmacist Practitioner  [facility]    I am located on-site and the patient is located on-site for this visit.

## 2020-01-09 ENCOUNTER — Encounter
Admit: 2020-01-09 | Discharge: 2020-01-10 | Payer: PRIVATE HEALTH INSURANCE | Attending: Internal Medicine | Primary: Internal Medicine

## 2020-01-09 ENCOUNTER — Encounter: Admit: 2020-01-09 | Discharge: 2020-01-10 | Payer: PRIVATE HEALTH INSURANCE

## 2020-01-09 ENCOUNTER — Encounter
Admit: 2020-01-09 | Discharge: 2020-01-10 | Payer: PRIVATE HEALTH INSURANCE | Attending: Pharmacist | Primary: Pharmacist

## 2020-01-09 DIAGNOSIS — R895 Abnormal microbiological findings in specimens from other organs, systems and tissues: Principal | ICD-10-CM

## 2020-01-09 DIAGNOSIS — J479 Bronchiectasis, uncomplicated: Principal | ICD-10-CM

## 2020-01-09 DIAGNOSIS — Z148 Genetic carrier of other disease: Principal | ICD-10-CM

## 2020-01-09 MED ORDER — CIPROFLOXACIN 750 MG TABLET
ORAL_TABLET | Freq: Two times a day (BID) | ORAL | 0 refills | 21.00000 days | Status: CP
Start: 2020-01-09 — End: 2020-01-30
  Filled 2020-01-09: qty 42, 21d supply, fill #0

## 2020-01-09 MED FILL — CIPROFLOXACIN 750 MG TABLET: 21 days supply | Qty: 42 | Fill #0 | Status: AC

## 2020-01-09 NOTE — Unmapped (Addendum)
Try elevating your bottom when laying down to be higher than your shoulders. That should help clear the secretions from your lower lungs.    Ciprofloxacin for 3 weeks.  Will adjust based on culture results.    6 minute walk test - you do not need supplemental oxygen.    Symptoms of a bronchiectasis exacerbation: 48 hours of at least three of the following:  ?? Increased cough  ?? Change in volume or appearance of sputum  ?? Increased sputum purulence  ?? Worsening shortness of breath and/or exercise tolerance  ?? Fatigue and/or malaise  ?? Coughing up blood (hemoptysis)    If you are experiencing some of these symptoms:  ?? Increase the frequency and/or intensity of your airway clearance  ?? Try to submit a sputum sample for bacterial and AFB cultures (at [facility] or locally)  ?? Reach out to me or your local physician.  ?? If you need antibiotics, recommend treating for 14 days.      Please bring any new airway clearance equipment to your next visit to ensure that you are using and caring for it properly.      Information about the COVID-19 vaccines: yourshot.org    Interested in participating in COVID-19 vaccine trials? https://www.coronaviruspreventionnetwork.org    Thank you for allowing me to be a part of your care. Please call the clinic with any questions.    Viona Gilmore, MD, MPH  Pulmonary and Critical Care Medicine  [address]  [id number]  [place], [place] [address]    Thank you for your visit to the [facility]. You may receive a survey from [facility] regarding your visit today, and we are eager to use this feedback to improve your experience. Thank you for taking the time to fill it out.    Between appointments, you can reach [place] at these numbers:  ? For appointments: [phone]  ? For my nurse, Arsenio Loader RN: [phone]  ? Fax: [phone]  ? For urgent issues after hours: Hospital Operator @ [phone] & ask for Pulmonary Fellow on call    For further information, check out the websites below:    [facility] for [facility]chie[facility]asis: ScienceMakers.nl    Impact Airway Clearance Education: http://www.impact-be.com    Videos from the patient session of the World Bronchiectasis Conference in [place] on May 15, 2017 can be watched here (TrustyNews.es).     Bronchiectasis Toolbox (information about airway clearance): DiningCalendar.de    Copy for Individuals with Bronchiectasis and/or NTM through the Bronchiectasis Registry and the COPD Foundation: https://www.mckee-young.org/    Information about Non-tuberculous Mycobacterial Infections:  https://www.ntminfo.org     Interested in clinical trials and other research opportunities?  www.clinicaltrials.gov    The Rare Diseases Clinical Research Network [facility]) Genetic Disorders of  Mucociliary Clearance Consortium [facility]) Contact Registry is a way for patients with disorders of mucociliary clearance (such as bronchiectasis) and their family members to learn about research studies they may be able to join. Participation is completely voluntary and you may choose to withdraw at any time. There is no cost to join the [employer]. For more information or to join the registry please go to the following website:  [email or web address]

## 2020-01-09 NOTE — Unmapped (Signed)
As per providers orders, took pt on a un -official 6 min walk. Pt maintained 90-91% for 6 minutes. Pt tolerated walk well.

## 2020-01-09 NOTE — Unmapped (Signed)
[facility]    01/09/20    11:50 AM    Assessment:      Patient:Brett Brett Hanna Frees Sr. (1966/06/02)    Brett Hanna is a 54 y.o. male who is seen for follow-up of bronchiectasis secondary to alpha-1 antitrypsin carrier state with prior NTM infection status post treatment, OSA, and Burkholderia gladioli colonization.  His marked decline in his spirometry may be due to an acute bronchiectasis exacerbation but could also be attributed to his prior COVID-19 infection.  He clearly has worsening symptoms in the setting of discontinuation of inhaled antibiotics.     Plan:      Problem List Items Addressed This Visit        Respiratory    Bronchiectasis (CMS-HCC) - Primary    Relevant Orders    AFB SMEAR (Completed)    Flow volume loop       Other    Alpha-1-antitrypsin deficiency carrier    Burkholderia gladioli culture positive    Relevant Medications    ciprofloxacin HCl (CIPRO) 750 MG tablet        I started Brett Hanna back on ciprofloxacin to cover his Burkholderia.  However, this course is for 3 weeks rather than the 2 weeks I previously treated him with.  He spontaneously expectorated sputum sample was sent for bacterial and AFB cultures.  I will reach out to him if his antibiotics need to be adjusted based on these results.    We discussed the role of postural drainage and assisting his airway clearance.  He notices that more sputum comes up when he bends forward, corresponding to the basilar distribution of his bronchiectasis.  I suggested that he elevate his pelvis while laying down so that the lower portion of his lungs are elevated higher than his trachea.  He can then perform huff coughing.  I reminded him not to do this after eating due to risk of reflux and aspiration.    Because his oxygen saturations were 91% at rest in clinic, I had the staff walk him to see if he needed supplemental oxygen.  He did not desaturate below 89% and therefore, does not qualify for supplemental oxygen.    Since I am unable to use inhaled antibiotics for Brett Hanna due to his insurance, we will need to consider other options to decrease his exacerbation frequency.  One option would be chronic macrolide therapy.  He last cultured MAC in May 2017.  Although his last AFB culture here was overgrown, other AFB cultures have not yielded MAC.  Another possibility would be participating in the phase 3 clinical trial for brensocatib, a novel oral anti-inflammatory agent that is not an antibiotic.  I would need to confirm that his known fatty liver disease does not exclude him from participation.    Return in about 3 months (around 04/10/2020) for Recheck and FVL.      The above plan was discussed with the patient and he is in agreement.       Subjective:      Patient's primary [place] pulmonologist: Brett Hanna    HPI: Brett Hanna is a 54 y.o. male who is seen for follow-up of bronchiectasis secondary to alpha-1 antitrypsin carrier state with prior NTM infection status post treatment, OSA, and Burkholderia gladioli colonization.    09/05/19:  I last saw him in clinic on 11/01/18. He reports having a terrible time with wheezing. Having more frequent coughing spells with more sputum volume. Better for few hours and  then returns. If leans over to tie shoes, starts coughing and chokes on volume of mucus. Then can cough up blood during those spells. Describes hemoptysis as blood mixed with sputum (more than streaks but not frank blood). Can get up in morning and be okay some days. Other days gets coughing spells and then gets headache and can't do anything. Started Cipro yesterday; has taken two doses so far and thinks already helping this morning. Didn't feel like response to Augmentin was sustained.  Supposed to be starting Cayston today but is unsure if he has new supply (has to check with wife). Has another day worth of Colistin.  Tightness over RUQ comes and goes. If bends towards right, gets spasm. Supposed to get flu shot on Friday.  Has questions about whether face shield is just as effective as wearing a mask to prevent COVID infection. Has trouble tolerating wearing a mask while seated - has back pain.    01/09/20:  At his last visit, I treated him with Cipro for 2 weeks for newly cultured Burkholderia gladioli.  He feels like it helps somewhat but did not completely resolve his symptoms.  Subsequent culture in December was positive for B gladioli.  He had COVID-19 diagnosed 10/23/19 and received a monoclonal antibody therapy.  He tells me today that he does not think that the therapy was very helpful to him; his wife disagrees.  Unfortunately, Brett Hanna has been off of his inhaled antibiotics due to high co-pay.  He feels that this is worsened his cough and breathing.  He is now having coughing attacks and notes more chest discomfort when coughing.  The coughing attacks started prior to discontinuation of inhaled antibiotics but have now worsened.  He is expectorating brown and somewhat bloody sputum although he is unsure if the blood in the sputum is coming from his sinuses since he has experienced more frequent nosebleeds recently.  He tolerated the Cipro very well aside from having some muscle stiffness in the morning.  Has been losing weight despite his appetite remaining robust.  He and his wife attribute this to his Jardiance.    Past Medical History:   Diagnosis Date   ??? Allergic rhinitis    ??? Alpha-1-antitrypsin deficiency carrier     with slightly low levels   ??? Amblyopia    ??? Asthma    ??? Asthma, extrinsic    ??? Bronchiectasis (CMS-HCC) 06/12/2011   ??? Chronic sinusitis    ??? Colon polyps    ??? COVID-19 10/24/2019   ??? Diabetes mellitus (CMS-HCC)    ??? GERD (gastroesophageal reflux disease)    ??? H/O Mycobacterium avium complex infection    ??? Impaired glucose tolerance    ??? Obesity    ??? Pneumonia    ??? Pseudomonas respiratory infection    ??? 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 Sleep apnea    ??? Strabismus    ??? Strep throat        Past Surgical History:   Procedure Laterality Date   ??? BRONCHOSCOPY     ??? LUNG BIOPSY     ??? PR BRONCHOSCOPY,DIAGNOSTIC W BRUSH Bilateral 08/21/2013    Procedure: BRONCHOSCOPY, RIGID OR FLEXIBLE, INCLUDING FLOURO GUIDED; DIAGNOSTIC, WITH BRUSHING OR PROTECTED BRUSHINGS;  Surgeon: Hanna Dimmer, MD;  Location: [facility];  Service: Pulmonary   ??? PR BRONCHOSCOPY,DIAGNOSTIC W LAVAGE Bilateral 08/21/2013    Procedure: BRONCHOSCOPY, RIGID OR FLEXIBLE, INCLUDE FLUOROSCOPIC GUIDANCE WHEN PERFORMED; W/BRONCHIAL ALVEOLAR LAVAGE;  Surgeon: Virgia Land  Brett Nash, MD;  Location: [facility] PROCED[facility];  Service: Pulmonary   ??? PR COLONOSCOPY FLX DX W/COLLJ SPEC WHEN PFRMD N/A 03/21/2015    Procedure: COLONOSCOPY, FLEXIBLE, PROXIMAL TO SPLENIC FLEXURE; DIAGNOSTIC, W/WO COLLECTION SPECIMEN BY BRUSH OR WASH;  Surgeon: Brendia Sacks, MD;  Location: [facility];  Service: Gastroenterology   ??? SHOULDER SURGERY Right        Family History   Problem Relation Age of Onset   ??? Colon cancer Father    ??? Emphysema Maternal Uncle    ??? Heart disease Maternal Uncle    ??? Diabetes Paternal Grandfather        Social History     Tobacco Use   ??? Smoking status: Never Smoker   ??? Smokeless tobacco: Former Estate agent Use Topics   ??? Alcohol use: No     Alcohol/week: 0.0 standard drinks   ??? Drug use: No       Allergies  Reviewed at 11:04 AM      Reactions Comment    Codeine Shortness Of Breath, Rash     Rifampin  Fevers, chills.    Daliresp [roflumilast] Other (See Comments) Back pain          Current Outpatient Medications   Medication Sig Dispense Refill   ??? acetaminophen (TYLENOL EXTRA STRENGTH) 500 MG tablet Take 1 tablet by mouth as needed for pain     ??? albuterol 2.5 mg /3 mL (0.083 %) nebulizer solution Inhale the contents of 1 vial (3 mL) via nebulizer twice daily and every 6 hours as needed for shortness of breath and wheezing 360 mL 14   ??? albuterol HFA 90 mcg/actuation inhaler Inhale 2 puffs every four (4) hours as needed for wheezing 18 g 11   ??? alcohol swabs PadM Use as directed with inhaled antibiotics. 100 each 5   ??? atorvastatin (LIPITOR) 20 MG tablet Take 1 tablet (20 mg) by mouth daily 90 tablet 3   ??? aztreonam lysine (CAYSTON) 75 mg/mL Nebu nebulization solution Inhale 1 mL (75 mg total) by nebulization every eight (8) hours. Nebulize every other month after airway clearance using Altera nebulizer (Patient not taking: Reported on 01/09/2020) 84 mL 5   ??? blood-glucose meter kit Check sugars once daily. 1 each 0   ??? blood-glucose meter Misc USE TO CHECK BLOOD SUGAR ONCE DAILY 1 each 0   ??? cetirizine (ZYRTEC) 10 MG tablet Take 1 tablet (10 mg total) by mouth daily. 90 tablet 3   ??? cholecalciferol, vitamin D3, (VITAMIN D3) 2,000 unit cap Take 1 capsule (2,000 Units total) by mouth daily. 30 capsule prn   ??? colistimethate (COLYMYCIN) 150 mg injection Inject 2mL of sterile water for injection to mix Colistin Vial.Draw up 2mL (150mg ) mixed colistin & add 1mL SWFI for total volume=55mL. Inhale 3mL twice a day,every other month. 60 each 5   ??? empagliflozin (JARDIANCE) 25 mg tablet Take 1 tablet (25 mg total) by mouth daily. 90 tablet 3   ??? fexofenadine (ALLEGRA) 180 MG tablet Take 1 tablet (180 mg total) by mouth daily. 90 tablet 3   ??? fluticasone propion-salmeteroL (ADVAIR HFA) 230-21 mcg/actuation inhaler Inhale 2 puffs by mouth twice daily 36 g 3   ??? fluticasone propionate (FLONASE) 50 mcg/actuation nasal spray Use 2 sprays into each nostril daily (Patient taking differently: 2 sprays by Each Nare route Two (2) times a day. ) 16 g 5   ??? lancets Misc 1 each by Other route  once daily. Test daily before breakfast. Dispense 90 day supply. 100 each 3   ??? metFORMIN (GLUCOPHAGE-XR) 500 MG 24 hr tablet Take 4 tablets (2,000 mg total) by mouth daily with evening meal. 360 tablet 3   ??? MISCELLANEOUS MEDICAL SUPPLY MISC Frequency:PHARMDIR   Dosage:0.0     Instructions:  Note:CPAP 15 cm H20 with heated humidity for nasal dryness.  Mask: ResMed Quattro size Medium. Dx OSA. Dose: 1     ??? montelukast (SINGULAIR) 10 mg tablet Take 1 tablet by mouth nightly 90 each 3   ??? nebulizers Misc Use with inhaled medication. 3 each 3   ??? nystatin (MYCOSTATIN) 100,000 unit/mL suspension Take 5 mL by mouth Four (4) times a day.      ??? omeprazole (PRILOSEC) 20 MG capsule Take 1 capsule (20 mg total) by mouth two (2) times a day. 180 capsule 3   ??? sertraline (ZOLOFT) 50 MG tablet Take 1 tablet by mouth once daily 90 tablet 2   ??? sod bicarb-sod chlor-neti pot (AYR SALINE NASAL NETI RINSE) pkdv Use twice per day     ??? sodium chloride 7% 7 % Nebu Inhale 4 mL by nebulization Two (2) times a day. 720 mL 3   ??? sterile water, PF, Soln Use 2mL to mix colistin, then add 1mL sterile water for injection into neb cup along with colistin.Total volume 3mL 600 mL 5   ??? sulfamethoxazole-trimethoprim (BACTRIM) 400-80 mg per tablet Take 1 tablet by mouth twice daily. 14 tablet 0   ??? syringe with needle (BD LUER-LOK SYRINGE) 3 mL 20 gauge x 1 Syrg Use as directed with inhaled antibiotics. 60 each 5   ??? topiramate (TOPAMAX) 100 MG tablet Take 1 tablet (100 mg total) by mouth nightly. 90 tablet 3     No current facility-administered medications for this visit.      Physical Exam:  BP 133/83 (BP Site: L Arm, BP Position: Sitting, BP Cuff Size: Medium)  - Pulse 75  - Temp 36.8 ??C (98.2 ??F) (Temporal)  - Ht 167.6 cm (5' 6)  - Wt 81.2 kg (179 lb)  - SpO2 92% Comment: fluctuating 91-92% on room air - BMI 28.89 kg/m??   Obese white male appearing comfortable and in no acute distress.  Face is thinner than prior.  Easy work of breathing without accessory muscle use.  Coughing after taking deep breaths.  Musical expiratory wheezing appreciated throughout all lung fields.  Speaking in full sentences easily.  Regular rate and rhythm with normal S1 and S2.  No murmurs, rubs, or gallops.      Diagnostic Review:   The following data were reviewed during this visit with key findings summarized below:    Pulmonary Function Testing:  ?? FVC (% predicted) FEV1 (% predicted) FEV1/FVC   05/12/13 3.48 L (80%) 2.92 L (85%) 84%   06/26/14 2.84 L (65%) 2.29 L (67%) 81%   03/25/17 2.95 L (67%) 2.44 L (71%) 83%   06/29/17 2.68 L (62%) 2.34 L (70%) 87%   11/23/17 2.62 L (61%) 2.20 L (66%) 84%   03/08/18 2.59 L (60%) 2.06 L (62%) 80%   06/21/18 2.76 L (64%) 2.04 L (62%) 74%   11/01/18 2.63 L (61%) 2.08 L (63%) 79%   01/09/20  2.29 L (55%)  1.84 L (55%)  80%   Measures obtained today are suggestive of moderate restriction and are worse as compared to 11/01/2018.    Cultures:  ?? Source Bacterial Culture AFB Smear AFB Culture  03/25/17 Sputum 3+ OPF; 3+ Stenotrophomonas negative negative   06/29/17 Sputum OPF negative Overgrown with bacteria/fungus   01/28/18 Sputum 2+ OPF; 2+ GNR glucose non-fermenter; Scedosporium negative 2 morphologies Streptomyces   02/01/18 Sputum 3+ OPF; 1+ GNR glucose non-fermenter; Scedosporium negative Streptomyces   03/02/18 Sputum 3+ OPF; 3+ GNR glucose non-fermenter; Scedosporium negative Streptomyces   08/07/19 Sputum 3+ OPF; 3+ Burkholderia gladioli negative Negative   10/17/19 Sputum 2+ OPF; 4+ Burkholderia gladioli negative Overgrown   ??  Imaging  Chest CT (03/18/18): Images personally reviewed. Bronchial wall thickening and bronchiectasis primarily involving the airways supplying the lingula and lung bases with associated clustered micronodular and tree-in-bud opacities. With respect to 02/02/2018, there is increased opacification at the deep posterior costophrenic recesses with more volume loss particularly at the left base, favored to represent atelectasis.  ??  Chest CT (02/02/18): Images personally reviewed. Bronchiectasis predominantly in bases of lingula, right middle lobe, and bilateral lower lobes (left greater than right), mild to moderately progressed since 03/30/2017. ?? New and increased nodular and tree-in-bud opacities, now involving all lobes. Patchy consolidations in the bases of the lingula, and lower lobes also stable to mildly increased. Also new left upper lobe nodular opacity with adjacent groundglass, 8 mm, (2:26). No definite reticular opacities, or honeycombing identified. No appreciable air trapping on expiratory imaging.  ??  Chest CT (03/30/17): Images personally reviewed. Interval worsening of bilateral lower lobe bronchiectasis and bronchial wall thickening with new lingular bronchiectasis. Increased clustered groundglass opacities with areas of consolidation in the lower lobes and lingula. Findings may represent sequela of endobronchial infection or impaired mucus clearance.  Bronchiectasis has been present on CT imaging since 06/12/11.    Portions of this record have been created using Scientist, clinical (histocompatibility and immunogenetics). Dictation errors have been sought, but may not have been identified and corrected.    Viona Gilmore, MD  01/09/20  11:50 AM

## 2020-01-10 MED ORDER — ADVAIR HFA 230 MCG-21 MCG/ACTUATION AEROSOL INHALER
Freq: Two times a day (BID) | RESPIRATORY_TRACT | 3 refills | 0.00000 days | Status: CP
Start: 2020-01-10 — End: ?

## 2020-01-10 MED ORDER — SODIUM CHLORIDE 7 % FOR NEBULIZATION
Freq: Two times a day (BID) | RESPIRATORY_TRACT | 3 refills | 90 days | Status: CP
Start: 2020-01-10 — End: 2021-01-09
  Filled 2020-01-15: qty 720, 90d supply, fill #0

## 2020-01-10 MED ORDER — MONTELUKAST 10 MG TABLET
ORAL_TABLET | Freq: Every evening | ORAL | 3 refills | 90 days | Status: CP
Start: 2020-01-10 — End: ?
  Filled 2020-03-26: qty 90, 90d supply, fill #0

## 2020-01-10 MED ORDER — SULFAMETHOXAZOLE 400 MG-TRIMETHOPRIM 80 MG TABLET
ORAL_TABLET | Freq: Two times a day (BID) | ORAL | 3 refills | 90.00000 days | Status: CP
Start: 2020-01-10 — End: ?
  Filled 2020-01-15: qty 180, 90d supply, fill #0

## 2020-01-10 MED ORDER — FLUTICASONE PROPIONATE 50 MCG/ACTUATION NASAL SPRAY,SUSPENSION
Freq: Every day | NASAL | 11 refills | 0 days | Status: CP
Start: 2020-01-10 — End: ?
  Filled 2020-02-20: qty 16, 30d supply, fill #0

## 2020-01-12 DIAGNOSIS — J4550 Severe persistent asthma, uncomplicated: Principal | ICD-10-CM

## 2020-01-12 DIAGNOSIS — J471 Bronchiectasis with (acute) exacerbation: Principal | ICD-10-CM

## 2020-01-12 NOTE — Unmapped (Signed)
-----   Message from Truett Mainland, MD sent at 01/12/2020 11:29 AM EST -----  Regarding: Sensitivity testing  Can you call the micro lab to get them to run sensitivity testing on his Burkholderia?  ----- Message -----  From: Background User Lab  Sent: 01/09/2020   5:02 PM EST  To: Truett Mainland, MD

## 2020-01-12 NOTE — Unmapped (Signed)
Phoned Micro lab and requested susceptibility of his 4+ Burkholderia gladioli as per Dr. Garner Nash.       They said they would need to send out to [facility] and we should have results back in 2 weeks.

## 2020-01-15 MED FILL — ALBUTEROL SULFATE 2.5 MG/3 ML (0.083 %) SOLUTION FOR NEBULIZATION: 30 days supply | Qty: 360 | Fill #1 | Status: AC

## 2020-01-15 MED FILL — SODIUM CHLORIDE 7 % FOR NEBULIZATION: 90 days supply | Qty: 720 | Fill #0 | Status: AC

## 2020-01-15 MED FILL — ALBUTEROL SULFATE 2.5 MG/3 ML (0.083 %) SOLUTION FOR NEBULIZATION: RESPIRATORY_TRACT | 30 days supply | Qty: 360 | Fill #1

## 2020-01-15 MED FILL — SULFAMETHOXAZOLE 400 MG-TRIMETHOPRIM 80 MG TABLET: 90 days supply | Qty: 180 | Fill #0 | Status: AC

## 2020-01-15 MED FILL — JARDIANCE 25 MG TABLET: ORAL | 90 days supply | Qty: 90 | Fill #1

## 2020-01-15 MED FILL — JARDIANCE 25 MG TABLET: 90 days supply | Qty: 90 | Fill #1 | Status: AC

## 2020-01-19 DIAGNOSIS — R895 Abnormal microbiological findings in specimens from other organs, systems and tissues: Principal | ICD-10-CM

## 2020-01-19 DIAGNOSIS — J471 Bronchiectasis with (acute) exacerbation: Principal | ICD-10-CM

## 2020-01-19 MED ORDER — LEVOFLOXACIN 750 MG TABLET
ORAL_TABLET | Freq: Every day | ORAL | 0 refills | 14.00000 days | Status: CP
Start: 2020-01-19 — End: 2020-02-02

## 2020-01-19 MED ORDER — LEVOFLOXACIN 750 MG TABLET: 750 mg | tablet | Freq: Every day | 0 refills | 14 days | Status: AC

## 2020-01-19 NOTE — Unmapped (Signed)
Phoned [name] to check in on how he was feeling on the Cipro.     Still having coughing spells but the cough is a bit more dry-not as much coughing attacks. Sputum has definitely  decreased in amount.       Overall states that he feels better, though not completely back to normal.     Feels like maybe infection is 'not as bad and a little more tolerable, but still there.     Let him know that I would relay this to Dr. Garner Nash.    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

## 2020-01-19 NOTE — Unmapped (Signed)
Addended by: Arsenio Loader F on: 01/19/2020 01:31 PM     Modules accepted: Orders

## 2020-01-19 NOTE — Unmapped (Signed)
Phoned [name] to let him know that we will stop his Cipro and start him on Levofloxacin x 14 days.     Updated his Pharmacy on record [facility]     Let him know I would phone him early next week to see how he was feeling.     He verbalize understanding of the above.

## 2020-01-19 NOTE — Unmapped (Signed)
Changed antibiotics from Ciprofloxacin to Levofloxacin based on sensitivity testing of Burkholderia.  Sent script to [facility].  He should stop the Cipro and take the Levofloxacin for 2 weeks.

## 2020-01-24 MED FILL — ATORVASTATIN 20 MG TABLET: 90 days supply | Qty: 90 | Fill #3

## 2020-01-24 MED FILL — WATER FOR INJECTION, STERILE INJECTION SOLUTION: 30 days supply | Qty: 600 | Fill #1 | Status: AC

## 2020-01-24 MED FILL — COLISTIN (COLISTIMETHATE SODIUM) 150 MG SOLUTION FOR INJECTION: 30 days supply | Qty: 60 | Fill #1 | Status: AC

## 2020-01-24 MED FILL — BD LUER-LOK SYRINGE 3 ML 20 GAUGE X 1": 30 days supply | Qty: 60 | Fill #1 | Status: AC

## 2020-01-24 MED FILL — BD LUER-LOK SYRINGE 3 ML 20 GAUGE X 1": 30 days supply | Qty: 60 | Fill #1

## 2020-01-24 MED FILL — LC PLUS MISC: 30 days supply | Qty: 3 | Fill #1 | Status: AC

## 2020-01-24 MED FILL — LC PLUS MISC: 30 days supply | Qty: 3 | Fill #1

## 2020-01-24 MED FILL — ATORVASTATIN 20 MG TABLET: 90 days supply | Qty: 90 | Fill #3 | Status: AC

## 2020-01-24 MED FILL — WATER FOR INJECTION, STERILE INJECTION SOLUTION: 30 days supply | Qty: 600 | Fill #1

## 2020-01-24 MED FILL — COLISTIN (COLISTIMETHATE SODIUM) 150 MG SOLUTION FOR INJECTION: 30 days supply | Qty: 60 | Fill #1

## 2020-01-24 NOTE — Unmapped (Signed)
[facility] Refill Coordination Note    Specialty Medication(s) to be Shipped:   CF/Pulmonary: -Colistimethate    Other medication(s) to be shipped: atorvastatin, syringes, nebulizers, sterile water, alcohol swabs     Brett Simpler Sr., DOB: 03-20-1966  Phone: [phone] (home)       All above HIPAA information was verified with patient.     Was a Nurse, learning disability used for this call? No    Completed refill call assessment today to schedule patient's medication shipment from the [facility] [phone]).       Specialty medication(s) and dose(s) confirmed: Regimen is correct and unchanged.   Changes to medications: [name] reports no changes at this time.  Changes to insurance: No  Questions for the pharmacist: No    Confirmed patient received Welcome Packet with first shipment. The patient will receive a drug information handout for each medication shipped and additional FDA Medication Guides as required.       DISEASE/MEDICATION-SPECIFIC INFORMATION        For CF patients: CF [employer]well [employer] Active? No-not enrolled    SPECIALTY MEDICATION ADHERENCE     Medication Adherence    Patient reported X missed doses in the last month: 0  Specialty Medication: Colistimethate  Patient is on additional specialty medications: No  Patient is on more than two specialty medications: No  Any gaps in refill history greater than 2 weeks in the last 3 months: no  Demonstrates understanding of importance of adherence: yes  Informant: patient  Support network for adherence: family member                Colistimethate 150mg : Patient has 0 days of medication on hand      SHIPPING     Shipping address confirmed in Epic.     Delivery Scheduled: Yes, Expected medication delivery date: 3/25.     Medication will be delivered via UPS to the prescription address in Epic WAM.    Olga Millers   [facility] Specialty Technician

## 2020-02-01 MED FILL — METFORMIN ER 500 MG TABLET,EXTENDED RELEASE 24 HR: 90 days supply | Qty: 360 | Fill #1 | Status: AC

## 2020-02-01 MED FILL — CETIRIZINE 10 MG TABLET: 90 days supply | Qty: 90 | Fill #1 | Status: AC

## 2020-02-01 MED FILL — METFORMIN ER 500 MG TABLET,EXTENDED RELEASE 24 HR: ORAL | 90 days supply | Qty: 360 | Fill #1

## 2020-02-01 MED FILL — CETIRIZINE 10 MG TABLET: ORAL | 90 days supply | Qty: 90 | Fill #1

## 2020-02-16 DIAGNOSIS — J479 Bronchiectasis, uncomplicated: Principal | ICD-10-CM

## 2020-02-20 MED FILL — FLUTICASONE PROPIONATE 50 MCG/ACTUATION NASAL SPRAY,SUSPENSION: 30 days supply | Qty: 16 | Fill #0 | Status: AC

## 2020-03-07 MED ORDER — ATORVASTATIN 20 MG TABLET
ORAL_TABLET | 3 refills | 0 days | Status: CP
Start: 2020-03-07 — End: 2021-03-07
  Filled 2020-04-02: qty 30, 30d supply, fill #0

## 2020-03-07 NOTE — Unmapped (Addendum)
[facility] Clinical Assessment  Note    Patient has financial difficulties affording medications:    Was not taking Advair for a while but had some coughing and wheezing so started it back up just recently.  Filling at [employer]  Through [employer] assistance    Jardiance--Patient received 3 months of Jardiance through [employer].  MAPS notes say need to apply for Medicare Low Income Subsidy. They were denied this earlier in the yar and do not qualify for the Medicare Subsidy as they are over the income limit by a couple hundred dollars.  They will need further assistance to obtain Jardiance ($500 for 3 months), after this 3 month supply from [employer] is depleted    Topamax makes his fingers tingle and makes him feel weird so he does not take it often, sometimes at onset of migraine but trying to handle migraines without it with OTC meds. Reviewed copay: $17 for 90 days    Does not take Sertraline regularly either. Mentioned cost may be an issue  Reviewed copay: $17 for 90 days     Brett Simpler Sr., DOB: [birth year]  Phone: [phone] (home)     All above HIPAA information was verified with patient.     Was a Nurse, learning disability used for this call? No    Specialty Medication(s):   CF/Pulmonary: -Nebulized colistin (150mg  vials) and supply kit     Current Outpatient Medications   Medication Sig Dispense Refill   ??? acetaminophen (TYLENOL EXTRA STRENGTH) 500 MG tablet Take 1 tablet by mouth as needed for pain     ??? albuterol 2.5 mg /3 mL (0.083 %) nebulizer solution Inhale the contents of 1 vial (3 mL) via nebulizer twice daily and every 6 hours as needed for shortness of breath and wheezing 360 mL 14   ??? albuterol HFA 90 mcg/actuation inhaler Inhale 2 puffs every four (4) hours as needed for wheezing 18 g 11   ??? alcohol swabs PadM Use as directed with inhaled antibiotics. 100 each 5   ??? atorvastatin (LIPITOR) 20 MG tablet Take 1 tablet (20 mg) by mouth daily 90 tablet 3   ??? aztreonam lysine (CAYSTON) 75 mg/mL Nebu nebulization solution Inhale 1 mL (75 mg total) by nebulization every eight (8) hours. Nebulize every other month after airway clearance using Altera nebulizer (Patient not taking: Reported on 01/09/2020) 84 mL 5   ??? blood-glucose meter kit Check sugars once daily. 1 each 0   ??? cetirizine (ZYRTEC) 10 MG tablet Take 1 tablet (10 mg total) by mouth daily. 90 tablet 3   ??? cholecalciferol, vitamin D3, (VITAMIN D3) 2,000 unit cap Take 1 capsule (2,000 Units total) by mouth daily. 30 capsule prn   ??? colistimethate (COLYMYCIN) 150 mg injection Inject 2mL of sterile water for injection to mix Colistin Vial.Draw up 2mL (150mg ) mixed colistin & add 1mL SWFI for total volume=83mL. Inhale 3mL twice a day,every other month. 60 each 5   ??? empagliflozin (JARDIANCE) 25 mg tablet Take 1 tablet (25 mg total) by mouth daily. 90 tablet 3   ??? fexofenadine (ALLEGRA) 180 MG tablet Take 1 tablet (180 mg total) by mouth daily. 90 tablet 3   ??? fluticasone propion-salmeteroL (ADVAIR HFA) 230-21 mcg/actuation inhaler Inhale 2 puffs Two (2) times a day. 36 g 3   ??? fluticasone propionate (FLONASE) 50 mcg/actuation nasal spray Use 2 sprays in each nostril route daily. 16 g 11   ??? lancets Misc 1 each by Other route  once daily. Test daily before breakfast. Dispense 90 day supply. 100 each 3   ??? metFORMIN (GLUCOPHAGE-XR) 500 MG 24 hr tablet Take 4 tablets (2,000 mg total) by mouth daily with evening meal. 360 tablet 3   ??? MISCELLANEOUS MEDICAL SUPPLY MISC Frequency:PHARMDIR   Dosage:0.0     Instructions:  Note:CPAP 15 cm H20 with heated humidity for nasal dryness.  Mask: ResMed Quattro size Medium. Dx OSA. Dose: 1     ??? montelukast (SINGULAIR) 10 mg tablet Take 1 tablet (10 mg total) by mouth nightly. 90 tablet 3   ??? nebulizers Misc Use with inhaled medication. 3 each 3   ??? omeprazole (PRILOSEC) 20 MG capsule Take 1 capsule (20 mg total) by mouth two (2) times a day. 180 capsule 3   ??? sertraline (ZOLOFT) 50 MG tablet Take 1 tablet by mouth once daily 90 tablet 2   ??? sod bicarb-sod chlor-neti pot (AYR SALINE NASAL NETI RINSE) pkdv Use twice per day     ??? sodium chloride 7% 7 % Nebu Inhale 4 mL by nebulization Two (2) times a day. 720 mL 3   ??? sterile water, PF, Soln Use 2mL to mix colistin, then add 1mL sterile water for injection into neb cup along with colistin.Total volume 3mL 600 mL 5   ??? sulfamethoxazole-trimethoprim (BACTRIM) 400-80 mg per tablet Take 1 tablet (80 mg of trimethoprim total) by mouth Two (2) times a day. 180 tablet 3   ??? syringe with needle (BD LUER-LOK SYRINGE) 3 mL 20 gauge x 1 Syrg Use as directed with inhaled antibiotics. 60 each 5   ??? topiramate (TOPAMAX) 100 MG tablet Take 1 tablet (100 mg total) by mouth nightly. 90 tablet 3     No current facility-administered medications for this visit.        Changes to medications: see notes above    Allergies   Allergen Reactions   ??? Codeine Shortness Of Breath and Rash   ??? Rifampin      Fevers, chills.   ??? Daliresp [Roflumilast] Other (See Comments)     Back pain       Changes to allergies: No    SPECIALTY MEDICATION ADHERENCE     Colistin 150 mg: 0 days of medicine on hand       Medication Adherence    Support network for adherence: family member          Specialty medication(s) dose(s) confirmed: Regimen is correct and unchanged.     Are there any concerns with adherence? No    Adherence counseling provided? Not needed    CLINICAL MANAGEMENT AND INTERVENTION      Clinical Benefit Assessment:    Do you feel the medicine is effective or helping your condition? Yes    Clinical Benefit counseling provided? Not needed    Adverse Effects Assessment:    Are you experiencing any side effects? No    Are you experiencing difficulty administering your medicine? No    Quality of Life Assessment:    How many days over the past month did your bronchiectasis  keep you from your normal activities? For example, brushing your teeth or getting up in the morning. Patient declined to answer Have you discussed this with your provider? Yes    Therapy Appropriateness:    Is therapy appropriate? Yes, therapy is appropriate and should be continued    DISEASE/MEDICATION-SPECIFIC INFORMATION      N/A    PATIENT SPECIFIC NEEDS     -  Does the patient have any physical, cognitive, or cultural barriers? No    - Is the patient high risk? No     - Does the patient require a Care Management Plan? No     - Does the patient require physician intervention or other additional services (i.e. nutrition, smoking cessation, social work)? Yes - MAPS to help afford medications      SHIPPING     Specialty Medication(s) to be Shipped:   No refills needed at this time. Patient denied all meds     Changes to insurance: No    Delivery Scheduled: Patient declined refill at this time due to does not need anything.         The patient will receive a drug information handout for each medication shipped and additional FDA Medication Guides as required.  Verified that patient has previously received a Conservation officer, historic buildings.    All of the patient's questions and concerns have been addressed.    Julianne Rice   [facility] Specialty Pharmacist

## 2020-03-08 NOTE — Unmapped (Signed)
I will discuss with him at his appt with me next week.  Thank you for your assistance.

## 2020-03-11 DIAGNOSIS — J471 Bronchiectasis with (acute) exacerbation: Principal | ICD-10-CM

## 2020-03-22 NOTE — Unmapped (Signed)
[facility] Refill Coordination Note    Specialty Medication(s) to be Shipped:   CF/Pulmonary: -Nebulized colistin (150mg  vials) and supply kit  Other medication(s) to be shipped: Advair 230-68mcg, Albuterol 2.5mg /66ml neb solu, Alcohol prep pads, Atorvastatin 20mg , BD Luer-Lok Syringe 3ml, Fexofenadine 180mg , Fluticasone nasal spray, LC Plus neb cups, Montelukast 10mg , Omeprazole 20mg , Sertraline 50mg , Sterile water, Topiramate 100mg  & Ventolin HFA     Brett Simpler Sr., DOB: 1966-03-17  Phone: [phone] (home)     All above HIPAA information was verified with patient.     Was a Nurse, learning disability used for this call? No    Completed refill call assessment today to schedule patient's medication shipment from the [facility] [phone]).       Specialty medication(s) and dose(s) confirmed: Regimen is correct and unchanged.   Changes to medications: [name] reports no changes at this time.  Changes to insurance: No  Questions for the pharmacist: No    Confirmed patient received Welcome Packet with first shipment. The patient will receive a drug information handout for each medication shipped and additional FDA Medication Guides as required.       DISEASE/MEDICATION-SPECIFIC INFORMATION        For CF patients: CF Healthwell [employer] Active? No-not enrolled    SPECIALTY MEDICATION ADHERENCE     Medication Adherence    Patient reported X missed doses in the last month: 0  Specialty Medication: Colistimethate 150mg   Patient is on additional specialty medications: No  Informant: patient  Reliability of informant: reliable  Support network for adherence: family member        Colistimethate 150 mg: 0 days of medicine on hand (Start 04/02/2020)    SHIPPING     Shipping address confirmed in Epic.     Delivery Scheduled: Yes, Expected medication delivery date: 03/27/2020.     Medication will be delivered via UPS to the prescription address in Epic WAM.    Brett Hanna [facility] Specialty Technician

## 2020-03-26 DIAGNOSIS — J470 Bronchiectasis with acute lower respiratory infection: Principal | ICD-10-CM

## 2020-03-26 MED FILL — VENTOLIN HFA 90 MCG/ACTUATION AEROSOL INHALER: 17 days supply | Qty: 18 | Fill #3 | Status: AC

## 2020-03-26 MED FILL — SERTRALINE 50 MG TABLET: ORAL | 90 days supply | Qty: 90 | Fill #0

## 2020-03-26 MED FILL — FEXOFENADINE 180 MG TABLET: ORAL | 90 days supply | Qty: 90 | Fill #1

## 2020-03-26 MED FILL — FLUTICASONE PROPIONATE 50 MCG/ACTUATION NASAL SPRAY,SUSPENSION: NASAL | 30 days supply | Qty: 16 | Fill #1

## 2020-03-26 MED FILL — FEXOFENADINE 180 MG TABLET: 90 days supply | Qty: 90 | Fill #1 | Status: AC

## 2020-03-26 MED FILL — FLUTICASONE PROPIONATE 50 MCG/ACTUATION NASAL SPRAY,SUSPENSION: 30 days supply | Qty: 16 | Fill #1 | Status: AC

## 2020-03-26 MED FILL — VENTOLIN HFA 90 MCG/ACTUATION AEROSOL INHALER: 17 days supply | Qty: 18 | Fill #3

## 2020-03-26 MED FILL — MONTELUKAST 10 MG TABLET: 90 days supply | Qty: 90 | Fill #0 | Status: AC

## 2020-03-26 MED FILL — TOPIRAMATE 100 MG TABLET: 90 days supply | Qty: 90 | Fill #0 | Status: AC

## 2020-03-26 MED FILL — OMEPRAZOLE 20 MG CAPSULE,DELAYED RELEASE: ORAL | 90 days supply | Qty: 180 | Fill #2

## 2020-03-26 MED FILL — ALBUTEROL SULFATE 2.5 MG/3 ML (0.083 %) SOLUTION FOR NEBULIZATION: 30 days supply | Qty: 360 | Fill #2 | Status: AC

## 2020-03-26 MED FILL — ALBUTEROL SULFATE 2.5 MG/3 ML (0.083 %) SOLUTION FOR NEBULIZATION: RESPIRATORY_TRACT | 30 days supply | Qty: 360 | Fill #2

## 2020-03-26 MED FILL — OMEPRAZOLE 20 MG CAPSULE,DELAYED RELEASE: 90 days supply | Qty: 180 | Fill #2 | Status: AC

## 2020-03-26 MED FILL — TOPIRAMATE 100 MG TABLET: ORAL | 90 days supply | Qty: 90 | Fill #0

## 2020-03-26 MED FILL — SERTRALINE 50 MG TABLET: 90 days supply | Qty: 90 | Fill #0 | Status: AC

## 2020-03-26 NOTE — Unmapped (Signed)
Brett Fogo Starrett Sr. 's colistimethate  shipment will be delayed as a result of a high copay.     I have reached out to the patient and communicated the delay. We will call the patient back to reschedule the delivery upon resolution. We have not confirmed the new delivery date.

## 2020-03-26 NOTE — Unmapped (Signed)
03/26/20 Referral closed for Colistemethate by MAPs with note: (Tier exception is still approved but patient is in Medicare Coverage gap. Patient must spend $5810.14 to get out of donut hole.) ; Copay $227.08. Tried to call patient, no answer and no vm. KFR

## 2020-03-27 NOTE — Unmapped (Signed)
I would recommend he come in to be seen. Can he come to [facility] tomorrow?

## 2020-03-27 NOTE — Unmapped (Incomplete)
Symptom Complaints    ??? Caller:  Brett Hanna  ??? agency)  o Describe the symptom(s): Patient had two ticks off one had red ring around area tick was removed   o How severe is it?:very severe  o When did you/the patient first notice the symptom(s)?: Yesterday  ??? Has the patient been seen by a physician for the same symptom(s) before?: No:   o Do you/does the patient want to be seen by a physician for this issue?: If Provider recommends  o Do you/the patient feel it is an urgent issue?:Yes  o Return call to Donna(wife)  o Call back number: [phone]  ??? Best time to call back: anytime  Last appt with PCP: 12/12/2019

## 2020-03-28 NOTE — Unmapped (Signed)
The Internal Medicine triage team was unable to reach [facility] to follow-up in regards to call concerning tick bites. an appt at the [facility] is advised.. A voicemail was left with a request to return our call. This is our second attempt to contact patient.  Also returned call to wife, Lupita Leash.

## 2020-03-28 NOTE — Unmapped (Signed)
Patient agreed to [facility] appointment today , confirmed. Closing the loop

## 2020-03-28 NOTE — Unmapped (Addendum)
3rd attempt to call [phone] - unable to LVMM - to offer an appt.    Sent a message in MyChart.

## 2020-03-29 NOTE — Unmapped (Signed)
error 

## 2020-03-29 NOTE — Unmapped (Signed)
Patient scheduled and confirmed , later called back to cancel

## 2020-04-02 ENCOUNTER — Ambulatory Visit
Admit: 2020-04-02 | Discharge: 2020-04-03 | Payer: PRIVATE HEALTH INSURANCE | Attending: Student in an Organized Health Care Education/Training Program | Primary: Student in an Organized Health Care Education/Training Program

## 2020-04-02 ENCOUNTER — Ambulatory Visit: Admit: 2020-04-02 | Discharge: 2020-04-02 | Payer: PRIVATE HEALTH INSURANCE

## 2020-04-02 ENCOUNTER — Ambulatory Visit
Admit: 2020-04-02 | Discharge: 2020-04-03 | Payer: PRIVATE HEALTH INSURANCE | Attending: Internal Medicine | Primary: Internal Medicine

## 2020-04-02 DIAGNOSIS — W57XXXA Bitten or stung by nonvenomous insect and other nonvenomous arthropods, initial encounter: Principal | ICD-10-CM

## 2020-04-02 MED FILL — LC PLUS MISC: 30 days supply | Qty: 3 | Fill #2

## 2020-04-02 MED FILL — COLISTIN (COLISTIMETHATE SODIUM) 150 MG SOLUTION FOR INJECTION: 30 days supply | Qty: 60 | Fill #2 | Status: AC

## 2020-04-02 MED FILL — BD LUER-LOK SYRINGE 3 ML 20 GAUGE X 1": 30 days supply | Qty: 60 | Fill #2

## 2020-04-02 MED FILL — ATORVASTATIN 20 MG TABLET: 30 days supply | Qty: 30 | Fill #0 | Status: AC

## 2020-04-02 MED FILL — BD LUER-LOK SYRINGE 3 ML 20 GAUGE X 1": 30 days supply | Qty: 60 | Fill #2 | Status: AC

## 2020-04-02 MED FILL — ALCOHOL PREP PADS: 100 days supply | Qty: 100 | Fill #1 | Status: AC

## 2020-04-02 MED FILL — WATER FOR INJECTION, STERILE INJECTION SOLUTION: 30 days supply | Qty: 600 | Fill #2 | Status: AC

## 2020-04-02 MED FILL — LC PLUS MISC: 30 days supply | Qty: 3 | Fill #2 | Status: AC

## 2020-04-02 MED FILL — WATER FOR INJECTION, STERILE INJECTION SOLUTION: 30 days supply | Qty: 600 | Fill #2

## 2020-04-02 MED FILL — COLISTIN (COLISTIMETHATE SODIUM) 150 MG SOLUTION FOR INJECTION: 30 days supply | Qty: 60 | Fill #2

## 2020-04-02 MED FILL — ALCOHOL PREP PADS: 100 days supply | Qty: 100 | Fill #1

## 2020-04-02 NOTE — Unmapped (Signed)
Brett Hanna    04/02/20    2:29 PM    Assessment:      Patient:Brett [name] Grist Sr. (Apr 01, 1966)    Brett Hanna is a 54 y.o. male who is seen for follow-up of bronchiectasis secondary to alpha-1 antitrypsin carrier state with prior NTM infection status post treatment, OSA, and Burkholderia gladioli colonization.       Plan:      Problem List Items Addressed This Visit        Respiratory    Bronchiectasis without complication (CMS-HCC) - Primary    Uncomplicated severe persistent asthma       Other    Alpha-1-antitrypsin deficiency carrier    Burkholderia gladioli culture positive        Patient did not need to meet with the respiratory therapist today. No changes were made to airway clearance regimen.  No changes were made to inhaled medications.    Patient was unable to expectorate sputum today to be sent for bacterial and AFB cultures.  He will drop off a sample soon.  If he is feeling worse and growing Burkholderia, will treat with Levaquin for 4 weeks.  If worse and not growing Burkholderia, will get chest CT to get better sense of what is going on and need for bronchoscopy.    If he has future exacerbations, sputum should be sent for bacterial and AFB cultures. The bacterial culture should be processed like a cystic fibrosis sample given the overlapping pathogens. There are standing orders for these cultures in our system.    Brett Hanna will return to clinic in 3 months for follow-up with sputum cultures.  He will call or send me a message via MyChart if questions or concerns arise before this visit.     The above plan was discussed with the patient and he is in agreement.       Subjective:      HPI: Brett Hanna is a 54 y.o. male who is seen for follow-up of bronchiectasis secondary to alpha-1 antitrypsin carrier state with prior NTM infection status post treatment, OSA, and Burkholderia gladioli colonization.    09/05/19:  I last saw him in clinic on 11/01/18. He reports having a terrible time with wheezing. Having more frequent coughing spells with more sputum volume. Better for few hours and then returns. If leans over to tie shoes, starts coughing and chokes on volume of mucus. Then can cough up blood during those spells. Describes hemoptysis as blood mixed with sputum (more than streaks but not frank blood). Can get up in morning and be okay some days. Other days gets coughing spells and then gets headache and can't do anything. Started Cipro yesterday; has taken two doses so far and thinks already helping this morning. Didn't feel like response to Augmentin was sustained.  Supposed to be starting Cayston today but is unsure if he has new supply (has to check with wife). Has another day worth of Colistin.  Tightness over RUQ comes and goes. If bends towards right, gets spasm. Supposed to get flu shot on Friday.  Has questions about whether face shield is just as effective as wearing a mask to prevent COVID infection. Has trouble tolerating wearing a mask while seated - has back pain.    01/09/20:  At his last visit, I treated him with Cipro for 2 weeks for newly cultured Burkholderia gladioli.  He feels like it helps somewhat but did not completely resolve his symptoms.  Subsequent  culture in December was positive for B gladioli.  He had COVID-19 diagnosed 10/23/19 and received a monoclonal antibody therapy.  He tells me today that he does not think that the therapy was very helpful to him; his wife disagrees.  Unfortunately, Brett Hanna has been off of his inhaled antibiotics due to high co-pay.  He feels that this is worsened his cough and breathing.  He is now having coughing attacks and notes more chest discomfort when coughing.  The coughing attacks started prior to discontinuation of inhaled antibiotics but have now worsened.  He is expectorating brown and somewhat bloody sputum although he is unsure if the blood in the sputum is coming from his sinuses since he has experienced more frequent nosebleeds recently.  He tolerated the Cipro very well aside from having some muscle stiffness in the morning.  Has been losing weight despite his appetite remaining robust.  He and his wife attribute this to his Jardiance.    04/02/20:  Sputum a little better but still there. Breathing a bit better. Over past 10 days, noticed a bit more.  Sputum is tan with rusty and green.  Not frank blood.  Yesterday, had coughing and wheezing all day.  Today is better.  Sputum comes up every time bends over.  Coughing spells not as bad or consistent as prior. When cough is looser, has raspy wheeze.  On Cayston right now. Awaiting Colistin. No F/C/NS. Just got new CPAP mask.  Walking for exercise - throughout the day.    Past Medical History:   Diagnosis Date   ??? Allergic rhinitis    ??? Alpha-1-antitrypsin deficiency carrier     with slightly low levels   ??? Amblyopia    ??? Asthma    ??? Asthma, extrinsic    ??? Bronchiectasis (CMS-HCC) 06/12/2011   ??? Chronic sinusitis    ??? Colon polyps    ??? COVID-19 10/24/2019   ??? Diabetes mellitus (CMS-HCC)    ??? GERD (gastroesophageal reflux disease)    ??? H/O Mycobacterium avium complex infection    ??? Impaired glucose tolerance    ??? Obesity    ??? Pneumonia    ??? Pseudomonas respiratory infection    ??? 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 Sleep apnea    ??? Strabismus    ??? Strep throat        Past Surgical History:   Procedure Laterality Date   ??? BRONCHOSCOPY     ??? LUNG BIOPSY     ??? PR BRONCHOSCOPY,DIAGNOSTIC W BRUSH Bilateral 08/21/2013    Procedure: BRONCHOSCOPY, RIGID OR FLEXIBLE, INCLUDING FLOURO GUIDED; DIAGNOSTIC, WITH BRUSHING OR PROTECTED BRUSHINGS;  Surgeon: Nash Dimmer, MD;  [facility]: [facility];  Service: Pulmonary   ??? PR BRONCHOSCOPY,DIAGNOSTIC W LAVAGE Bilateral 08/21/2013    Procedure: BRONCHOSCOPY, RIGID OR FLEXIBLE, INCLUDE FLUOROSCOPIC GUIDANCE WHEN PERFORMED; W/BRONCHIAL ALVEOLAR LAVAGE;  Surgeon: Nash Dimmer, MD;  Location: [facility] PROCED[facility] LAB [facility];  Service: Pulmonary   ??? PR COLONOSCOPY FLX DX W/COLLJ SPEC WHEN PFRMD N/A 03/21/2015    Procedure: COLONOSCOPY, FLEXIBLE, PROXIMAL TO SPLENIC FLEXURE; DIAGNOSTIC, W/WO COLLECTION SPECIMEN BY BRUSH OR WASH;  Surgeon: Brendia Sacks, MD;  Location: [facility];  Service: Gastroenterology   ??? SHOULDER SURGERY Right        Family History   Problem Relation Age of Onset   ??? Colon cancer Father    ??? Emphysema Maternal Uncle    ??? Heart disease Maternal Uncle    ???  Diabetes Paternal Grandfather        Social History     Tobacco Use   ??? Smoking status: Never Smoker   ??? Smokeless tobacco: Former Estate agent Use Topics   ??? Alcohol use: No     Alcohol/week: 0.0 standard drinks   ??? Drug use: No       Allergies  Reviewed at 1:25 PM      Reactions Comment    Codeine Shortness Of Breath, Rash     Rifampin  Fevers, chills.    Daliresp [roflumilast] Other (See Comments) Back pain          Current Outpatient Medications   Medication Sig Dispense Refill   ??? albuterol 2.5 mg /3 mL (0.083 %) nebulizer solution Inhale the contents of 1 vial (3 mL) via nebulizer twice daily and every 6 hours as needed for shortness of breath and wheezing 360 mL 14   ??? albuterol HFA 90 mcg/actuation inhaler Inhale 2 puffs every four (4) hours as needed for wheezing 18 g 11   ??? atorvastatin (LIPITOR) 20 MG tablet Take 1 tablet (20 mg total) by mouth daily. 30 tablet 11   ??? aztreonam lysine (CAYSTON) 75 mg/mL Nebu nebulization solution Inhale 1 mL (75 mg total) by nebulization every eight (8) hours. Nebulize every other month after airway clearance using Altera nebulizer 84 mL 5   ??? cetirizine (ZYRTEC) 10 MG tablet Take 1 tablet (10 mg total) by mouth daily. 90 tablet 3   ??? cholecalciferol, vitamin D3, (VITAMIN D3) 2,000 unit cap Take 1 capsule (2,000 Units total) by mouth daily. 30 capsule prn   ??? empagliflozin (JARDIANCE) 25 mg tablet Take 1 tablet (25 mg total) by mouth daily. 90 tablet 3   ??? fexofenadine (ALLEGRA) 180 MG tablet Take 1 tablet (180 mg total) by mouth daily. (Patient taking differently: Take 180 mg by mouth daily. Alternates with zyrtec) 90 tablet 3   ??? fluticasone propion-salmeteroL (ADVAIR HFA) 230-21 mcg/actuation inhaler Inhale 2 puffs Two (2) times a day. (Patient taking differently: Inhale 2 puffs Two (2) times a day. Filling at [facility] 03/07/20) 36 g 3   ??? fluticasone propionate (FLONASE) 50 mcg/actuation nasal spray Use 2 sprays in each nostril route daily. 16 g 11   ??? metFORMIN (GLUCOPHAGE-XR) 500 MG 24 hr tablet Take 4 tablets (2,000 mg total) by mouth daily with evening meal. 360 tablet 3   ??? montelukast (SINGULAIR) 10 mg tablet Take 1 tablet (10 mg total) by mouth nightly. 90 tablet 3   ??? omeprazole (PRILOSEC) 20 MG capsule Take 1 capsule (20 mg total) by mouth two (2) times a day. (Patient taking differently: Take 20 mg by mouth two (2) times a day. Takes 2 caps AM) 180 capsule 3   ??? sodium chloride 7% 7 % Nebu Inhale 4 mL by nebulization Two (2) times a day. 720 mL 3   ??? sulfamethoxazole-trimethoprim (BACTRIM) 400-80 mg per tablet Take 1 tablet (80 mg of trimethoprim total) by mouth Two (2) times a day. 180 tablet 3   ??? topiramate (TOPAMAX) 100 MG tablet Take 1 tablet (100 mg total) by mouth nightly. 90 tablet 3   ??? acetaminophen (TYLENOL EXTRA STRENGTH) 500 MG tablet Take 1 tablet by mouth as needed for pain     ??? alcohol swabs PadM Use as directed with inhaled antibiotics. 100 each 5   ??? blood-glucose meter kit Check sugars once daily. 1 each 0   ??? colistimethate (COLYMYCIN) 150  mg injection Inject 2mL of sterile water for injection to mix Colistin Vial.Draw up 2mL (150mg ) mixed colistin & add 1mL SWFI for total volume=108mL. Inhale 3mL twice a day,every other month. 60 each 5   ??? lancets Misc 1 each by Other route once daily. Test daily before breakfast. Dispense 90 day supply. 100 each 3   ??? MISCELLANEOUS MEDICAL SUPPLY MISC Frequency:PHARMDIR   Dosage:0.0     Instructions:  Note:CPAP 15 cm H20 with heated humidity for nasal dryness.  Mask: ResMed Quattro size Medium. Dx OSA. Dose: 1     ??? nebulizers Misc Use with inhaled medication. 3 each 3   ??? sertraline (ZOLOFT) 50 MG tablet Take 1 tablet by mouth once daily 90 tablet 2   ??? sod bicarb-sod chlor-neti pot (AYR SALINE NASAL NETI RINSE) pkdv Use twice per day     ??? sterile water, PF, Soln Use 2mL to mix colistin, then add 1mL sterile water for injection into neb cup along with colistin.Total volume 3mL 600 mL 5   ??? syringe with needle (BD LUER-LOK SYRINGE) 3 mL 20 gauge x 1 Syrg Use as directed with inhaled antibiotics. 60 each 5     No current facility-administered medications for this visit.     Physical Exam:  BP 134/71  - Pulse 76  - Temp 36.7 ??C (98.1 ??F) (Temporal)  - Resp 28  - Ht 167.6 cm (5' 6)  - Wt 82.1 kg (181 lb)  - SpO2 94% Comment: room air - BMI 29.21 kg/m??   Obese white male appearing comfortable and in no acute distress.  Easy work of breathing without accessory muscle use.  Coughing after taking deep breaths.  Crackles scattered throughout lung fields.  Speaking in full sentences easily.  Regular rate and rhythm with normal S1 and S2.  No murmurs, rubs, or gallops.      Diagnostic Review:   The following data were reviewed during this visit with key findings summarized below:    Pulmonary Function Testing:    ?? FVC (% predicted) FEV1 (% predicted) FEV1/FVC   05/12/13 3.48 L (80%) 2.92 L (85%) 84%   06/26/14 2.84 L (65%) 2.29 L (67%) 81%   03/25/17 2.95 L (67%) 2.44 L (71%) 83%   06/29/17 2.68 L (62%) 2.34 L (70%) 87%   11/23/17 2.62 L (61%) 2.20 L (66%) 84%   03/08/18 2.59 L (60%) 2.06 L (62%) 80%   06/21/18 2.76 L (64%) 2.04 L (62%) 74%   11/01/18 2.63 L (61%) 2.08 L (63%) 79%   01/09/20 2.29 L (55%) 1.84 L (55%) 80%   04/02/20 2.27 L (54%) 1.91 L (58%) 84%   Measures obtained today are suggestive of moderate restriction and are worse as compared to 11/01/2018.    Cultures:  ?? Source Bacterial Culture AFB Smear AFB Culture   03/25/17 Sputum 3+ OPF; 3+ Stenotrophomonas negative negative   06/29/17 Sputum OPF negative Overgrown with bacteria/fungus   01/28/18 Sputum 2+ OPF; 2+ GNR glucose non-fermenter; Scedosporium negative 2 morphologies Streptomyces   02/01/18 Sputum 3+ OPF; 1+ GNR glucose non-fermenter; Scedosporium negative Streptomyces   03/02/18 Sputum 3+ OPF; 3+ GNR glucose non-fermenter; Scedosporium negative Streptomyces   08/07/19 Sputum 3+ OPF; 3+ Burkholderia gladioli negative Negative   10/17/19 Sputum 2+ OPF; 4+ Burkholderia gladioli negative Overgrown   01/09/20 Sputum, 2+ OPF; 4+ Burkholderia gladioli negative Overgrown   ??  Imaging  Chest CT (03/18/18): Images personally reviewed. Bronchial wall thickening and bronchiectasis primarily involving the airways  supplying the lingula and lung bases with associated clustered micronodular and tree-in-bud opacities. With respect to 02/02/2018, there is increased opacification at the deep posterior costophrenic recesses with more volume loss particularly at the left base, favored to represent atelectasis.  ??  Chest CT (02/02/18): Images personally reviewed. Bronchiectasis predominantly in bases of lingula, right middle lobe, and bilateral lower lobes (left greater than right), mild to moderately progressed since 03/30/2017. ?? New and increased nodular and tree-in-bud opacities, now involving all lobes. Patchy consolidations in the bases of the lingula, and lower lobes also stable to mildly increased. Also new left upper lobe nodular opacity with adjacent groundglass, 8 mm, (2:26). No definite reticular opacities, or honeycombing identified. No appreciable air trapping on expiratory imaging.  ??  Chest CT (03/30/17): Images personally reviewed. Interval worsening of bilateral lower lobe bronchiectasis and bronchial wall thickening with new lingular bronchiectasis. Increased clustered groundglass opacities with areas of consolidation in the lower lobes and lingula. Findings may represent sequela of endobronchial infection or impaired mucus clearance.  Bronchiectasis has been present on CT imaging since 06/12/11.    Portions of this record have been created using Scientist, clinical (histocompatibility and immunogenetics). Dictation errors have been sought, but may not have been identified and corrected.    Viona Gilmore, MD  04/02/20  2:29 PM

## 2020-04-02 NOTE — Unmapped (Signed)
Brett Fogo Marlar Sr. 's COLISTIMETHATE shipment will be sent out  as a result of copay is now approved by patient/caregiver.      I have reached out to the patient and communicated the delivery change. We will reschedule the medication for the delivery date that the patient agreed upon.  We have confirmed the delivery date as 04/03/20, via ups.

## 2020-04-02 NOTE — Unmapped (Signed)
INTERNAL MEDICINE [facility]VANCED SAME DAY CLINIC NOTE     04/02/2020    PCP: Danielle Dess, MD     Assessment and Plan    Brett Hanna was seen today for other.    Diagnoses and all orders for this visit:    Tick bite, initial encounter      Tick bite: reports 2 tick bites present on R hip and left lower extremity in which his wife removed along with the head after only couple of hours. Pt denies any rashes, fevers, headaches or systemic symptoms. Does have a history of frequent tick bites, however has never required treatment in the past. Due to short time course of tick attachment and removal, do not feel antibiotics are warranted at this time. Given lack of any other symptoms also will defer any testing for tick-borne antibodies. Advised patient to monitor for any skin changes or infectious symptoms and to come back and see [place] if any changes occur.     Follow up as scheduled or sooner as needed.    Patient was seen and discussed with Dr. Johnsie Kindred who is in agreement with the assessment and plan as outlined above.       Subjective    Problem List:  Patient Active Problem List   Diagnosis   ??? Allergic rhinitis   ??? Alpha-1-antitrypsin deficiency carrier   ??? Benign neoplasm of colon   ??? Bronchiectasis (CMS-HCC)   ??? Hyperlipidemia   ??? Obstructive sleep apnea syndrome   ??? Chronic sinusitis   ??? Vitamin D deficiency   ??? Type 2 diabetes mellitus without complication, without long-term current use of insulin (CMS-HCC)   ??? Uncomplicated severe persistent asthma   ??? Overweight   ??? Migraine with aura and without status migrainosus, not intractable   ??? Gastroesophageal reflux disease without esophagitis   ??? Flexural eczema   ??? Anxiety   ??? Fatty liver   ??? Cough   ??? Burkholderia gladioli culture positive   ??? COVID-19       HPI  Brett Simpler Sr. is a 54 y.o. year old male with the above problem list who presents to the same day clinic for   Chief Complaint   Patient presents with   ??? Other     Tick bite - Left leg and Right hip Patient states he noticed two ticks present on his left lower leg and right hip on Thursday, 5/27. Wife removed them that same day, along with the head. [place]. He reports getting frequent tick bites in the past, last one roughly 4 years ago. Has never required any treatment. Wife wanted him to come to clinic because she was worried about Lyme disease. Patient denies any fevers, chills, headaches, or muscle aches.    Meds and allergies were reviewed in Epic    ROS: 10 point ROS was performed and is otherwise negative other than mentioned in the HPI    Objective  PE:  Vitals:    04/02/20 1049   BP: 132/83   Pulse: 67   Temp: 36.7 ??C   SpO2: 96%     General: well-appearing in NAD  CV: regular, no murmurs  Resp: normal WOB  GI: soft, NTND, NABS  Skin: clean and dry. Erythematous slightly raised lesion present on right hip roughly 3 cm in diameter. Smaller punctate lesion noted on left lower leg.   Ext: no cyanosis/clubbing/edema  Neuro: alert,  follows commands    Procedure: None  See procedure note from this encounter    Metric Tracker:  Did today's visit result in referral to ED or direct admission? No

## 2020-04-02 NOTE — Unmapped (Signed)
Would like to get another sputum culture.  If you are feeling worse and growing Burkholderia, will treat with Levaquin for 4 weeks.    If worse and not growing Burkholderia, will get chest CT.    Symptoms of a bronchiectasis exacerbation: 48 hours of at least three of the following:  ?? Increased cough  ?? Change in volume or appearance of sputum  ?? Increased sputum purulence  ?? Worsening shortness of breath and/or exercise tolerance  ?? Fatigue and/or malaise  ?? Coughing up blood (hemoptysis)    If you are experiencing some of these symptoms:  ?? Increase the frequency and/or intensity of your airway clearance  ?? Try to submit a sputum sample for bacterial and AFB cultures (at [facility] or locally)  ?? Reach out to me or your local physician.  ?? If you need antibiotics, recommend treating for 14 days.      Please bring any new airway clearance equipment to your next visit to ensure that you are using and caring for it properly.    Information about the COVID-19 vaccines: yourshot.org    Interested in participating in COVID-19 vaccine trials? https://www.coronaviruspreventionnetwork.org    Thank you for allowing me to be a part of your care. Please call the clinic with any questions.    Viona Gilmore, MD, MPH  Pulmonary and Critical Care Medicine  [address]  [id number]  [place], [place] [address]    Thank you for your visit to the [facility] Pulmonary Clinics. You may receive a survey from [facility] regarding your visit today, and we are eager to use this feedback to improve your experience. Thank you for taking the time to fill it out.    Between appointments, you can reach [place] at these numbers:  ??? For appointments: [phone]  ??? For my nurse, Arsenio Loader RN: [phone]  ??? Fax: [phone]  ??? For urgent issues after hours: Hospital Operator @ [phone] & ask for Pulmonary Fellow on call    For further information, check out the websites below:    [facility] for Bronchiectasis: [email or web address]    Impact Airway Clearance Education: http://www.impact-be.com    Videos from the patient session of the World Bronchiectasis Conference in [place] on May 15, 2017 can be watched here (TrustyNews.es).     Bronchiectasis Toolbox (information about airway clearance): DiningCalendar.de    Copy for Individuals with Bronchiectasis and/or NTM through the Bronchiectasis Registry and the COPD Foundation: https://www.mckee-young.org/    Information about Non-tuberculous Mycobacterial Infections:  https://www.ntminfo.org     Interested in clinical trials and other research opportunities?  www.clinicaltrials.gov    The Rare Diseases Clinical Research Network [facility]) Genetic Disorders of  Mucociliary Clearance Consortium [facility]) Contact Registry is a way for patients with disorders of mucociliary clearance (such as bronchiectasis) and their family members to learn about research studies they may be able to join. Participation is completely voluntary and you may choose to withdraw at any time. There is no cost to join the [employer]. For more information or to join the registry please go to the following website:  [email or web address]

## 2020-04-02 NOTE — Unmapped (Signed)
Thank you for coming to see Brett Hanna today in clinic. Please come to see Brett Hanna if you notice any new rashes, fevers, chills, muscle aches or headaches.

## 2020-04-04 NOTE — Unmapped (Signed)
Addended by: Jeffie Pollock F on: 04/04/2020 09:39 AM     Modules accepted: Level of Service

## 2020-04-08 DIAGNOSIS — J471 Bronchiectasis with (acute) exacerbation: Principal | ICD-10-CM

## 2020-04-12 NOTE — Unmapped (Signed)
[facility] Refill Coordination Note    Specialty Medication(s) to be Shipped:   CF/Pulmonary: -None: Denied at this time due to having enough meds on hand  Other medication(s) to be shipped: Fluticasone nasal spray , Sodium Chloride 7% neb solu & Sulfa-Trime 400-80mg      Brett Simpler Sr., DOB: 1966-09-06  Phone: [phone] (home)     All above HIPAA information was verified with patient.     Was a Nurse, learning disability used for this call? No    Completed refill call assessment today to schedule patient's medication shipment from the [facility] [phone]).       Specialty medication(s) and dose(s) confirmed: Regimen is correct and unchanged.   Changes to medications: Brett Hanna reports no changes at this time.  Changes to insurance: No  Questions for the pharmacist: No    Confirmed patient received Welcome Packet with first shipment. The patient will receive a drug information handout for each medication shipped and additional FDA Medication Guides as required.       DISEASE/MEDICATION-SPECIFIC INFORMATION        For CF patients: CF [facility] Active? No-not enrolled    SPECIALTY MEDICATION ADHERENCE     Medication Adherence    Informant: patient  Reliability of informant: reliable  Support network for adherence: family member        SHIPPING     Shipping address confirmed in Epic.     Delivery Scheduled: Yes, Expected medication delivery date: 04/17/2020.     Medication will be delivered via UPS to the prescription address in Epic WAM.    Brett Hanna Shared [facility] Specialty Technician

## 2020-04-16 MED FILL — SODIUM CHLORIDE 7 % FOR NEBULIZATION: 90 days supply | Qty: 720 | Fill #1 | Status: AC

## 2020-04-16 MED FILL — SODIUM CHLORIDE 7 % FOR NEBULIZATION: RESPIRATORY_TRACT | 90 days supply | Qty: 720 | Fill #1

## 2020-04-16 MED FILL — SULFAMETHOXAZOLE 400 MG-TRIMETHOPRIM 80 MG TABLET: ORAL | 90 days supply | Qty: 180 | Fill #1

## 2020-04-16 MED FILL — SULFAMETHOXAZOLE 400 MG-TRIMETHOPRIM 80 MG TABLET: 90 days supply | Qty: 180 | Fill #1 | Status: AC

## 2020-04-17 MED FILL — FLUTICASONE PROPIONATE 50 MCG/ACTUATION NASAL SPRAY,SUSPENSION: 30 days supply | Qty: 16 | Fill #2 | Status: AC

## 2020-04-17 MED FILL — FLUTICASONE PROPIONATE 50 MCG/ACTUATION NASAL SPRAY,SUSPENSION: NASAL | 30 days supply | Qty: 16 | Fill #2

## 2020-04-25 ENCOUNTER — Encounter: Admit: 2020-04-25 | Discharge: 2020-04-25 | Payer: PRIVATE HEALTH INSURANCE

## 2020-04-25 ENCOUNTER — Ambulatory Visit: Admit: 2020-04-25 | Discharge: 2020-04-25 | Payer: PRIVATE HEALTH INSURANCE

## 2020-04-25 DIAGNOSIS — J479 Bronchiectasis, uncomplicated: Principal | ICD-10-CM

## 2020-04-25 DIAGNOSIS — E119 Type 2 diabetes mellitus without complications: Principal | ICD-10-CM

## 2020-04-25 DIAGNOSIS — E8801 Alpha-1-antitrypsin deficiency: Principal | ICD-10-CM

## 2020-04-25 MED ORDER — TOPIRAMATE 100 MG TABLET
ORAL_TABLET | Freq: Every evening | ORAL | 3 refills | 180 days
Start: 2020-04-25 — End: 2021-04-25

## 2020-04-25 NOTE — Unmapped (Unsigned)
Assessment and Plan    1. Alpha-1-antitrypsin deficiency (CMS-HCC)        #1. ***    #2. ***    #3. ***    #4. ***    No orders of the defined types were placed in this encounter.        Follow up: No follow-ups on file.        Reason for visit: Follow-up  , ***    Brett Hanna Sr. is a 54 y.o. male with *** who presents for follow up.    HPI  ***    Medications were reviewed    Physical Exam  Gen***  Vitals:    04/25/20 1425   BP: 115/67   Pulse: 77   Resp: 18   Temp: 36.7 ??C (98 ??F)   TempSrc: Tympanic   SpO2: 95%   Weight: 81.8 kg (180 lb 6.4 oz)   Height: 170.2 cm (5' 7)     CV: RRR, no mrg  Pulm: CTAB  Ext: no edema  ***    PHQ-9 Score:  PHQ-9 TOTAL SCORE: 0  GAD-7 Score:  GAD-7 Total Score: 0        Medication adherence and barriers to the treatment plan have been addressed. Opportunities to optimize healthy behaviors have been discussed. Patient / caregiver voiced understanding.

## 2020-04-25 NOTE — Unmapped (Signed)
Sputum specimen collected for AFB and Lower respiratory culture, delivered to lab via tube system.

## 2020-04-25 NOTE — Unmapped (Signed)
Assessment and Plan    1. Bronchiectasis without complication (CMS-HCC)    2. Alpha-1-antitrypsin deficiency (CMS-HCC)    3. Type 2 diabetes mellitus without complication, without long-term current use of insulin (CMS-HCC)    4. Anxiety    5. Migraine with aura and without status migrainosus, not intractable        #1. Bronchiectasis/alpha1 antitripsin carrier. Follows closely with pulm/bronchiectasis clinic. Currently some financial issue with the medications. They are working on seeing what he can qualify for.    #2. DM2. A1C today 7.1. Tolerating addition of Jardiance, although concern that there are affordability issues. He says this is being worked on right now and he'll let me know if our social workers need to assist.    #3. Migraines. The topamax 100mg  was causing parasthesias so he decreased it to 50mg  daily and those are improved. Agree with decreasing the dose. We discussed potentially down the road switching topamax and his SSRI to effexor to decrease pill burden and help with anxiety and migraine prevention. Since he just picked up a bottle of topamax, he'd prefer not to switch at this time.     #4. HM. Counseled at length re: COVID vaccination. Answered his questions. He decided to get vaccinated today. Moderna 1st shot given.    No orders of the defined types were placed in this encounter.        Follow up: Return in about 3 months (around 07/26/2020), or if symptoms worsen or fail to improve.        Reason for visit: Follow-up  , bronchiectasis, DM    Brett Simpler Sr. is a 54 y.o. male who presents for follow up.    HPI  Gave sputum today for pulm.    Topamax makes fingers tingle.  Has a migraine today. Took tylenol  1/2 dose of the topamax isn't as bad  Just picked up a 90 day supply  Doesn't take the sertraline all the time.     There are some issues in terms of cost of his meds. Sounds like he's in the donut hole. Says pulmonology is working on what programs he might qualify for. Medications were reviewed    Physical Exam  Gen: no acute distress  Vitals:    04/25/20 1425   BP: 115/67   Pulse: 77   Resp: 18   Temp: 36.7 ??C (98 ??F)   TempSrc: Tympanic   SpO2: 95%   Weight: 81.8 kg (180 lb 6.4 oz)   Height: 170.2 cm (5' 7)       PHQ-9 Score:  PHQ-9 TOTAL SCORE: 0  GAD-7 Score:  GAD-7 Total Score: 0    Reviewed pulm notes  Reviewed SDC note re: tick bite    Medication adherence and barriers to the treatment plan have been addressed. Opportunities to optimize healthy behaviors have been discussed. Patient / caregiver voiced understanding.

## 2020-04-25 NOTE — Unmapped (Addendum)
Your A1C is 7.1  I do recommend having the COVID vaccines.  We have moderna here     Let's try the lower dose of the topamax to see if that decreases side effects

## 2020-04-25 NOTE — Unmapped (Signed)
[facility] at [facility]       Type of visit:  face to face    Reason for visit: follow up    Questions / Concerns that need to be addressed: followup    General Consent to Treat (GCT) for non-epic video visits only: Verbal consent    Screening BP- 115/67              Allergies reviewed: Yes    Medication reviewed: Yes  Pended refills? No        HCDM reviewed and updated in Epic:    We are working to make sure all of our patients??? wishes are updated in Epic and part of that is documenting a Environmental health practitioner for each patient  A Health Care Decision Rodena Piety is someone you choose who can make health care decisions for you if you are not able ??? who would you most want to do this for you????  is already up to date.        BPAs completed:  PHQ9      COVID Vaccination:  If Care Gap for COVID vaccine is present:  Have you received a COVID vaccine? no  If yes: How many doses have you received? 0/1/2: 0   Type of Vaccine received?    Date of 1st dose:    Date of 2nd dose:    If no: Are you interested in scheduling? Declines vaccine        __________________________________________________________________________________________    SCREENINGS COMPLETED IN FLOWSHEETS           PHQ9  Thoughts that you would be better off dead, or of hurting yourself in some way: Not at all  PHQ-9 TOTAL SCORE: 0                      GAD7  GAD-7 Total Score: 0

## 2020-05-01 MED FILL — CETIRIZINE 10 MG TABLET: ORAL | 90 days supply | Qty: 90 | Fill #2

## 2020-05-01 MED FILL — CETIRIZINE 10 MG TABLET: 90 days supply | Qty: 90 | Fill #2 | Status: AC

## 2020-05-01 MED FILL — ATORVASTATIN 20 MG TABLET: ORAL | 30 days supply | Qty: 30 | Fill #1

## 2020-05-01 MED FILL — METFORMIN ER 500 MG TABLET,EXTENDED RELEASE 24 HR: ORAL | 90 days supply | Qty: 360 | Fill #2

## 2020-05-01 MED FILL — ATORVASTATIN 20 MG TABLET: 30 days supply | Qty: 30 | Fill #1 | Status: AC

## 2020-05-01 MED FILL — METFORMIN ER 500 MG TABLET,EXTENDED RELEASE 24 HR: 90 days supply | Qty: 360 | Fill #2 | Status: AC

## 2020-05-04 NOTE — Unmapped (Signed)
Phoned back [name] today after he left voicemail on my office line.    He was inquiring about his last sputum results from 6/24/20201:    He is aware that AFB smear negative and the culture is still pending at this time.     Discussed that his his lower respiratory culture did show:    4+ Burkholderia gladioliAbnormal      This is an organism he has cultured previously.     We reviewed his symptoms. He had the COVID-19 vaccine on 04/25/20 so was unsure if fever-like symptoms and chills were caused by the vaccine.      Pulmonary Symptoms:    Increased cough:   yes   Cough Mild or Significant:  significant   Coughing at Night?    yes   Cough:     wet (productive)    Sputum Description     states that it is a grayish color and also describes it as dark   Hemoptysis     no   Chest pain    Yes-at times feeling pain in his chest.    Chest tightness   no   Wheezing:     yes -most significant symptom right now.        Sinus Symptoms: None, continues on his nasal rinses and allergy medications at this time.     Neb Treatments:   Albuterol:    Has increased the amount of use on his inhaler   Requiring additional albuterol:  yes-using albuterol neb  3 times a day  Hypertonic Saline:    2 times a day  Currently on inhaled antibiotics:   yes         Cayston (aztreonam) -July is his month rotation on this medication.            Brett Hanna does not feel his symptoms are urgent at this time but states he feels like he is perhaps getting worse again.    At this time we discussed him continuing his course and his airway clearance.     Let him know that I would relay this message to Dr. Garner Nash. He has our on-call weekend/holiday number if he should need it as well.

## 2020-05-07 MED ORDER — LEVOFLOXACIN 750 MG TABLET
ORAL_TABLET | Freq: Every day | ORAL | 0 refills | 14.00000 days | Status: CP
Start: 2020-05-07 — End: 2020-05-21

## 2020-05-07 NOTE — Unmapped (Signed)
Based on prior sensitivity results of Burkholderia gladioli and fact he is on chronic Bactrim, starting him on 14 day course of Levofloxacin. Prescription sent to local pharmacy. He should continue the Bactrim as well.

## 2020-05-21 NOTE — Unmapped (Signed)
COVID Dose 2 Issue    Type: Rescheduling    If pt already had dose 2, what date: This is for dose 2    Additional details:  Patient would like his shot scheduled to match wife Lupita Leash) at 1:40 pm on 05/23/2020 his appointment is at 11:00 am. Wants it changed to match the 1:40pm appointment that wife has.  System will not let us alter or change appointment with Covid

## 2020-05-23 ENCOUNTER — Encounter: Admit: 2020-05-23 | Discharge: 2020-05-24 | Payer: PRIVATE HEALTH INSURANCE

## 2020-05-24 MED ORDER — ALBUTEROL SULFATE HFA 90 MCG/ACTUATION AEROSOL INHALER
11 refills | 0 days | Status: CP
Start: 2020-05-24 — End: 2021-05-24
  Filled 2020-05-30: qty 360, 30d supply, fill #3

## 2020-05-24 NOTE — Unmapped (Signed)
[facility] Refill Coordination Note    Specialty Medication(s) to be Shipped:   CF/Pulmonary: -Nebulized colistin (150mg  vials) and supply kit  Other medication(s) to be shipped: Albuterol 2.5mg /39ml neb solu, Albuterol HFA , Atorvastatin 20mg , BD Luer-Lok Syringes, Fluticasone nasal spray , LC Plus neb cups & Sterile Water     Brett Fogo Coppolino Sr., DOB: [birth year]  Phone: [phone] (home)     All above HIPAA information was verified with patient.     Was a Nurse, learning disability used for this call? No    Completed refill call assessment today to schedule patient's medication shipment from the [facility] [phone]).       Specialty medication(s) and dose(s) confirmed: Regimen is correct and unchanged.   Changes to medications: Brett Hanna reports no changes at this time.  Changes to insurance: No  Questions for the pharmacist: No    Confirmed patient received Welcome Packet with first shipment. The patient will receive a drug information handout for each medication shipped and additional FDA Medication Guides as required.       DISEASE/MEDICATION-SPECIFIC INFORMATION        For CF patients: CF [employer] Active? No-not enrolled    SPECIALTY MEDICATION ADHERENCE     Medication Adherence    Patient reported X missed doses in the last month: 0  Specialty Medication: Colistimethate 150mg   Patient is on additional specialty medications: No  Informant: patient  Reliability of informant: reliable  Support network for adherence: family member        Colistimethate 150mg : 0 days of medicine on hand (First week of Aug)    SHIPPING     Shipping address confirmed in Epic.     Delivery Scheduled: Yes, Expected medication delivery date: 05/30/2020.     Medication will be delivered via UPS to the prescription address in Epic WAM.    [name] Shared [facility] Specialty Technician

## 2020-05-29 NOTE — Unmapped (Signed)
Jannette Fogo Husted Sr. 's COLISTIMETHATE and supplies shipment will be canceled  as a result of a high copay.     I have reached out to the patient and communicated the delivery change. We will not reschedule the medication due to patient did not okay copay of 199.70. and have removed this/these medication(s) from the work request.  We have canceled this work request.

## 2020-05-30 MED FILL — ATORVASTATIN 20 MG TABLET: 30 days supply | Qty: 30 | Fill #2 | Status: AC

## 2020-05-30 MED FILL — FLUTICASONE PROPIONATE 50 MCG/ACTUATION NASAL SPRAY,SUSPENSION: 30 days supply | Qty: 16 | Fill #3 | Status: AC

## 2020-05-30 MED FILL — ATORVASTATIN 20 MG TABLET: ORAL | 30 days supply | Qty: 30 | Fill #2

## 2020-05-30 MED FILL — FLUTICASONE PROPIONATE 50 MCG/ACTUATION NASAL SPRAY,SUSPENSION: NASAL | 30 days supply | Qty: 16 | Fill #3

## 2020-05-30 MED FILL — ALBUTEROL SULFATE 2.5 MG/3 ML (0.083 %) SOLUTION FOR NEBULIZATION: 30 days supply | Qty: 360 | Fill #3 | Status: AC

## 2020-06-07 DIAGNOSIS — J471 Bronchiectasis with (acute) exacerbation: Principal | ICD-10-CM

## 2020-06-07 DIAGNOSIS — R895 Abnormal microbiological findings in specimens from other organs, systems and tissues: Principal | ICD-10-CM

## 2020-06-07 DIAGNOSIS — J479 Bronchiectasis, uncomplicated: Principal | ICD-10-CM

## 2020-06-07 MED ORDER — LEVOFLOXACIN 750 MG TABLET
ORAL_TABLET | Freq: Every day | ORAL | 0 refills | 21.00000 days | Status: CP
Start: 2020-06-07 — End: 2020-06-28

## 2020-06-07 NOTE — Unmapped (Signed)
Returned call to [name] this afternoon.         Date of symptom onset:  He reports that it is getting harder to breathe. States he feels like these are symptoms of bronchitis. Symptoms have worsened over last 4-5 days.     General Symptoms:  Fever:     no   Chills:     no   Nightsweats:    yes   Change in appetite:   no   Change in Energy Level:  no    Pulmonary Symptoms:    Increased cough:   yes   Cough Mild or Significant:  significant-reports to hurt his rubs   Cough:     dry (non-productive)-states he occasionally can cough ups sputum but not much.    Sputum Description     Not able to produce too much sputum at this time   Hemoptysis     no   Chest pain    no   Chest tightness   yes   Wheezing:     yes-it is  relieved by albuterol   Short of breath with activity:     yes-reports it is not unbearable yet but that it has been getting worse over 2-3 days.     Does not have a pulse oximeter at home.      Neb Treatments:   Albuterol:     two times a day  Requiring additional albuterol:  yes-using his inhaler every 4 hours  Hypertonic Saline:    three times a day  Currently on inhaled antibiotics:   yes         none-just finished Cayston. Have not been able to start the Colistin.        Inhaled Steroids     Advair      Has completed course of levaquin. Continues on bactrim.     Preferred Pharmacy:     [facility] [address] [place] - [address] Phone:  [phone]   Fax:  [phone]          He did not feel that these symptoms were urgent his Friday afternoon. Discussed that I would review the above with Dr. Garner Nash.

## 2020-06-07 NOTE — Unmapped (Signed)
Sent prescription for 3 week course of Levofloxacin to local pharmacy.  Once he completes this course, I would like him to start chronic azithromycin 250 mg daily as chronic anti-inflammatory.

## 2020-06-10 NOTE — Unmapped (Signed)
Spoke to [name]. He has already picked up both medications.     He will complete 3 week course of levofloxacin and then start the azithromycin as a daily medication.

## 2020-06-17 NOTE — Unmapped (Signed)
[facility] Refill Coordination Note    Specialty Medication(s) to be Shipped:   CF/Pulmonary: -Denied: Colistimethate 150mg  due to Cost  Other medication(s) to be shipped: Atorvastatin 20mg  & Advair 230-93mcg HFA   **Patient denied refills on ALLother medications due to cost or still having enough on hand.**     Brett Fogo Juhasz Sr., DOB: 12-20-1965  Phone: [phone] (home)     All above HIPAA information was verified with patient.     Was a Nurse, learning disability used for this call? No    Completed refill call assessment today to schedule patient's medication shipment from the [facility] [phone]).       Specialty medication(s) and dose(s) confirmed: Regimen is correct and unchanged.   Changes to medications: [name] reports no changes at this time.  Changes to insurance: No  Questions for the pharmacist: No    Confirmed patient received Welcome Packet with first shipment. The patient will receive a drug information handout for each medication shipped and additional FDA Medication Guides as required.       DISEASE/MEDICATION-SPECIFIC INFORMATION        For CF patients: CF [employer] Active? No-not enrolled    SPECIALTY MEDICATION ADHERENCE     Medication Adherence    Patient reported X missed doses in the last month: 0  Specialty Medication: Colistimethate 150mg   Patient is on additional specialty medications: No  Informant: patient  Reliability of informant: reliable  Support network for adherence: family member        SHIPPING     Shipping address confirmed in Epic.     Delivery Scheduled: Yes, Expected medication delivery date: 06/25/2020.     Medication will be delivered via UPS to the prescription address in Epic WAM.    Brett Hanna Shared [facility] Specialty Technician

## 2020-06-28 ENCOUNTER — Encounter
Admit: 2020-06-28 | Discharge: 2020-06-28 | Payer: PRIVATE HEALTH INSURANCE | Attending: Internal Medicine | Primary: Internal Medicine

## 2020-06-28 ENCOUNTER — Institutional Professional Consult (permissible substitution): Admit: 2020-06-28 | Discharge: 2020-06-28 | Payer: PRIVATE HEALTH INSURANCE

## 2020-06-28 DIAGNOSIS — Z9189 Other specified personal risk factors, not elsewhere classified: Principal | ICD-10-CM

## 2020-06-28 DIAGNOSIS — J479 Bronchiectasis, uncomplicated: Principal | ICD-10-CM

## 2020-06-28 DIAGNOSIS — J470 Bronchiectasis with acute lower respiratory infection: Principal | ICD-10-CM

## 2020-06-28 MED ORDER — SPIRIVA RESPIMAT 2.5 MCG/ACTUATION SOLUTION FOR INHALATION
Freq: Every day | RESPIRATORY_TRACT | 11 refills | 0.00000 days | Status: CP
Start: 2020-06-28 — End: ?

## 2020-06-28 MED ORDER — AZITHROMYCIN 250 MG TABLET
ORAL_TABLET | Freq: Every day | ORAL | 11 refills | 30.00000 days | Status: CP
Start: 2020-06-28 — End: 2021-06-28

## 2020-06-28 NOTE — Unmapped (Addendum)
[facility]  [facility]    06/28/20    8:46 AM    Assessment:      Patient:Brett Brett Hanna Bensman Sr. (12/12/1965)    Brett Hanna is a 54 y.o. male who is seen for follow-up of bronchiectasis secondary to alpha-1 antitrypsin carrier state with prior NTM infection status post treatment, OSA, and Burkholderia gladioli colonization.  His marked decline in his spirometry may be due to an acute bronchiectasis exacerbation but could also be attributed to his prior COVID-19 infection.  He clearly has worsening symptoms in the setting of discontinuation of inhaled antibiotics.     Plan:      Problem List Items Addressed This Visit        Respiratory    Bronchiectasis (CMS-HCC)    Relevant Medications    tiotropium bromide (SPIRIVA RESPIMAT) 2.5 mcg/actuation inhalation mist    Other Relevant Orders    ECG 12 Lead (Completed)    AFB SMEAR (Completed)      Other Visit Diagnoses     At risk for long QT syndrome    -  Primary    Relevant Orders    ECG 12 Lead (Completed)        Reinforced importance of regular airway clearance.  In bronchiectasis, there is impaired mucus clearance from both the airway changes and from the underlying cause of the bronchiectasis.  This impaired clearance leads to accumulation of mucus, resulting in inflammation, accumulation of bacteria, and worsening damage to the airways.  Therefore, augmentation of airway clearance is critical. Brett Hanna was provided websites with information about bronchiectasis, social forums for patients with bronchiectasis and NTM, and airway clearance techniques    Patient met with the respiratory therapist today to discuss home NIPPV equipment. No changes were made to airway clearance regimen.  The following changes were made to the inhaled medications: added Spiriva Respimat to use with Advair.    Continuing on chronic azithromycin 250 mg daily as anti-inflammatory agent.  EKG obtained today to assess QTc.    Patient spontaneously expectorated sputum that was sent for bacterial and AFB cultures. These results will help to guide antibiotic therapy in the future during exacerbations.  If he has future exacerbations, sputum should be sent for bacterial and AFB cultures. The bacterial culture should be processed like a cystic fibrosis sample given the overlapping pathogens. There are standing orders for these cultures in our system.    He should continue using his CPAP nightly to treat his OSA since he continues to derive benefit from it.    Brett Hanna should receive the flu vaccine in the fall.    Brett Hanna will return to clinic in 3 months for follow-up with pre-bronchodilator spirometry and sputum cultures.  He will call or send me a message via MyChart if questions or concerns arise before this visit.     The above plan was discussed with the patient and he is in agreement.    I personally spent 35 minutes face-to-face and non-face-to-face in the care of this patient, which includes all pre, intra, and post visit time on the date of service.    Addendum: 09/05/20:  The patient's CPAP that he uses nightly for OSA is no longer working properly and needs to be replaced.  Patient contacted his DME company to report malfunction. DME company notified patient and my team that in order to replace the CPAP, his last face to face clinic note would need to  be addended to document what was wrong with the machine and why it is essential that it is fixed or replaced.  Documentation of need and benefit has already been added to this note and is underlined.     Subjective:      HPI: Brett Hanna is a 54 y.o. male who is seen for follow-up of bronchiectasis secondary to alpha-1 antitrypsin carrier state with prior NTM infection status post treatment, OSA, and Burkholderia gladioli colonization.    09/05/19:  I last saw him in clinic on 11/01/18. He reports having a terrible time with wheezing. Having more frequent coughing spells with more sputum volume. Better for few hours and then returns. If leans over to tie shoes, starts coughing and chokes on volume of mucus. Then can cough up blood during those spells. Describes hemoptysis as blood mixed with sputum (more than streaks but not frank blood). Can get up in morning and be okay some days. Other days gets coughing spells and then gets headache and can't do anything. Started Cipro yesterday; has taken two doses so far and thinks already helping this morning. Didn't feel like response to Augmentin was sustained.  Supposed to be starting Cayston today but is unsure if he has new supply (has to check with wife). Has another day worth of Colistin.  Tightness over RUQ comes and goes. If bends towards right, gets spasm. Supposed to get flu shot on Friday.  Has questions about whether face shield is just as effective as wearing a mask to prevent COVID infection. Has trouble tolerating wearing a mask while seated - has back pain.    01/09/20:  At his last visit, I treated him with Cipro for 2 weeks for newly cultured Burkholderia gladioli.  He feels like it helps somewhat but did not completely resolve his symptoms.  Subsequent culture in December was positive for B gladioli.  He had COVID-19 diagnosed 10/23/19 and received a monoclonal antibody therapy.  He tells me today that he does not think that the therapy was very helpful to him; his wife disagrees.  Unfortunately, Brett Hanna has been off of his inhaled antibiotics due to high co-pay.  He feels that this is worsened his cough and breathing.  He is now having coughing attacks and notes more chest discomfort when coughing.  The coughing attacks started prior to discontinuation of inhaled antibiotics but have now worsened.  He is expectorating brown and somewhat bloody sputum although he is unsure if the blood in the sputum is coming from his sinuses since he has experienced more frequent nosebleeds recently.  He tolerated the Cipro very well aside from having some muscle stiffness in the morning.  Has been losing weight despite his appetite remaining robust.  He and his wife attribute this to his Jardiance.    04/02/20:  Sputum a little better but still there. Breathing a bit better. Over past 10 days, noticed a bit more.  Sputum is tan with rusty and green.  Not frank blood.  Yesterday, had coughing and wheezing all day.  Today is better.  Sputum comes up every time bends over.  Coughing spells not as bad or consistent as prior. When cough is looser, has raspy wheeze.  On Cayston right now. Awaiting Colistin. No F/C/NS. Just got new CPAP mask.  Walking for exercise - throughout the day.     06/28/20: Just started azitho. Cough improved but still there. Can cough up more sputum or have dry coughing. Can be short of breath at  rest - gradually worsening since prior to his abx.  Has to pause when exerting himself. Coughs more if lays on left side but noticing on occasion if on right.  Change from inside to outside and starts coughing.  Sputum green to yellow. No hemoptysis. He continues to use his PAP device and finds it very beneficial. He sleeps much better when wearing it and feels that his breathing is also better as a result.      Past Medical History:   Diagnosis Date   ??? Allergic rhinitis    ??? Alpha-1-antitrypsin deficiency carrier     with slightly low levels   ??? Amblyopia    ??? Asthma    ??? Asthma, extrinsic    ??? Bronchiectasis (CMS-HCC) 06/12/2011   ??? Chronic sinusitis    ??? Colon polyps    ??? COVID-19 10/24/2019   ??? Diabetes mellitus (CMS-HCC)    ??? GERD (gastroesophageal reflux disease)    ??? H/O Mycobacterium avium complex infection    ??? Impaired glucose tolerance    ??? Obesity    ??? Pneumonia    ??? Pseudomonas respiratory infection    ??? 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 Sleep apnea    ??? Strabismus    ??? Strep throat        Past Surgical History:   Procedure Laterality Date   ??? BRONCHOSCOPY     ??? LUNG BIOPSY     ??? PR BRONCHOSCOPY,DIAGNOSTIC W BRUSH Bilateral 08/21/2013    Procedure: BRONCHOSCOPY, RIGID OR FLEXIBLE, INCLUDING FLOURO GUIDED; DIAGNOSTIC, WITH BRUSHING OR PROTECTED BRUSHINGS;  Surgeon: Nash Dimmer, MD;  Location: [facility];  Service: Pulmonary   ??? PR BRONCHOSCOPY,DIAGNOSTIC W LAVAGE Bilateral 08/21/2013    Procedure: BRONCHOSCOPY, RIGID OR FLEXIBLE, INCLUDE FLUOROSCOPIC GUIDANCE WHEN PERFORMED; W/BRONCHIAL ALVEOLAR LAVAGE;  Surgeon: Nash Dimmer, MD;  Location: [facility] PROCED[facility] LAB [facility];  Service: Pulmonary   ??? PR COLONOSCOPY FLX DX W/COLLJ SPEC WHEN PFRMD N/A 03/21/2015    Procedure: COLONOSCOPY, FLEXIBLE, PROXIMAL TO SPLENIC FLEXURE; DIAGNOSTIC, W/WO COLLECTION SPECIMEN BY BRUSH OR WASH;  Surgeon: Brendia Sacks, MD;  Location: [facility];  Service: Gastroenterology   ??? SHOULDER SURGERY Right        Family History   Problem Relation Age of Onset   ??? Colon cancer Father    ??? Emphysema Maternal Uncle    ??? Heart disease Maternal Uncle    ??? Diabetes Paternal Grandfather        Social History     Tobacco Use   ??? Smoking status: Never Smoker   ??? Smokeless tobacco: Former Estate agent Use Topics   ??? Alcohol use: No     Alcohol/week: 0.0 standard drinks   ??? Drug use: No       Allergies  Reviewed at 8:19 AM      Reactions Comment    Codeine Shortness Of Breath, Rash     Rifampin  Fevers, chills.    Daliresp [roflumilast] Other (See Comments) Back pain          Current Outpatient Medications   Medication Sig Dispense Refill   ??? acetaminophen (TYLENOL EXTRA STRENGTH) 500 MG tablet Take 1 tablet by mouth as needed for pain     ??? albuterol 2.5 mg /3 mL (0.083 %) nebulizer solution Inhale the contents of 1 vial (3 mL) via nebulizer twice daily and every 6 hours as needed for shortness of breath and wheezing 360 mL 14   ???  albuterol HFA 90 mcg/actuation inhaler Inhale 2 puffs every four (4) hours as needed for wheezing 18 g 11   ??? alcohol swabs PadM Use as directed with inhaled antibiotics. 100 each 5   ??? atorvastatin (LIPITOR) 20 MG tablet Take 1 tablet (20 mg total) by mouth daily. 30 tablet 11   ??? azithromycin (ZITHROMAX) 250 MG tablet Take 1 tablet (250 mg total) by mouth daily. 30 tablet 11   ??? aztreonam lysine (CAYSTON) 75 mg/mL Nebu nebulization solution Inhale 1 mL (75 mg total) by nebulization every eight (8) hours. Nebulize every other month after airway clearance using Altera nebulizer 84 mL 5   ??? blood-glucose meter kit Check sugars once daily. 1 each 0   ??? cetirizine (ZYRTEC) 10 MG tablet Take 1 tablet (10 mg total) by mouth daily. 90 tablet 3   ??? cholecalciferol, vitamin D3, (VITAMIN D3) 2,000 unit cap Take 1 capsule (2,000 Units total) by mouth daily. 30 capsule prn   ??? colistimethate (COLYMYCIN) 150 mg injection Inject 2mL of sterile water for injection to mix Colistin Vial.Draw up 2mL (150mg ) mixed colistin & add 1mL SWFI for total volume=38mL. Inhale 3mL twice a day,every other month. 60 each 5   ??? empagliflozin (JARDIANCE) 25 mg tablet Take 1 tablet (25 mg total) by mouth daily. 90 tablet 3   ??? fexofenadine (ALLEGRA) 180 MG tablet Take 1 tablet (180 mg total) by mouth daily. (Patient taking differently: Take 180 mg by mouth daily. Alternates with zyrtec) 90 tablet 3   ??? fluticasone propion-salmeteroL (ADVAIR HFA) 230-21 mcg/actuation inhaler Inhale 2 puffs Two (2) times a day. (Patient taking differently: Inhale 2 puffs Two (2) times a day. Filling at [facility] 03/07/20) 36 g 3   ??? fluticasone propionate (FLONASE) 50 mcg/actuation nasal spray Use 2 sprays in each nostril route daily. 16 g 11   ??? lancets Misc 1 each by Other route once daily. Test daily before breakfast. Dispense 90 day supply. 100 each 3   ??? levoFLOXacin (LEVAQUIN) 750 MG tablet Take 1 tablet (750 mg total) by mouth daily for 21 days. 21 tablet 0   ??? metFORMIN (GLUCOPHAGE-XR) 500 MG 24 hr tablet Take 4 tablets (2,000 mg total) by mouth daily with evening meal. 360 tablet 3   ??? MISCELLANEOUS MEDICAL SUPPLY MISC Frequency:PHARMDIR   Dosage:0.0 Instructions:  Note:CPAP 15 cm H20 with heated humidity for nasal dryness.  Mask: ResMed Quattro size Medium. Dx OSA. Dose: 1     ??? montelukast (SINGULAIR) 10 mg tablet Take 1 tablet (10 mg total) by mouth nightly. 90 tablet 3   ??? nebulizers Misc Use with inhaled medication. 3 each 3   ??? sertraline (ZOLOFT) 50 MG tablet Take 1 tablet by mouth once daily 90 tablet 2   ??? sod bicarb-sod chlor-neti pot (AYR SALINE NASAL NETI RINSE) pkdv Use twice per day     ??? sodium chloride 7% 7 % Nebu Inhale 4 mL by nebulization Two (2) times a day. 720 mL 3   ??? sterile water, PF, Soln Use 2mL to mix colistin, then add 1mL sterile water for injection into neb cup along with colistin.Total volume 3mL 600 mL 5   ??? sulfamethoxazole-trimethoprim (BACTRIM) 400-80 mg per tablet Take 1 tablet (80 mg of trimethoprim total) by mouth Two (2) times a day. 180 tablet 3   ??? syringe with needle (BD LUER-LOK SYRINGE) 3 mL 20 gauge x 1 Syrg Use as directed with inhaled antibiotics. 60 each 5   ??? topiramate (  TOPAMAX) 100 MG tablet Take 0.5 tablets (50 mg total) by mouth nightly. 90 tablet 3     No current facility-administered medications for this visit.     Physical Exam:  BP 123/86  - Pulse 71  - Temp 36.3 ??C (97.3 ??F) (Temporal)  - Resp 18  - Ht 170.2 cm (5' 7)  - Wt 80.7 kg (178 lb)  - SpO2 96%  - BMI 27.88 kg/m??   Obese white male appearing comfortable and in no acute distress.  Face is thinner than prior.  Easy work of breathing without accessory muscle use.  Coarse breath sounds but no wheezing. Speaking in full sentences easily.  Regular rate and rhythm with normal S1 and S2.  No murmurs, rubs, or gallops.      Diagnostic Review:   The following data were reviewed during this visit with key findings summarized below:    Pulmonary Function Testing:    ?? FVC (% predicted) FEV1 (% predicted) FEV1/FVC   05/12/13 3.48 L (80%) 2.92 L (85%) 84%   06/26/14 2.84 L (65%) 2.29 L (67%) 81%   03/25/17 2.95 L (67%) 2.44 L (71%) 83%   06/29/17 2.68 L (62%) 2.34 L (70%) 87%   11/23/17 2.62 L (61%) 2.20 L (66%) 84%   03/08/18 2.59 L (60%) 2.06 L (62%) 80%   06/21/18 2.76 L (64%) 2.04 L (62%) 74%   11/01/18 2.63 L (61%) 2.08 L (63%) 79%   01/09/20 2.29 L (55%) 1.84 L (55%) 80%   04/02/20 2.27 L (54%) 1.91 L (58%) 84%     Cultures:  ?? Source Bacterial Culture AFB Smear AFB Culture   03/25/17 Sputum 3+ OPF; 3+ Stenotrophomonas negative negative   06/29/17 Sputum OPF negative Overgrown with bacteria/fungus   01/28/18 Sputum 2+ OPF; 2+ GNR glucose non-fermenter; Scedosporium negative 2 morphologies Streptomyces   02/01/18 Sputum 3+ OPF; 1+ GNR glucose non-fermenter; Scedosporium negative Streptomyces   03/02/18 Sputum 3+ OPF; 3+ GNR glucose non-fermenter; Scedosporium negative Streptomyces   08/07/19 Sputum 3+ OPF; 3+ B gladioli negative Negative   10/17/19 Sputum 2+ OPF; 4+ B gladioli negative Overgrown   01/09/20 Sputum 2+ OPF; 4+ B gladioli negative Overgrown   04/25/20 Sputum 4+ B gladioli negative Overgrown          ??  Imaging  Chest CT (03/18/18): Images personally reviewed. Bronchial wall thickening and bronchiectasis primarily involving the airways supplying the lingula and lung bases with associated clustered micronodular and tree-in-bud opacities. With respect to 02/02/2018, there is increased opacification at the deep posterior costophrenic recesses with more volume loss particularly at the left base, favored to represent atelectasis.  ??  Chest CT (02/02/18): Images personally reviewed. Bronchiectasis predominantly in bases of lingula, right middle lobe, and bilateral lower lobes (left greater than right), mild to moderately progressed since 03/30/2017. ?? New and increased nodular and tree-in-bud opacities, now involving all lobes. Patchy consolidations in the bases of the lingula, and lower lobes also stable to mildly increased. Also new left upper lobe nodular opacity with adjacent groundglass, 8 mm, (2:26). No definite reticular opacities, or honeycombing identified. No appreciable air trapping on expiratory imaging.  ??  Chest CT (03/30/17): Images personally reviewed. Interval worsening of bilateral lower lobe bronchiectasis and bronchial wall thickening with new lingular bronchiectasis. Increased clustered groundglass opacities with areas of consolidation in the lower lobes and lingula. Findings may represent sequela of endobronchial infection or impaired mucus clearance.  Bronchiectasis has been present on CT imaging since 06/12/11.  Cardiac:   EKG (06/28/20): HR 66 with QTc . Normal sinus rhythm.    Portions of this record have been created using Scientist, clinical (histocompatibility and immunogenetics). Dictation errors have been sought, but may not have been identified and corrected.    Viona Gilmore, MD  06/28/20  8:46 AM

## 2020-06-28 NOTE — Unmapped (Signed)
Working on getting you Incruse to use with Advair.    Getting EKG today.    Working on CPAP equipment.    Symptoms of a bronchiectasis exacerbation: 48 hours of at least three of the following:  ?? Increased cough  ?? Change in volume or appearance of sputum  ?? Increased sputum purulence  ?? Worsening shortness of breath and/or exercise tolerance  ?? Fatigue and/or malaise  ?? Coughing up blood (hemoptysis)    If you are experiencing some of these symptoms:  ?? Increase the frequency and/or intensity of your airway clearance  ?? Try to submit a sputum sample for bacterial and AFB cultures (at [facility] or locally)  ?? Reach out to me or your local physician.  ?? If you need antibiotics, recommend treating for 14 days.      Please bring any new airway clearance equipment to your next visit to ensure that you are using and caring for it properly.    Information about the COVID-19 vaccines: yourshot.org    Interested in participating in COVID-19 vaccine trials? https://www.coronaviruspreventionnetwork.org    Thank you for allowing me to be a part of your care. Please call the clinic with any questions.    Viona Gilmore, MD, MPH  Pulmonary and Critical Care Medicine  [address]  [id number]  [place], [place] [address]    Thank you for your visit to the [facility] Pulmonary Clinics. You may receive a survey from [facility] regarding your visit today, and we are eager to use this feedback to improve your experience. Thank you for taking the time to fill it out.    Between appointments, you can reach [place] at these numbers:  ??? For appointments: [phone]  ??? For my nurse, Arsenio Loader RN: [phone]  ??? Fax: [phone]  ??? For urgent issues after hours: Hospital Operator @ [phone] & ask for Pulmonary Fellow on call    For further information, check out the websites below:    [facility] for Bronchiectasis: ScienceMakers.nl    Impact Airway Clearance Education: http://www.impact-be.com    Videos from the patient session of the World Bronchiectasis Conference in [place] on May 15, 2017 can be watched here (TrustyNews.es).     Bronchiectasis Toolbox (information about airway clearance): DiningCalendar.de    Copy for Individuals with Bronchiectasis and/or NTM through the Bronchiectasis Registry and the COPD Foundation: https://www.mckee-young.org/    Information about Non-tuberculous Mycobacterial Infections:  https://www.ntminfo.org     Interested in clinical trials and other research opportunities?  www.clinicaltrials.gov    The Rare Diseases Clinical Research Network [facility]) Genetic Disorders of  Mucociliary Clearance Consortium [facility]) Contact Registry is a way for patients with disorders of mucociliary clearance (such as bronchiectasis) and their family members to learn about research studies they may be able to join. Participation is completely voluntary and you may choose to withdraw at any time. There is no cost to join the [employer]. For more information or to join the registry please go to the following website:  [email or web address]

## 2020-06-28 NOTE — Unmapped (Signed)
Introduced myself to patient in clinic. Patient stated that he is having issues with his CPAP and getting supplies for it.  He gets it through [facility].  I encouraged him to call the number on his CPAP and be persistent if he needs to be.  No other issues at this time.

## 2020-06-28 NOTE — Unmapped (Signed)
Bronchiectasis Clinic Pharmacist Note     Purpose: Medication Counseling/Education  ??? Counseled patient on proper administration technique for Spiriva Respimat inhaler and provided patient with an instruction handout to take home.  ??? Geologist, engineering for Spiriva Respimat and had patient sign in clinic and routed to [employer].    Total time spent: 5 minutes     Electronically signed:  Einar Gip, PharmD  PGY2 Ambulatory Care Pharmacy Resident     CC:   ??? Rolm Baptise, MD  ??? Arsenio Loader ([facility] Bronchiectasis Nurse Coordinator)    Clinical Pharmacist Attestation     I was present for the discussion of the patient. I reviewed the PGY2 Pharmacy Resident's note and agree with documentation.    Cosigned by:   Ferd Hibbs, CPP  Clinical Pharmacist Practitioner  [facility]   [facility]  June 28, 2020 1:23 PM.

## 2020-06-28 NOTE — Unmapped (Signed)
Checked-in on [name]. He was able to produce sputum this morning.     Sputum specimen sent by Jorja Loa, CMA.

## 2020-06-28 NOTE — Unmapped (Signed)
EKG completed and transmitted.

## 2020-07-01 MED FILL — ATORVASTATIN 20 MG TABLET: 30 days supply | Qty: 30 | Fill #3 | Status: AC

## 2020-07-01 MED FILL — ATORVASTATIN 20 MG TABLET: ORAL | 30 days supply | Qty: 30 | Fill #3

## 2020-07-01 NOTE — Unmapped (Signed)
Ms. Brett Hanna left a message on my office line. She wanted to let [place], CPP and Dr. Garner Nash know that [facility] [phone]) would be able to cover Colistin medication, but not for his diagnosis.     She stated it would need to be for a diagnosis of Cystic Fibrosis.     Attempted to call her back to le her know I would relay this message but there was no answer.

## 2020-07-02 NOTE — Unmapped (Signed)
Spoke to [name]. Let him know that unfortunately [facility] will not be an option for non-CF bronchiectasis.     Verbalized understanding of the frustration he feels with insurance barriers for his medication.     His wife has been in touch with Medicare and other pharmacies. [name] states that unfortunately their monthly income exempts them from qualifying for assistance.

## 2020-07-10 MED ORDER — OMEPRAZOLE 20 MG CAPSULE,DELAYED RELEASE
ORAL_CAPSULE | Freq: Two times a day (BID) | ORAL | 3 refills | 90 days | Status: CP
Start: 2020-07-10 — End: 2021-07-10
  Filled 2020-07-11: qty 180, 90d supply, fill #0

## 2020-07-11 MED FILL — MONTELUKAST 10 MG TABLET: ORAL | 90 days supply | Qty: 90 | Fill #1

## 2020-07-11 MED FILL — SULFAMETHOXAZOLE 400 MG-TRIMETHOPRIM 80 MG TABLET: 90 days supply | Qty: 180 | Fill #2 | Status: AC

## 2020-07-11 MED FILL — FLUTICASONE PROPIONATE 50 MCG/ACTUATION NASAL SPRAY,SUSPENSION: 30 days supply | Qty: 16 | Fill #4 | Status: AC

## 2020-07-11 MED FILL — MONTELUKAST 10 MG TABLET: 90 days supply | Qty: 90 | Fill #1 | Status: AC

## 2020-07-11 MED FILL — OMEPRAZOLE 20 MG CAPSULE,DELAYED RELEASE: 90 days supply | Qty: 180 | Fill #0 | Status: AC

## 2020-07-11 MED FILL — FLUTICASONE PROPIONATE 50 MCG/ACTUATION NASAL SPRAY,SUSPENSION: NASAL | 30 days supply | Qty: 16 | Fill #4

## 2020-07-11 MED FILL — SULFAMETHOXAZOLE 400 MG-TRIMETHOPRIM 80 MG TABLET: ORAL | 90 days supply | Qty: 180 | Fill #2

## 2020-07-15 NOTE — Unmapped (Signed)
[facility] Refill Coordination Note    Specialty Medication(s) to be Shipped:   CF/Pulmonary: -None: Denied- Colistmethate 150mg   Other medication(s) to be shipped: Metformin 500mg  & Atorvastatin 20mg    **Colistmethate is too expensive on insurance, due to pt being in the donut hole, pt denied refills at this time.**     Brett Fogo Landing Sr., DOB: [birth year]  Phone: [phone] (home)     All above HIPAA information was verified with patient.     Was a Nurse, learning disability used for this call? No    Completed refill call assessment today to schedule patient's medication shipment from the [facility] [phone]).       Specialty medication(s) and dose(s) confirmed: Regimen is correct and unchanged.   Changes to medications: [name] reports no changes at this time.  Changes to insurance: No  Questions for the pharmacist: No    Confirmed patient received Welcome Packet with first shipment. The patient will receive a drug information handout for each medication shipped and additional FDA Medication Guides as required.       DISEASE/MEDICATION-SPECIFIC INFORMATION        For CF patients: CF [employer] Active? No-not enrolled    SPECIALTY MEDICATION ADHERENCE     Medication Adherence    Patient reported X missed doses in the last month: 0  Specialty Medication: Colistimethate 150mg /ml  Patient is on additional specialty medications: No  Informant: patient  Reliability of informant: reliable  Support network for adherence: family member        SHIPPING     Shipping address confirmed in Epic.     Delivery Scheduled: Yes, Expected medication delivery date: 07/30/2020.     Medication will be delivered via UPS to the prescription address in Epic WAM.    Brett Hanna Shared [facility] Specialty Technician

## 2020-07-19 NOTE — Unmapped (Signed)
Spoke to [name].     I clarified Spiriva prescription and directions. He will do 2 puffs daily.    He states he has the Spiriva already.    While on the phone he asked about results of his sputum tests from 8/27:     0 Result Notes  Lower Respiratory Culture 3+ Burkholderia gladioliAbnormal     Susceptibility Testing By Consultation Only      Specimen Source: SPUTUM EXPECTORATED      ??           He has cultured this previously.     AFB culture was not able to be processed:    AFB Culture Overgrown with bacteria/fungus; unable to evaluate for acid fast bacilli.

## 2020-07-25 NOTE — Unmapped (Addendum)
[facility] Pharmacist Note     Purpose: Medication Consult  ??? Follow up on questions Lupita Leash had for [facility]  ----- Message from Amil Amen sent at 07/24/2020 12:02 PM EDT -----  Regarding: New Medicare Plan  I just got off the phone with Mr and Mrs [name].  They are working with a Medicare rep to pick a new plan for the new year.  They have a couple of questions that I couldn't answer and they would love to speak to one of you.    Two of the questions are:  1. Is he on any experimental treatments?  2. Can he start Colistin? (The rep told him the copay would be cheaper than Colistimethate)    The best number is [phone] and you can speak to [facility].     Thanks!  Jas    July 25, 2020 10:20 AM: Phone call to Lupita Leash  RE: Experimental Drugs  ?? I told Lupita Leash to let the agent know that [name] as a rare lung disorder, bronchiectasis and grows Burkholderia (similar to CF patients). While these are not experimental most of the medications we utilize are off-label meaning they are not FDA approved for the reasons/indications that we are asking for use for however, much of why we use these medications are due to our experience with these medications in CF populations.    Colistin:  ??? I spoke to Lupita Leash and clarified that both Colistin and colistimethate are the same medication (brand/generic). She reported that the Medicare agent working with them stated that Colistin was covered and not colistimethate.  ??? I advised Lupita Leash to be sure to investigate even if it's covered, what tier it is assigned. She stated they were looking at an [facility] Plan for [phone]. Just based on CY2021 [employer] Med Advantage, colistimethate vials are Tier 4, which are $100 per 30-days.    Inhalers:  ?? I noted that regardless of Med D vs Advantage plans that most inhalers are Tier 3. Just based on this year's [employer]na Med Adv, Tier 3 is $47.    Other Inhaled antibiotics:  ?? At beginning of year, will [place] will need new PA and likely will need the 2 denials in order to submit for MFG assistance as we usually do each year.  ?? Another option would be generic inhaled tobramycin nebs but for [employer] Med Adv this year formulary assigns it as Tier 3.     Recommendations for GM0102:  o There is always the option to request Tier exception each calendar year as we were successful in doing so this year bringing the colistimethate to Tier 2.   o Consolidate Advair HFA and Spiriva Respimat to Trelegy Ellipta.  o Can also pursue MFG assistance (Trelegy via [employer])    Total time spent: 15 minutes     Electronically signed:  Anell Barr, PharmD, BCPS, CPP  Clinical Pharmacist Practitioner  [facility]    CC:   ??? Rolm Baptise, MD  ??? Amil Amen (Pharmacy Specialist)  ??? Gunnar Fusi Marinis ([facility] Financial planner)

## 2020-07-29 MED FILL — ATORVASTATIN 20 MG TABLET: 30 days supply | Qty: 30 | Fill #4 | Status: AC

## 2020-07-29 MED FILL — METFORMIN ER 500 MG TABLET,EXTENDED RELEASE 24 HR: 90 days supply | Qty: 360 | Fill #3 | Status: AC

## 2020-07-29 MED FILL — METFORMIN ER 500 MG TABLET,EXTENDED RELEASE 24 HR: ORAL | 90 days supply | Qty: 360 | Fill #3

## 2020-07-29 MED FILL — ATORVASTATIN 20 MG TABLET: ORAL | 30 days supply | Qty: 30 | Fill #4

## 2020-08-05 NOTE — Unmapped (Signed)
[facility] Clinical Assessment & Refill Coordination Note    Patient will not fill colistin again until the next year when he gets new insurance.  Informed him that we will call him again first week of Jan to obtain insurance info and test claim Colistin.  Until then, he can call in for his non-spec refills as he needs them.     Brett Fogo Pasqual Sr., DOB: Apr 21, 1966  Phone: [phone] (home)     All above HIPAA information was verified with patient.     Was a Nurse, learning disability used for this call? No    Specialty Medication(s):   CF/Pulmonary: -Nebulized colistin (150mg  vials) and supply kit     Current Outpatient Medications   Medication Sig Dispense Refill   ??? acetaminophen (TYLENOL EXTRA STRENGTH) 500 MG tablet Take 1 tablet by mouth as needed for pain     ??? albuterol 2.5 mg /3 mL (0.083 %) nebulizer solution Inhale the contents of 1 vial (3 mL) via nebulizer twice daily and every 6 hours as needed for shortness of breath and wheezing 360 mL 14   ??? albuterol HFA 90 mcg/actuation inhaler Inhale 2 puffs every four (4) hours as needed for wheezing 18 g 11   ??? atorvastatin (LIPITOR) 20 MG tablet Take 1 tablet (20 mg total) by mouth daily. 30 tablet 11   ??? azithromycin (ZITHROMAX) 250 MG tablet Take 1 tablet (250 mg total) by mouth daily. 30 tablet 11   ??? aztreonam lysine (CAYSTON) 75 mg/mL Nebu nebulization solution Inhale 1 mL (75 mg total) by nebulization every eight (8) hours. Nebulize every other month after airway clearance using Altera nebulizer 84 mL 5   ??? blood-glucose meter kit Check sugars once daily. 1 each 0   ??? cetirizine (ZYRTEC) 10 MG tablet Take 1 tablet (10 mg total) by mouth daily. 90 tablet 3   ??? cholecalciferol, vitamin D3, (VITAMIN D3) 2,000 unit cap Take 1 capsule (2,000 Units total) by mouth daily. 30 capsule prn   ??? colistimethate (COLYMYCIN) 150 mg injection Inject 2mL of sterile water for injection to mix Colistin Vial.Draw up 2mL (150mg ) mixed colistin & add 1mL SWFI for total volume=13mL. Inhale 3mL twice a day,every other month. 60 each 5   ??? empagliflozin (JARDIANCE) 25 mg tablet Take 1 tablet (25 mg total) by mouth daily. 90 tablet 3   ??? fexofenadine (ALLEGRA) 180 MG tablet Take 1 tablet (180 mg total) by mouth daily. (Patient taking differently: Take 180 mg by mouth daily. Alternates with zyrtec) 90 tablet 3   ??? fluticasone propion-salmeteroL (ADVAIR HFA) 230-21 mcg/actuation inhaler Inhale 2 puffs Two (2) times a day. (Patient taking differently: Inhale 2 puffs Two (2) times a day. Filling at [facility] 03/07/20) 36 g 3   ??? fluticasone propionate (FLONASE) 50 mcg/actuation nasal spray Use 2 sprays in each nostril route daily. 16 g 11   ??? lancets Misc 1 each by Other route once daily. Test daily before breakfast. Dispense 90 day supply. 100 each 3   ??? metFORMIN (GLUCOPHAGE-XR) 500 MG 24 hr tablet Take 4 tablets (2,000 mg total) by mouth daily with evening meal. 360 tablet 3   ??? MISCELLANEOUS MEDICAL SUPPLY MISC Frequency:PHARMDIR   Dosage:0.0     Instructions:  Note:CPAP 15 cm H20 with heated humidity for nasal dryness.  Mask: ResMed Quattro size Medium. Dx OSA. Dose: 1     ??? montelukast (SINGULAIR) 10 mg tablet Take 1 tablet (10 mg total) by mouth  nightly. 90 tablet 3   ??? nebulizers Misc Use with inhaled medication. 3 each 3   ??? omeprazole (PRILOSEC) 20 MG capsule Take 1 capsule (20 mg total) by mouth two (2) times a day. 180 capsule 3   ??? sertraline (ZOLOFT) 50 MG tablet Take 1 tablet by mouth once daily 90 tablet 2   ??? sod bicarb-sod chlor-neti pot (AYR SALINE NASAL NETI RINSE) pkdv Use twice per day     ??? sodium chloride 7% 7 % Nebu Inhale 4 mL by nebulization Two (2) times a day. 720 mL 3   ??? sterile water, PF, Soln Use 2mL to mix colistin, then add 1mL sterile water for injection into neb cup along with colistin.Total volume 3mL 600 mL 5   ??? sulfamethoxazole-trimethoprim (BACTRIM) 400-80 mg per tablet Take 1 tablet (80 mg of trimethoprim total) by mouth Two (2) times a day. 180 tablet 3   ??? syringe with needle (BD LUER-LOK SYRINGE) 3 mL 20 gauge x 1 Syrg Use as directed with inhaled antibiotics. 60 each 5   ??? tiotropium bromide (SPIRIVA RESPIMAT) 2.5 mcg/actuation inhalation mist Inhale 2 puffs daily. 4 g 11   ??? topiramate (TOPAMAX) 100 MG tablet Take 0.5 tablets (50 mg total) by mouth nightly. 90 tablet 3     No current facility-administered medications for this visit.        Changes to medications: Brett Hanna reports no changes at this time.    Allergies   Allergen Reactions   ??? Codeine Shortness Of Breath and Rash   ??? Rifampin      Fevers, chills.   ??? Daliresp [Roflumilast] Other (See Comments)     Back pain       Changes to allergies: No    SPECIALTY MEDICATION ADHERENCE     Colistin 150 mg: 0 days of medicine on hand   Albuterol 0.083% nebs : 10 days of medicine on hand       Medication Adherence    Support network for adherence: family member          Specialty medication(s) dose(s) confirmed: Patient is holding colistin right now, won't fill it until he gets new insurance in the new year     Are there any concerns with adherence? No    Adherence counseling provided? Not needed    CLINICAL MANAGEMENT AND INTERVENTION      Clinical Benefit Assessment:    Do you feel the medicine is effective or helping your condition? Patient declined to answer    Clinical Benefit counseling provided? Not needed    Adverse Effects Assessment:    Are you experiencing any side effects? No    Are you experiencing difficulty administering your medicine? No    Quality of Life Assessment:    How many days over the past month did your bronchiectasis  keep you from your normal activities? For example, brushing your teeth or getting up in the morning. Patient declined to answer    Have you discussed this with your provider? Yes    Therapy Appropriateness:    Is therapy appropriate? Pharmacist will consult provider    DISEASE/MEDICATION-SPECIFIC INFORMATION      N/A    PATIENT SPECIFIC NEEDS - Does the patient have any physical, cognitive, or cultural barriers? No    - Is the patient high risk? No    - Does the patient require a Care Management Plan? No     - Does the patient require physician intervention or other additional  services (i.e. nutrition, smoking cessation, social work)? No      SHIPPING     Specialty Medication(s) to be Shipped:   Declined fill     Other medication(s) to be shipped: albuterol 0.083%  and flonase      Changes to insurance: No    Delivery Scheduled: Yes, Expected medication delivery date: 10/7.     Medication will be delivered via UPS to the confirmed prescription address in [facility].    The patient will receive a drug information handout for each medication shipped and additional FDA Medication Guides as required.  Verified that patient has previously received a Conservation officer, historic buildings.    All of the patient's questions and concerns have been addressed.    Brett Hanna   [facility] Shared [facility] Specialty Pharmacist

## 2020-08-07 MED FILL — ALBUTEROL SULFATE 2.5 MG/3 ML (0.083 %) SOLUTION FOR NEBULIZATION: 30 days supply | Qty: 360 | Fill #4 | Status: AC

## 2020-08-07 MED FILL — FLUTICASONE PROPIONATE 50 MCG/ACTUATION NASAL SPRAY,SUSPENSION: 30 days supply | Qty: 16 | Fill #5 | Status: AC

## 2020-08-07 MED FILL — FLUTICASONE PROPIONATE 50 MCG/ACTUATION NASAL SPRAY,SUSPENSION: NASAL | 30 days supply | Qty: 16 | Fill #5

## 2020-08-07 MED FILL — ALBUTEROL SULFATE 2.5 MG/3 ML (0.083 %) SOLUTION FOR NEBULIZATION: RESPIRATORY_TRACT | 30 days supply | Qty: 360 | Fill #4

## 2020-08-20 ENCOUNTER — Ambulatory Visit: Admit: 2020-08-20 | Discharge: 2020-08-21 | Payer: PRIVATE HEALTH INSURANCE

## 2020-08-20 DIAGNOSIS — E119 Type 2 diabetes mellitus without complications: Principal | ICD-10-CM

## 2020-08-20 DIAGNOSIS — D126 Benign neoplasm of colon, unspecified: Principal | ICD-10-CM

## 2020-08-20 DIAGNOSIS — K760 Fatty (change of) liver, not elsewhere classified: Principal | ICD-10-CM

## 2020-08-20 DIAGNOSIS — Z148 Genetic carrier of other disease: Principal | ICD-10-CM

## 2020-08-20 DIAGNOSIS — D751 Secondary polycythemia: Principal | ICD-10-CM

## 2020-08-20 LAB — COMPREHENSIVE METABOLIC PANEL
ALBUMIN: 4.5 g/dL (ref 3.4–5.0)
ALKALINE PHOSPHATASE: 123 U/L — ABNORMAL HIGH (ref 46–116)
ALT (SGPT): 20 U/L (ref 10–49)
ANION GAP: 11 mmol/L (ref 5–14)
AST (SGOT): 21 U/L (ref <=34)
BILIRUBIN TOTAL: 0.8 mg/dL (ref 0.3–1.2)
BUN / CREAT RATIO: 18
CALCIUM: 9.8 mg/dL (ref 8.7–10.4)
CHLORIDE: 105 mmol/L (ref 98–107)
CO2: 20.8 mmol/L (ref 20.0–31.0)
CREATININE: 0.76 mg/dL
EGFR CKD-EPI AA MALE: >90 mL/min/{1.73_m2} (ref >=60)
EGFR CKD-EPI NON-AA MALE: >90 mL/min/{1.73_m2} (ref >=60)
GLUCOSE RANDOM: 88 mg/dL (ref 70–179)
POTASSIUM: 4.1 mmol/L (ref 3.4–4.5)
PROTEIN TOTAL: 8.1 g/dL (ref 5.7–8.2)
SODIUM: 137 mmol/L (ref 135–145)

## 2020-08-20 LAB — CBC
HEMATOCRIT: 50.6 % — ABNORMAL HIGH (ref 38.0–50.0)
HEMOGLOBIN: 16.6 g/dL (ref 13.5–17.5)
MEAN CORPUSCULAR HEMOGLOBIN CONC: 32.8 g/dL (ref 30.0–36.0)
MEAN CORPUSCULAR HEMOGLOBIN: 27.6 pg (ref 26.0–34.0)
MEAN CORPUSCULAR VOLUME: 84.3 fL (ref 81.0–95.0)
MEAN PLATELET VOLUME: 8 fL (ref 7.0–10.0)
PLATELET COUNT: 265 10*9/L (ref 150–450)
RED BLOOD CELL COUNT: 6.01 10*12/L — ABNORMAL HIGH (ref 4.32–5.72)
RED CELL DISTRIBUTION WIDTH: 16.9 % — ABNORMAL HIGH (ref 12.0–15.0)

## 2020-08-20 LAB — EGFR CKD-EPI NON-AA MALE
Glomerular filtration rate/1.73 sq M.predicted.non black:ArVRat:Pt:Ser/Plas/Bld:Qn:Creatinine-based formula (CKD-EPI): >90

## 2020-08-20 LAB — PROTIME-INR: PROTIME: 11.1 s (ref 10.3–13.4)

## 2020-08-20 LAB — INR: Coagulation tissue factor induced.INR:RelTime:Pt:PPP:Qn:Coag: 0.95

## 2020-08-20 LAB — RED CELL DISTRIBUTION WIDTH: Lab: 16.9 — ABNORMAL HIGH

## 2020-08-20 LAB — HEMOGLOBIN A1C: Hemoglobin A1c/Hemoglobin.total:MFr:Pt:Bld:Qn:: 6.5 — ABNORMAL HIGH

## 2020-08-20 NOTE — Unmapped (Signed)
Assessment and Plan    1. Benign neoplasm of colon, unspecified part of colon    2. Alpha-1-antitrypsin deficiency carrier    3. Fatty liver    4. Type 2 diabetes mellitus without complication, without long-term current use of insulin (CMS-HCC)        {TIP - HCC- RAFF Pilot- Clinical Documentation Specialist Recommendations-  No specialty comments available.   This text will self delete upon signing note:[id number]}       #1. ***    #2. ***    #3. ***    #4. ***    Orders Placed This Encounter   Procedures   ??? CBC   ??? Comprehensive Metabolic Panel   ??? PT-INR         Follow up: Return in about 3 months (around 11/20/2020), or if symptoms worsen or fail to improve.        Reason for visit: Follow-up  , ***    Brett Simpler Sr. is a 54 y.o. male who presents for follow up.    HPI  His wife thinks his cough is worse than usual.  Not bringing up blood  He isn't sure if it's worse. Some days loose and some days tight. Doing chest PT twice a day. Has a lot of wheezing. The spiriva has helped some.     Father had colon cancer. Doesn't want to get colo now.         Medications were reviewed    Physical Exam  Gen***  Vitals:    08/20/20 1040   BP: 128/85   Pulse: 76   Resp: 18   Temp: 36.4 ??C (97.6 ??F)   TempSrc: Oral   Weight: 80 kg (176 lb 6.4 oz)   Height: 170.2 cm (5' 7)     CV: RRR, no mrg  Pulm: CTAB  Ext: no edema  ***    PHQ-9 Score:     GAD-7 Score:       Wt Readings from Last 6 Encounters:   08/20/20 80 kg (176 lb 6.4 oz)   06/28/20 80.7 kg (178 lb)   04/25/20 81.8 kg (180 lb 6.4 oz)   04/02/20 82.1 kg (181 lb)   04/02/20 81.2 kg (179 lb)   01/09/20 81.2 kg (179 lb)         Medication adherence and barriers to the treatment plan have been addressed. Opportunities to optimize healthy behaviors have been discussed. Patient / caregiver voiced understanding. times.     He has several questions about the COVID vaccine and flu shot.        Medications were reviewed    Physical Exam  Gen: well appearing, NAD  Vitals:    08/20/20 1040   BP: 128/85   Pulse: 76   Resp: 18   Temp: 36.4 ??C (97.6 ??F)   TempSrc: Oral   Weight: 80 kg (176 lb 6.4 oz)   Height: 170.2 cm (5' 7)   HEENT: L ear canal clear. R ear canal with obstructing wax. Removed with instrumentation with no complications.  CV: RRR, no mrg  Pulm: course bs and diffuse wheezing bilat  Ext: no edema      Reviewed pulmonology notes  Reviewed sputum cultures-4+ burkholderia    Wt Readings from Last 6 Encounters:   08/20/20 80 kg (176 lb 6.4 oz)   06/28/20 80.7 kg (178 lb)   04/25/20 81.8 kg (180 lb 6.4 oz)   04/02/20 82.1 kg (181 lb)   04/02/20 81.2  kg (179 lb)   01/09/20 81.2 kg (179 lb)         Medication adherence and barriers to the treatment plan have been addressed. Opportunities to optimize healthy behaviors have been discussed. Patient / caregiver voiced understanding.

## 2020-08-20 NOTE — Unmapped (Addendum)
Flu shot today  Labs today  You're due for colonoscopy-we'll talk more next time

## 2020-08-20 NOTE — Unmapped (Signed)
[facility] at [facility]       Type of visit:  face to face    Reason for visit: f/u    Questions / Concerns that need to be addressed: f/i    General Consent to Treat (GCT) for non-epic video visits only: Verbal consent    Screening BP- 128/85        Allergies reviewed: Yes    Medication reviewed: Yes  Pended refills? No        HCDM reviewed and updated in Epic:    We are working to make sure all of our patients??? wishes are updated in Epic and part of that is documenting a Environmental health practitioner for each patient  A Health Care Decision Rodena Piety is someone you choose who can make health care decisions for you if you are not able ??? who would you most want to do this for you????  is already up to date.        BPAs completed:  HARK - Interpersonal Violence      COVID Vaccination:  If Care Gap for COVID vaccine is present:  Have you received a COVID vaccine? yes  If yes: How many doses have you received? 0/1/2: 2   Type of Vaccine received? Moderna   Date of 1st dose:    Date of 2nd dose:    If no: Are you interested in scheduling?         __________________________________________________________________________________________

## 2020-08-21 LAB — ADDON DIFFERENTIAL ONLY
BASOPHILS ABSOLUTE COUNT: 0.1 10*9/L (ref 0.0–0.1)
BASOPHILS RELATIVE PERCENT: 0.7 %
EOSINOPHILS RELATIVE PERCENT: 3.3 %
LYMPHOCYTES RELATIVE PERCENT: 9.8 %
MONOCYTES ABSOLUTE COUNT: 0.7 10*9/L (ref 0.1–1.0)
NEUTROPHILS ABSOLUTE COUNT: 6.5 10*9/L (ref 1.7–7.7)
NEUTROPHILS RELATIVE PERCENT: 77.5 %

## 2020-08-21 LAB — NEUTROPHILS RELATIVE PERCENT: Neutrophils/100 leukocytes:NFr:Pt:Bld:Qn:Automated count: 77.5

## 2020-08-21 NOTE — Unmapped (Signed)
Patient Question/ Issue from Patient    ???   Questions about blood work red flags   ??? Caller described issue: blood work red flags   ??? Caller desired outcome call from provider to discuss results of blood work  ??? Best callback number if any questions (defaults to patient's preferred phone - confirm or change): [phone]  ??? PCP: Danielle Dess, MD  ??? Last encounter in department: 08/20/2020  (If more than a year, offer an appointment.)

## 2020-08-27 NOTE — Unmapped (Signed)
Brett Hanna phoned today. Stated that they are moving on date of his next scheduled appointment in December and need to reschedule.     They were made aware that there were no openings until January and she did not think he could wait that long.    Saw internal medicine and has polycythemia noted (lans in Epic) but [name] also with increased cough and wheezing.     Follow-up: please let me know if there is another appointment date/time that I can offer them if able.

## 2020-08-28 DIAGNOSIS — G4733 Obstructive sleep apnea (adult) (pediatric): Principal | ICD-10-CM

## 2020-08-28 NOTE — Unmapped (Signed)
Spoke to Ms. Blower and [name] today.     I did let her know that I will discuss earlier appointment with Dr. Garner Nash. Let them know it will most liklrly need to be a Friday after 1 pm.     While on the phone [name] stated that his CPAP motor life expired and not blowing like it used to.    He contacted [facility] but they said an new order would be need by Dr. Garner Nash.     Let them know that I would relay this information to team to see what would be needed.

## 2020-08-28 NOTE — Unmapped (Signed)
Received the following message from [facility] regarding my request for a compliance report on patient's CPAP:    No we cannot. He has a S9 elite and would need to send in his card by mail and the Ellery Plunk would have to download manually and then fax it    Lorelle Formosa RRT

## 2020-08-28 NOTE — Unmapped (Signed)
Updated prescription sent to [facility] for CPAP.  Patient notified via MyChart.

## 2020-08-28 NOTE — Unmapped (Signed)
Sent a message to [facility] requesting a CPAP compliance report for patient's CPAP.

## 2020-09-06 NOTE — Unmapped (Signed)
Phoned Ms. Wolven and let her know I will still work to see if we can change his visit, but that I did not want to cancel his 10/04/20 visit just incase.       Also let her know that per Gershon Cull, RT had been in communication with [facility] specialists about the CPAP this morning and that hopefully they should ne receiving a call soon.     [name] did say that if we were not able to get a sooner appointment that they would just keep their 12/3 visit.     Let her know that I will follow-up with her on this.

## 2020-09-11 DIAGNOSIS — J479 Bronchiectasis, uncomplicated: Principal | ICD-10-CM

## 2020-09-11 NOTE — Unmapped (Signed)
Visit has been moved to 09/12/2020. Patient is aware and in agreement.

## 2020-09-12 DIAGNOSIS — Z148 Genetic carrier of other disease: Principal | ICD-10-CM

## 2020-09-12 DIAGNOSIS — Z825 Family history of asthma and other chronic lower respiratory diseases: Principal | ICD-10-CM

## 2020-09-12 DIAGNOSIS — G4733 Obstructive sleep apnea (adult) (pediatric): Principal | ICD-10-CM

## 2020-09-12 DIAGNOSIS — E8801 Alpha-1-antitrypsin deficiency: Principal | ICD-10-CM

## 2020-09-12 DIAGNOSIS — Z885 Allergy status to narcotic agent status: Principal | ICD-10-CM

## 2020-09-12 DIAGNOSIS — D751 Secondary polycythemia: Principal | ICD-10-CM

## 2020-09-12 DIAGNOSIS — E119 Type 2 diabetes mellitus without complications: Principal | ICD-10-CM

## 2020-09-12 DIAGNOSIS — Z8616 Personal history of COVID-19: Principal | ICD-10-CM

## 2020-09-12 DIAGNOSIS — Z888 Allergy status to other drugs, medicaments and biological substances status: Principal | ICD-10-CM

## 2020-09-12 DIAGNOSIS — Z79891 Long term (current) use of opiate analgesic: Principal | ICD-10-CM

## 2020-09-12 DIAGNOSIS — Z8701 Personal history of pneumonia (recurrent): Principal | ICD-10-CM

## 2020-09-12 DIAGNOSIS — Z20822 Contact with and (suspected) exposure to covid-19: Principal | ICD-10-CM

## 2020-09-12 DIAGNOSIS — R0602 Shortness of breath: Principal | ICD-10-CM

## 2020-09-12 DIAGNOSIS — J471 Bronchiectasis with (acute) exacerbation: Principal | ICD-10-CM

## 2020-09-12 DIAGNOSIS — J45909 Unspecified asthma, uncomplicated: Principal | ICD-10-CM

## 2020-09-12 DIAGNOSIS — E785 Hyperlipidemia, unspecified: Principal | ICD-10-CM

## 2020-09-12 DIAGNOSIS — Z79899 Other long term (current) drug therapy: Principal | ICD-10-CM

## 2020-09-12 DIAGNOSIS — J479 Bronchiectasis, uncomplicated: Principal | ICD-10-CM

## 2020-09-12 DIAGNOSIS — Z6830 Body mass index (BMI) 30.0-30.9, adult: Principal | ICD-10-CM

## 2020-09-12 DIAGNOSIS — Z792 Long term (current) use of antibiotics: Principal | ICD-10-CM

## 2020-09-12 DIAGNOSIS — E669 Obesity, unspecified: Principal | ICD-10-CM

## 2020-09-12 DIAGNOSIS — Z8669 Personal history of other diseases of the nervous system and sense organs: Principal | ICD-10-CM

## 2020-09-12 DIAGNOSIS — Z7984 Long term (current) use of oral hypoglycemic drugs: Principal | ICD-10-CM

## 2020-09-12 DIAGNOSIS — Z86010 Personal history of colonic polyps: Principal | ICD-10-CM

## 2020-09-12 DIAGNOSIS — Z87891 Personal history of nicotine dependence: Principal | ICD-10-CM

## 2020-09-12 DIAGNOSIS — A498 Other bacterial infections of unspecified site: Principal | ICD-10-CM

## 2020-09-12 LAB — CBC W/ AUTO DIFF
BASOPHILS ABSOLUTE COUNT: 0.1 10*9/L (ref 0.0–0.1)
BASOPHILS RELATIVE PERCENT: 1.1 %
EOSINOPHILS ABSOLUTE COUNT: 0.2 10*9/L (ref 0.0–0.7)
EOSINOPHILS RELATIVE PERCENT: 2.1 %
HEMATOCRIT: 49.2 % (ref 38.0–50.0)
HEMOGLOBIN: 16.1 g/dL (ref 13.5–17.5)
LYMPHOCYTES ABSOLUTE COUNT: 0.8 10*9/L (ref 0.7–4.0)
LYMPHOCYTES RELATIVE PERCENT: 10.1 %
MEAN CORPUSCULAR HEMOGLOBIN CONC: 32.8 g/dL (ref 30.0–36.0)
MEAN CORPUSCULAR HEMOGLOBIN: 28 pg (ref 26.0–34.0)
MEAN CORPUSCULAR VOLUME: 85.5 fL (ref 81.0–95.0)
MEAN PLATELET VOLUME: 7.9 fL (ref 7.0–10.0)
MONOCYTES ABSOLUTE COUNT: 0.7 10*9/L (ref 0.1–1.0)
MONOCYTES RELATIVE PERCENT: 8.9 %
NEUTROPHILS ABSOLUTE COUNT: 6.2 10*9/L (ref 1.7–7.7)
NEUTROPHILS RELATIVE PERCENT: 77.8 %
PLATELET COUNT: 271 10*9/L (ref 150–450)
RED BLOOD CELL COUNT: 5.75 10*12/L — ABNORMAL HIGH (ref 4.32–5.72)
RED CELL DISTRIBUTION WIDTH: 16.1 % — ABNORMAL HIGH (ref 12.0–15.0)
WBC ADJUSTED: 8 10*9/L (ref 3.5–10.5)

## 2020-09-12 LAB — IGM: GAMMAGLOBULIN; IGM: 82 mg/dL (ref 40–230)

## 2020-09-12 LAB — SEDIMENTATION RATE, MANUAL: ERYTHROCYTE SEDIMENTATION RATE: 29 mm/h — ABNORMAL HIGH (ref 0–20)

## 2020-09-12 LAB — C-REACTIVE PROTEIN: C-REACTIVE PROTEIN: 11 mg/L — ABNORMAL HIGH (ref <=10.0)

## 2020-09-12 NOTE — Unmapped (Signed)
Per Dr. Garner Nash, phoned [facility] to schedule inpatient admission for Brett Hanna for as soon as a bed was available at [facility].     [facility] stated there would not be a bed available but should in the near future.     This was communicated with Dr. Garner Nash.

## 2020-09-12 NOTE — Unmapped (Signed)
[facility]  [facility]    09/12/20    11:32 AM    Assessment:      Patient:Brett Brett Hanna Lebeck Sr. (12-02-1965)    [name] is a 54 y.o. male who is seen for follow-up of bronchiectasis secondary to alpha-1 antitrypsin carrier state with prior NTM infection status post treatment, OSA, and Burkholderia gladioli colonization.  His spirometry declined starting the beginning of 2021, possibly secondary to prior COVID-19 infection.  However, I am concerned about his Burkholderia gladioli since it was first cultured in 08/2019 and has persisted during this same time frame.  Since there was no oropharyngeal flora isolated on his last 2 sputum cultures, this suggest that the Burkholderia has overgrown the other bacteria in the lungs.  He had been treated with Bactrim and levofloxacin in August.  His Burkholderia was sensitive to both of these antibiotics, suggesting that he failed outpatient oral antibiotics.  Therefore, I feel that it is appropriate to have him admitted to the hospital for PICC line placement, IV antibiotics, and augmentation of his airway clearance.       Plan:      Problem List Items Addressed This Visit        Respiratory    Bronchiectasis (CMS-HCC) - Primary    Relevant Orders    CBC w/ Differential (Completed)    IgG 1, 2, 3, and 4    IgM (Completed)    6 - Minute walk (Completed)    C-reactive protein (Completed)    Sedimentation Rate (Completed)    Obstructive sleep apnea syndrome       Other    Alpha-1-antitrypsin deficiency carrier    Burkholderia gladioli culture positive      Other Visit Diagnoses     Polycythemia        Relevant Orders    CBC w/ Differential (Completed)    6 - Minute walk (Completed)        Encouraged Brett Hanna and his wife to call [facility] homecare and request an appointment to get his new CPAP machine.  If this has not occurred by the time he is admitted to the hospital, I would prefer that he remain in the hospital using one of our CPAP machines until he receives a replacement.  No changes were made to his airway clearance regimen nor his medications.  He will continue his chronic Bactrim and azithromycin.    Patient was unable to expectorate sputum today to be sent for bacterial and AFB cultures.  If he has future exacerbations, sputum should be sent for bacterial and AFB cultures. The bacterial culture should be processed like a cystic fibrosis sample given the overlapping pathogens. There are standing orders for these cultures in our system.    Labs were drawn today including CBC with differential, IgG with subclasses, IgM, CRP, and sed rate to further evaluate his current presentation as well as his underlying causes of bronchiectasis.    Brett Hanna should receive the COVID-19 vaccine booster in January. He already received his flu vaccine.     Admission scheduled: 09/13/20    Reason for Admission: Bronchiectasis exacerbation in the setting of persistent Burkholderia gladioli positivity    Antibiotics: Based on prior culture data (Burkholderia gladioli) and allergy profile, favor treating with ceftazidime and tobramycin. Will need PICC line for 2 weeks of IV antibiotics.  Okay to continue Cayston if this is an on month.  Continue home Bactrim and azithromycin.  Other medications: Spiriva RespiMat and Advair - okay to formulary substitute    Airway clearance: Needs aggressive airway clearance with hypertonic saline 7% nebulized 4 times a day, pre-treatment with albuterol. For mechanical clearance, prefer Vibralung and/or Metaneb four times per day.  Would like to try him on nebulized Pulmozyme once a day based on the description of his sputum.  If he notes benefit, I will work on obtaining it for him as an outpatient. The inpatient team should not pursue this.    Consults: Pulmonary, VIR/PICC.    Labs: Lower respiratory culture and AFB sputum (for NTM).  Weekly CRP/ESR. Check ANCA on admission.    Imaging: Non-contrast chest CT    Other Testing: Overnight oximetry on home CPAP given his polycythemia.      Dispo: Patient does not need to remain in the hospital for the entire course of IV antibiotics. Patient does not need PFTs performed while in the hospital. Patient has a follow-up appointment with me already scheduled.    I am available to assist the primary team with his care during hospitalization.    Brett Hanna will return to clinic in 3 months for follow-up with pre-bronchodilator spirometry and sputum cultures.  He will call or send me a message via MyChart if questions or concerns arise before this visit.     The above plan was discussed with the patient and he is in agreement.    I personally spent 39 minutes face-to-face and non-face-to-face in the care of this patient, which includes all pre, intra, and post visit time on the date of service.     Subjective:      HPI: [name] is a 54 y.o. male who is seen for follow-up of bronchiectasis secondary to alpha-1 antitrypsin carrier state with prior NTM infection status post treatment, OSA, and Burkholderia gladioli colonization.    09/05/19:  I last saw him in clinic on 11/01/18. He reports having a terrible time with wheezing. Having more frequent coughing spells with more sputum volume. Better for few hours and then returns. If leans over to tie shoes, starts coughing and chokes on volume of mucus. Then can cough up blood during those spells. Describes hemoptysis as blood mixed with sputum (more than streaks but not frank blood). Can get up in morning and be okay some days. Other days gets coughing spells and then gets headache and can't do anything. Started Cipro yesterday; has taken two doses so far and thinks already helping this morning. Didn't feel like response to Augmentin was sustained.  Supposed to be starting Cayston today but is unsure if he has new supply (has to check with wife). Has another day worth of Colistin.  Tightness over RUQ comes and goes. If bends towards right, gets spasm. Supposed to get flu shot on Friday.  Has questions about whether face shield is just as effective as wearing a mask to prevent COVID infection. Has trouble tolerating wearing a mask while seated - has back pain.    01/09/20:  At his last visit, I treated him with Cipro for 2 weeks for newly cultured Burkholderia gladioli.  He feels like it helps somewhat but did not completely resolve his symptoms.  Subsequent culture in December was positive for B gladioli.  He had COVID-19 diagnosed 10/23/19 and received a monoclonal antibody therapy.  He tells me today that he does not think that the therapy was very helpful to him; his wife disagrees.  Unfortunately, Brett Hanna has been off of his inhaled  antibiotics due to high co-pay.  He feels that this is worsened his cough and breathing.  He is now having coughing attacks and notes more chest discomfort when coughing.  The coughing attacks started prior to discontinuation of inhaled antibiotics but have now worsened.  He is expectorating brown and somewhat bloody sputum although he is unsure if the blood in the sputum is coming from his sinuses since he has experienced more frequent nosebleeds recently.  He tolerated the Cipro very well aside from having some muscle stiffness in the morning.  Has been losing weight despite his appetite remaining robust.  He and his wife attribute this to his Jardiance.    04/02/20:  Sputum a little better but still there. Breathing a bit better. Over past 10 days, noticed a bit more.  Sputum is tan with rusty and green.  Not frank blood.  Yesterday, had coughing and wheezing all day.  Today is better.  Sputum comes up every time bends over.  Coughing spells not as bad or consistent as prior. When cough is looser, has raspy wheeze.  On Cayston right now. Awaiting Colistin. No F/C/NS. Just got new CPAP mask.  Walking for exercise - throughout the day.     06/28/20: Just started azitho. Cough improved but still there. Can cough up more sputum or have dry coughing. Can be short of breath at rest - gradually worsening since prior to his abx.  Has to pause when exerting himself. Coughs more if lays on left side but noticing on occasion if on right.  Change from inside to outside and starts coughing.  Sputum green to yellow. No hemoptysis. He continues to use his PAP device and finds it very beneficial. He sleeps much better when wearing it and feels that his breathing is also better as a result.      09/12/20:  Still waiting for new CPAP machine to be delivered.  [facility] contacted his wife 2 days ago and said that someone would call them to schedule an appointment for delivery but she has not heard anything yet.  Brett Hanna feels that the humidification in the CPAP is not working properly.  He is having to wake up at night to drink water because it is drying him out.  There is an alert on the machine notifying him that the motor life has expired.  It sounds louder than prior.  He is unsure if he is getting the same pressures as before and notes that he wakes up every 1-2 hours; these awakenings are in addition to waking up to drink water.    Lately, he feels like infection has built up in his lungs.  This is resulting in increased shortness of breath until he is able to expectorate it.  He feels that he does not have the same air capacity in his lungs as prior.  Mucus can be quite tenacious when he tries to expectorate it, noting that he will really have to rub to get it off of his shirt or the sink.  It is yellowish-tan in color without evidence of hemoptysis.  He feels that the chronic azithromycin has been the only new therapy that has provided a sustained improvement in his respiratory symptoms.  He feels that it attenuates them but does not fully resolve them.  He remains on his chronic Bactrim as well.  In August, he was treated with levofloxacin and had a little bit of improvement in his cough and breathing but symptoms returned about a  week after completing therapy.  Due to insurance limitations, we have been unable to get him inhaled colistin. Has been using Cayston every other month.    Past Medical History:   Diagnosis Date   ??? Allergic rhinitis    ??? Alpha-1-antitrypsin deficiency carrier     with slightly low levels   ??? Amblyopia    ??? Asthma    ??? Asthma, extrinsic    ??? Bronchiectasis (CMS-HCC) 06/12/2011   ??? Chronic sinusitis    ??? Colon polyps    ??? COVID-19 10/24/2019   ??? Diabetes mellitus (CMS-HCC)    ??? GERD (gastroesophageal reflux disease)    ??? H/O Mycobacterium avium complex infection    ??? Impaired glucose tolerance    ??? Obesity    ??? Pneumonia    ??? Pseudomonas respiratory infection    ??? 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 Sleep apnea    ??? Strabismus    ??? Strep throat        Past Surgical History:   Procedure Laterality Date   ??? BRONCHOSCOPY     ??? LUNG BIOPSY     ??? PR BRONCHOSCOPY,DIAGNOSTIC W BRUSH Bilateral 08/21/2013    Procedure: BRONCHOSCOPY, RIGID OR FLEXIBLE, INCLUDING FLOURO GUIDED; DIAGNOSTIC, WITH BRUSHING OR PROTECTED BRUSHINGS;  Surgeon: Nash Dimmer, MD;  Location: BRONCH PROCEDURE LAB [facility];  Service: Pulmonary   ??? PR BRONCHOSCOPY,DIAGNOSTIC W LAVAGE Bilateral 08/21/2013    Procedure: BRONCHOSCOPY, RIGID OR FLEXIBLE, INCLUDE FLUOROSCOPIC GUIDANCE WHEN PERFORMED; W/BRONCHIAL ALVEOLAR LAVAGE;  Surgeon: Nash Dimmer, MD;  Location: BRONCH PROCEDURE LAB [facility];  Service: Pulmonary   ??? PR COLONOSCOPY FLX DX W/COLLJ SPEC WHEN PFRMD N/A 03/21/2015    Procedure: COLONOSCOPY, FLEXIBLE, PROXIMAL TO SPLENIC FLEXURE; DIAGNOSTIC, W/WO COLLECTION SPECIMEN BY BRUSH OR WASH;  Surgeon: Brendia Sacks, MD;  Location: [facility]URES [facility];  Service: Gastroenterology   ??? SHOULDER SURGERY Right        Family History   Problem Relation Age of Onset   ??? Colon cancer Father    ??? Emphysema Maternal Uncle    ??? Heart disease Maternal Uncle    ??? Diabetes Paternal Grandfather        Social History     Tobacco Use   ??? Smoking status: Never Smoker   ??? Smokeless tobacco: Former Estate agent Use Topics   ??? Alcohol use: No     Alcohol/week: 0.0 standard drinks   ??? Drug use: No       Allergies  Reviewed on 09/12/2020      Reactions Comment    Codeine Shortness Of Breath, Rash     Rifampin  Fevers, chills.    Daliresp [roflumilast] Other (See Comments) Back pain          Current Outpatient Medications   Medication Sig Dispense Refill   ??? acetaminophen (TYLENOL EXTRA STRENGTH) 500 MG tablet Take 1 tablet by mouth as needed for pain     ??? albuterol 2.5 mg /3 mL (0.083 %) nebulizer solution Inhale the contents of 1 vial (3 mL) via nebulizer twice daily and every 6 hours as needed for shortness of breath and wheezing 360 mL 14   ??? albuterol HFA 90 mcg/actuation inhaler Inhale 2 puffs every four (4) hours as needed for wheezing 18 g 11   ??? atorvastatin (LIPITOR) 20 MG tablet Take 1 tablet (20 mg total) by mouth daily. 30 tablet 11   ??? azithromycin (ZITHROMAX) 250 MG tablet Take 1 tablet (250  mg total) by mouth daily. 30 tablet 11   ??? aztreonam lysine (CAYSTON) 75 mg/mL Nebu nebulization solution Inhale 1 mL (75 mg total) by nebulization every eight (8) hours. Nebulize every other month after airway clearance using Altera nebulizer 84 mL 5   ??? blood-glucose meter kit Check sugars once daily. 1 each 0   ??? cetirizine (ZYRTEC) 10 MG tablet Take 1 tablet (10 mg total) by mouth daily. 90 tablet 3   ??? cholecalciferol, vitamin D3, (VITAMIN D3) 2,000 unit cap Take 1 capsule (2,000 Units total) by mouth daily. 30 capsule prn   ??? colistimethate (COLYMYCIN) 150 mg injection Inject 2mL of sterile water for injection to mix Colistin Vial.Draw up 2mL (150mg ) mixed colistin & add 1mL SWFI for total volume=34mL. Inhale 3mL twice a day,every other month. 60 each 5   ??? empagliflozin (JARDIANCE) 25 mg tablet Take 1 tablet (25 mg total) by mouth daily. 90 tablet 3   ??? fexofenadine (ALLEGRA) 180 MG tablet Take 1 tablet (180 mg total) by mouth daily. (Patient taking differently: Take 180 mg by mouth daily. Alternates with zyrtec) 90 tablet 3   ??? fluticasone propion-salmeteroL (ADVAIR HFA) 230-21 mcg/actuation inhaler Inhale 2 puffs Two (2) times a day. (Patient taking differently: Inhale 2 puffs Two (2) times a day. Filling at [facility] 03/07/20) 36 g 3   ??? fluticasone propionate (FLONASE) 50 mcg/actuation nasal spray Use 2 sprays in each nostril route daily. 16 g 11   ??? lancets Misc 1 each by Other route once daily. Test daily before breakfast. Dispense 90 day supply. 100 each 3   ??? metFORMIN (GLUCOPHAGE-XR) 500 MG 24 hr tablet Take 4 tablets (2,000 mg total) by mouth daily with evening meal. 360 tablet 3   ??? MISCELLANEOUS MEDICAL SUPPLY MISC Frequency:PHARMDIR   Dosage:0.0     Instructions:  Note:CPAP 15 cm H20 with heated humidity for nasal dryness.  Mask: ResMed Quattro size Medium. Dx OSA. Dose: 1     ??? montelukast (SINGULAIR) 10 mg tablet Take 1 tablet (10 mg total) by mouth nightly. 90 tablet 3   ??? nebulizers Misc Use with inhaled medication. 3 each 3   ??? omeprazole (PRILOSEC) 20 MG capsule Take 1 capsule (20 mg total) by mouth two (2) times a day. 180 capsule 3   ??? sod bicarb-sod chlor-neti pot (AYR SALINE NASAL NETI RINSE) pkdv Use twice per day     ??? sodium chloride 7% 7 % Nebu Inhale 4 mL by nebulization Two (2) times a day. 720 mL 3   ??? sterile water, PF, Soln Use 2mL to mix colistin, then add 1mL sterile water for injection into neb cup along with colistin.Total volume 3mL 600 mL 5   ??? sulfamethoxazole-trimethoprim (BACTRIM) 400-80 mg per tablet Take 1 tablet (80 mg of trimethoprim total) by mouth Two (2) times a day. 180 tablet 3   ??? syringe with needle (BD LUER-LOK SYRINGE) 3 mL 20 gauge x 1 Syrg Use as directed with inhaled antibiotics. 60 each 5   ??? tiotropium bromide (SPIRIVA RESPIMAT) 2.5 mcg/actuation inhalation mist Inhale 2 puffs daily. 4 g 11   ??? topiramate (TOPAMAX) 100 MG tablet Take 0.5 tablets (50 mg total) by mouth nightly. 90 tablet 3   ??? sertraline (ZOLOFT) 50 MG tablet Take 1 tablet by mouth once daily 90 tablet 2     No current facility-administered medications for this visit.     Physical Exam:  BP 102/75  - Pulse 75  - Temp  36.6 ??C (97.9 ??F) (Temporal)  - Resp 18  - Ht 170.2 cm (5' 7)  - Wt 78.9 kg (174 lb)  - SpO2 91%  - BMI 27.25 kg/m??   Overweight white male appearing comfortable and in no acute distress. Easy work of breathing without accessory muscle use.  Coarse breath sounds with scattered wheezing and crackles, even without deep inspiration.  Speaking in full sentences easily.  Regular rate and rhythm with normal S1 and S2.  No murmurs, rubs, or gallops.      Diagnostic Review:   The following data were reviewed during this visit with key findings summarized below:    Pulmonary Function Testing: Spirometry obtained today demonstrates severe airway obstruction.  Reduction in FVC may reflect air trapping versus moderate restriction.    ?? FVC (% predicted) FEV1 (% predicted) FEV1/FVC   05/12/13 3.48 L (80%) 2.92 L (85%) 84%   06/26/14 2.84 L (65%) 2.29 L (67%) 81%   03/25/17 2.95 L (67%) 2.44 L (71%) 83%   06/29/17 2.68 L (62%) 2.34 L (70%) 87%   11/23/17 2.62 L (61%) 2.20 L (66%) 84%   03/08/18 2.59 L (60%) 2.06 L (62%) 80%   06/21/18 2.76 L (64%) 2.04 L (62%) 74%   11/01/18 2.63 L (61%) 2.08 L (63%) 79%   01/09/20 2.29 L (55%) 1.84 L (55%) 80%   04/02/20 2.27 L (54%) 1.91 L (58%) 84%   09/12/20 2.11 L (51%) 1.54 L (47%) 73%     Completed 6-minute walk test today.  Ambulated 366 m.  Starting oxygen saturation of 97%.  Nadir oxygen saturation of 91% at minutes 4 and minutes 6.  Did not require supplemental oxygen during the walk test.    Cultures:  ?? Source Bacterial Culture AFB Smear AFB Culture   03/25/17 Sputum 3+ OPF; 3+ Stenotrophomonas negative negative   06/29/17 Sputum OPF negative Overgrown with bacteria/fungus   01/28/18 Sputum 2+ OPF; 2+ GNR glucose non-fermenter; Scedosporium negative 2 morphologies Streptomyces   02/01/18 Sputum 3+ OPF; 1+ GNR glucose non-fermenter; Scedosporium negative Streptomyces   03/02/18 Sputum 3+ OPF; 3+ GNR glucose non-fermenter; Scedosporium negative Streptomyces   08/07/19 Sputum 3+ OPF; 3+ B gladioli negative Negative   10/17/19 Sputum 2+ OPF; 4+ B gladioli negative Overgrown   01/09/20 Sputum 2+ OPF; 4+ B gladioli negative Overgrown   04/25/20 Sputum 4+ B gladioli negative Overgrown   06/28/20 Sputum 3+ B gladioli negative Overgrown   ??      Imaging  Chest CT (03/18/18): Images personally reviewed. Bronchial wall thickening and bronchiectasis primarily involving the airways supplying the lingula and lung bases with associated clustered micronodular and tree-in-bud opacities. With respect to 02/02/2018, there is increased opacification at the deep posterior costophrenic recesses with more volume loss particularly at the left base, favored to represent atelectasis.  ??  Chest CT (02/02/18): Images personally reviewed. Bronchiectasis predominantly in bases of lingula, right middle lobe, and bilateral lower lobes (left greater than right), mild to moderately progressed since 03/30/2017. ?? New and increased nodular and tree-in-bud opacities, now involving all lobes. Patchy consolidations in the bases of the lingula, and lower lobes also stable to mildly increased. Also new left upper lobe nodular opacity with adjacent groundglass, 8 mm, (2:26). No definite reticular opacities, or honeycombing identified. No appreciable air trapping on expiratory imaging.  ??  Chest CT (03/30/17): Images personally reviewed. Interval worsening of bilateral lower lobe bronchiectasis and bronchial wall thickening with new lingular bronchiectasis. Increased clustered groundglass opacities with  areas of consolidation in the lower lobes and lingula. Findings may represent sequela of endobronchial infection or impaired mucus clearance.  Bronchiectasis has been present on CT imaging since 06/12/11.    Cardiac:   EKG (06/28/20): HR 66 with QTc . Normal sinus rhythm.    Portions of this record have been created using Scientist, clinical (histocompatibility and immunogenetics). Dictation errors have been sought, but may not have been identified and corrected.    Viona Gilmore, MD  09/12/20  11:32 AM

## 2020-09-12 NOTE — Unmapped (Signed)
Planning admission for IV antibiotics to treat Burkholderia.     Call [facility] to get your CPAP.  Seeing if you need oxygen when walking.    Lab work today.        Symptoms of a bronchiectasis exacerbation: 48 hours of at least three of the following:  ?? Increased cough  ?? Change in volume or appearance of sputum  ?? Increased sputum purulence  ?? Worsening shortness of breath and/or exercise tolerance  ?? Fatigue and/or malaise  ?? Coughing up blood (hemoptysis)    If you are experiencing some of these symptoms:  ?? Increase the frequency and/or intensity of your airway clearance  ?? Try to submit a sputum sample for bacterial and AFB cultures (at [facility] or locally)  ?? Reach out to me or your local physician.  ?? If you need antibiotics, recommend treating for 14 days.      Please bring any new airway clearance equipment to your next visit to ensure that you are using and caring for it properly.    Information about the COVID-19 vaccines: yourshot.org    Interested in participating in COVID-19 vaccine trials? https://www.coronaviruspreventionnetwork.org    Thank you for allowing me to be a part of your care. Please call the clinic with any questions.    Viona Gilmore, MD, MPH  Pulmonary and Critical Care Medicine  [address]  [id number]  [place], [place] [address]    Thank you for your visit to the [facility]. You may receive a survey from [facility] regarding your visit today, and we are eager to use this feedback to improve your experience. Thank you for taking the time to fill it out.    Between appointments, you can reach [place] at these numbers:  ??? For appointments: [phone]  ??? For my nurse, Arsenio Loader RN: [phone]  ??? Fax: [phone]  ??? For urgent issues after hours: Hospital Operator @ [phone] & ask for Pulmonary Fellow on call    For further information, check out the websites below:    [facility] for Bronchiectasis: [email or web address]    Impact Airway Clearance Education: http://www.impact-be.com    Videos from the patient session of the World Bronchiectasis Conference in [place] on May 15, 2017 can be watched here (TrustyNews.es).     Bronchiectasis Toolbox (information about airway clearance): DiningCalendar.de    Copy for Individuals with Bronchiectasis and/or NTM through the Bronchiectasis Registry and the COPD Foundation: https://www.mckee-young.org/    Information about Non-tuberculous Mycobacterial Infections:  https://www.ntminfo.org     Interested in clinical trials and other research opportunities?  www.clinicaltrials.gov    The Rare Diseases Clinical Research Network [facility]) Genetic Disorders of  Mucociliary Clearance Consortium [facility]) Contact Registry is a way for patients with disorders of mucociliary clearance (such as bronchiectasis) and their family members to learn about research studies they may be able to join. Participation is completely voluntary and you may choose to withdraw at any time. There is no cost to join the [employer]. For more information or to join the registry please go to the following website:  [email or web address]

## 2020-09-13 NOTE — Unmapped (Signed)
Spoke to [name]. She stated they will be admitted today.    Reviewed admission plan and let her know that most likley he will be transitioned to outpatient IV therapy.     I told her to plan for them to stay for at least 3 days. He will need a line access as well.     [name] will stay in touch with me prior to discharge.

## 2020-09-15 LAB — COMPREHENSIVE METABOLIC PANEL
ALBUMIN: 4.1 g/dL (ref 3.4–5.0)
ALKALINE PHOSPHATASE: 103 U/L (ref 46–116)
ALT (SGPT): 28 U/L (ref 10–49)
ANION GAP: 7 mmol/L (ref 5–14)
AST (SGOT): 25 U/L (ref <=34)
BILIRUBIN TOTAL: 0.6 mg/dL (ref 0.3–1.2)
BLOOD UREA NITROGEN: 18 mg/dL (ref 9–23)
BUN / CREAT RATIO: 17
CALCIUM: 9.6 mg/dL (ref 8.7–10.4)
CHLORIDE: 108 mmol/L — ABNORMAL HIGH (ref 98–107)
CO2: 22.6 mmol/L (ref 20.0–31.0)
CREATININE: 1.05 mg/dL
EGFR CKD-EPI AA MALE: >90 mL/min/{1.73_m2} (ref >=60)
EGFR CKD-EPI NON-AA MALE: 80 mL/min/{1.73_m2} (ref >=60)
GLUCOSE RANDOM: 129 mg/dL (ref 70–179)
POTASSIUM: 4.1 mmol/L (ref 3.4–4.5)
PROTEIN TOTAL: 7.6 g/dL (ref 5.7–8.2)
SODIUM: 138 mmol/L (ref 135–145)

## 2020-09-15 LAB — CBC
HEMATOCRIT: 49.2 % (ref 38.0–50.0)
HEMOGLOBIN: 15.9 g/dL (ref 13.5–17.5)
MEAN CORPUSCULAR HEMOGLOBIN CONC: 32.4 g/dL (ref 30.0–36.0)
MEAN CORPUSCULAR HEMOGLOBIN: 27.6 pg (ref 26.0–34.0)
MEAN CORPUSCULAR VOLUME: 85.2 fL (ref 81.0–95.0)
MEAN PLATELET VOLUME: 8.2 fL (ref 7.0–10.0)
PLATELET COUNT: 280 10*9/L (ref 150–450)
RED BLOOD CELL COUNT: 5.78 10*12/L — ABNORMAL HIGH (ref 4.32–5.72)
RED CELL DISTRIBUTION WIDTH: 16.2 % — ABNORMAL HIGH (ref 12.0–15.0)
WBC ADJUSTED: 8 10*9/L (ref 3.5–10.5)

## 2020-09-15 LAB — C-REACTIVE PROTEIN: C-REACTIVE PROTEIN: <4 mg/L (ref <=10.0)

## 2020-09-16 ENCOUNTER — Ambulatory Visit
Admit: 2020-09-16 | Discharge: 2020-09-18 | Disposition: A | Discharge status: Home Health | Payer: PRIVATE HEALTH INSURANCE

## 2020-09-16 ENCOUNTER — Ambulatory Visit
Admit: 2020-09-16 | Discharge: 2020-09-18 | Disposition: A | Discharge status: Home Health | Payer: PRIVATE HEALTH INSURANCE | Attending: Internal Medicine

## 2020-09-16 DIAGNOSIS — R0602 Shortness of breath: Principal | ICD-10-CM

## 2020-09-16 DIAGNOSIS — Z87891 Personal history of nicotine dependence: Principal | ICD-10-CM

## 2020-09-16 DIAGNOSIS — Z6830 Body mass index (BMI) 30.0-30.9, adult: Principal | ICD-10-CM

## 2020-09-16 DIAGNOSIS — Z825 Family history of asthma and other chronic lower respiratory diseases: Principal | ICD-10-CM

## 2020-09-16 DIAGNOSIS — Z79899 Other long term (current) drug therapy: Principal | ICD-10-CM

## 2020-09-16 DIAGNOSIS — Z86010 Personal history of colonic polyps: Principal | ICD-10-CM

## 2020-09-16 DIAGNOSIS — Z888 Allergy status to other drugs, medicaments and biological substances status: Principal | ICD-10-CM

## 2020-09-16 DIAGNOSIS — E785 Hyperlipidemia, unspecified: Principal | ICD-10-CM

## 2020-09-16 DIAGNOSIS — Z8701 Personal history of pneumonia (recurrent): Principal | ICD-10-CM

## 2020-09-16 DIAGNOSIS — E8801 Alpha-1-antitrypsin deficiency: Principal | ICD-10-CM

## 2020-09-16 DIAGNOSIS — G4733 Obstructive sleep apnea (adult) (pediatric): Principal | ICD-10-CM

## 2020-09-16 DIAGNOSIS — E119 Type 2 diabetes mellitus without complications: Principal | ICD-10-CM

## 2020-09-16 DIAGNOSIS — Z885 Allergy status to narcotic agent status: Principal | ICD-10-CM

## 2020-09-16 DIAGNOSIS — Z7984 Long term (current) use of oral hypoglycemic drugs: Principal | ICD-10-CM

## 2020-09-16 DIAGNOSIS — J471 Bronchiectasis with (acute) exacerbation: Principal | ICD-10-CM

## 2020-09-16 DIAGNOSIS — J45909 Unspecified asthma, uncomplicated: Principal | ICD-10-CM

## 2020-09-16 DIAGNOSIS — E669 Obesity, unspecified: Principal | ICD-10-CM

## 2020-09-16 DIAGNOSIS — Z79891 Long term (current) use of opiate analgesic: Principal | ICD-10-CM

## 2020-09-16 DIAGNOSIS — Z8616 Personal history of COVID-19: Principal | ICD-10-CM

## 2020-09-16 DIAGNOSIS — Z20822 Contact with and (suspected) exposure to covid-19: Principal | ICD-10-CM

## 2020-09-16 DIAGNOSIS — Z8669 Personal history of other diseases of the nervous system and sense organs: Principal | ICD-10-CM

## 2020-09-16 DIAGNOSIS — Z792 Long term (current) use of antibiotics: Principal | ICD-10-CM

## 2020-09-16 LAB — ANTI-NEUTROPHILIC CYTOPLASMIC ANTIBODY
ANCA IFA: POSITIVE — AB
MPO-ELISA: NEGATIVE
MPO-QUANT: 1.7 U/mL (ref <21.0)
PR3 ELISA: NEGATIVE
PR3-QUANT: 2.5 U/mL (ref <21.0)

## 2020-09-16 LAB — IGG 1, 2, 3, AND 4
IGG 1: 730 mg/dL
IGG 2: 103 mg/dL — ABNORMAL LOW
IGG 3: 89.5 mg/dL
IGG 4: 2.6 mg/dL
IGG,TOTAL (MAYO): 1180 mg/dL

## 2020-09-16 LAB — TOBRAMYCIN LEVEL, RANDOM: TOBRAMYCIN RANDOM: 1.8 ug/mL

## 2020-09-16 LAB — TOBRAMYCIN LEVEL, PEAK: TOBRAMYCIN PEAK: 13.7 ug/mL (ref 4.0–10.0)

## 2020-09-16 LAB — SEDIMENTATION RATE, MANUAL: ERYTHROCYTE SEDIMENTATION RATE: 3 mm/h (ref 0–20)

## 2020-09-16 MED ADMIN — sodium chloride 7% nebulizer solution 4 mL: 4 mL | RESPIRATORY_TRACT | @ 16:00:00 | Stop: 2020-09-18

## 2020-09-16 MED ADMIN — sodium chloride 7% nebulizer solution 4 mL: 4 mL | RESPIRATORY_TRACT | @ 20:00:00 | Stop: 2020-09-18

## 2020-09-16 MED ADMIN — albuterol 2.5 mg /3 mL (0.083 %) nebulizer solution 2.5 mg: 2.5 mg | RESPIRATORY_TRACT | @ 05:00:00 | Stop: 2020-09-18

## 2020-09-16 MED ADMIN — cefTAZidime (FORTAZ) 2 g in sodium chloride 0.9 % (NS) 100 mL IVPB-MBP: 2 g | INTRAVENOUS | @ 04:00:00 | Stop: 2020-09-18

## 2020-09-16 MED ADMIN — cefTAZidime (FORTAZ) 2 g in sodium chloride 0.9 % (NS) 100 mL IVPB-MBP: 2 g | INTRAVENOUS | @ 19:00:00 | Stop: 2020-09-18

## 2020-09-16 MED ADMIN — enoxaparin (LOVENOX) syringe 40 mg: 40 mg | SUBCUTANEOUS | @ 04:00:00 | Stop: 2020-09-18

## 2020-09-16 MED ADMIN — cetirizine (ZyrTEC) tablet 10 mg: 10 mg | ORAL | @ 13:00:00 | Stop: 2020-09-18

## 2020-09-16 MED ADMIN — dornase alfa (PULMOZYME) 1 mg/mL solution 2.5 mg: 2.5 mg | RESPIRATORY_TRACT | @ 13:00:00 | Stop: 2020-09-18

## 2020-09-16 MED ADMIN — cefTAZidime (FORTAZ) 2 g in sodium chloride 0.9 % (NS) 100 mL IVPB-MBP: 2 g | INTRAVENOUS | @ 12:00:00 | Stop: 2020-09-18

## 2020-09-16 MED ADMIN — cholecalciferol (vitamin D3 25 mcg (1,000 units)) tablet 25 mcg: 25 ug | ORAL | @ 13:00:00 | Stop: 2020-09-18

## 2020-09-16 MED ADMIN — tobramycin (NEBCIN) 500 mg in sodium chloride (NS) 0.9 % 100 mL IVPB: 500 mg | INTRAVENOUS | @ 14:00:00 | Stop: 2020-09-18

## 2020-09-16 MED ADMIN — sodium chloride 7% nebulizer solution 4 mL: 4 mL | RESPIRATORY_TRACT | @ 13:00:00 | Stop: 2020-09-18

## 2020-09-16 MED ADMIN — sodium chloride 7% nebulizer solution 4 mL: 4 mL | RESPIRATORY_TRACT | @ 05:00:00 | Stop: 2020-09-18

## 2020-09-16 MED ADMIN — montelukast (SINGULAIR) tablet 10 mg: 10 mg | ORAL | @ 04:00:00 | Stop: 2020-09-18

## 2020-09-16 MED ADMIN — atorvastatin (LIPITOR) tablet 20 mg: 20 mg | ORAL | @ 13:00:00 | Stop: 2020-09-18

## 2020-09-16 MED ADMIN — sulfamethoxazole-trimethoprim (BACTRIM) 400-80 mg tablet 80 mg of trimethoprim: 1 | ORAL | @ 14:00:00 | Stop: 2020-09-18

## 2020-09-16 MED ADMIN — empagliflozin (JARDIANCE) tablet 25 mg: 25 mg | ORAL | @ 14:00:00 | Stop: 2020-09-18

## 2020-09-16 MED ADMIN — loratadine (CLARITIN) tablet 10 mg: 10 mg | ORAL | @ 14:00:00 | Stop: 2020-09-18

## 2020-09-16 MED ADMIN — umeclidinium (INCRUSE ELLIPTA) 62.5 mcg/actuation inhaler 1 puff: 1 | RESPIRATORY_TRACT | @ 12:00:00 | Stop: 2020-09-18

## 2020-09-16 MED ADMIN — azithromycin (ZITHROMAX) tablet 250 mg: 250 mg | ORAL | @ 14:00:00 | Stop: 2020-09-18

## 2020-09-16 MED ADMIN — pantoprazole (PROTONIX) EC tablet 20 mg: 20 mg | ORAL | @ 13:00:00 | Stop: 2020-09-18

## 2020-09-16 MED ADMIN — sulfamethoxazole-trimethoprim (BACTRIM) 400-80 mg tablet 80 mg of trimethoprim: 1 | ORAL | @ 04:00:00 | Stop: 2020-09-18

## 2020-09-16 MED ADMIN — fluticasone furoate-vilanteroL (BREO ELLIPTA) 200-25 mcg/dose inhaler 1 puff: 1 | RESPIRATORY_TRACT | @ 12:00:00 | Stop: 2020-09-18

## 2020-09-16 MED ADMIN — fluticasone propionate (FLONASE) 50 mcg/actuation nasal spray 2 spray: 2 | NASAL | @ 13:00:00 | Stop: 2020-09-18

## 2020-09-16 NOTE — Unmapped (Addendum)
Patient arrived from home ambulatory and in NAD.  He is alert and oriented x 4.  He is accompanied by his wife.  Patient and wife are pleasant and he is independent in his personal care.    Patient initially placed on special airborne isolation until COVID testing was negative.  Currently observing contact isolation for bronchiectasis.  Patient and family oriented to unit and use of call light while under special airborne and contact isolation precautions.  Patient and family verbalized understanding of contact isolation precautions.      MD paged to bedside for admission evaluation.    Patient VSS on room air.  Occasionally productive cough.  Lungs sounds with crackles that are improved with coughing.  He does endorse SOB after coughing and with exertion.  O2 sat 88-89% in AM after laying down.  He states he is usually more SOB after laying flat or sitting a chair with a straight back.  He states the SOB is usually better when standing.  MD updated regarding O2 sat 88-89% on RA and improving to 91-94% on 3L via [place].  Will add humidified O2 as patient states he gets nose bleeds easily from dry air.  Awaiting sputum sample from patient.      Chest CT, labs, and EKG completed overnight.  CPAP set up by RT.  Left forearm 20 gauge PIV placed for initial IV therapy.  IV and PO medications administered as ordered.   Blood glucose monitored and did not require treatment with insulin overnight.      Rest promoted as much as possible.    Problem: Adult Inpatient Plan of Care  Goal: Plan of Care Review  Outcome: Ongoing - Unchanged  Goal: Patient-Specific Goal (Individualized)  Outcome: Ongoing - Unchanged  Goal: Absence of Hospital-Acquired Illness or Injury  Outcome: Ongoing - Unchanged  Intervention: Identify and Manage Fall Risk  Recent Flowsheet Documentation  Taken 09/15/2020 2200 by Hansel Starling, RN  Safety Interventions:  ??? environmental modification  ??? fall reduction program maintained  ??? family at bedside  ??? infection management  ??? isolation precautions  ??? low bed  ??? nonskid shoes/slippers when out of bed  ??? room near unit station  Intervention: Prevent Skin Injury  Recent Flowsheet Documentation  Taken 09/15/2020 2200 by Hansel Starling, RN  Skin Protection:  ??? adhesive use limited  ??? cleansing with dimethicone incontinence wipes  ??? incontinence pads utilized  Intervention: Prevent and Manage VTE (Venous Thromboembolism) Risk  Recent Flowsheet Documentation  Taken 09/15/2020 2200 by Hansel Starling, RN  Activity Management: activity adjusted per tolerance  Intervention: Prevent Infection  Recent Flowsheet Documentation  Taken 09/15/2020 2200 by Hansel Starling, RN  Infection Prevention:  ??? environmental surveillance performed  ??? cohorting utilized  ??? hand hygiene promoted  ??? rest/sleep promoted  ??? single patient room provided  ??? visitors restricted/screened  Goal: Optimal Comfort and Wellbeing  Outcome: Ongoing - Unchanged  Goal: Readiness for Transition of Care  Outcome: Ongoing - Unchanged  Goal: Rounds/Family Conference  Outcome: Ongoing - Unchanged     Problem: Infection  Goal: Absence of Infection Signs and Symptoms  Outcome: Ongoing - Unchanged  Intervention: Prevent or Manage Infection  Recent Flowsheet Documentation  Taken 09/15/2020 2200 by Hansel Starling, RN  Infection Management: aseptic technique maintained  Isolation Precautions: spec airborne/contact precautions initiated

## 2020-09-16 NOTE — Unmapped (Signed)
Care Management  Initial Transition Planning Assessment     CM initial assessment completed telephonically as a precautionary measure in accordance with [facility] COVID-19 Pandemic emergency response plan. Patient verbalized understanding and agreement with completing this assessment by telephone.     Patient has been identified as a Runner, broadcasting/film/video.  Requested that a  timely follow-up appointment be scheduled and documented prior to discharge.  Longitudinal Plan of Care reviewed, the following information is noted for continuity of care:  Per EMR, pt is a 54 y.o. male with PMHx of bronchiectasis secondary to alpha-1 antitrypsin carrier state??with prior NTM infection status post treatment, OSA, and Burkholderia gladioli colonization, hyperlipidemia, type 2 diabetes who presents as a direct admission from pulmonary clinic for bronchiectasis exacerbation.    CM attempted to see pt, pt in procedure getting PICC line. CM called pt later  and was able to talk to him. Pt lives w/ his wife. Pt report his independent and has CPAP , chest vest, nebulizer at home. Wife is his primary support. Pt drove self here. CM discussed about possible HI and pt was agreeable to do it. Pt said he has no preference for HI agency.       Medical Provider(s): Danielle Dess, MD  Primary Insurance- Payor: MEDICARE / Plan: MEDICARE PART A AND PART B / Product Type: *No Product type* /   Secondary Insurance ??? None  Prescription Coverage ???   Preferred Pharmacy - [facility]  [facility] - [place], [place] - [address]  [id number] - [place], [place] - [address]  [facility]  At this time the patient is able to make  his own decisions.  Transportation home: Private vehicle  Level of function prior to admission: Independent    Reason for Admission: Admitting Diagnosis:  Bronchiectasis    Previous admit date: N/A          General  Care Manager assessed the patient by : Telephone conversation with patient,Medical record review,Discussion with Clinical Care team  Orientation Level: Oriented X4  Functional level prior to admission: Independent  Reason for referral: Discharge Planning    Contact/Decision Maker  Extended Emergency Contact Information  Primary Emergency Contact: Georganna Skeans [place]  Home Phone: [phone]  Mobile Phone: [phone]  Relation: Spouse    Legal Next of Kin / Guardian / POA / Advance Directives     HCDM (patient stated preference): Sande Brothers - Spouse - [phone]    Advance Directive (Medical Treatment)  Does patient have an advance directive covering medical treatment?: Patient does not have advance directive covering medical treatment.  Reason patient does not have an advance directive covering medical treatment:: Patient does not wish to complete one at this time.    Health Care Decision Maker [HCDM] (Medical & Mental Health Treatment)  Healthcare Decision Maker: HCDM documented in the HCDM/Contact Info section.  Information offered on HCDM, Medical & Mental Health advance directives:: Patient declined information.    Advance Directive (Mental Health Treatment)  Does patient have an advance directive covering mental health treatment?: Patient does not have advance directive covering mental health treatment.  Reason patient does not have an advance directive covering mental health treatment:: HCDM documented in the HCDM/Contact Info section.    Patient Information  Lives with: Spouse/significant other  Pt phone#: [phone] (cell)   Type of Residence: Private residence   Location/Detail: [address] [place]  [place] [address]  Support Systems/Concerns: Spouse  Responsibilities/Dependents at home?: No  Home Care services in place prior to admission?: No   Outpatient/Community Resources in place prior to admission: Clinic  Agency detail (Name/Phone #): Darnelle Spangle PCP  Equipment Currently Used at Home: respiratory supplies,other (see comments) (Chest vest CPAP and nebulizer)   Currently receiving outpatient dialysis?: No     Financial Information     Need for financial assistance?: No     Social Determinants of Health  Social Determinants of Health     Tobacco Use: Medium Risk   ??? Smoking Tobacco Use: Never Smoker   ??? Smokeless Tobacco Use: Former Neurosurgeon   Alcohol Use: Not on Programmer, applications Strain: Low Risk    ??? Difficulty of Paying Living Expenses: Not very hard   Food Insecurity: No Food Insecurity   ??? Worried About Running Out of Food in the Last Year: Never true   ??? Ran Out of Food in the Last Year: Never true   Transportation Needs: No Transportation Needs   ??? Lack of Transportation (Medical): No   ??? Lack of Transportation (Non-Medical): No   Physical Activity: Not on file   Stress: Not on file   Social Connections: Not on file   Intimate Partner Violence: Not on file   Depression: Not on file   Housing/Utilities: Low Risk    ??? Within the past 12 months, have you ever stayed: outside, in a car, in a tent, in an overnight shelter, or temporarily in someone else's home (i.e. couch-surfing)?: No   ??? Are you worried about losing your housing?: No   ??? Within the past 12 months, have you been unable to get utilities (heat, electricity) when it was really needed?: No   Substance Use: Not on file   Health Literacy: Not on file       Discharge Needs Assessment  Concerns to be Addressed: discharge planning,adjustment to diagnosis/illness    Clinical Risk Factors: New Diagnosis,Multiple Diagnoses (Chronic)  Barriers to taking medications: No  Prior overnight hospital stay or ED visit in last 90 days: No  Readmission Within the Last 30 Days: no previous admission in last 30 days  Anticipated Changes Related to Illness: none  Equipment Needed After Discharge: none    Discharge Facility/Level of Care Needs: other (see comments) (Home w/ HI)    Readmission  Risk of Unplanned Readmission Score: UNPLANNED READMISSION SCORE: 12%  Predictive Model Details          12% (Medium)  Factor Value    Calculated 09/16/2020 08:04 53% Number of active Rx orders 56    Hanson Risk of Unplanned Readmission Model 13% ECG/EKG order present in last 6 months     9% Imaging order present in last 6 months     8% Charlson Comorbidity Index 5     6% Age 50     6% Active anticoagulant Rx order present     3% Future appointment scheduled     1% Active ulcer medication Rx order present     1% Current length of stay 0.501 days      Readmitted Within the Last 30 Days? (No if blank)   Patient at risk for readmission?: Yes    Discharge Plan  Screen findings are: Discharge planning needs identified or anticipated (Comment). (Pt possibly will need HI)    Expected Discharge Date: TBD    Expected Transfer from Critical Care:  NA    Quality data  for continuing care services shared with patient and/or representative?: Yes  Patient and/or family were provided with choice of facilities / services that are available and appropriate to meet post hospital care needs?: Yes   List choices in order highest to lowest preferred, if applicable. : Pt stated no preference for agency    Initial Assessment complete?: Yes

## 2020-09-16 NOTE — Unmapped (Signed)
Aminoglycoside Therapeutic Monitoring Pharmacy Note    Brett Hanna is a 54 y.o. male starting tobramycin. Date of therapy initiation: 09/15/20    Indication: bronchiectasis    Prior Dosing Information: None/new initiation     Goals:  Therapeutic Drug Levels  ?? Trough level: tobramycin <1 mg/L  ?? Peak level: 18-25mg /mL    Additional Clinical Monitoring/Outcomes  Renal function, volume status (intake and output)    Results:   ?? Not applicable   ?? Not applicable    Wt Readings from Last 1 Encounters:   09/15/20 80.3 kg (177 lb)     Lab Results   Component Value Date    CREATININE 0.76 08/20/2020       Pharmacokinetic Considerations and Significant Drug Interactions:  ??? Adult (estimated initial): Vd = 22.3 L, ke = 0.3 hr-1  ??? Concurrent nephrotoxic meds: Not applicable    Assessment/Plan:  Recommendation(s)  ??? Start 500mg  Q24 (high dose extended interval NEW initiation)  ??? Estimated peak and trough on recommended regimen: peak = ~20 mg/L, trough = <1 mg/L    Follow-up  ??? Level due: at 2 hrs and 8 hrs after first or second dose  ??? A pharmacist will continue to monitor and order levels as appropriate    Please page service pharmacist with questions/clarifications.    Doneta Public, PharmD, BCPS

## 2020-09-16 NOTE — Unmapped (Signed)
Hospitalist Daily Progress Note     LOS: 1 day       Assessment/Plan:  Active Problems:    Alpha-1-antitrypsin deficiency carrier    Bronchiectasis with (acute) exacerbation (CMS-HCC)    Obstructive sleep apnea syndrome    Cough  Resolved Problems:    * No resolved hospital problems. *         37.  54 year old male with history of alpha 1 antitrypsin deficiency and bronchiectasis admitted to the hospital for exacerbation of his lung disease.  -Pulmonary consultation, antibiotic regimen in place, PICC line placed this morning, continue tobramycin, appreciate pharmacy consult for management.  Continue home inhaler regimen    2.  Type 2 diabetes mellitus, on Jardiance at home, continue, sliding scale, regular diet, patient to make appropriate choices.  Last A1c on 08/20/2020 6.5.    3.  Hyperlipidemia, continue atorvastatin.    4.  GERD, continue PPI.    *DVT prophylaxis: Subcutaneous Lovenox    *Disposition: He will require at least 2 midnights before going home, duration of IV antibiotic therapy to be determined.    Please page the [facility]) pager at [phone] with questions.      Consultants:   1.  Pulmonology      Subjective:   His breathing feels a little better.  Still with paroxysms of cough, harsh, minimally productive.  No complaint of fever, chills.    Objective:       Vital signs in last 24 hours:  Temp:  [36.2 ??C (97.2 ??F)-36.3 ??C (97.3 ??F)] 36.2 ??C (97.2 ??F)  Heart Rate:  [73-81] 81  Resp:  [18-28] 28  BP: (115-142)/(73-84) 115/73  MAP (mmHg):  [102-105] 105  FiO2 (%):  [25 %] 25 %  SpO2:  [91 %-94 %] 94 %  BMI (Calculated):  [30.37] 30.37    Intake/Output last 24 hours:    Intake/Output Summary (Last 24 hours) at 09/16/2020 1341  Last data filed at 09/16/2020 1032  Gross per 24 hour   Intake 1000 ml   Output 1 ml   Net 999 ml         Physical Exam:    Gen: Alert, NAD    HEENT: No icterus.    CV: Regular rate and rhythm.    PULM/chest: Scattered fine rhonchi and some wheezing, coughing paroxysm occurs with deep breath.    AVW:UJWJ, Non tender, No palpable organomegaly    Ext: No C/C/E    Neuro: No acute focal deficits.    Skin: Warm, dry.      Medications:   Scheduled Meds:  ??? atorvastatin  20 mg Oral Daily   ??? azithromycin  250 mg Oral Daily   ??? cefTAZidime  2 g Intravenous Q8H   ??? cetirizine  10 mg Oral Daily   ??? cholecalciferol (vitamin D3 25 mcg (1,000 units))  25 mcg Oral Daily   ??? dornase alfa  2.5 mg Inhalation Daily (RT)   ??? empagliflozin  25 mg Oral Daily   ??? enoxaparin (LOVENOX) injection  40 mg Subcutaneous Q24H   ??? fluticasone furoate-vilanteroL  1 puff Inhalation Daily (RT)   ??? fluticasone propionate  2 spray Each Nare Daily   ??? insulin lispro  1-20 Units Subcutaneous ACHS   ??? loratadine  10 mg Oral Daily   ??? montelukast  10 mg Oral Nightly   ??? pantoprazole  20 mg Oral Daily   ??? sodium chloride 7%  4 mL Nebulization 4x Daily (RT)   ???  sulfamethoxazole-trimethoprim  1 tablet Oral BID   ??? tobramycin (NEBCIN) IVPB (conventional dosing)  500 mg Intravenous Q24H   ??? umeclidinium  1 puff Inhalation Daily (RT)     Continuous Infusions:    Lab Results   Component Value Date    WBC 8.0 09/15/2020    HGB 15.9 09/15/2020    HCT 49.2 09/15/2020    PLT 280 09/15/2020       Lab Results   Component Value Date    NA 138 09/15/2020    K 4.1 09/15/2020    CL 108 (H) 09/15/2020    CO2 22.6 09/15/2020    BUN 18 09/15/2020    CREATININE 1.05 09/15/2020    GLU 129 09/15/2020    CALCIUM 9.6 09/15/2020    MG 2.2 01/06/2013    PHOS 3.3 01/06/2013       Lab Results   Component Value Date    BILITOT 0.6 09/15/2020    BILIDIR 0.30 04/04/2018    PROT 7.6 09/15/2020    ALBUMIN 4.1 09/15/2020    ALT 28 09/15/2020    AST 25 09/15/2020    ALKPHOS 103 09/15/2020    GGT 41 01/06/2013       Lab Results   Component Value Date    PT 11.1 08/20/2020    INR 0.95 08/20/2020    APTT 31.2 06/28/2012     Pending Labs     Order Current Status    Anti-neutrophilic Cytoplasmic Antibody (ANCA) In process    Lower Respiratory Culture Preliminary result        35 minutes, over 50% spent in counseling and coordination of care.  Tawni Levy MD

## 2020-09-16 NOTE — Unmapped (Signed)
[facility] Medicine   History and Physical    Assessment/Plan:    Active Problems:    Bronchiectasis with (acute) exacerbation (CMS-HCC)      Brett Simpler Sr. is a 54 y.o. male with PMHx of bronchiectasis secondary to alpha-1 antitrypsin carrier state with prior NTM infection status post treatment, OSA, and Burkholderia gladioli colonization, hyperlipidemia, type 2 diabetes who presents as a direct admission from pulmonary clinic for bronchiectasis exacerbation.    Bronchiectasis exacerbation  -As evidenced by increasing productive cough, more profound shortness of breath than usual  -Following pulmonary recommendations including starting patient on ceftazidime and tobramycin.  We will continue home Bactrim and azithromycin  -7% nebulized hypertonic saline every 6 hours  -Mechanical clearance with MetaNeb  -Nebulized Pulmozyme once a day  -Continuing home inhalers of Spiriva and Advair and antihistamines  -Pick line ordered for 2 weeks of antibiotics  -Daily CBC and CMP.  Weekly ESR and CRP.  Labs are ordered and pending at time of this note  -Noncontrast chest CT ordered per pulmonary  -CPAP ordered for nighttime    Diabetes Type 2   - cont home SGLT2   - holding home metformin   - insulin SSI     Hyperlipidemia -continue home atorvastatin      Code Status:  Full Code    ___________________________________________________________________    Chief Complaint  No chief complaint on file.  Increased shortness of breath    HPI:  Brett Simpler Sr. is a 54 y.o. male with PMHx of bronchiectasis secondary to alpha-1 antitrypsin carrier state with prior NTM infection status post treatment, OSA, and Burkholderia gladioli colonization, hyperlipidemia, type 2 diabetes who presents as a direct admission from pulmonary clinic for bronchiectasis exacerbation.    Patient states he has been having increased sputum production and increased shortness of breath with moving around.  However over the past several days he has been having little mucus production but still feels like his lungs are full of mucus.  When he does cough up mucus it is gray or yellowish in color, without hemoptysis.  He is continuing to take all of his medications as prescribed including his azithromycin and Bactrim.      Allergies:  Codeine, Rifampin, and Daliresp [roflumilast]     Medications:   Prior to Admission medications    Medication Dose, Route, Frequency   acetaminophen (TYLENOL EXTRA STRENGTH) 500 MG tablet Take 1 tablet by mouth as needed for pain   albuterol 2.5 mg /3 mL (0.083 %) nebulizer solution Inhale the contents of 1 vial (3 mL) via nebulizer twice daily and every 6 hours as needed for shortness of breath and wheezing   albuterol HFA 90 mcg/actuation inhaler Inhale 2 puffs every four (4) hours as needed for wheezing   atorvastatin (LIPITOR) 20 MG tablet 20 mg, Oral, Daily (standard)   azithromycin (ZITHROMAX) 250 MG tablet 250 mg, Oral, Daily (standard)   aztreonam lysine (CAYSTON) 75 mg/mL Nebu nebulization solution 75 mg, Nebulization, Every 8 hours, Nebulize every other month after airway clearance using Altera nebulizer   blood-glucose meter kit Check sugars once daily.   cetirizine (ZYRTEC) 10 MG tablet 10 mg, Oral, Daily   cholecalciferol, vitamin D3, (VITAMIN D3) 2,000 unit cap 2,000 Units, Oral, Daily (Fat)   colistimethate (COLYMYCIN) 150 mg injection Inject 2mL of sterile water for injection to mix Colistin Vial.Draw up 2mL (150mg ) mixed colistin & add 1mL SWFI for total volume=24mL. Inhale 3mL twice a day,every other month.  empagliflozin (JARDIANCE) 25 mg tablet 25 mg, Oral, Daily (standard)   fexofenadine (ALLEGRA) 180 MG tablet 180 mg, Oral, Daily (standard)  Patient taking differently: Take 180 mg by mouth daily. Alternates with zyrtec   fluticasone propion-salmeteroL (ADVAIR HFA) 230-21 mcg/actuation inhaler 2 puffs, Inhalation, 2 times a day (standard)  Patient taking differently: Inhale 2 puffs Two (2) times a day. Filling at [facility] 03/07/20   fluticasone propionate (FLONASE) 50 mcg/actuation nasal spray Use 2 sprays in each nostril route daily.   lancets Misc 1 each, Other, Daily (RT), Test daily before breakfast. Dispense 90 day supply.   metFORMIN (GLUCOPHAGE-XR) 500 MG 24 hr tablet 2,000 mg, Oral, Daily   MISCELLANEOUS MEDICAL SUPPLY MISC Frequency:PHARMDIR   Dosage:0.0     Instructions:  Note:CPAP 15 cm H20 with heated humidity for nasal dryness.  Mask: ResMed Quattro size Medium. Dx OSA. Dose: 1   montelukast (SINGULAIR) 10 mg tablet 10 mg, Oral, Nightly   nebulizers Misc Use with inhaled medication.   omeprazole (PRILOSEC) 20 MG capsule 20 mg, Oral, 2 times a day   sertraline (ZOLOFT) 50 MG tablet Take 1 tablet by mouth once daily   sod bicarb-sod chlor-neti pot (AYR SALINE NASAL NETI RINSE) pkdv Use twice per day   sodium chloride 7% 7 % Nebu 4 mL, Nebulization, 2 times a day (standard)   sterile water, PF, Soln Use 2mL to mix colistin, then add 1mL sterile water for injection into neb cup along with colistin.Total volume 3mL   sulfamethoxazole-trimethoprim (BACTRIM) 400-80 mg per tablet 1 tablet, Oral, 2 times a day (standard)   syringe with needle (BD LUER-LOK SYRINGE) 3 mL 20 gauge x 1 Syrg Use as directed with inhaled antibiotics.   tiotropium bromide (SPIRIVA RESPIMAT) 2.5 mcg/actuation inhalation mist 2 puffs, Inhalation, Daily (standard)   topiramate (TOPAMAX) 100 MG tablet 50 mg, Oral, Nightly       Medical History:  Past Medical History:   Diagnosis Date    Allergic rhinitis     Alpha-1-antitrypsin deficiency carrier     with slightly low levels    Amblyopia     Asthma     Asthma, extrinsic     Bronchiectasis (CMS-HCC) 06/12/2011    Chronic sinusitis     Colon polyps     COVID-19 10/24/2019    Diabetes mellitus (CMS-HCC)     GERD (gastroesophageal reflux disease)     H/O Mycobacterium avium complex infection     Impaired glucose tolerance     Obesity     Pneumonia     Pseudomonas respiratory infection 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    Sleep apnea     Strabismus     Strep throat        Surgical History:  Past Surgical History:   Procedure Laterality Date    BRONCHOSCOPY      LUNG BIOPSY      PR BRONCHOSCOPY,DIAGNOSTIC W BRUSH Bilateral 08/21/2013    Procedure: BRONCHOSCOPY, RIGID OR FLEXIBLE, INCLUDING FLOURO GUIDED; DIAGNOSTIC, WITH BRUSHING OR PROTECTED BRUSHINGS;  Surgeon: Nash Dimmer, MD;  Location: [facility] PROC[facility] LAB [facility];  Service: Pulmonary    PR BRONCHOSCOPY,DIAGNOSTIC W LAVAGE Bilateral 08/21/2013    Procedure: BRONCHOSCOPY, RIGID OR FLEXIBLE, INCLUDE FLUOROSCOPIC GUIDANCE WHEN PERFORMED; W/BRONCHIAL ALVEOLAR LAVAGE;  Surgeon: Nash Dimmer, MD;  Location: [facility];  Service: Pulmonary    PR COLONOSCOPY FLX DX W/COLLJ SPEC WHEN PFRMD N/A 03/21/2015    Procedure: COLONOSCOPY, FLEXIBLE, PROXIMAL TO SPLENIC FLEXURE; DIAGNOSTIC, W/WO COLLECTION  SPECIMEN BY BRUSH OR WASH;  Surgeon: Brendia Sacks, MD;  Location: [facility];  Service: Gastroenterology    SHOULDER SURGERY Right        Social History:  Social History     Social History Narrative    Hasn't yet required services for disability. 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     Social History     Tobacco Use    Smoking status: Never Smoker    Smokeless tobacco: Former Estate agent Use Topics    Alcohol use: No     Alcohol/week: 0.0 standard drinks    Drug use: No       Family History:  Family History   Problem Relation Age of Onset    Colon cancer Father     Emphysema Maternal Uncle     Heart disease Maternal Uncle     Diabetes Paternal Grandfather        Review of Systems:  10 systems reviewed and are negative unless otherwise mentioned in HPI      Physical Exam:  Temp:  [36.2 ??C] 36.2 ??C  Heart Rate:  [74] 74  Resp:  [18] 18  BP: (142)/(75) 142/75  SpO2:  [91 %] 91 %  Body mass index is 30.38 kg/m??.    General: NAD, pleasant  HEENT: atraumatic, normocephalic, anicteric sclera   RESP: Extremely coarse breath sounds bilaterally in all lung fields  CV: RRR no MRG, normal S1 and S2   GI: soft, nontender, nondistended   Lower extremities: warm, well perfused, no peripheral edema   Neuro: no focal abnormalities, moving all extremities without difficulty       Test Results:  Data Review:  Lab results are pending.    Imaging: Pending    EKG: Pending    I reviewed the Fellows  note and I was available.  I agree with the  Fellows  findings and plan, updated as clinically indicated.   Griffith Citron, MD

## 2020-09-16 NOTE — Unmapped (Signed)
PULMONARY CONSULT  NOTE      Patient: Brett Simpler Sr.(Sep 07, 1966)  Reason for consultation: [name] is a 54 y.o. male who is seen in consultation at the request of Rica Koyanagi, MD for comprehensive evaluation of bronchiectasis with acute exacerbation.    Assessment and Recommendations:      Active Problems:    Bronchiectasis with (acute) exacerbation (CMS-HCC)  Resolved Problems:    * No resolved hospital problems. *    ?? Bronchiectasis with acute exacerbation  ?? A1-AT carrier  ?? Hx of NTM infection s/p treatment  ?? Worsening dyspnea  ?? Worsening cough  ?? OSA on CPAP    Recommendations:  - Antibiotics: Based on prior culture data (Burkholderia gladioli) and allergy profile,agree with treating with ceftazidime and tobramycin. Will need PICC line for 2 weeks of IV antibiotics.  Hold [place] as this is an off month.  Continue home Bactrim and azithromycin.  -  inhaled bronchodilators   - airway clearance with HT saline and Vest qid  - VIR consult for PICC appreciated  - follow up cultures  - CPAP qhs    We appreciate the opportunity to assist in the care of this patient.  Please page [phone] with any questions.    Carmelia Roller, MD    Subjective:      History of Present Illness:  [name] is a 55 y.o. male admitted for worsening cough and dyspnea over the last several weeks. The patient has a hx of bronchiectasis (A1AT carrier) with prior NTM infection s/p treatment, OSA, Burkholderia colonization who was seen in clinic on 09/12/20 and referred for inpatient admission and treatment of bronchiectasis exacerbation. The patient was treated with bactrim and levofloxacin in 8/21. He is on chronic Bactrim and azithromycin. The patient notes worsening productive cough (thick, green sputum) and associated shortness of breath with exertion over the last several weeks. He states he has not felt the same since Covid infection last year. He endorses hemoptysis in the past but none in last 3 months. He reports adherence to his airway clearance regimen. He notes his cough has been worse when mowing the lawn. He does not feel his last 2 courses of outpatient antibiotics have been effective in improving his cough.     Review of Systems: A comprehensive review of systems was performed and was negative except as above in HPI  Past Medical History:   Diagnosis Date   ??? Allergic rhinitis    ??? Alpha-1-antitrypsin deficiency carrier     with slightly low levels   ??? Amblyopia    ??? Asthma    ??? Asthma, extrinsic    ??? Bronchiectasis (CMS-HCC) 06/12/2011   ??? Chronic sinusitis    ??? Colon polyps    ??? COVID-19 10/24/2019   ??? Diabetes mellitus (CMS-HCC)    ??? GERD (gastroesophageal reflux disease)    ??? H/O Mycobacterium avium complex infection    ??? Impaired glucose tolerance    ??? Obesity    ??? Pneumonia    ??? Pseudomonas respiratory infection    ??? 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 Sleep apnea    ??? Strabismus    ??? Strep throat      Past Surgical History:   Procedure Laterality Date   ??? BRONCHOSCOPY     ??? LUNG BIOPSY     ??? PR BRONCHOSCOPY,DIAGNOSTIC W BRUSH Bilateral 08/21/2013    Procedure: BRONCHOSCOPY, RIGID OR FLEXIBLE, INCLUDING FLOURO GUIDED; DIAGNOSTIC, WITH BRUSHING OR PROTECTED BRUSHINGS;  Surgeon: Nash Dimmer, MD;  [facility]: [facility];  Service: Pulmonary   ??? PR BRONCHOSCOPY,DIAGNOSTIC W LAVAGE Bilateral 08/21/2013    Procedure: BRONCHOSCOPY, RIGID OR FLEXIBLE, INCLUDE FLUOROSCOPIC GUIDANCE WHEN PERFORMED; W/BRONCHIAL ALVEOLAR LAVAGE;  Surgeon: Nash Dimmer, MD;  Location: [facility] PROC[facility];  Service: Pulmonary   ??? PR COLONOSCOPY FLX DX W/COLLJ SPEC WHEN PFRMD N/A 03/21/2015    Procedure: COLONOSCOPY, FLEXIBLE, PROXIMAL TO SPLENIC FLEXURE; DIAGNOSTIC, W/WO COLLECTION SPECIMEN BY BRUSH OR WASH;  Surgeon: Brendia Sacks, MD;  Location: [facility];  Service: Gastroenterology   ??? SHOULDER SURGERY Right      Medications reviewed in Epic  Allergies as of 09/12/2020 - Reviewed 09/12/2020   Allergen Reaction Noted   ??? Codeine Shortness Of Breath and Rash 02/07/2013   ??? Rifampin  05/08/2016   ??? Daliresp [roflumilast] Other (See Comments) 06/26/2014     Family History   Problem Relation Age of Onset   ??? Colon cancer Father    ??? Emphysema Maternal Uncle    ??? Heart disease Maternal Uncle    ??? Diabetes Paternal Grandfather      Social History     Tobacco Use   ??? Smoking status: Never Smoker   ??? Smokeless tobacco: Former Estate agent Use Topics   ??? Alcohol use: No     Alcohol/week: 0.0 standard drinks        Objective:      Physical Exam:  Vitals:    09/15/20 2027 09/15/20 2357 09/16/20 0532 09/16/20 0600   BP: 142/75  139/84    Pulse: 74 74 73    Resp: 18 20 20     Temp: 36.2 ??C (97.2 ??F)  36.3 ??C (97.3 ??F)    TempSrc: Oral  Oral    SpO2: 91% 93% 94% 94%   Weight:       Height:           General appearance: mild respiratory distress, appears stated age, coughing during exam  HEENT: normal external ears, PERRL, conjunctiva clear, EOMI, NP clear, good dentition  Neck: supple, FROM  Resp: No crackles, bilateral rhonchi and wheeze, + accessory muscle use, symmetric chest expansion, no dullneess on percussion  CV: RRR, no m/g/r, No JVD, no peripheral edema  GI: soft ND ND, + BS  Musculoskeletal: joints normal without swelling, normal strength and muscle tone  Skin: no visible rashes or lesions  Lymph: no cervical, supraclavicular or submandibular LAD  Neuro: A and O, CN 2-12 grossly intact, motor and sensation intact; gait not assessed     Malnutrition Assessment by RD:          Diagnostic Review:   All labs and images were personally reviewed.    Chest CT 09/16/20: Clear central tracheobronchial tree. Interval increase in bronchial wall thickening and bronchiectasis involving the lingula and bilateral lung bases when compared to 03/18/2018. Interval worsening of bilateral nodular/tree-in-bud opacities. For reference: Right upper lobe nodule measuring 0.6 cm (2:28), right middle lobe nodule measuring 1.0 cm (2:53). No pleural effusion.

## 2020-09-16 NOTE — Unmapped (Addendum)
Mr Brett Hanna, Awake/alert/oreinted X 3, assessed, VSS, pt awaiting PICC line today, had breakfast denies any CP or SOB.  PICC team came to bedside and inseted double lumen in R. Upper arm.  Pt stable, tolerated procedure well.  Had late breakfast and lunch, BS within normal limits.  Wife at bedside. Continue to monitor pts condition. Tobramycin levels collected and sent to lab as ordered.  Problem: Adult Inpatient Plan of Care  Goal: Plan of Care Review  Outcome: Progressing  Goal: Patient-Specific Goal (Individualized)  Outcome: Progressing  Goal: Absence of Hospital-Acquired Illness or Injury  Outcome: Progressing  Intervention: Identify and Manage Fall Risk  Recent Flowsheet Documentation  Taken 09/16/2020 0800 by Auburn Bilberry, RN  Safety Interventions:  ??? environmental modification  ??? low bed  ??? fall reduction program maintained  ??? infection management  ??? family at bedside  Intervention: Prevent Skin Injury  Recent Flowsheet Documentation  Taken 09/16/2020 0800 by Auburn Bilberry, RN  Skin Protection: adhesive use limited  Intervention: Prevent and Manage VTE (Venous Thromboembolism) Risk  Recent Flowsheet Documentation  Taken 09/16/2020 0800 by Auburn Bilberry, RN  Activity Management:  ??? activity adjusted per tolerance  ??? activity encouraged  Intervention: Prevent Infection  Recent Flowsheet Documentation  Taken 09/16/2020 0800 by Auburn Bilberry, RN  Infection Prevention: environmental surveillance performed  Goal: Optimal Comfort and Wellbeing  Outcome: Progressing  Goal: Readiness for Transition of Care  Outcome: Progressing  Goal: Rounds/Family Conference  Outcome: Progressing     Problem: Infection  Goal: Absence of Infection Signs and Symptoms  Outcome: Progressing  Intervention: Prevent or Manage Infection  Recent Flowsheet Documentation  Taken 09/16/2020 0800 by Auburn Bilberry, RN  Infection Management: aseptic technique maintained  Isolation Precautions: spec airborne/contact precautions maintained

## 2020-09-16 NOTE — Unmapped (Signed)
PICC LINE INSERTION PROCEDURE NOTE    Indications:  Antibiotic Therapy    Consent/Time Out:    Risks, benefits and alternatives discussed with patient.  Written consent was obtained prior to the procedure and is detailed in the medical record.  Prior to the start of the procedure, a time out was taken and the identity of the patient was confirmed via name, medical record number and  date of birth.  The availability of the correct equipment was verified.    Procedure Details:  The vein was identified and measured for appropriate catheter length. Maximum sterile techniques was utilized.  Sterile field prepared with necessary supplies and equipment. Insertion site was prepped with chlorhexidine solution and allowed to dry. Lidocaine 3 mL Subcutaneous administered to insertion site.  The catheter was primed with normal saline.  A 5 FR Double lumen PICC inserted to the R Cephalic vein with 3 insertion attempt(s).  Catheter aspirated, 3 mL blood return present.  The catheter was then flushed with 20 mL of normal saline.   Insertion site cleansed, and sterile dressing applied per manufacturer guidelines. The Central Line Checklist was referenced.  The catheter was inserted without difficulty by Adah Salvage RN.    Findings:  Manufacturer:  BARD  Lot #:  [id number]  CT Injectable (power):  yes  Total catheter length:  0 cm.    Catheter left at:  40 cm.  Catheter trimmed:  yes  Port reserved:  Nurse discretion  EBL mL: 1    Vein Size:   Right arm cephalic vein compressible:  Yes, measurement:  5.34 mm  Upper Arm Circumference (cm): 37 cm       Tip Placement Verification:   [employer] and Sherlock    Workup Time:    60 minutes    Recommendations:  PICC Brochure given to patient with teaching instruction.

## 2020-09-17 LAB — CBC
HEMATOCRIT: 49.7 % (ref 38.0–50.0)
HEMOGLOBIN: 16.6 g/dL (ref 13.5–17.5)
MEAN CORPUSCULAR HEMOGLOBIN CONC: 33.3 g/dL (ref 30.0–36.0)
MEAN CORPUSCULAR HEMOGLOBIN: 28 pg (ref 26.0–34.0)
MEAN CORPUSCULAR VOLUME: 83.9 fL (ref 81.0–95.0)
MEAN PLATELET VOLUME: 7.7 fL (ref 7.0–10.0)
PLATELET COUNT: 238 10*9/L (ref 150–450)
RED BLOOD CELL COUNT: 5.92 10*12/L — ABNORMAL HIGH (ref 4.32–5.72)
RED CELL DISTRIBUTION WIDTH: 15.8 % — ABNORMAL HIGH (ref 12.0–15.0)
WBC ADJUSTED: 10.5 10*9/L (ref 3.5–10.5)

## 2020-09-17 LAB — COMPREHENSIVE METABOLIC PANEL
ALBUMIN: 3.9 g/dL (ref 3.4–5.0)
ALKALINE PHOSPHATASE: 108 U/L (ref 46–116)
ALT (SGPT): 22 U/L (ref 10–49)
ANION GAP: 6 mmol/L (ref 5–14)
AST (SGOT): 18 U/L (ref <=34)
BILIRUBIN TOTAL: 0.7 mg/dL (ref 0.3–1.2)
BLOOD UREA NITROGEN: 17 mg/dL (ref 9–23)
BUN / CREAT RATIO: 24
CALCIUM: 9.6 mg/dL (ref 8.7–10.4)
CHLORIDE: 106 mmol/L (ref 98–107)
CO2: 25.8 mmol/L (ref 20.0–31.0)
CREATININE: 0.72 mg/dL
EGFR CKD-EPI AA MALE: >90 mL/min/{1.73_m2} (ref >=60)
EGFR CKD-EPI NON-AA MALE: >90 mL/min/{1.73_m2} (ref >=60)
GLUCOSE RANDOM: 119 mg/dL (ref 70–179)
POTASSIUM: 4.1 mmol/L (ref 3.4–4.5)
PROTEIN TOTAL: 7.4 g/dL (ref 5.7–8.2)
SODIUM: 138 mmol/L (ref 135–145)

## 2020-09-17 LAB — MAGNESIUM: MAGNESIUM: 2.2 mg/dL (ref 1.6–2.6)

## 2020-09-17 MED ADMIN — cefTAZidime (FORTAZ) 2 g in sodium chloride 0.9 % (NS) 100 mL IVPB-MBP: 2 g | INTRAVENOUS | @ 02:00:00 | Stop: 2020-09-18

## 2020-09-17 MED ADMIN — loratadine (CLARITIN) tablet 10 mg: 10 mg | ORAL | @ 14:00:00 | Stop: 2020-09-18

## 2020-09-17 MED ADMIN — cefTAZidime (FORTAZ) 2 g in sodium chloride 0.9 % (NS) 100 mL IVPB-MBP: 2 g | INTRAVENOUS | @ 10:00:00 | Stop: 2020-09-18

## 2020-09-17 MED ADMIN — cholecalciferol (vitamin D3 25 mcg (1,000 units)) tablet 25 mcg: 25 ug | ORAL | @ 14:00:00 | Stop: 2020-09-18

## 2020-09-17 MED ADMIN — sulfamethoxazole-trimethoprim (BACTRIM) 400-80 mg tablet 80 mg of trimethoprim: 1 | ORAL | @ 14:00:00 | Stop: 2020-09-18

## 2020-09-17 MED ADMIN — dornase alfa (PULMOZYME) 1 mg/mL solution 2.5 mg: 2.5 mg | RESPIRATORY_TRACT | @ 13:00:00 | Stop: 2020-09-18

## 2020-09-17 MED ADMIN — umeclidinium (INCRUSE ELLIPTA) 62.5 mcg/actuation inhaler 1 puff: 1 | RESPIRATORY_TRACT | @ 12:00:00 | Stop: 2020-09-18

## 2020-09-17 MED ADMIN — cefTAZidime (FORTAZ) 2 g in sodium chloride 0.9 % (NS) 100 mL IVPB-MBP: 2 g | INTRAVENOUS | @ 18:00:00 | Stop: 2020-09-18

## 2020-09-17 MED ADMIN — pantoprazole (PROTONIX) EC tablet 20 mg: 20 mg | ORAL | @ 14:00:00 | Stop: 2020-09-18

## 2020-09-17 MED ADMIN — fluticasone propionate (FLONASE) 50 mcg/actuation nasal spray 2 spray: 2 | NASAL | @ 14:00:00 | Stop: 2020-09-18

## 2020-09-17 MED ADMIN — insulin lispro (HumaLOG) injection 1-20 Units: 1-20 [IU] | SUBCUTANEOUS | @ 18:00:00 | Stop: 2020-09-18

## 2020-09-17 MED ADMIN — sodium chloride 7% nebulizer solution 4 mL: 4 mL | RESPIRATORY_TRACT | @ 12:00:00 | Stop: 2020-09-18

## 2020-09-17 MED ADMIN — fluticasone furoate-vilanteroL (BREO ELLIPTA) 200-25 mcg/dose inhaler 1 puff: 1 | RESPIRATORY_TRACT | @ 12:00:00 | Stop: 2020-09-18

## 2020-09-17 MED ADMIN — insulin lispro (HumaLOG) injection 1-20 Units: 1-20 [IU] | SUBCUTANEOUS | @ 02:00:00 | Stop: 2020-09-18

## 2020-09-17 MED ADMIN — empagliflozin (JARDIANCE) tablet 25 mg: 25 mg | ORAL | @ 14:00:00 | Stop: 2020-09-18

## 2020-09-17 MED ADMIN — enoxaparin (LOVENOX) syringe 40 mg: 40 mg | SUBCUTANEOUS | @ 02:00:00 | Stop: 2020-09-18

## 2020-09-17 MED ADMIN — tobramycin (NEBCIN) 500 mg in sodium chloride (NS) 0.9 % 100 mL IVPB: 500 mg | INTRAVENOUS | @ 14:00:00 | Stop: 2020-09-18

## 2020-09-17 MED ADMIN — sodium chloride 7% nebulizer solution 4 mL: 4 mL | RESPIRATORY_TRACT | @ 16:00:00 | Stop: 2020-09-18

## 2020-09-17 MED ADMIN — azithromycin (ZITHROMAX) tablet 250 mg: 250 mg | ORAL | @ 14:00:00 | Stop: 2020-09-18

## 2020-09-17 MED ADMIN — cetirizine (ZyrTEC) tablet 10 mg: 10 mg | ORAL | @ 14:00:00 | Stop: 2020-09-18

## 2020-09-17 MED ADMIN — sodium chloride 7% nebulizer solution 4 mL: 4 mL | RESPIRATORY_TRACT | @ 20:00:00 | Stop: 2020-09-18

## 2020-09-17 MED ADMIN — atorvastatin (LIPITOR) tablet 20 mg: 20 mg | ORAL | @ 14:00:00 | Stop: 2020-09-18

## 2020-09-17 MED ADMIN — sulfamethoxazole-trimethoprim (BACTRIM) 400-80 mg tablet 80 mg of trimethoprim: 1 | ORAL | @ 02:00:00 | Stop: 2020-09-18

## 2020-09-17 MED ADMIN — montelukast (SINGULAIR) tablet 10 mg: 10 mg | ORAL | @ 02:00:00 | Stop: 2020-09-18

## 2020-09-17 MED ADMIN — sodium chloride 7% nebulizer solution 4 mL: 4 mL | RESPIRATORY_TRACT | @ 01:00:00 | Stop: 2020-09-18

## 2020-09-17 NOTE — Unmapped (Signed)
PULMONARY PROGRESS NOTE      Patient: Brett Simpler Sr.([birth year])  Reason for admission: No Principal Problem: There is no principal problem currently on the Problem List. Please update the Problem List and refresh.     Assessment and Recommendations:      Active Problems:    Alpha-1-antitrypsin deficiency carrier    Bronchiectasis with (acute) exacerbation (CMS-HCC)    Obstructive sleep apnea syndrome    Cough  Resolved Problems:    * No resolved hospital problems. *      ?? Bronchiectasis with acute exacerbation  ?? A1-AT carrier  ?? Hx of NTM infection s/p treatment  ?? Worsening dyspnea  ?? Worsening cough  ?? OSA on CPAP  ??  Recommendations:  - Antibiotics: Based on prior culture data (Burkholderia gladioli) and allergy profile, D2??ceftazidime??and tobramycin (pharm to assist with dosing). Will need PICC line for??2??weeks of IV antibiotics. ??Hold [place] as this is an off month. ??Continue home Bactrim and azithromycin.  - patient will need assistance with home healthcare needs to complete outpatient antibiotics  -  inhaled bronchodilators   - airway clearance with HT saline and Vest qid  - follow up cultures  - CPAP qhs      Please page [phone] with any questions.      Carmelia Roller, MD    Subjective:      Interval History (09/17/20)  Patient reports sputum is more productive. He feels less dyspnea after coughing up a large volume of sputum.        Objective:      Physical Exam:  Vitals:    09/16/20 1120 09/16/20 1943 09/17/20 0427 09/17/20 0800   BP: 115/73 138/71 113/71    Pulse: 81 76 78    Resp: 28 22 20     Temp: 36.2 ??C (97.2 ??F) 36 ??C (96.8 ??F) 35.7 ??C (96.3 ??F) 35.7 ??C (96.3 ??F)   TempSrc: Temporal Oral Axillary    SpO2: 94% 92% 93%    Weight:       Height:         General appearance: mild respiratory distress, appears stated age  HEENT: normal external ears, PERRL, conjunctiva clear, EOMI, NP clear, good dentition  Neck: supple, FROM  Resp: No crackles, bilateral rhonchi and wheeze, No accessory muscle use, symmetric chest expansion, no dullneess on percussion  CV: RRR, no m/g/r, No JVD, no peripheral edema  GI: soft ND ND, + BS  Musculoskeletal: joints normal without swelling, normal strength and muscle tone  Skin: no visible rashes or lesions  Lymph: no cervical, supraclavicular or submandibular LAD  Neuro: A and O, CN 2-12 grossly intact, motor and sensation intact; gait wnl      Malnutrition Assessment using AND/ASPEN Clinical Characteristics:            Patient Lines/Drains/Airways Status     Active Active Lines, Drains, & Airways     Name Placement date Placement time Site Days    Non-Surgical Airway Nasal Cannula 03/21/15  0926  ???  2007    PICC Double Lumen 09/16/20 Right Cephalic 09/16/20  1020  Cephalic  less than 1    Peripheral IV 09/15/20 Left;Posterior Forearm 09/15/20  2245  Forearm  1              Patient Lines/Drains/Airways Status     Active Wounds     None                   Diagnostic Review:  All labs and images were personally reviewed.    Chest CT 09/16/20: Clear central tracheobronchial tree. Interval increase in bronchial wall thickening and bronchiectasis involving the lingula and bilateral lung bases when compared to 03/18/2018. Interval worsening of bilateral nodular/tree-in-bud opacities. For reference: Right upper lobe nodule measuring 0.6 cm (2:28), right middle lobe nodule measuring 1.0 cm (2:53). No pleural effusion.

## 2020-09-17 NOTE — Unmapped (Signed)
Aminoglycoside Therapeutic Monitoring Pharmacy Note    Brett Hanna is a 54 y.o. male continuing tobramycin. Date of therapy initiation: 09/16/20    Indication: bronchiectasis    Prior Dosing Information: Current regimen 500mg  Q24H      Goals:  Therapeutic Drug Levels  ?? Trough level: tobramycin <1 mg/L  ?? Peak level: tobramycin 18-25 mg/L    Additional Clinical Monitoring/Outcomes  Renal function, volume status (intake and output)    Results:   ?? Trough level: 1.8 mg/L, drawn 14 hours and 1 minute prior to next infusion (Extrapolated trough: 0.046 mg/L)   ?? Peak level: 13.7 mg/L, drawn 1 hour and 13 minutes after end of infusion (Extrapolated Peak: 18.8 mg/L)    Wt Readings from Last 1 Encounters:   09/15/20 80.3 kg (177 lb)     Lab Results   Component Value Date    CREATININE 1.05 09/15/2020       Pharmacokinetic Considerations and Significant Drug Interactions:  ??? Adult (calculated on 09/16/20): Vd = 23.4 L, ke = 0.2613 hr-1  ??? Concurrent nephrotoxic meds: Bactrim    Assessment/Plan:  Recommendation(s)  ??? Continue current regimen of 500mg  Q24H.  ??? Estimated peak and trough on recommended regimen: peak = 18.8 mg/L, trough = 0.046 mg/L    Follow-up  ??? Level due: peak and trough levels around the fourth or fifth dose  ??? A pharmacist will continue to monitor and order levels as appropriate    Please page service pharmacist with questions/clarifications.    Mat Carne, PharmD Candidate   Tiburcio Pea, PharmD, BCPS

## 2020-09-17 NOTE — Unmapped (Signed)
Patient did well with doing all of their treatments today. Patient did their airway clearance without complications. Pt used the chest vest with afternoon tx tolerated very well. Pt agrees to continue current tx.

## 2020-09-17 NOTE — Unmapped (Signed)
POC discussed with patient. No falls noted during my shift thus far and I will continue to monitor patient. Independent to bathroom. Wife at the bedside. Contact precautions maintained.  IV antibiotics given per orders. PICC line flushed per protocol.     Problem: Adult Inpatient Plan of Care  Goal: Plan of Care Review  Outcome: Progressing  Goal: Patient-Specific Goal (Individualized)  Outcome: Progressing  Goal: Absence of Hospital-Acquired Illness or Injury  Outcome: Progressing  Intervention: Identify and Manage Fall Risk  Recent Flowsheet Documentation  Taken 09/17/2020 0000 by Wynn Maudlin, RN  Safety Interventions:   fall reduction program maintained   family at bedside   low bed   isolation precautions   nonskid shoes/slippers when out of bed  Taken 09/16/2020 2300 by Wynn Maudlin, RN  Safety Interventions:   fall reduction program maintained   isolation precautions   low bed   nonskid shoes/slippers when out of bed  Taken 09/16/2020 2200 by Wynn Maudlin, RN  Safety Interventions:   fall reduction program maintained   family at bedside   isolation precautions   low bed   nonskid shoes/slippers when out of bed  Taken 09/16/2020 2100 by Wynn Maudlin, RN  Safety Interventions:   fall reduction program maintained   family at bedside   low bed   nonskid shoes/slippers when out of bed   isolation precautions  Taken 09/16/2020 2000 by Wynn Maudlin, RN  Safety Interventions:   fall reduction program maintained   low bed   nonskid shoes/slippers when out of bed   isolation precautions  Taken 09/16/2020 1900 by Wynn Maudlin, RN  Safety Interventions:   fall reduction program maintained   family at bedside   low bed   nonskid shoes/slippers when out of bed   isolation precautions  Intervention: Prevent and Manage VTE (Venous Thromboembolism) Risk  Recent Flowsheet Documentation  Taken 09/16/2020 1900 by Wynn Maudlin, RN  Activity Management: activity adjusted per tolerance  Goal: Optimal Comfort and Wellbeing  Outcome: Progressing  Goal: Readiness for Transition of Care  Outcome: Progressing  Goal: Rounds/Family Conference  Outcome: Progressing     Problem: Infection  Goal: Absence of Infection Signs and Symptoms  Outcome: Progressing  Intervention: Prevent or Manage Infection  Recent Flowsheet Documentation  Taken 09/17/2020 0000 by Wynn Maudlin, RN  Isolation Precautions: contact precautions maintained  Taken 09/16/2020 2300 by Wynn Maudlin, RN  Isolation Precautions: contact precautions maintained  Taken 09/16/2020 2200 by Wynn Maudlin, RN  Isolation Precautions: contact precautions maintained  Taken 09/16/2020 2100 by Wynn Maudlin, RN  Isolation Precautions: contact precautions maintained  Taken 09/16/2020 2000 by Wynn Maudlin, RN  Isolation Precautions: contact precautions maintained  Taken 09/16/2020 1900 by Wynn Maudlin, RN  Isolation Precautions: contact precautions maintained     Problem: Diabetes Comorbidity  Goal: Blood Glucose Level Within Targeted Range  Outcome: Progressing     Problem: Obstructive Sleep Apnea Risk or Actual Comorbidity Management  Goal: Unobstructed Breathing During Sleep  Outcome: Progressing

## 2020-09-18 DIAGNOSIS — J471 Bronchiectasis with (acute) exacerbation: Principal | ICD-10-CM

## 2020-09-18 LAB — COMPREHENSIVE METABOLIC PANEL
ALBUMIN: 3.7 g/dL (ref 3.4–5.0)
ALKALINE PHOSPHATASE: 108 U/L (ref 46–116)
ALT (SGPT): 22 U/L (ref 10–49)
ANION GAP: 7 mmol/L (ref 5–14)
AST (SGOT): 16 U/L (ref <=34)
BILIRUBIN TOTAL: 0.6 mg/dL (ref 0.3–1.2)
BLOOD UREA NITROGEN: 12 mg/dL (ref 9–23)
BUN / CREAT RATIO: 16
CALCIUM: 9.5 mg/dL (ref 8.7–10.4)
CHLORIDE: 104 mmol/L (ref 98–107)
CO2: 27.3 mmol/L (ref 20.0–31.0)
CREATININE: 0.74 mg/dL
EGFR CKD-EPI AA MALE: >90 mL/min/{1.73_m2} (ref >=60)
EGFR CKD-EPI NON-AA MALE: >90 mL/min/{1.73_m2} (ref >=60)
GLUCOSE RANDOM: 121 mg/dL (ref 70–179)
POTASSIUM: 4.1 mmol/L (ref 3.4–4.5)
PROTEIN TOTAL: 7.2 g/dL (ref 5.7–8.2)
SODIUM: 138 mmol/L (ref 135–145)

## 2020-09-18 LAB — CBC
HEMATOCRIT: 48.3 % (ref 38.0–50.0)
HEMOGLOBIN: 15.8 g/dL (ref 13.5–17.5)
MEAN CORPUSCULAR HEMOGLOBIN CONC: 32.8 g/dL (ref 30.0–36.0)
MEAN CORPUSCULAR HEMOGLOBIN: 27.9 pg (ref 26.0–34.0)
MEAN CORPUSCULAR VOLUME: 85.2 fL (ref 81.0–95.0)
MEAN PLATELET VOLUME: 7.6 fL (ref 7.0–10.0)
PLATELET COUNT: 210 10*9/L (ref 150–450)
RED BLOOD CELL COUNT: 5.67 10*12/L (ref 4.32–5.72)
RED CELL DISTRIBUTION WIDTH: 15.7 % — ABNORMAL HIGH (ref 12.0–15.0)
WBC ADJUSTED: 9.6 10*9/L (ref 3.5–10.5)

## 2020-09-18 MED ORDER — TOBRAMYCIN IVPB IN 100 ML (STANDARD DOSING) (OVF)
INTRAVENOUS | 0 refills | 10 days | Status: CP
Start: 2020-09-18 — End: 2020-09-28

## 2020-09-18 MED ORDER — CEFTAZIDIME (FORTAZ) 2 G MINI-BAG PLUS
Freq: Three times a day (TID) | INTRAVENOUS | 0 refills | 10 days | Status: CP
Start: 2020-09-18 — End: 2020-09-28

## 2020-09-18 MED ADMIN — tobramycin (NEBCIN) 500 mg in sodium chloride (NS) 0.9 % 100 mL IVPB: 500 mg | INTRAVENOUS | @ 16:00:00 | Stop: 2020-09-18

## 2020-09-18 MED ADMIN — dornase alfa (PULMOZYME) 1 mg/mL solution 2.5 mg: 2.5 mg | RESPIRATORY_TRACT | @ 13:00:00 | Stop: 2020-09-18

## 2020-09-18 MED ADMIN — loratadine (CLARITIN) tablet 10 mg: 10 mg | ORAL | @ 16:00:00 | Stop: 2020-09-18

## 2020-09-18 MED ADMIN — fluticasone furoate-vilanteroL (BREO ELLIPTA) 200-25 mcg/dose inhaler 1 puff: 1 | RESPIRATORY_TRACT | @ 13:00:00 | Stop: 2020-09-18

## 2020-09-18 MED ADMIN — cefTAZidime (FORTAZ) 2 g in sodium chloride 0.9 % (NS) 100 mL IVPB-MBP: 2 g | INTRAVENOUS | @ 10:00:00 | Stop: 2020-09-18

## 2020-09-18 MED ADMIN — sodium chloride 7% nebulizer solution 4 mL: 4 mL | RESPIRATORY_TRACT | @ 16:00:00 | Stop: 2020-09-18

## 2020-09-18 MED ADMIN — fluticasone propionate (FLONASE) 50 mcg/actuation nasal spray 2 spray: 2 | NASAL | @ 16:00:00 | Stop: 2020-09-18

## 2020-09-18 MED ADMIN — sulfamethoxazole-trimethoprim (BACTRIM) 400-80 mg tablet 80 mg of trimethoprim: 1 | ORAL | @ 02:00:00 | Stop: 2020-09-18

## 2020-09-18 MED ADMIN — cefTAZidime (FORTAZ) 2 g in sodium chloride 0.9 % (NS) 100 mL IVPB-MBP: 2 g | INTRAVENOUS | @ 02:00:00 | Stop: 2020-09-18

## 2020-09-18 MED ADMIN — cholecalciferol (vitamin D3 25 mcg (1,000 units)) tablet 25 mcg: 25 ug | ORAL | @ 16:00:00 | Stop: 2020-09-18

## 2020-09-18 MED ADMIN — umeclidinium (INCRUSE ELLIPTA) 62.5 mcg/actuation inhaler 1 puff: 1 | RESPIRATORY_TRACT | @ 13:00:00 | Stop: 2020-09-18

## 2020-09-18 MED ADMIN — cefTAZidime (FORTAZ) 2 g in sodium chloride 0.9 % (NS) 100 mL IVPB-MBP: 2 g | INTRAVENOUS | @ 18:00:00 | Stop: 2020-09-18

## 2020-09-18 MED ADMIN — atorvastatin (LIPITOR) tablet 20 mg: 20 mg | ORAL | @ 16:00:00 | Stop: 2020-09-18

## 2020-09-18 MED ADMIN — pantoprazole (PROTONIX) EC tablet 20 mg: 20 mg | ORAL | @ 16:00:00 | Stop: 2020-09-18

## 2020-09-18 MED ADMIN — sodium chloride 7% nebulizer solution 4 mL: 4 mL | RESPIRATORY_TRACT | @ 13:00:00 | Stop: 2020-09-18

## 2020-09-18 MED ADMIN — sulfamethoxazole-trimethoprim (BACTRIM) 400-80 mg tablet 80 mg of trimethoprim: 1 | ORAL | @ 16:00:00 | Stop: 2020-09-18

## 2020-09-18 MED ADMIN — empagliflozin (JARDIANCE) tablet 25 mg: 25 mg | ORAL | @ 16:00:00 | Stop: 2020-09-18

## 2020-09-18 MED ADMIN — insulin lispro (HumaLOG) injection 1-20 Units: 1-20 [IU] | SUBCUTANEOUS | @ 02:00:00 | Stop: 2020-09-18

## 2020-09-18 MED ADMIN — sodium chloride 7% nebulizer solution 4 mL: 4 mL | RESPIRATORY_TRACT | @ 21:00:00 | Stop: 2020-09-18

## 2020-09-18 MED ADMIN — montelukast (SINGULAIR) tablet 10 mg: 10 mg | ORAL | @ 02:00:00 | Stop: 2020-09-18

## 2020-09-18 MED ADMIN — azithromycin (ZITHROMAX) tablet 250 mg: 250 mg | ORAL | @ 16:00:00 | Stop: 2020-09-18

## 2020-09-18 MED ADMIN — enoxaparin (LOVENOX) syringe 40 mg: 40 mg | SUBCUTANEOUS | @ 02:00:00 | Stop: 2020-09-18

## 2020-09-18 MED ADMIN — cetirizine (ZyrTEC) tablet 10 mg: 10 mg | ORAL | @ 16:00:00 | Stop: 2020-09-18

## 2020-09-18 MED ADMIN — sodium chloride 7% nebulizer solution 4 mL: 4 mL | RESPIRATORY_TRACT | @ 01:00:00 | Stop: 2020-09-18

## 2020-09-18 NOTE — Unmapped (Signed)
POC discussed with patient. No falls noted during my shift thus far and I will continue to monitor patient. IV antibiotics given per orders. PICC line flushed per protocol. Plan for possible discharge to home Wednesday.    Problem: Adult Inpatient Plan of Care  Goal: Plan of Care Review  Outcome: Progressing  Goal: Patient-Specific Goal (Individualized)  Outcome: Progressing  Goal: Absence of Hospital-Acquired Illness or Injury  Outcome: Progressing  Intervention: Identify and Manage Fall Risk  Recent Flowsheet Documentation  Taken 09/18/2020 0200 by Wynn Maudlin, RN  Safety Interventions:   fall reduction program maintained   low bed   nonskid shoes/slippers when out of bed   isolation precautions  Taken 09/18/2020 0000 by Wynn Maudlin, RN  Safety Interventions:   fall reduction program maintained   family at bedside   isolation precautions   low bed   nonskid shoes/slippers when out of bed  Taken 09/17/2020 2200 by Wynn Maudlin, RN  Safety Interventions:   family at bedside   fall reduction program maintained   isolation precautions   low bed   nonskid shoes/slippers when out of bed  Taken 09/17/2020 2000 by Wynn Maudlin, RN  Safety Interventions:   fall reduction program maintained   family at bedside   isolation precautions   low bed   nonskid shoes/slippers when out of bed  Intervention: Prevent and Manage VTE (Venous Thromboembolism) Risk  Recent Flowsheet Documentation  Taken 09/17/2020 2200 by Wynn Maudlin, RN  Activity Management: up ad lib  Taken 09/17/2020 2000 by Wynn Maudlin, RN  Activity Management: up in chair  Goal: Optimal Comfort and Wellbeing  Outcome: Progressing  Goal: Readiness for Transition of Care  Outcome: Progressing  Goal: Rounds/Family Conference  Outcome: Progressing     Problem: Infection  Goal: Absence of Infection Signs and Symptoms  Outcome: Progressing  Intervention: Prevent or Manage Infection  Recent Flowsheet Documentation  Taken 09/18/2020 0200 by Wynn Maudlin, RN  Isolation Precautions: contact precautions maintained  Taken 09/18/2020 0000 by Wynn Maudlin, RN  Isolation Precautions: contact precautions maintained  Taken 09/17/2020 2200 by Wynn Maudlin, RN  Isolation Precautions: contact precautions maintained  Taken 09/17/2020 2000 by Wynn Maudlin, RN  Isolation Precautions: contact precautions maintained     Problem: Diabetes Comorbidity  Goal: Blood Glucose Level Within Targeted Range  Outcome: Progressing     Problem: Obstructive Sleep Apnea Risk or Actual Comorbidity Management  Goal: Unobstructed Breathing During Sleep  Outcome: Progressing

## 2020-09-18 NOTE — Unmapped (Signed)
PULMONARY PROGRESS NOTE      Patient: Brett Simpler Sr.(1966/01/18)  Reason for admission: No Principal Problem: There is no principal problem currently on the Problem List. Please update the Problem List and refresh.     Assessment and Recommendations:      Active Problems:    Alpha-1-antitrypsin deficiency carrier    Bronchiectasis with (acute) exacerbation (CMS-HCC)    Obstructive sleep apnea syndrome    Cough  Resolved Problems:    * No resolved hospital problems. *      ?? Bronchiectasis with acute exacerbation- sputum cx  11/15 NGTD  ?? A1-AT carrier  ?? Hx of NTM infection s/p treatment  ?? Worsening dyspnea  ?? Worsening cough  ?? OSA on CPAP  ??  Recommendations:  - Antibiotics: Based on prior culture data (Burkholderia gladioli) and allergy profile, D3??ceftazidime??and tobramycin (pharm assisting with dosing appreciated). Plan for PICC line for??2??weeks of IV antibiotics. ??Hold [place] as this is an off month. ??Continue home Bactrim and azithromycin.  - patient will need assistance with home healthcare needs to complete outpatient antibiotics  -  inhaled bronchodilators   - airway clearance with HT saline and Vest qid  - follow up cultures- NGTD  - CPAP qhs      Please page [phone] with any questions.      Carmelia Roller, MD    Subjective:      Interval History (09/18/20)  Patient reports he feels a little worse today- cough is less productive.        Objective:      Physical Exam:  Vitals:    09/17/20 2028 09/18/20 0017 09/18/20 0513 09/18/20 0758   BP: 122/73  (P) 124/63    Pulse: 88 78 (P) 86 76   Resp: 21 25 (P) 19 18   Temp: 36 ??C (96.8 ??F)  36.1 ??C (97 ??F)    TempSrc: Oral  (P) Oral    SpO2: 91% 95% (P) 91% 93%   Weight:       Height:         General appearance: mild respiratory distress, appears stated age  HEENT: normal external ears, PERRL, conjunctiva clear, EOMI, NP clear, good dentition  Neck: supple, FROM  Resp: No crackles, scattered bilateral rhonchi and wheeze, No accessory muscle use, symmetric chest expansion, no dullneess on percussion  CV: RRR, no m/g/r, No JVD, no peripheral edema  GI: soft ND ND, + BS  Musculoskeletal: joints normal without swelling, normal strength and muscle tone  Skin: no visible rashes or lesions  Lymph: no cervical, supraclavicular or submandibular LAD  Neuro: A and O, CN 2-12 grossly intact, motor and sensation intact; gait wnl      Malnutrition Assessment using AND/ASPEN Clinical Characteristics:            Patient Lines/Drains/Airways Status     Active Active Lines, Drains, & Airways     Name Placement date Placement time Site Days    Non-Surgical Airway Nasal Cannula 03/21/15  0926  ???  2008    PICC Double Lumen 09/16/20 Right Cephalic 09/16/20  1020  Cephalic  1    Peripheral IV 09/15/20 Left;Posterior Forearm 09/15/20  2245  Forearm  2              Patient Lines/Drains/Airways Status     Active Wounds     None                   Diagnostic Review:  All labs and images were personally reviewed.    Chest CT 09/16/20: Clear central tracheobronchial tree. Interval increase in bronchial wall thickening and bronchiectasis involving the lingula and bilateral lung bases when compared to 03/18/2018. Interval worsening of bilateral nodular/tree-in-bud opacities. For reference: Right upper lobe nodule measuring 0.6 cm (2:28), right middle lobe nodule measuring 1.0 cm (2:53). No pleural effusion.

## 2020-09-18 NOTE — Unmapped (Signed)
Disciplines requested: Nursing and Home IV Antibiotics    Physician to follow patient's care (the person listed here will be responsible for signing ongoing orders): Rolm Baptise, MD    Inpatient Start Date of Antibiotics (Day 1): 09/16/20  Start of Care Date (discharge date): 09/18/20  Estimate completion of therapy:09/30/20  Home health/infusion please reach out to nurse contact (below) 2-3 days prior to end of therapy for appropriate orders    IV ANTIBIOTIC ORDERS:  ??? Tobramycin 500mg  IV every 24 hours (CF hi-dose, infuse over 60 minutes)-Please dose in the MORNING  ??? Ceftazidime 2g IV every 8 hours    **DO NOT DRAW DRUG LEVELS FROM PORT**    LAB MONITORING ORDERS:  Start labs on 09/19/20  ??? (Tobramycin CF Hi-dose Ext Interval and Ceftazidime/Cefepime/Aztreonam/Ceftaroline/Piperacillin-tazobactam): ONCE weekly: CBC with diff, tobramycin random level on Mondays (7 hrs after the END of infusion time); TWICE weekly: CMP, Mg. Please collect once weekly labs on Mondays and twice weekly labs on Mondays and Thursdays.    Collect on MONDAYS of each week:  ??? CBC + diff  ??? Tobramycin Random Level (collect 7-8 hrs after END of infusion time)    Collect on THURSDAYS of each week (only for patients on aminoglycosides)  ??? Magnesium  ??? Comprehensive Metabolic Panel     TARGET DRUG LEVELS:  ??? -Tobramycin (Bronchiectasis NON-CF Extended Interval Dosing): Target end of infusion peak: 18-25 mcg/mL, trough <1 mcg/mL    --------------------------------------------------------------------------------------  *PLEASE FAX RESULTS TO: [phone]    *PHONE CONTACT*:  ??? Non-CF Bronchiectasis Patients/Pulmonary NTM for (L. Daniels/K. Allena Katz): Nurse Coordinator-Paula Marinis, RN [phone]  ??? For critical lab results or urgent matters outside of regular business hours (Mon through Fri 8:00AM-4:30PM, please call the hospital operator at [phone] and ask to page the pulmonary fellow on-call.    **Please warm hand-off to outpatient team nurse coordinators per contact list above**

## 2020-09-18 NOTE — Unmapped (Signed)
Discharge Services and Resources:     Home Infusion:  You have been recommended for home infusion services for your antibiotic therapy. Your antibiotic therapy will be administered through a central line (PICC line) that was placed while you were in the hospital. The infusion company that has accepted your referral and will provide the antibiotics is [facility] with [facility] for nursing. If you have any questions or concerns please call the number listed below.    Infusion Agency: [facility] ??Phone: [phone]   Infusion Start of Care Date: 09/18/2020     Nursing Agency: [facility] Phone: [phone]      For questions regarding your CPAP, please follow up with [facility] at [phone]

## 2020-09-18 NOTE — Unmapped (Signed)
Pt with no falls or injuries this shift, and able to ambulate independently in room/halls this shift.  Bed low and locked, side rails up x 2, and call bell within reach.  Vitals WNL.  Pt receiving IV antibiotics per orders (and to discharge home today with PICC line).  Pt with no complaints of pain.  Pt tolerating diet.  Will continue to monitor.    Problem: Adult Inpatient Plan of Care  Goal: Plan of Care Review  Outcome: Progressing  Goal: Patient-Specific Goal (Individualized)  Outcome: Progressing  Goal: Absence of Hospital-Acquired Illness or Injury  Outcome: Progressing  Intervention: Identify and Manage Fall Risk  Recent Flowsheet Documentation  Taken 09/18/2020 0800 by Francena Hanly, RN  Safety Interventions:   fall reduction program maintained   lighting adjusted for tasks/safety   low bed   nonskid shoes/slippers when out of bed  Intervention: Prevent and Manage VTE (Venous Thromboembolism) Risk  Recent Flowsheet Documentation  Taken 09/18/2020 0800 by Francena Hanly, RN  Activity Management: up ad lib  Goal: Optimal Comfort and Wellbeing  Outcome: Progressing  Goal: Readiness for Transition of Care  Outcome: Progressing  Goal: Rounds/Family Conference  Outcome: Progressing     Problem: Infection  Goal: Absence of Infection Signs and Symptoms  Outcome: Progressing     Problem: Diabetes Comorbidity  Goal: Blood Glucose Level Within Targeted Range  Outcome: Progressing     Problem: Obstructive Sleep Apnea Risk or Actual Comorbidity Management  Goal: Unobstructed Breathing During Sleep  Outcome: Progressing

## 2020-09-18 NOTE — Unmapped (Signed)
Physician Discharge Summary HBR  2 DT HBR  [address]  [place] [address]  Dept: [phone]  Loc: [phone]     Identifying Information:   Brett Simpler Sr.  [birth year]  [id number]    Primary Care Physician: Danielle Dess, MD   Code Status: Full Code    Admit Date: 09/15/2020    Discharge Date: 09/18/2020     Discharge To: Home    Discharge Service: HBR - HBC: Hospitalist Service #2     Discharge Attending Physician: Kathryne Hitch, MD    Discharge Diagnoses:  Active Problems:    Alpha-1-antitrypsin deficiency carrier POA: Not Applicable    Bronchiectasis with (acute) exacerbation (CMS-HCC) POA: Yes    Obstructive sleep apnea syndrome POA: Yes    Cough POA: Yes  Resolved Problems:    * No resolved hospital problems. *      Outpatient Provider Follow Up Issues:   [ ]  Lab monitoring: CBC + diff, tobramycin random level each Monday. CMP and Mg each Thursday.   [ ]  Goal tobramycin level: Target end of infusion peak: 18-25 mcg/mL, trough <1 mcg/mL      Hospital Course:   The patient is a 54 year old Caucasian male with a history of bronchiectasis secondary to alpha-1 antitrypsin carrier state admitted to the hospital for acute exacerbation of bronchiectasis.  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    He was admitted to the hospital and was started on tobramycin and ceftazidime. He was seen in Pulmonary consultation for ongoing recommendations for antibiotic treatment in anticipation of completion of 2 weeks of intravenous antibiotics.  PICC line was placed without complication. He will continue his course of antibiotics as an outpatient, which will complete on 11/27. Repeat sputum culture was sent during admission and is pending at the time of discharge.         Procedures:  PICC line placement  ______________________________________________________________________  Discharge Medications:     Your Medication List      STOP taking these medications    colistimethate 150 mg injection  Commonly known as: COLYMYCIN        START taking these medications    cefTAZidime 2 g in sodium chloride 0.9 % 0.9 % 100 mL IVPB  Infuse 2 g into a venous catheter every eight (8) hours for 10 days.     tobramycin 500 mg, OVERFILL 10 mL in sodium chloride 0.9 % 100 mL IVPB  Infuse 500 mg into a venous catheter daily for 10 days.        CHANGE how you take these medications    ADVAIR HFA 230-21 mcg/actuation inhaler  Generic drug: fluticasone propion-salmeteroL  Inhale 2 puffs Two (2) times a day.  What changed: additional instructions     fexofenadine 180 MG tablet  Commonly known as: ALLEGRA  Take 1 tablet (180 mg total) by mouth daily.  What changed: additional instructions        CONTINUE taking these medications    albuterol 2.5 mg /3 mL (0.083 %) nebulizer solution  Inhale the contents of 1 vial (3 mL) via nebulizer twice daily and every 6 hours as needed for shortness of breath and wheezing     albuterol 90 mcg/actuation inhaler  Commonly known as: VENTOLIN HFA  Inhale 2 puffs every four (4) hours as needed for wheezing     atorvastatin 20 MG tablet  Commonly known as: LIPITOR  Take 1 tablet (20 mg total)  by mouth daily.     AYR SALINE NASAL NETI RINSE Pkdv  Generic drug: sod bicarb-sod chlor-neti pot  Use twice per day     azithromycin 250 MG tablet  Commonly known as: ZITHROMAX  Take 1 tablet (250 mg total) by mouth daily.     BD LUER-LOK SYRINGE 3 mL 20 gauge x 1 Syrg  Generic drug: syringe with needle  Use as directed with inhaled antibiotics.     blood-glucose meter kit  Check sugars once daily.     CAYSTON 75 mg/mL Nebu nebulization solution  Generic drug: aztreonam lysine  Inhale 1 mL (75 mg total) by nebulization every eight (8) hours. Nebulize every other month after airway clearance using Altera nebulizer     cetirizine 10 MG tablet  Commonly known as: ZyrTEC  Take 1 tablet (10 mg total) by mouth daily.     cholecalciferol (vitamin D3-50 mcg (2,000 unit)) 50 mcg (2,000 unit) Cap  Commonly known as: VITAMIN D3  Take 1 capsule (2,000 Units total) by mouth daily.     CULTURELLE PROBIOTICS ORAL  Take 1 capsule by mouth daily.     fluticasone propionate 50 mcg/actuation nasal spray  Commonly known as: FLONASE  Use 2 sprays in each nostril route daily.     JARDIANCE 25 mg tablet  Generic drug: empagliflozin  Take 1 tablet (25 mg total) by mouth daily.     lancets Misc  1 each by Other route once daily. Test daily before breakfast. Dispense 90 day supply.     LC PLUS Misc  Generic drug: nebulizers  Use with inhaled medication.     metFORMIN 500 MG 24 hr tablet  Commonly known as: GLUCOPHAGE-XR  Take 4 tablets (2,000 mg total) by mouth daily with evening meal.     MISCELLANEOUS MEDICAL SUPPLY MISC  Frequency:PHARMDIR   Dosage:0.0     Instructions:  Note:CPAP 15 cm H20 with heated humidity for nasal dryness.  Mask: ResMed Quattro size Medium. Dx OSA. Dose: 1     montelukast 10 mg tablet  Commonly known as: SINGULAIR  Take 1 tablet (10 mg total) by mouth nightly.     omeprazole 20 MG capsule  Commonly known as: PriLOSEC  Take 1 capsule (20 mg total) by mouth two (2) times a day.     sodium chloride 7% 7 % Nebu  Inhale 4 mL by nebulization Two (2) times a day.     SPIRIVA RESPIMAT 2.5 mcg/actuation inhalation mist  Generic drug: tiotropium bromide  Inhale 2 puffs daily.     sterile water (PF) Soln  Use 2mL to mix colistin, then add 1mL sterile water for injection into neb cup along with colistin.Total volume 3mL     sulfamethoxazole-trimethoprim 400-80 mg per tablet  Commonly known as: BACTRIM  Take 1 tablet (80 mg of trimethoprim total) by mouth Two (2) times a day.     TYLENOL EXTRA STRENGTH 500 MG tablet  Generic drug: acetaminophen  Take 1 tablet by mouth as needed for pain            Allergies:  Codeine, Rifampin, Bamlanivimab, and Daliresp [roflumilast]  ______________________________________________________________________  Pending Test Results (if blank, then none):  Pending Labs     Order Current Status    Lower Respiratory Culture Preliminary result        Most Recent Labs:  All lab results last 24 hours -   Recent Results (from the past 24 hour(s))   POCT Glucose    Collection  Time: 09/17/20  4:56 PM   Result Value Ref Range    Glucose, POC 83 70 - 179 mg/dL   POCT Glucose    Collection Time: 09/17/20  8:26 PM   Result Value Ref Range    Glucose, POC 249 (H) 70 - 179 mg/dL   CBC    Collection Time: 09/18/20  4:49 AM   Result Value Ref Range    WBC 9.6 3.5 - 10.5 10*9/L    RBC 5.67 4.32 - 5.72 10*12/L    HGB 15.8 13.5 - 17.5 g/dL    HCT 16.1 09.6 - 04.5 %    MCV 85.2 81.0 - 95.0 fL    MCH 27.9 26.0 - 34.0 pg    MCHC 32.8 30.0 - 36.0 g/dL    RDW 40.9 (H) 81.1 - 15.0 %    MPV 7.6 7.0 - 10.0 fL    Platelet 210 150 - 450 10*9/L   Comprehensive Metabolic Panel    Collection Time: 09/18/20  4:49 AM   Result Value Ref Range    Sodium 138 135 - 145 mmol/L    Potassium 4.1 3.4 - 4.5 mmol/L    Chloride 104 98 - 107 mmol/L    Anion Gap 7 5 - 14 mmol/L    CO2 27.3 20.0 - 31.0 mmol/L    BUN 12 9 - 23 mg/dL    Creatinine 9.14 7.82 - 1.10 mg/dL    BUN/Creatinine Ratio 16     EGFR CKD-EPI Non-African American, Male >90 >=60 mL/min/1.25m2    EGFR CKD-EPI African American, Male >90 >=60 mL/min/1.62m2    Glucose 121 70 - 179 mg/dL    Calcium 9.5 8.7 - 95.6 mg/dL    Albumin 3.7 3.4 - 5.0 g/dL    Total Protein 7.2 5.7 - 8.2 g/dL    Total Bilirubin 0.6 0.3 - 1.2 mg/dL    AST 16 <=21 U/L    ALT 22 10 - 49 U/L    Alkaline Phosphatase 108 46 - 116 U/L   POCT Glucose    Collection Time: 09/18/20  7:28 AM   Result Value Ref Range    Glucose, POC 137 70 - 179 mg/dL       Relevant Studies/Radiology (if blank, then none):  ECG 12 Lead    Result Date: 09/17/2020  NORMAL SINUS RHYTHM NORMAL ECG WHEN COMPARED WITH ECG OF 28-Jun-2020 09:37, NO SIGNIFICANT CHANGE WAS FOUND Confirmed by Lorretta Harp [phone]) on 09/17/2020 1:45:19 PM    CT Chest Wo Contrast    Result Date: 09/16/2020  EXAM: CT CHEST WO CONTRAST DATE: 09/16/2020 12:55 AM ACCESSION: [id number] UN DICTATED: 09/16/2020 1:00 AM INTERPRETATION LOCATION: [facility] CLINICAL INDICATION: 54 years old Male with bronchiectasis  COMPARISON: CT chest 03/18/2018. TECHNIQUE: A helical CT scan was obtained without IV contrast from the thoracic inlet through the hemidiaphragms. Images were reconstructed in the axial plane.  Coronal and sagittal reformatted images of the chest were also provided for further evaluation of the lung parenchyma. FINDINGS: AIRWAYS, LUNGS, PLEURA: Clear central tracheobronchial tree. Interval increase in bronchial wall thickening and bronchiectasis involving the lingula and bilateral lung bases when compared to 03/18/2018. Interval worsening of bilateral nodular/tree-in-bud opacities. For reference: Right upper lobe nodule measuring 0.6 cm (2:28), right middle lobe nodule measuring 1.0 cm (2:53). No pleural effusion. MEDIASTINUM: Normal heart size. No pericardial effusion. Normal caliber thoracic aorta. Independent origin of the left vertebral artery from the aortic arch. Multiple prominent mediastinal lymph nodes, for reference AP window node measuring  1.0 cm (2:41). IMAGED ABDOMEN: Unremarkable. SOFT TISSUES: Unremarkable. BONES: Mild multilevel degenerative disc disease. No suspicious osseous lesions.     --Interval progression of bilateral basilar and lingular predominant bronchiectasis and bronchiolectasis. --Interval increase in diffuse bilateral tree-in-bud and nodular opacities, likely sequelae of mucoid impaction of the distal airways, although superimposed infection cannot be excluded. --Multiple prominent mediastinal lymph nodes, likely reactive.    ______________________________________________________________________  Discharge Instructions:     Diet Instructions     Discharge diet (specify)      Discharge Nutrition Therapy: Regular          Follow Up instructions and Outpatient Referrals     Call MD for:  difficulty breathing, headache or visual disturbances      Call MD for:  persistent dizziness or light-headedness      Call MD for:  persistent nausea or vomiting      Call MD for:  severe uncontrolled pain      Call MD for:  temperature >38.5 Celsius      Discharge instructions      You were admitted to the hospital for an exacerbation of your bronchiectasis. You will need to keep taking the IV antibiotics for a total of two weeks. Do NOT take your [place] right now. Otherwise, we haven't changed any of your home medications. Keep taking them as you have been. Continue to do your airway clearance as well.    Referral to Home Infusion      Performing location?: External    Home Health Requested Disciplines: Nursing    Do you want ongoing co-management?: No    Care coordination required?: No     **Please contact your service pharmacist for assistance with discharge home health infusion monitoring.      Home Health/Home Infusion Orders:     Agency Details:   Infusion Agency: [facility]  Phone: [phone]  Infusion Start of Care Date: 09/18/2020    Nursing Agency: [facility] Phone: [phone]    [facility] Skilled Nurse to teach patient/caregiver the administration of home infusion therapy as ordered.   Please teach/review the following, as applicable: how to flush and maintain IV line, medication bag change, storage/disposal of medication/supplies, and inventory control.  Monitor for signs and symptoms of infection, phlebitis, dislodgement/catheter migration, and occlusion of IV line. Instruct to keep IV site clean, dry and intact.   Review medication side effects.   Teach how to troubleshoot device/IV line function and who to call if dressing gets dirty, wet, or lifts off.   Review and provide the patient/caregiver with contact information regarding who to contact within your agency for questions, concerns or issues.    CVAD Dressing Change:  Please change dressing weekly/prn using sterile CVAD kit/transparent dressing.   Please change Biopatch with CVAD dressing change.   Change the stabilization device (STATLOCK) as applicable with each dressing change.  Please change needleless cap at least weekly with dressing change and prn, and after every blood draw, using aseptic technique.    Note - Generic substitutions permissible by Home Infusion agencies.    IV Access:  Line Type: PICC                Lumens: Double  Diameter/Gauge: 5 Fr.   Length Total: 40 cm.   Site: Right Cephalic  (Example: Basilic or IJ Vein, etc.)  Arm Circumference: 37 cm  (PICC line only)  Placement Date: 09/16/20    Special Instructions:     Flush Orders:  PICC Line Adult Patient  PICC Line Adult Patient:   Flush with 5 mLs of Normal Saline before and after each use and prn for line maintenance. Use after blood draws. Flush with Heparin 100units/mL 2 mLs at the end of each use and prn for line maintenance. Please change needleless cap weekly and after blood draws, or prn using aseptic technique. Please flush unused lumen(s) with Heparin 100units/mL 2 mLs every day.      Disciplines requested: Nursing and Home IV Antibiotics  ??  Physician to follow patient's care (the person listed here will be responsible for signing ongoing orders): Rolm Baptise, MD  ??  Inpatient Start Date of Antibiotics (Day 1): 09/16/20  Start of Care Date (discharge date): 09/18/20  Estimate completion of therapy:09/30/20  Home health/infusion please reach out to nurse contact (below) 2-3 days prior to end of therapy for appropriate orders  ??  IV ANTIBIOTIC ORDERS:  ?? Tobramycin 500mg  IV every 24 hours (CF hi-dose, infuse over 60 minutes)-Please dose in the MORNING  ?? Ceftazidime 2g IV every 8 hours  ??  **DO NOT DRAW DRUG LEVELS FROM PORT**  ??  LAB MONITORING ORDERS:  Start labs on 09/19/20  ?? (Tobramycin CF Hi-dose Ext Interval and Ceftazidime/Cefepime/Aztreonam/Ceftaroline/Piperacillin-tazobactam): ONCE weekly: CBC with diff, tobramycin random level on Mondays (7 hrs after the END of infusion time); TWICE weekly: CMP, Mg. Please collect once weekly labs on Mondays and twice weekly labs on Mondays and Thursdays.  ??  Collect on MONDAYS of each week:  ?? CBC + diff  ?? Tobramycin Random Level (collect 7-8 hrs after END of infusion time)  ??  Collect on THURSDAYS of each week (only for patients on aminoglycosides)  ?? Magnesium  ?? Comprehensive Metabolic Panel   ??  TARGET DRUG LEVELS:  ?? -Tobramycin (Bronchiectasis NON-CF Extended Interval Dosing): Target end of infusion peak: 18-25 mcg/mL, trough <1 mcg/mL  ??  --------------------------------------------------------------------------------------  *PLEASE FAX RESULTS TO: [phone]  ??  *PHONE CONTACT*:  ?? Non-CF Bronchiectasis Patients/Pulmonary NTM for (L. Daniels/K. Allena Katz): Nurse Raoul Pitch, RN [phone]  ?? For critical lab results or urgent matters outside of regular business hours (Mon through Fri 8:00AM-4:30PM, please call the hospital operator at [phone] and ask to page the pulmonary fellow on-call.  ??  **Please warm hand-off to outpatient team nurse coordinators per contact list above**          Appointments which have been scheduled for you    Nov 27, 2020  9:35 AM  (Arrive by 9:20 AM)  RETURN CONTINUITY with Norvel Richards, MD  [facility] INTERNAL MEDICINE [place] [facility]) [address]  [place] [address]  [phone]      Dec 13, 2020 10:00 AM  (Arrive by 9:45 AM)  RETURN PFT 15 with PFT 5  [facility] PULMONARY SPECIALTY FUNCT [place] [facility]) [address]  [place] [address]  [phone]      Dec 13, 2020 10:45 AM  (Arrive by 10:30 AM)  RETURN BRONCHIECTSIS with Truett Mainland, MD  [facility] PULMONARY SPECIALTY CL [place]) [address]  [place] [address]  [phone]           ______________________________________________________________________  Discharge Day Services:  BP (P) 124/63  - Pulse 76  - Temp (P) 36.1 ??C (97 ??F) (Oral)  - Resp 18  - Ht 162.6 cm (5' 4)  - Wt 80.3 kg (  177 lb)  - SpO2 93%  - BMI 30.38 kg/m??   Pt seen on the day of discharge and determined appropriate for discharge.    Condition at Discharge: good    Length of Discharge: I spent greater than 30 mins in the discharge of this patient.

## 2020-09-18 NOTE — Unmapped (Signed)
Phoned [facility] in [place]. Spoke with [employer].  Let them know that I would be faxing an updated IV home infusion referral form to them.     She provided me with the fax number: [phone].     This was sent via e-fax and email confirmation was received.

## 2020-09-18 NOTE — Unmapped (Signed)
Patient did well with doing all of their treatments today. Patient did their airway clearance without complications.

## 2020-09-18 NOTE — Unmapped (Addendum)
The patient is a 54 year old Caucasian male with a history of bronchiectasis secondary to alpha-1 antitrypsin carrier state admitted to the hospital for acute exacerbation of bronchiectasis.  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    He was admitted to the hospital and was started on tobramycin and ceftazidime. He was seen in Pulmonary consultation for ongoing recommendations for antibiotic treatment in anticipation of completion of 2 weeks of intravenous antibiotics.  PICC line was placed without complication. He will continue his course of antibiotics as an outpatient, which will complete on 11/27. Repeat sputum culture was sent during admission and is pending at the time of discharge.

## 2020-09-18 NOTE — Unmapped (Signed)
Hospitalist Daily Progress Note     LOS: 2 days       Assessment/Plan:  Active Problems:    Alpha-1-antitrypsin deficiency carrier    Bronchiectasis with (acute) exacerbation (CMS-HCC)    Obstructive sleep apnea syndrome    Cough  Resolved Problems:    * No resolved hospital problems. *         47.  54 year old male with history of alpha 1 antitrypsin deficiency and bronchiectasis admitted to the hospital for exacerbation of his lung disease.  -Pulmonary consultation, antibiotic regimen in place, PICC line placed 11/15, continue tobramycin, appreciate pharmacy consult for management.  Continue home inhaler regimen, discharge home with IV antibiotics when home health nursing and antibiotic dosing established.    2.  Type 2 diabetes mellitus, on Jardiance at home, continue, sliding scale, regular diet, patient to make appropriate choices.  Last A1c on 08/20/2020 6.5.    3.  Hyperlipidemia, continue atorvastatin.    4.  GERD, continue PPI.    *DVT prophylaxis: Subcutaneous Lovenox    *Disposition: He will require at least 2 midnights before going home, duration of IV antibiotic therapy to be determined.    Please page the [facility]) pager at [phone] with questions.      Consultants:   1.  Pulmonology      Subjective:   His breathing continues to improve.  Productive cough, feels better when he is able to clear his lungs.    Objective:       Vital signs in last 24 hours:  Temp:  [35.7 ??C (96.3 ??F)-36.7 ??C (98 ??F)] 36.7 ??C (98 ??F)  Heart Rate:  [76-88] 88  Resp:  [20-22] 20  BP: (113-138)/(71-80) 136/80  MAP (mmHg):  [87-103] 103  FiO2 (%):  [25 %] 25 %  SpO2:  [92 %-98 %] 98 %    Intake/Output last 24 hours:    Intake/Output Summary (Last 24 hours) at 09/17/2020 1758  Last data filed at 09/17/2020 0450  Gross per 24 hour   Intake 560 ml   Output ???   Net 560 ml         Physical Exam:    Gen: Alert, NAD    HEENT: No icterus.    CV: Regular rate and rhythm.    PULM/chest: Scattered fine rhonchi and some wheezing, coughing paroxysm occurs with deep breath.    AVW:UJWJ, Non tender, No palpable organomegaly    Ext: No C/C/E    Neuro: No acute focal deficits.    Skin: Warm, dry.      Medications:   Scheduled Meds:  ??? atorvastatin  20 mg Oral Daily   ??? azithromycin  250 mg Oral Daily   ??? cefTAZidime  2 g Intravenous Q8H   ??? cetirizine  10 mg Oral Daily   ??? cholecalciferol (vitamin D3 25 mcg (1,000 units))  25 mcg Oral Daily   ??? dornase alfa  2.5 mg Inhalation Daily (RT)   ??? empagliflozin  25 mg Oral Daily   ??? enoxaparin (LOVENOX) injection  40 mg Subcutaneous Q24H   ??? fluticasone furoate-vilanteroL  1 puff Inhalation Daily (RT)   ??? fluticasone propionate  2 spray Each Nare Daily   ??? insulin lispro  1-20 Units Subcutaneous ACHS   ??? loratadine  10 mg Oral Daily   ??? montelukast  10 mg Oral Nightly   ??? pantoprazole  20 mg Oral Daily   ??? sodium chloride 7%  4 mL Nebulization 4x Daily (RT)   ???  sulfamethoxazole-trimethoprim  1 tablet Oral BID   ??? tobramycin (NEBCIN) IVPB (conventional dosing)  500 mg Intravenous Q24H   ??? umeclidinium  1 puff Inhalation Daily (RT)     Continuous Infusions:    Lab Results   Component Value Date    WBC 10.5 09/17/2020    HGB 16.6 09/17/2020    HCT 49.7 09/17/2020    PLT 238 09/17/2020       Lab Results   Component Value Date    NA 138 09/17/2020    K 4.1 09/17/2020    CL 106 09/17/2020    CO2 25.8 09/17/2020    BUN 17 09/17/2020    CREATININE 0.72 09/17/2020    GLU 119 09/17/2020    CALCIUM 9.6 09/17/2020    MG 2.2 09/17/2020    PHOS 3.3 01/06/2013       Lab Results   Component Value Date    BILITOT 0.7 09/17/2020    BILIDIR 0.30 04/04/2018    PROT 7.4 09/17/2020    ALBUMIN 3.9 09/17/2020    ALT 22 09/17/2020    AST 18 09/17/2020    ALKPHOS 108 09/17/2020    GGT 41 01/06/2013       Lab Results   Component Value Date    PT 11.1 08/20/2020    INR 0.95 08/20/2020    APTT 31.2 06/28/2012     Pending Labs     Order Current Status    Lower Respiratory Culture  Specimen acceptable for culture, culture too young to read        25 minutes, over 50% spent in counseling and coordination of care.  Tawni Levy MD

## 2020-09-19 NOTE — Unmapped (Signed)
Pt given discharge instructions, including follow up appointment information and prescriptions.  Pt and family verbalized understanding.  PIV removed and PICC line intact (will get IV ABX at home).  Waiting on transportation now.     Problem: Adult Inpatient Plan of Care  Goal: Plan of Care Review  09/18/2020 1744 by Francena Hanly, RN  Outcome: Discharged to Home  09/18/2020 1544 by Francena Hanly, RN  Outcome: Progressing  Goal: Patient-Specific Goal (Individualized)  09/18/2020 1744 by Francena Hanly, RN  Outcome: Discharged to Home  09/18/2020 1544 by Francena Hanly, RN  Outcome: Progressing  Goal: Absence of Hospital-Acquired Illness or Injury  09/18/2020 1744 by Francena Hanly, RN  Outcome: Discharged to Home  09/18/2020 1544 by Francena Hanly, RN  Outcome: Progressing  Intervention: Identify and Manage Fall Risk  Recent Flowsheet Documentation  Taken 09/18/2020 0800 by Francena Hanly, RN  Safety Interventions:   fall reduction program maintained   lighting adjusted for tasks/safety   low bed   nonskid shoes/slippers when out of bed  Intervention: Prevent and Manage VTE (Venous Thromboembolism) Risk  Recent Flowsheet Documentation  Taken 09/18/2020 0800 by Francena Hanly, RN  Activity Management: up ad lib  Goal: Optimal Comfort and Wellbeing  09/18/2020 1744 by Francena Hanly, RN  Outcome: Discharged to Home  09/18/2020 1544 by Francena Hanly, RN  Outcome: Progressing  Goal: Readiness for Transition of Care  09/18/2020 1744 by Francena Hanly, RN  Outcome: Discharged to Home  09/18/2020 1544 by Francena Hanly, RN  Outcome: Progressing  Goal: Rounds/Family Conference  09/18/2020 1744 by Francena Hanly, RN  Outcome: Discharged to Home  09/18/2020 1544 by Francena Hanly, RN  Outcome: Progressing     Problem: Infection  Goal: Absence of Infection Signs and Symptoms  09/18/2020 1744 by Francena Hanly, RN  Outcome: Discharged to Home  09/18/2020 1544 by Francena Hanly, RN  Outcome: Progressing     Problem: Diabetes Comorbidity  Goal: Blood Glucose Level Within Targeted Range  09/18/2020 1744 by Francena Hanly, RN  Outcome: Discharged to Home  09/18/2020 1544 by Francena Hanly, RN  Outcome: Progressing     Problem: Obstructive Sleep Apnea Risk or Actual Comorbidity Management  Goal: Unobstructed Breathing During Sleep  09/18/2020 1744 by Francena Hanly, RN  Outcome: Discharged to Home  09/18/2020 1544 by Francena Hanly, RN  Outcome: Progressing

## 2020-09-19 NOTE — Unmapped (Signed)
Spoke with [name] who is now home from [facility] with home IV antibiotics.    Home Health nurse ([facility]) went to home but he stated that no labs were drawn today. Name of nurse was Brett Hanna.     [name] is dosing his Tobramycin at 9 am.     Phoned [facility] and spoke with Brett Hanna. Reviewed orders for lab draws on Monday and Thursdays.     For this week since lab did not get drawn today, plan will be to obtain the BMP and Tobramycin peak on 11/19 (Friday) and then resume Mon/Th schedule.     Will follow-up with them early next week regarding what to do for labs that are due on Thurs (Thanksgiving [place]).     Our office fax numbers were also provided to [facility] today. Will follow-up with labs tomorrow.     Also faxed updated referral with labs orders to [facility] office. Email confirmation was received.

## 2020-09-23 DIAGNOSIS — R6339 Oral aversion: Principal | ICD-10-CM

## 2020-09-23 LAB — TOBRAMYCIN LEVEL, PEAK: TOBRAMYCIN PEAK: 16.2 ug/mL

## 2020-09-23 NOTE — Unmapped (Signed)
Phoned [facility] and spoke with [place]. They stated that they would send results of the lab work from 11/19.     Also let her know that due to the holiday we would need Thursday's labs drawn on Wednesday instead.     She stated that she would relay that message to home health nurse.     Will follow-up on labs from Friday.

## 2020-09-24 DIAGNOSIS — J471 Bronchiectasis with (acute) exacerbation: Principal | ICD-10-CM

## 2020-09-24 LAB — CBC W/ DIFFERENTIAL
BASOPHILS ABSOLUTE COUNT: 0.1 10*9/L
BASOPHILS RELATIVE PERCENT: 1 %
EOSINOPHILS ABSOLUTE COUNT: 0.2 10*9/L
EOSINOPHILS RELATIVE PERCENT: 3 %
HEMATOCRIT: 46 %
HEMOGLOBIN: 15.4 g/dL
LYMPHOCYTES ABSOLUTE COUNT: 0.6 10*9/L — ABNORMAL LOW (ref 0.7–3.1)
LYMPHOCYTES RELATIVE PERCENT: 10 %
MEAN CORPUSCULAR HEMOGLOBIN CONC: 33.5 g/dL
MEAN CORPUSCULAR HEMOGLOBIN: 27.8 pg
MEAN CORPUSCULAR VOLUME: 83 fL
MONOCYTES ABSOLUTE COUNT: 0.8 10*9/L
MONOCYTES RELATIVE PERCENT: 12 %
NEUTROPHILS ABSOLUTE COUNT: 4.9 10*9/L
NEUTROPHILS RELATIVE PERCENT: 73 %
PLATELET COUNT: 274 10*9/L
RED BLOOD CELL COUNT: 5.53 10*12/L
RED CELL DISTRIBUTION WIDTH: 14.1 %
WHITE BLOOD CELL COUNT: 6.6 10*9/L

## 2020-09-24 LAB — TOBRAMYCIN LEVEL, RANDOM: TOBRAMYCIN RANDOM: 0.9 ug/mL

## 2020-09-24 LAB — COMPREHENSIVE METABOLIC PANEL
ALKALINE PHOSPHATASE: 90 U/L
ALT (SGPT): 8 U/L
AST (SGOT): 12 U/L
BILIRUBIN TOTAL: 0.5 mg/dL
BLOOD UREA NITROGEN: 24 mg/dL
CALCIUM: 8.3 mg/dL — ABNORMAL LOW (ref 8.7–10.2)
CHLORIDE: 111 mmol/L — ABNORMAL HIGH (ref 96–106)
CO2: 17 mmol/L — ABNORMAL LOW (ref 20.0–29.0)
CREATININE: 1.67 mg/dL — ABNORMAL HIGH (ref 0.76–1.27)
GLUCOSE RANDOM: 130 mg/dL
POTASSIUM: 4.3 mmol/L
PROTEIN TOTAL: 6.3 g/dL
SODIUM: 142 mmol/L

## 2020-09-24 LAB — MAGNESIUM: MAGNESIUM: 1.8 mg/dL

## 2020-09-24 MED ORDER — SODIUM CHLORIDE 0.9% IVPB SOLN
INTRAVENOUS | 0 refills | 3 days
Start: 2020-09-24 — End: 2020-09-27

## 2020-09-24 MED FILL — ATORVASTATIN 20 MG TABLET: 30 days supply | Qty: 30 | Fill #5 | Status: AC

## 2020-09-24 MED FILL — ATORVASTATIN 20 MG TABLET: ORAL | 30 days supply | Qty: 30 | Fill #5

## 2020-09-24 NOTE — Unmapped (Signed)
Bronchiectasis Clinic Pharmacist Telehealth Note     Reason for visit: Outpatient Home IV Antibiotic Lab Monitoring, results from 09/23/2020     Outpatient IV Antimicrobial Therapy:  Start Date: 09/16/2020  End Date: 09/30/2020  Line: PICC  Referring pulmonologist: Rolm Baptise, MD    IV Antibiotics Ordered:  ??? Tobramycin 500 mg IV every 24 hours (NON-CF extended interval dosing)-Please dose in the MORNING  ??? Ceftazidime 2g IV every 8 hours    Goal Drug Levels:  ??? Bronchiectasis NON-CF Ext Interval Dosing: End of infusion peak: 18-25 mcg/mL, trough <1 mcg/mL     September 24, 2020 3:24 PM: Phone Call to Patient  ?? Spoke with [name] who reports he's doing well overall.  ?? Denies changes to urine output. Feels that he could do a better job about drinking more fluids and water  ?? Endorses right ear pain that started after a coughing spell the other day. He initially felt like he pulled a muscle in his neck. States the neck pain has improved but now it feels like I may have an ear infection, especially when I turned my head.    General Labs   Recent Labs   Lab Units 09/23/20  1700 09/18/20  0449   WBC 10*9/L 6.6 9.6   HEMOGLOBIN g/dL 29.5 28.4   HEMATOCRIT % 46.0 48.3   PLATELET COUNT (1) 10*9/L 274 210   NEUTRO ABS 10*9/L 4.9  --    LYMPHO ABS 10*9/L 0.6*  --    EOSINO ABS 10*9/L 0.2  --    BASOS ABS 10*9/L 0.1  --      Recent Labs   Lab Units 09/23/20  1700 09/18/20  0449   BUN mg/dL 24 12   CREATININE mg/dL 1.32* 4.40     Recent Labs   Lab Units 09/23/20  1700   MAGNESIUM mg/dL 1.8     Recent Labs   Lab Units 09/23/20  1700 09/18/20  0449   AST U/L 12 16   ALT U/L 8 22   ALK PHOS U/L 90 108   BILIRUBIN TOTAL mg/dL 0.5 0.6     Collected Drug Levels     Date Infusion   Time Lab   Time Drug Levels   09/20/20 0900 - 1000 1100 tobramycin peak: 16.2 mcg/mL. drawn ~1 hour(s) after end of infusion time   09/23/20 0900 - 1000 1700 tobramycin random: 0.9 mcg/mL. drawn ~7 hour(s) after end of infusion time     Wt Readings from Last 3 Encounters:   09/15/20 80.3 kg (177 lb)   09/12/20 78.9 kg (174 lb)   08/20/20 80 kg (176 lb 6.4 oz)     Patient-specific Pharmacokinetics:    Date   Dose Extrapolated levels (mcg/mL)   Vd (L)   L/kg   Ke (hr-1)     Peak Trough        09/23/20   500 mg Q24H   26.2   0.00   13.6   0.17   0.482     Recommendations at this time unless clinically indicated otherwise:  ??? Current levels slightly Supratherapeutic.   ??? Renal function is declining. SCr has increased from 0.74 at discharge to 1.67 mg/dL (102% increase). Other general labs are stable from last check.   ??? Discussed with Dr. Garner Nash and given patient's report of right ear pain and increase in SCr will plan to:  o HOLD tobramycin.  o Continue ceftazidime 2 g every 8 hours. (Note: current  CrCl ~50 ml/min. If renal function does not improved will need to dose adjust ceftazidime)  o Give IV fluids - 1 L NS daily x3 days.    Next Labs:   Due on 09/26/20  ??? Magnesium  ??? Basic Metabolic Panel    Due on 09/30/20 (technically last day of antibiotics, however will plan to still get labs to review renal function)  ??? CBC + diff  ??? Magnesium  ??? Basic Metabolic Panel'    Orders above were communicated to [facility] ([facility]): [phone], Jacolyn Reedy, [facility] on 09/24/20 at 2:15 PM.      Electronically signed:  Barbette Hair, PharmD, MPH, BCPS, CPP  Clinical Pharmacist Practitioner  [facility]  [phone]    CC:   ??? Rolm Baptise, MD  ??? Arsenio Loader ([facility] Nurse Coordinator)

## 2020-09-24 NOTE — Unmapped (Signed)
Spoke to Ms. Stage today following review of labs by our CPP and collaboration with Dr. Garner Nash.     Discussed that his creatinine level was increased and we are going to order intravenous fluids through his PICC line. Let him know the IV fluids will come from the IV infusion company.     He will hold the IV tobramycin tomorrow and until we let him know to resume.     Will continue the IV ceftazidime.     He states that he is drinking water but stated he could also be drinking more.     Told him that we will touch base tomorrow as well.     He will have labs done by home health on Thursday, 11/25.

## 2020-09-24 NOTE — Unmapped (Signed)
Spoke to Moroni with [facility] did not result the BMP from Friday, 11/19. Only result we received was his tobramycin peak. They will run it and it will take about 2-3 hours to result. They will call Judeth Cornfield with results and then she will call me.       Also spoke with [name] today. He states other than feeling like his line may be a bit sluggish to flush, otherwise he reported no side effects.     Tobramycin taking about 2 hours to infuse per [name].     Encouraged him to talk to his nurse when he is there later this afternoon to draw labs.     On Wednesday 11/24, [name] will not be home exactly when labs are due but I told him to please let the nurse know when to arrive and draw labs and be sure to let us know he doses the tobramycin.

## 2020-09-25 NOTE — Unmapped (Signed)
Spoke with [name] today. He was still not home but he will be home this afternoon.     Will check to see if he received delivery from home infusion for his normal saline boluses.     He also stated that he has been drinking a lot more water since yesterday.     He did hold his tobramycin today. Will continue to hold it until he is told to resume.     Told him to please call me when he gets home this afternoon a to help and to please infuse the normal saline to help flush out his kidneys.     Plan is still for him to have labs drawn on 11/25 (Thursday).

## 2020-09-27 NOTE — Unmapped (Signed)
Adult Pulmonary Specialty Clinic Pharmacist Note     September 27, 2020 10:46 AM: Phone call to Patient  ??? Spoke with [name] who states the home health nurse did go to his house yesterday but they did not draw labs. Reports nurse claimed that he didn't need labs since he was getting IV fluids.  ??? In regards to pulmonary symptoms, reports he's feeling better and doesn't taste the infection anymore since starting IV ABX. Still has a dry cough but it's improved.   ??? Reports ear pain went away a couple days ago. Denies changes to hearing, ringing in ears, nor dizziness.  ??? Patient reports they will be moving to [place], [place] next week Thursday.  ??? Since we're not able to confirm improvement in SCr, instructed patient continue ceftazidime 2 g every 8 hours and continue HOLDING tobramycin for now.    September 27, 2020 10:56 AM: Phone call to [facility]  ?? Called to follow up on why labs were not drawn.  ?? Left voicemail with nursing manager, Maximino Sarin with CPP Callback number.    Total time spent: 20 minutes     Electronically signed:  Barbette Hair, PharmD, MPH, BCPS, CPP  Clinical Pharmacist Practitioner  [facility]  [phone]    CC:   ??? Rolm Baptise, MD  ??? Arsenio Loader ([facility] Nurse Coordinator)

## 2020-09-30 DIAGNOSIS — J479 Bronchiectasis, uncomplicated: Principal | ICD-10-CM

## 2020-09-30 MED ORDER — CETIRIZINE 10 MG TABLET
ORAL_TABLET | Freq: Every day | ORAL | 3 refills | 90 days | Status: CP
Start: 2020-09-30 — End: ?
  Filled 2020-10-02: qty 90, 90d supply, fill #0

## 2020-09-30 NOTE — Unmapped (Signed)
Adult Pulmonary Specialty Clinic Pharmacist Note     September 30, 2020 12:39 PM: Phone call to [facility]  ??? Spoke with Nurse Manager, Brett Hanna, regarding why patient's labs were not drawn on 09/26/20 as originally planned.  ??? She reports the nurse mistakenly thought patient didn't need any labs drawn since he was holding his tobramycin.  ??? Explained to Brett Hanna that although he was holding tobramycin, we still needed his BMP and Magnesium in order to re-assess kidney function and determine plans for resuming tobramycin.   ??? She apologized for the mistake and states she'll speak with [facility] nurse and request he go to patients home earlier today and puts in labs as a STAT order.    PLAN:  ?? RN Brett Hanna spoke with Dr. Garner Hanna and we will plan for patient to extended ceftazidime for 4 days (unitl 10/04/20).  ?? In regards to plan for tobramycin:  o If SCr improved (below 1.00 mg/dL), RESUME tobramycin at lower dose of 450 mg Q24H until 10/04/20.    Total time spent: 10 minutes     Electronically signed:  Barbette Hair, PharmD, MPH, BCPS, CPP  Clinical Pharmacist Practitioner  [facility]  [phone]    CC:   ??? Brett Baptise, MD  ??? Brett Hanna ([facility] Nurse Coordinator)

## 2020-09-30 NOTE — Unmapped (Signed)
Pharmacy Refill Request     cetirizine (ZYRTEC) 10 MG tablet         Sig: Take 1 tablet (10 mg total) by mouth daily.    Disp:  90 tablet ???? Refills:  3    Start: 09/30/2020    Class: Normal    Last ordered: 1 year ago by Truett Mainland, MD Last dispensed: 05/01/2020         Last OV: 09/12/2020    *has been alternating with Allegra per notes in Epic on 09/16/2020    Last written Rx: 09/22/2019    Last dispensed:  05/01/2020      Upcoming appointment: 12/13/2020

## 2020-09-30 NOTE — Unmapped (Signed)
Follow-up as follows:    Spoke with Jacolyn Reedy, Pharmacist with [facility]. They will send out enough doses of Ceftaz today to extend through Thursday, 12/2. Let him know that we will follow-up with them regarding status of Tobramycin tomorrow after labs from today have been reviewed.     Spoke with [name] today the following:  -extending Ceftaz through Thursday, 11/2  -home health nurse to draw labs today. Importance to see result of kidney function.   -status of tobramycin to be determined and based on labs. He has still been holding tobramycin. States he has 4 doses left at home.   [name] will be moving to [facility] on Thursday morning, 11/2. He said if he is doing tobrmaycin, he can still infuse but may be a little later that morning. He is aware that I will follow-up with [facility] to see about transferring him to the office they have there.       Phoned Amy at [facility]. She also spoke with Seyram this morning. She is in communication with [facility] nurse and he will go to draw labs as planned as soon as they can today.  He will also verify Mr. Marlow Baars new address in [place] so they can transfer his care there come Thursday. A verbal order was provided for them to transfer his care at that time so that any labs and discontinuing of the PICC line can be assured once he has moved.

## 2020-10-01 NOTE — Unmapped (Signed)
Phoned [facility] as had not seen any labs resulted yet on [name].     Was told that Clinical Manager was on the phone with the lab and they will return my call.

## 2020-10-01 NOTE — Unmapped (Signed)
Received call back from [place] at [facility].     She notified me that she had been on the phone with [facility] regarding issue with his blood work from yesterday.     Was informed by Judeth Cornfield that they were able to run the CBC with diff but not the chemistries because  The labs tubes were not spun down appropriately.    Judeth Cornfield assured me that she has follow-up in with managers at [employer] regarding this issue.    Trey Berdine Rasmusson, RN is also going back to Ms. Shehata's home today to re-draw the chemistries.    When tubes are dropped off at [facility], they were instructed to call Judeth Cornfield so that it can be assure that tests are run correctly and they give [place] results as soon as possible.

## 2020-10-02 MED FILL — SULFAMETHOXAZOLE 400 MG-TRIMETHOPRIM 80 MG TABLET: 90 days supply | Qty: 180 | Fill #3 | Status: AC

## 2020-10-02 MED FILL — OMEPRAZOLE 20 MG CAPSULE,DELAYED RELEASE: 90 days supply | Qty: 180 | Fill #1 | Status: AC

## 2020-10-02 MED FILL — OMEPRAZOLE 20 MG CAPSULE,DELAYED RELEASE: ORAL | 90 days supply | Qty: 180 | Fill #1

## 2020-10-02 MED FILL — MONTELUKAST 10 MG TABLET: 90 days supply | Qty: 90 | Fill #2 | Status: AC

## 2020-10-02 MED FILL — SULFAMETHOXAZOLE 400 MG-TRIMETHOPRIM 80 MG TABLET: ORAL | 90 days supply | Qty: 180 | Fill #3

## 2020-10-02 MED FILL — MONTELUKAST 10 MG TABLET: ORAL | 90 days supply | Qty: 90 | Fill #2

## 2020-10-02 MED FILL — CETIRIZINE 10 MG TABLET: 90 days supply | Qty: 90 | Fill #0 | Status: AC

## 2020-10-02 NOTE — Unmapped (Signed)
Spoke to Jacolyn Reedy, [facility] with [facility] to le him know we still did not have labs so did not know status of resuming tobramycin at this time.     Also let him know of patient's move to [facility]. They still serve that area and are able to ship him medications there.

## 2020-10-02 NOTE — Unmapped (Signed)
Spoke to Del[place] with [facility] at 5:45 pm on 11/30.     She called to [place] know that [facility] location that the labs were hand delivered to did not run and result the labs again.     Judeth Cornfield and her Development worker, international aid are taking this up the administrative chain there. It was again an issue that the tubes were not spun down.    After discussion and Dr. Garner Nash aware. Will have nurse return to drawn labs as early as possible on 12/1. The home health nurse will take the lab tubes to a satellite location for handling and then they wll be sent to main location per [place].     Phoned [name] and did relay the issue with his blood work. He also did understand the importance of having this drawn again so that we can see his renal function and determine if we can restart the tobramycin.

## 2020-10-02 NOTE — Unmapped (Signed)
Spoke to [place] with [facility].     Let me know that nurse did draw labs again today and they are currently in the lab.     She has left another voicemail for a Supervisor at [employer] regarding the recent lab issues.     Judeth Cornfield will fax and let me know when they have received results.

## 2020-10-02 NOTE — Unmapped (Deleted)
Internal Medicine Hospital Follow-up Video Visit    This visit is conducted via video conferencing.    Contact Information  Person Contacted: {vcrphonecontact:66672}  Contact Phone number: [phone] (home)   Is there someone else in the room? {televisitvisitor:66677}  Patient agreed to a video visit    [name] is a 54 y.o. male  participating in a video visit.    CHIEF COMPLAINT/HISTORY OF PRESENT ILLNESS:   Hospital Follow-Up for No chief complaint on file.    History obtained from: {HFU History:43131:a:Patient}  Date of Hospitalization Discharge:  09/18/20     HPI:  ***        MEDICATIONS AND ALLERGIES:   Reviewed and updated in Epic.  Medication and allergy review was completed by the ***pharmacist and discussed during this visit. Please see their note within this encounter.    SOCIAL/FAMILY HISTORY:  Social History:  Reviewed in Epic.  ***Biopsychosocial barriers to care and advanced directives were addressed by the social worker and discussed during the visit. Please see their note within this encounter.    REVIEW OF SYSTEMS:    All other systems reviewed are negative except as noted here or in HPI.    Medication adherence and barriers to the treatment plan have been addressed. Opportunities to optimize healthy behaviors have been discussed. Patient / caregiver voiced understanding.      Objective:  ***      Labs:  Reviewed from discharge.  See Epic labs.     Radiology: Reviewed radiology studies from discharge. See in Epic.   Reviewed: {HFU materials reviewed:43128:a:Discharge summary,Labs}; See results in Epic  Discussed care with: {HFU Multiple:43127:a:Social worker,Pharmacist}  Interactive Contact by phone or other method (Encounter type: Patient Outreach):     Patient Outreach History (Since 09/18/2020)     Transition of Care     Date Method of Outreach Associated Actions User Next Outreach    09/20/2020  3:26 PM Telephone  Susanne Greenhouse, RN     09/19/2020 12:09 PM Telephone  Susanne Greenhouse, RN                {HFU Interactive Contact:43130:a:Successful contact made or 2 unsuccessful attempts within 2 business days}    Assessment & Plan:  No diagnosis found.    ***    No follow-ups on file.       Medication adjustments:   1. ***      Patient provided with an updated and reconciled medications list.      {    Coding tips - Do not edit this text, it will delete upon signing of note!    ?? Telephone visits [phone] for Physicians and APP??s and [phone] for Non- Physician Clinicians)- Only use minutes on the phone to determine level of service.    ?? Video visits [phone]) - Use both minutes on video and pre/post minutes to determine level of service.       :75688}    I spent *** minutes on the {phone audio video visit:67489} with the patient on the date of service. I spent an additional *** minutes on pre- and post-visit activities on the date of service.     The patient was physically located in [place] or a state in which I am permitted to provide care. The patient and/or parent/guardian understood that s/he may incur co-pays and cost sharing, and agreed to the telemedicine visit. The visit was reasonable and appropriate under the circumstances given the patient's presentation at the time.  The patient and/or parent/guardian has been advised of the potential risks and limitations of this mode of treatment (including, but not limited to, the absence of in-person examination) and has agreed to be treated using telemedicine. The patient's/patient's family's questions regarding telemedicine have been answered.     If the visit was completed in an ambulatory setting, the patient and/or parent/guardian has also been advised to contact their provider???s office for worsening conditions, and seek emergency medical treatment and/or call 911 if the patient deems either necessary.      SUMMARY OF ASSOCIATED HOSPITALIZATION    Admit Date: 09/15/2020  ??  Discharge Date: 09/18/2020 ??  Discharge To: Home  ??  Discharge Service: HBR - HBC: Hospitalist Service #2   ??  Discharge Attending Physician: Kathryne Hitch, MD  ??  Discharge Diagnoses:  Active Problems:    Alpha-1-antitrypsin deficiency carrier POA: Not Applicable    Bronchiectasis with (acute) exacerbation (CMS-HCC) POA: Yes    Obstructive sleep apnea syndrome POA: Yes    Cough POA: Yes  Resolved Problems:    * No resolved hospital problems. *  ??  ??  Outpatient Provider Follow Up Issues:   [ ]  Lab monitoring: CBC + diff, tobramycin random level each Monday. CMP and Mg each Thursday.   [ ]  Goal tobramycin level: Target end of infusion peak: 18-25 mcg/mL, trough <1 mcg/mL  ??  ??  Hospital Course:   The patient is a 54 year old Caucasian male with a history of bronchiectasis secondary to alpha-1 antitrypsin carrier state admitted to the hospital for acute exacerbation of bronchiectasis.  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    He was admitted to the hospital and was started on tobramycin and ceftazidime. He was seen in Pulmonary consultation for ongoing recommendations for antibiotic treatment in anticipation of completion of 2 weeks of intravenous antibiotics.  PICC line was placed without complication. He will continue his course of antibiotics as an outpatient, which will complete on 11/27. Repeat sputum culture was sent during admission and is pending at the time of discharge.

## 2020-10-03 LAB — COMPREHENSIVE METABOLIC PANEL
ALKALINE PHOSPHATASE: 103 U/L
ALT (SGPT): 33 U/L
AST (SGOT): 23 U/L
BILIRUBIN TOTAL: 0.3 mg/dL
BLOOD UREA NITROGEN: 17 mg/dL
CALCIUM: 9.2 mg/dL
CHLORIDE: 97 mmol/L
CO2: 20 mmol/L
CREATININE: 0.8 mg/dL
GLUCOSE RANDOM: 102 mg/dL
POTASSIUM: 4.7 mmol/L
PROTEIN TOTAL: 7.5 g/dL
SODIUM: 134 mmol/L

## 2020-10-03 NOTE — Unmapped (Signed)
Bronchiectasis Clinic Pharmacist Telehealth Note     Reason for visit: Outpatient Home IV Antibiotic Lab Monitoring, results from 10/02/2020     Outpatient IV Antimicrobial Therapy:  Start Date: 09/16/2020  End Date: 10/11/2020 (extended from 09/30/20)  Line: PICC  Referring pulmonologist: Rolm Baptise, MD    IV Antibiotics Ordered:  ?? Currently HOLDING Tobramycin 500 mg IV every 24 hours (NON-CF extended interval dosing)-Please dose in the MORNING  ??? Ceftazidime 2g IV every 8 hours    Goal Drug Levels:  ??? Bronchiectasis NON-CF Ext Interval Dosing: End of infusion peak: 18-25 mcg/mL, trough <1 mcg/mL       General Labs   No results in the last week  Recent Labs   Lab Units 10/02/20  1100   BUN mg/dL 17   CREATININE mg/dL 1.61     No results in the last week  Recent Labs   Lab Units 10/02/20  1100   AST U/L 23   ALT U/L 33   ALK PHOS U/L 103   BILIRUBIN TOTAL mg/dL 0.3     Collected Drug Levels     Date Infusion   Time Lab   Time Drug Levels   09/20/20 0900 - 1000 1100 tobramycin peak: 16.2 mcg/mL. drawn ~1 hour(s) after end of infusion time   09/23/20 0900 - 1000 1700 tobramycin random: 0.9 mcg/mL. drawn ~7 hour(s) after end of infusion time     Wt Readings from Last 3 Encounters:   09/15/20 80.3 kg (177 lb)   09/12/20 78.9 kg (174 lb)   08/20/20 80 kg (176 lb 6.4 oz)     Patient-specific Pharmacokinetics:    Date   Dose Extrapolated levels (mcg/mL)   Vd (L)   L/kg   Ke (hr-1)     Peak Trough        09/23/20   500 mg Q24H   26.2   0.00   13.6   0.17   0.482     Recommendations at this time unless clinically indicated otherwise:  ??? Current tobramycin levels N/A (have been holding tobramycin since 09/24/20)  ??? Renal function is back to baseline. Scr improved from 1.67 to 0.8 mg/dL.  ??? Discussed with Dr. Garner Nash and will plan to:  o Resume tobramycin at lower dose of 450 mg Q24H - patient will likely not restart until the morning of 10/04/20 due to delivery times from the infusion pharmacy.  o Continue ceftazidime 2 g every 8 hours.    Next Labs:   Due on 10/07/20  ??? CBC + diff  ??? Magnesium  ??? Basic Metabolic Panel  ??? Tobramycin Random Level (collect 7-8 hrs after END of infusion time)     Due on 10/10/20  ??? Magnesium  ??? Basic Metabolic Panel  ??? Tobramycin Peak Level (collect 1-1.5 hrs after END of infusion time)'    Orders above were communicated to [facility] ([facility]): [phone]) [phone], Jacolyn Reedy, RPH on 10/02/20 at 5:43 PM.      Electronically signed:  Damita Dunnings, PharmD, CPP, BCACP covering for:  Barbette Hair, PharmD, MPH, BCPS, CPP  Clinical Pharmacist Practitioner  [facility]  [phone]    CC:   ??? Rolm Baptise, MD  ??? Arsenio Loader ([facility] Nurse Coordinator)

## 2020-10-03 NOTE — Unmapped (Signed)
E-fax notes and sleep study to [facility] as requested for CPAP.

## 2020-10-03 NOTE — Unmapped (Signed)
HOSPITAL FOLLOW UP PRE-VISIT ASSESSMENT:  ___________________________________________________    Care Management Intern (CMI) contacted patient via phone to follow up on their recent hospitalization.      Is there someone else in the room? Yes. What is your relationship? Friend. Do you want this person here for the visit? Yes.      Confirmed day and time of appointment with patient is:  9:00am on 10/04/20  ?? Do you have difficulty getting to your medical appointments: No.      ?? How often do you have someone help you when you read instructions, pamphlets or written material from your doctor or pharmacy: Never    Discharge Home Health [facility]) Care and Durable Medical Equipment (DME) Status and Need:   HH:   ?? does  have home health services. Reports receiving services from [facility] nurse and physical therapist.  ?? does not have personal care services/personal aide support and denies need for support.    DME:   ?? Patient reports use of CPAP and oxygen.    ?? denies need for additional equipment at this time. Patient reports he previously ran out of oxygen, but is expecting delivery of an oxygen tank today, 12/2.      Housing and Aftercare Support:    ?? Patient lives with their spouse in a [place] in [place].   ?? He denies issues with home safety.    ?? The patient receives support from his wife.      Mental Health:   PHQ-2 Score:  PHQ-2 Total Score : 0    Substance Use:   ?? Alcohol   ? How much alcohol do you typically drink in a week? Never    ?? Other Substances   ? In the past year how often have you taken non-prescribed prescription medications? Never  ? In the past year how often have you used substances such as marijuana, cocaine, heroin, PCP, ecstacy, etc) Never    ?? Tobacco   ? When was the last time you used a tobacco product such as cigarettes, cigars, cigarillos, smokeless tobacco, vape or e-cigarettes? Never ------------------------------------------------------------------------------------------------------------------------------------------   Note routed to [employer], LCSW, Forde Radon, PharmD, CPP and Clyda Hurdle, MD, PhD who will follow up w/patient during their HFU visit.      Family, social and cultural characteristics were assessed during the visit and plan.     Patient was informed they can call the clinic at [phone] with any additional questions or concerns.     Care Management Intern Fara Boros Jim Philemon completed this task under supervision with me and in consultation as indicated.    Henrietta Dine, MSW, Scientist, physiological Internal Medicine

## 2020-10-03 NOTE — Unmapped (Signed)
Faxed prescriptions for oxygen and cpap to Shane at [facility], patient is moving and [facility] unable to provide oxygen where he will be living.  Spoke with patient to let him know.

## 2020-10-03 NOTE — Unmapped (Signed)
I spoke to [name] to notify him of plan.     Discussed the following:  -Re-start tobramycin IV on 10/04/2020 thru 12/9  -Continue ceftazidime thru 12/9  -Home Health to draw labs on 12/3 (Friday)    Phoned Willa Frater at [facility] to confirm shipmentof tobramycin and to notify him of also extending his ceftazidime until 12/9.

## 2020-10-03 NOTE — Unmapped (Signed)
Mr. Marlow Baars care will be transferred to [facility] more local to his new home.     This will be starting 12/2.     Contact: [phone]- Ander Purpura

## 2020-10-04 NOTE — Unmapped (Signed)
Spoke with patient and he has received his oxygen at his new home. He would like to keep his CPAP and says that his permanent home has not changed.  I spoke with his wife and she is going to call and discuss with [facility] and will let me know if I should move forward with getting him a new CPAP machine.

## 2020-10-04 NOTE — Unmapped (Signed)
On 10/03/20 at 1630 spoke to [facility] Nurse at new [facility] location.     Updated her on plan for continuation of IV therapy thru 12/9 and lab orders to resume on Monday, 12/6.     No other questions at this time.

## 2020-10-07 NOTE — Unmapped (Signed)
Call placed to [facility]. VORB.

## 2020-10-07 NOTE — Unmapped (Signed)
*  ADMIN: please route encounter to PCP (confirm first)  *PROVIDER: please route response to nursing pool [facility] Nursing Staff) to authorize/deny verbal orders    Incoming request for verbal home health/therapy orders.  Please review the following request:        Type of service: Nursing    Length of service: 8 weeks    Frequency of service: 2 times per week    Other instructions or special requests from caller:     Contact: Fabio Bering  Agency: [facility] number: [phone]

## 2020-10-07 NOTE — Unmapped (Signed)
Agree. Can you call in?

## 2020-10-08 DIAGNOSIS — J479 Bronchiectasis, uncomplicated: Principal | ICD-10-CM

## 2020-10-08 NOTE — Unmapped (Signed)
Phoned [facility] to inquire about lab results from yesterday. They had not received but will follow-up with lab and then fax results to our office for review.     Phoned [name] today.     [name] dosed Tobramycin IV around 6:30 am on 10/07/20.     Other than reporting that he has felt a little fullness in his ear since he started IV antibiotics, he is not reporting any side effects.     He reports to be hydrating well. Let him know that once we have received the labs that we would update him.

## 2020-10-09 LAB — COMPREHENSIVE METABOLIC PANEL
ALBUMIN: 4.5 g/dL
ALKALINE PHOSPHATASE: 101 U/L
ALT (SGPT): 21 U/L
AST (SGOT): 17 U/L
BILIRUBIN TOTAL: 0.4 mg/dL
BLOOD UREA NITROGEN: 18 mg/dL
CALCIUM: 9.7 mg/dL
CHLORIDE: 100 mmol/L
CO2: 22 mmol/L
CREATININE: 0.78 mg/dL
GLUCOSE RANDOM: 153 mg/dL
POTASSIUM: 4 mmol/L
PROTEIN TOTAL: 7.3 g/dL
SODIUM: 135 mmol/L — ABNORMAL LOW (ref 136–146)

## 2020-10-09 LAB — CBC W/ DIFFERENTIAL
BASOPHILS ABSOLUTE COUNT: 0.1 10*9/L
BASOPHILS RELATIVE PERCENT: 1.1 %
EOSINOPHILS ABSOLUTE COUNT: 0.2 10*9/L
EOSINOPHILS RELATIVE PERCENT: 2.7 %
HEMATOCRIT: 46.3 %
HEMOGLOBIN: 15.4 g/dL
LYMPHOCYTES ABSOLUTE COUNT: 0.6 10*9/L — ABNORMAL LOW (ref 1.0–4.5)
LYMPHOCYTES RELATIVE PERCENT: 7.7 % — ABNORMAL LOW (ref 25.0–40.0)
MEAN CORPUSCULAR HEMOGLOBIN CONC: 33.3 g/dL
MEAN CORPUSCULAR HEMOGLOBIN: 28.5 pg
MEAN CORPUSCULAR VOLUME: 86 fL
MEAN PLATELET VOLUME: 10.1 fL
MONOCYTES ABSOLUTE COUNT: 0.7 10*9/L
MONOCYTES RELATIVE PERCENT: 9.1 %
NEUTROPHILS ABSOLUTE COUNT: 5.9 10*9/L
NEUTROPHILS RELATIVE PERCENT: 79 % — ABNORMAL HIGH (ref 50.0–70.0)
PLATELET COUNT: 271 10*9/L
RED BLOOD CELL COUNT: 5.41 10*12/L
RED CELL DISTRIBUTION WIDTH: 47.6 % — ABNORMAL HIGH (ref 36.0–47.0)
WHITE BLOOD CELL COUNT: 7.5 10*9/L

## 2020-10-09 LAB — TOBRAMYCIN LEVEL, RANDOM: TOBRAMYCIN RANDOM: 1.3 ug/mL

## 2020-10-09 NOTE — Unmapped (Signed)
Received

## 2020-10-09 NOTE — Unmapped (Signed)
Bronchiectasis Clinic Pharmacist Telehealth Note     Reason for visit: Outpatient Home IV Antibiotic Lab Monitoring, results from 10/07/20    Outpatient IV Antimicrobial Therapy:  Start Date: 09/16/2020  End Date: 10/11/2020 (extended from 09/30/20)  Line: PICC  Referring pulmonologist: Rolm Baptise, MD    IV Antibiotics Ordered:  ?? Tobramycin 450 mg IV every 24 hours (NON-CF extended interval dosing)-Please dose in the MORNING  o 11/24-12/1: held due to elevated SCr  o 12/3: tobramycin resumed at lower dose of 450 mg Q24H  ??? Ceftazidime 2g IV every 8 hours    Goal Drug Levels:  ??? Bronchiectasis NON-CF Ext Interval Dosing: End of infusion peak: 18-25 mcg/mL, trough <1 mcg/mL       General Labs     Recent Labs   Lab Units 10/07/20  1520   WBC 10*9/L 7.5   HEMOGLOBIN g/dL 21.3   HEMATOCRIT % 08.6   PLATELET COUNT (1) 10*9/L 271   NEUTRO ABS 10*9/L 5.9   LYMPHO ABS 10*9/L 0.6*   EOSINO ABS 10*9/L 0.2   BASOS ABS 10*9/L 0.1     Recent Labs   Lab Units 10/07/20  1520   BUN mg/dL 18   CREATININE mg/dL 5.78     No results in the last week  Recent Labs   Lab Units 10/07/20  1520   AST U/L 17   ALT U/L 21   ALK PHOS U/L 101   BILIRUBIN TOTAL mg/dL 4.69     Collected Drug Levels     Date Infusion   Time Lab   Time Drug Levels   09/20/20 0900 - 1000 1100 tobramycin peak: 16.2 mcg/mL. drawn ~1 hour(s) after end of infusion time   09/23/20 0900 - 1000 1700 tobramycin random: 0.9 mcg/mL. drawn ~7 hour(s) after end of infusion time   10/07/20 0630 - 0730 1520 tobramycin random: 1.3 mcg/mL. drawn 8 hour(s) after end of infusion time     Wt Readings from Last 3 Encounters:   09/15/20 80.3 kg (177 lb)   09/12/20 78.9 kg (174 lb)   08/20/20 80 kg (176 lb 6.4 oz)     Patient-specific Pharmacokinetics:    Date   Dose Extrapolated levels (mcg/mL)   Vd (L)   L/kg   Ke (hr-1)     Peak Trough        09/23/20   500 mg Q24H   26.2   0.00   13.6   0.17   0.482     10/07/20   450 mg Q24H   pending   pending   pending   pending   pending Recommendations at this time unless clinically indicated otherwise:  ??? Pharmacokinetics pending tobramycin peak with adjusted dose.  ??? Renal function is stable. Other labs WNL and stable.  ??? PLAN:  o Continue tobramycin 450 mg Q24H    o Continue ceftazidime 2 g every 8 hours.    Next Labs:   Due on 10/10/20  ??? Magnesium  ??? Basic Metabolic Panel  ??? Tobramycin Peak Level (collect 1-1.5 hrs after END of infusion time)'    Orders above were communicated to [facility] ([facility]): [phone],Courtney on 10/07/20 at 3:30 PM.     Electronically signed:  Barbette Hair, PharmD, MPH, BCPS, CPP  Clinical Pharmacist Practitioner  [facility]  [phone]    CC:   ??? Rolm Baptise, MD  ??? Arsenio Loader ([facility] Nurse Coordinator)

## 2020-10-09 NOTE — Unmapped (Signed)
Phoned [facility] again. They stated that they had not received lab results either from the lab. They were following up with the lab again. Have notified Seyram, CPP.

## 2020-10-10 LAB — BASIC METABOLIC PANEL
BLOOD UREA NITROGEN: 14 mg/dL
CALCIUM: 9.4 mg/dL
CHLORIDE: 101 mmol/L
CO2: 22 mmol/L
CREATININE: 0.7 mg/dL
GLUCOSE RANDOM: 95 mg/dL
POTASSIUM: 4 mmol/L
SODIUM: 137 mmol/L

## 2020-10-10 LAB — TOBRAMYCIN LEVEL, RANDOM: TOBRAMYCIN RANDOM: 4.5 ug/mL

## 2020-10-10 LAB — MAGNESIUM: MAGNESIUM: 1.9 mg/dL

## 2020-10-10 NOTE — Unmapped (Signed)
Outpatient Lab Results from [facility] on 10/14/20. Imported into Media Tab    Outpatient IV Antimicrobial Therapy:  Start Date: 09/16/2020  End Date: 10/10/2020 (extended from 09/30/2020)  Line: PICC  Referring pulmonologist: Rolm Baptise, MD    IV Antibiotics Ordered:  ??? Tobramycin 450 mgmg IV every 24 hours (NON-CF extended interval dosing)-Please dose in the MORNING       10/10/20  Patient was called to discuss start and stop time of Tobramycin on date of drug level collection. Confirmed with patient dosing times of antibiotics and approximate time home health nursing was present to collect labs.     Patient Reported Administration Time:   Start of infusion: 08:50 am   End of infusion: 10:05 am   Lab draw time: 1:23 pm     Reviewed with patient:  ??? Urine output: Normal  ??? Flank pain: Denies  ??? Tinnitus/changes in hearing or having imbalance: Denies  ??? Rash: Denies  ??? Nausea/Vomiting/Diarrhea: Denies  ??? Any new medications started: no  ??? OTC NSAID use: no    PLAN     1. Note forwarded to: Rolm Baptise, MD  2. Lab results forwarded to CPP Seyram Mahalia Longest, RN

## 2020-10-11 NOTE — Unmapped (Signed)
Phoned [facility] to provide them with a verbal order per Dr. Garner Nash.    [name] is aware that it may not be until Monday.  We discussed the flushing of his PICC line.

## 2020-10-14 NOTE — Unmapped (Signed)
Called [facility] to follow-up on labs from 12/9.     Have not seen them in fax file.    They looked for results and they had also not received them from [place] lab. They were going to follow-up with lab and fax th results to [place] when received.

## 2020-10-15 NOTE — Unmapped (Signed)
Bronchiectasis Clinic Pharmacist Telehealth Note     Reason for visit: Outpatient Home IV Antibiotic Lab Monitoring, results from 10/10/20    Outpatient IV Antimicrobial Therapy:  Start Date: 09/16/2020  End Date: 10/11/2020 (extended from 09/30/20)  Line: PICC  Referring pulmonologist: Rolm Baptise, MD    IV Antibiotics Ordered:  ?? Tobramycin 450 mg IV every 24 hours (NON-CF extended interval dosing)-Please dose in the MORNING  o 11/24-12/1: held due to elevated SCr  o 12/3: tobramycin resumed at lower dose of 450 mg Q24H  ??? Ceftazidime 2g IV every 8 hours    Goal Drug Levels:  ??? Bronchiectasis NON-CF Ext Interval Dosing: End of infusion peak: 18-25 mcg/mL, trough <1 mcg/mL       General Labs     No results in the last week  Recent Labs   Lab Units 10/10/20  1300   BUN mg/dL 14   CREATININE mg/dL 9.81     Recent Labs   Lab Units 10/10/20  1300   MAGNESIUM mg/dL 1.9     No results in the last week  Collected Drug Levels     Date Infusion   Time Lab   Time Drug Levels   09/20/20 0900 - 1000 1100 tobramycin peak: 16.2 mcg/mL. drawn ~1 hour(s) after end of infusion time   09/23/20 0900 - 1000 1700 tobramycin random: 0.9 mcg/mL. drawn ~7 hour(s) after end of infusion time   10/07/20 0630 - 0730 1520 tobramycin random: 1.3 mcg/mL. drawn 8 hour(s) after end of infusion time   10/10/20 0850 - 1005 1323 tobramycin peak: 4.5 mcg/mL. drawn 3.25 hour(s) after end of infusion time (not a true peak)     Wt Readings from Last 3 Encounters:   09/15/20 80.3 kg (177 lb)   09/12/20 78.9 kg (174 lb)   08/20/20 80 kg (176 lb 6.4 oz)     Patient-specific Pharmacokinetics:    Date   Dose Extrapolated levels (mcg/mL)   Vd (L)   L/kg   Ke (hr-1)     Peak Trough        09/23/20   500 mg Q24H   26.2   0.00   13.6   0.17   0.482     10/10/20   450 mg Q24H   10.5   0.03   37.7   0.47   0.261     Recommendations at this time unless clinically indicated otherwise:  ??? Tobramycin peak subtherapeutic (potentially due to peak drawn two hours late)  ??? Renal function is stable. Other labs WNL and stable.  ??? Patient completed IV antibiotic course on 10/11/2020.     Electronically signed:  Barbette Hair, PharmD, MPH, BCPS, CPP  Clinical Pharmacist Practitioner  [facility]  [phone]    CC:   ??? Rolm Baptise, MD  ??? Arsenio Loader ([facility] Nurse Coordinator)

## 2020-10-22 MED ORDER — METFORMIN ER 500 MG TABLET,EXTENDED RELEASE 24 HR
ORAL_TABLET | Freq: Every day | ORAL | 3 refills | 90 days | Status: CP
Start: 2020-10-22 — End: 2021-10-22
  Filled 2020-10-28: qty 360, 90d supply, fill #0

## 2020-10-28 MED FILL — METFORMIN ER 500 MG TABLET,EXTENDED RELEASE 24 HR: 90 days supply | Qty: 360 | Fill #0 | Status: AC

## 2020-10-28 MED FILL — ATORVASTATIN 20 MG TABLET: ORAL | 30 days supply | Qty: 30 | Fill #6

## 2020-10-28 MED FILL — ATORVASTATIN 20 MG TABLET: 30 days supply | Qty: 30 | Fill #6 | Status: AC

## 2020-10-30 NOTE — Unmapped (Signed)
Spoke to [name].. he is reporting some increasing cough and at times yellow sputum.    Not reporting shortness of breath.    He states he still feels a lot better than he did before.    Went ahead and started back on his [place].     He continues on his other respiratory medications.     Will relay to Dr. Garner Nash. They know to call me if any of his symptoms worsen.

## 2020-11-14 ENCOUNTER — Encounter: Admit: 2020-11-14 | Discharge: 2020-11-14 | Payer: PRIVATE HEALTH INSURANCE

## 2020-11-14 DIAGNOSIS — Z148 Genetic carrier of other disease: Principal | ICD-10-CM

## 2020-11-14 DIAGNOSIS — J470 Bronchiectasis with acute lower respiratory infection: Principal | ICD-10-CM

## 2020-11-14 DIAGNOSIS — E119 Type 2 diabetes mellitus without complications: Principal | ICD-10-CM

## 2020-11-14 DIAGNOSIS — J479 Bronchiectasis, uncomplicated: Principal | ICD-10-CM

## 2020-11-14 DIAGNOSIS — G4733 Obstructive sleep apnea (adult) (pediatric): Principal | ICD-10-CM

## 2020-11-14 DIAGNOSIS — R6339 Other feeding difficulties: Principal | ICD-10-CM

## 2020-11-14 DIAGNOSIS — Z794 Long term (current) use of insulin: Principal | ICD-10-CM

## 2020-11-14 LAB — ALBUMIN / CREATININE URINE RATIO
ALBUMIN QUANT URINE: <0.3 mg/dL
CREATININE, URINE: 59.8 mg/dL

## 2020-11-14 MED ORDER — COLISTIN (COLISTIMETHATE SODIUM) 150 MG SOLUTION FOR INJECTION
5 refills | 0 days | Status: CP
Start: 2020-11-14 — End: ?
  Filled 2020-12-04: qty 112, 56d supply, fill #0

## 2020-11-14 MED ORDER — BD LUER-LOK SYRINGE 3 ML 20 GAUGE X 1"
5 refills | 0 days | Status: CP
Start: 2020-11-14 — End: 2020-11-16

## 2020-11-14 MED ORDER — SPIRIVA RESPIMAT 2.5 MCG/ACTUATION SOLUTION FOR INHALATION
Freq: Every day | RESPIRATORY_TRACT | 11 refills | 0.00000 days | Status: CP
Start: 2020-11-14 — End: ?

## 2020-11-14 MED ORDER — WATER FOR INJECTION, STERILE INJECTION SOLUTION
ORAL | 5 refills | 0.00000 days | Status: CP
Start: 2020-11-14 — End: 2020-11-16

## 2020-11-14 MED FILL — ADVAIR HFA 230 MCG-21 MCG/ACTUATION AEROSOL INHALER: RESPIRATORY_TRACT | 90 days supply | Qty: 36 | Fill #0

## 2020-11-14 MED FILL — ATORVASTATIN 20 MG TABLET: ORAL | 30 days supply | Qty: 30 | Fill #7

## 2020-11-14 NOTE — Unmapped (Addendum)
Sent new scripts for Spiriva and colistin to [facility].  He should use the colistin on the months he is off the [place].    ----- Message from Barbette Hair, CPP sent at 11/13/2020  6:40 PM EST -----  Will defer to Dr. Garner Nash as I'm not sure the plan regarding inhaled colistin.  ----- Message -----  From: Carney Corners, RN  Sent: 11/13/2020   2:49 PM EST  To: Truett Mainland, MD, #    Thank you Ryan!  ----- Message -----  From: Rhodia Albright  Sent: 11/13/2020   2:40 PM EST  To: [name], RN, #      I was able to speak to the patient and set up Advair for delivery. Thank you.  ----- Message -----  From: Amil Amen  Sent: 11/13/2020   9:11 AM EST  To: Carney Corners, RN, #    Hey,    The Ma's called yesterday regarding Mr. Bailey Mech new insurance.  They are interested in getting his scripts again with his insurance at [facility].    His main question was getting the colistimethate again.  They hope that with the new insurance it will be cheaper.  I told him I would reach out since there is no longer an active script on file and I wasn't sure what his current treatment plan was.    He would also need a new script for the Spiriva sent in as well.     Ryan-The only thing he said he needed right now was the Advair.  Could you get that filled for him? And if possible a 90 day supply.     Thanks for the help!  Jas

## 2020-11-14 NOTE — Unmapped (Signed)
[facility]       Type of visit:  face to face    Reason for visit: f/u    Questions / Concerns that need to be addressed: f/u    General Consent to Treat (GCT) for non-epic video visits only: Verbal consent      Screening BP- 115/65    Allergies reviewed: Yes    Medication reviewed: Yes  Pended refills? No        HCDM reviewed and updated in Epic:    We are working to make sure all of our patients??? wishes are updated in Epic and part of that is documenting a Environmental health practitioner for each patient  A Health Care Decision Rodena Piety is someone you choose who can make health care decisions for you if you are not able ??? who would you most want to do this for you????  is already up to date.        BPAs completed:  PHQ9  GAD7  AUDIT - Alcohol Screen  HARK - Interpersonal Violence      COVID Vaccination:  If Care Gap for COVID vaccine is present:  Have you received a COVID vaccine? yes  If yes: How many doses have you received? 0/1/2: 2   Type of Vaccine received? Moderna   Date of 1st dose:    Date of 2nd dose:    If no: Are you interested in scheduling?         __________________________________________________________________________________________    SCREENINGS COMPLETED IN FLOWSHEETS    HARK Screening  HARK Screening  Within the last year, have you been humiliated or emotionally abused in other ways by your partner or ex-partner?: No  Within the last year, have you been afraid of your partner or ex-partner?: No  Within the last year, have you been raped or forced to have any kind of sexual activity by your partner or ex-partner?: No  Within the last year, have you been kicked, hit, slapped, or otherwise physically hurt by your partner or ex-partner?: No    AUDIT  AUDIT - C Score (Part 1): 0    PHQ9  Thoughts that you would be better off dead, or of hurting yourself in some way: Not at all  PHQ-9 TOTAL SCORE: 0

## 2020-11-14 NOTE — Unmapped (Signed)
Thank you for coming in to the office today! We are happy you decided to establish care with Brett Hanna!    Try to wean oxygen at home if you can - try 3L to see our your level is. Goal >94%.    Follow up with Dr Garner Nash in Feb     Will talk about Colonoscopy at next visit    Must take handicap paperwork to the [facility].    If you have any questions or concerns please use MyChart messaging or call the office to schedule a new appointment. See you next time!    -Sanjuan Dame, MD  W/ Dr. Darnelle Spangle

## 2020-11-14 NOTE — Unmapped (Signed)
Internal Medicine Clinic Visit    Reason for visit: DM and hospital follow up     A/P:  55 yo male here for DM follow up and to discussion recent hospitalization.    1. Type 2 diabetes mellitus without complication, without long-term current use of insulin (CMS-HCC)  2. Bronchiectasis with acute lower respiratory infection (CMS-HCC)  3. Alpha-1-antitrypsin deficiency carrier  4. Obstructive sleep apnea syndrome  5. Polycythemia  6. HM    Bronchiectasis/alpha 1 antitrypsin carrier. Follows closely with pulmonology. Finished IV antibiotics on 12/10 after hospital stay on 11/14-17 for flare. Still on 4L [place]. Still is desatting on RA at this time. He continues his twice a day chest physiotherapy. Spiriva has helped some in the wheezing. No hemoptysis at this time. Goal is to completely wean off O2.  - continue chest PT  - follow up with Dr. Garner Nash  - breath treatments  - attempt to wean O2 as tolerated with goal SpO2 >94%  - filled out paperwork for handicap pass for vehicle while on O2 - will need to present to [facility]  ??  DM2. UTD on A1C (6.5 in 10/21)   - Eye exam today   - Albumin/creatinine urine ratio  - A1C at next visit    OSA - Polycythemia on labs. - labs from a month ago are stable. Has new CPAP and currently on 4L O2 from recent bronchiectasis flare.   - CTM at future visits     HM: Colon polyps. Due for colonoscopy follow up. He wants to wait until his breathing is better and COVID #s down. Will continue to re-visit.  - plan for colo in march or April   - told patient to continue discussion about COVID booster at next visit with Dr. Garner Nash (6 months/due for booster at 11/23/20)  __________________________________________________________    HPI:  Brett Hanna is a 55 yo male with a PMhx of Bronchiectasis in the setting of alpha-1-antitrypsin def carrier state, T2DM, OSA that was recently in the hospital follows up in clinic for DM managament and hospital follow.    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  - started on tobramycin and ceftazidime - PICC line placed for 2 weeks of abx  - discharged home to finish abx course  -CT chest w/o on 11/14: Interval progression of bilateral basilar and lingular predominant bronchiectasis and bronchiolectasis. --Interval increase in diffuse bilateral tree-in-bud and nodular opacities, likely sequelae of mucoid impaction of the distal airways, although superimposed infection cannot be excluded.  - Completed IV abx on 12/10 - extended from original end date of 09/30/20    Social Hx:   - good holiday season - got to see the grandchildren     Checking on mood - was on sertaline (fell off his chart) - have discussed start venlafaxine in the past   -Best guess is he has been off it a couple months - feels like he does not need it anymore now that the infection is better controlled. Mood very driven by infections.    Bronchiectasis   12/29 telephone call: reported some increasing cough and occasionally productive with yellow sputum. Denied SOB. Still felt a lot better than he did before from hospitalization. They restarted his [place] and continued on his other respiratory medications.   - got a lot better once placed back on [place]   - on 4L [place] - 91-92%, 88-90%  on RA at rest and down to 85% with ambulation on RA- occasionally drop to the 70's on RA  - still on 4L [place] now - satting 94% in the office during visit  - follow up with CPAP - 3 months ago he received a new  One. uses every night and hooks the O2 to it at 4L    Needs follow up with GI for colo -  father had colon cancer in his 82s. The patient had multiple very small polyps in 2013 - repeat in 2016 was negative for polyps but found diverticulosis in the sigmoid colon - repeat for 5 years   - wishes to wait for covid numbers to improve - aiming for march or April     HM   - not due for A1C today - he gets Q6 months check - last one 08/20/20 was 6.5  - >5 months out from second COVID shot (05/23/20) - would rather have COVID - felt worse with the vaccines than having the actually virus     __________________________________________________________    Problem List:  Patient Active Problem List   Diagnosis   ??? Allergic rhinitis   ??? Alpha-1-antitrypsin deficiency carrier   ??? Benign neoplasm of colon   ??? Hyperlipidemia   ??? Obstructive sleep apnea syndrome   ??? Chronic sinusitis   ??? Vitamin D deficiency   ??? Type 2 diabetes mellitus without complication, without long-term current use of insulin (CMS-HCC)   ??? Uncomplicated severe persistent asthma   ??? Overweight   ??? Migraine with aura and without status migrainosus, not intractable   ??? Gastroesophageal reflux disease without esophagitis   ??? Flexural eczema   ??? Anxiety   ??? Fatty liver   ??? Cough   ??? Burkholderia gladioli culture positive   ??? COVID-19       Medications:  Reviewed in EPIC  __________________________________________________________    Physical Exam:   Vital Signs:  Vitals:    11/14/20 1349   BP: 115/65   Pulse: 78   Resp: 18   Temp: 36.9 ??C   TempSrc: Tympanic   SpO2: 95%   Weight: 81.7 kg (180 lb 3.2 oz)   Height: 167.6 cm (5' 6)       Gen: WDWN middle aged male sitting in a chair with O2 [place] on in NAD  CV: RRR, no murmurs  Pulm: bilateral Rhonchi diffusely, elicited productive cough with deep inhalation  Abd: Soft, NTND, normal BS  Ext: No edema

## 2020-11-16 MED ORDER — SODIUM CHLORIDE 0.9 % (FLUSH) INJECTION SYRINGE
5 refills | 0.00000 days | Status: CP
Start: 2020-11-16 — End: ?
  Filled 2020-12-04: qty 180, 30d supply, fill #0

## 2020-11-16 MED ORDER — NEEDLE (DISP) 21 GAUGE X 1 1/2"
5 refills | 0.00000 days | Status: CP
Start: 2020-11-16 — End: ?

## 2020-11-17 NOTE — Unmapped (Signed)
Addended by: Viona Gilmore on: 11/16/2020 05:09 PM     Modules accepted: Orders

## 2020-11-19 DIAGNOSIS — J479 Bronchiectasis, uncomplicated: Principal | ICD-10-CM

## 2020-11-20 NOTE — Unmapped (Signed)
[facility] Specialty Medication Onboarding    Specialty Medication: Colistimethate  Prior Authorization: Not Required   Financial Assistance: No - copay card or gant not available   Final Copay/Day Supply: $99.00 / 28    Insurance Restrictions: None     Notes to Pharmacist: Over 6 months since last fill    The triage team has completed the benefits investigation and has determined that the patient is able to fill this medication at [facility]. Please contact the patient to complete the onboarding or follow up with the prescribing physician as needed.

## 2020-11-22 DIAGNOSIS — G4733 Obstructive sleep apnea (adult) (pediatric): Principal | ICD-10-CM

## 2020-11-22 NOTE — Unmapped (Signed)
Addended by: Viona Gilmore on: 11/22/2020 11:58 AM     Modules accepted: Orders

## 2020-12-02 DIAGNOSIS — J479 Bronchiectasis, uncomplicated: Principal | ICD-10-CM

## 2020-12-02 NOTE — Unmapped (Signed)
[facility] Clinical Assessment & Refill Coordination Note    Tier Exception was approved through insurance and patient will fill Colistin for $10    Brett Hanna Sr., DOB: Aug 16, 1966  Phone: [phone] (home)     All above HIPAA information was verified with patient.     Was a Nurse, learning disability used for this call? No    Specialty Medication(s):   CF/Pulmonary: -Nebulized colistin (150mg  vials) and supply kit     Current Outpatient Medications   Medication Sig Dispense Refill   ??? acetaminophen (TYLENOL EXTRA STRENGTH) 500 MG tablet Take 1 tablet by mouth as needed for pain     ??? albuterol 2.5 mg /3 mL (0.083 %) nebulizer solution Inhale the contents of 1 vial (3 mL) via nebulizer twice daily and every 6 hours as needed for shortness of breath and wheezing 360 mL 14   ??? albuterol HFA 90 mcg/actuation inhaler Inhale 2 puffs every four (4) hours as needed for wheezing 18 g 11   ??? atorvastatin (LIPITOR) 20 MG tablet Take 1 tablet (20 mg total) by mouth daily. 30 tablet 11   ??? azithromycin (ZITHROMAX) 250 MG tablet Take 1 tablet (250 mg total) by mouth daily. 30 tablet 11   ??? aztreonam lysine (CAYSTON) 75 mg/mL Nebu nebulization solution Inhale 1 mL (75 mg total) by nebulization every eight (8) hours. Nebulize every other month after airway clearance using Altera nebulizer 84 mL 5   ??? blood-glucose meter kit Check sugars once daily. 1 each 0   ??? cetirizine (ZYRTEC) 10 MG tablet Take 1 tablet (10 mg total) by mouth daily. 90 tablet 3   ??? cholecalciferol, vitamin D3, (VITAMIN D3) 2,000 unit cap Take 1 capsule (2,000 Units total) by mouth daily. 30 capsule prn   ??? colistimethate (COLYMYCIN) 150 mg injection Mix 2mL of colistin with 3mL sterile water. Nebulize twice daily for 28 days every other month 112 mL 5   ??? empagliflozin (JARDIANCE) 25 mg tablet Take 1 tablet (25 mg total) by mouth daily. 90 tablet 3   ??? fexofenadine (ALLEGRA) 180 MG tablet Take 1 tablet (180 mg total) by mouth daily. (Patient taking differently: Take 180 mg by mouth daily. Alternates with zyrtec) 90 tablet 3   ??? fluticasone propion-salmeteroL (ADVAIR HFA) 230-21 mcg/actuation inhaler Inhale 2 puffs Two (2) times a day. (Patient taking differently: Inhale 2 puffs Two (2) times a day. Filling at [facility] 03/07/20) 36 g 3   ??? fluticasone propionate (FLONASE) 50 mcg/actuation nasal spray Use 2 sprays in each nostril route daily. 16 g 11   ??? L. rhamnosus GG/inulin (CULTURELLE PROBIOTICS ORAL) Take 1 capsule by mouth daily.     ??? lancets Misc 1 each by Other route once daily. Test daily before breakfast. Dispense 90 day supply. 100 each 3   ??? metFORMIN (GLUCOPHAGE-XR) 500 MG 24 hr tablet Take 4 tablets (2,000 mg total) by mouth daily with evening meal. 360 tablet 3   ??? MISCELLANEOUS MEDICAL SUPPLY MISC Frequency:PHARMDIR   Dosage:0.0     Instructions:  Note:CPAP 15 cm H20 with heated humidity for nasal dryness.  Mask: ResMed Quattro size Medium. Dx OSA. Dose: 1     ??? montelukast (SINGULAIR) 10 mg tablet Take 1 tablet (10 mg total) by mouth nightly. 90 tablet 3   ??? nebulizers Misc Use with inhaled medication. 3 each 3   ??? needle, disp, 21 G 21 gauge x 1 1/2 Ndle Use to draw up inject normal  saline into colistin vial and to withdraw colistin dose for inhalation 100 each 5   ??? omeprazole (PRILOSEC) 20 MG capsule Take 1 capsule (20 mg total) by mouth two (2) times a day. 180 capsule 3   ??? sod bicarb-sod chlor-neti pot (AYR SALINE NASAL NETI RINSE) pkdv Use twice per day     ??? sodium chloride (NS) 0.9 % injection Inject 2mL of 0.9%NaCl into colistin vial & gently mix. After withdrawing colistin dose, add an additional 1mL of 0.9%NaCl to neb cup with the colistin dose. 180 mL 5   ??? sodium chloride 7% 7 % Nebu Inhale 4 mL by nebulization Two (2) times a day. 720 mL 3   ??? sulfamethoxazole-trimethoprim (BACTRIM) 400-80 mg per tablet Take 1 tablet (80 mg of trimethoprim total) by mouth Two (2) times a day. 180 tablet 3   ??? tiotropium bromide (SPIRIVA RESPIMAT) 2.5 mcg/actuation inhalation mist Inhale 2 puffs daily. 4 g 11     No current facility-administered medications for this visit.        Changes to medications: [name] reports no changes at this time.    Allergies   Allergen Reactions   ??? Codeine Shortness Of Breath and Rash   ??? Rifampin      Fevers, chills.   ??? Bamlanivimab Other (See Comments)     Low Back pain   ??? Daliresp [Roflumilast] Other (See Comments)     Back pain       Changes to allergies: No    SPECIALTY MEDICATION ADHERENCE     Colistin 150 mg: 0 days of medicine on hand       Medication Adherence    Patient reported X missed doses in the last month: all  Specialty Medication: colistin 150mg    Patient is on additional specialty medications: No  Informant: patient  Support network for adherence: family member          Specialty medication(s) dose(s) confirmed: Regimen is correct and unchanged.     Are there any concerns with adherence? Yes: patient will not ill colistin anymore due to cost    Adherence counseling provided? will route to team    CLINICAL MANAGEMENT AND INTERVENTION      Clinical Benefit Assessment:    Do you feel the medicine is effective or helping your condition? Patient declined to answer    Clinical Benefit counseling provided? consulted provider regarding clinical benefit concerns    Adverse Effects Assessment:    Are you experiencing any side effects? No    Are you experiencing difficulty administering your medicine? No    Quality of Life Assessment:    How many days over the past month did your bronchiectasis  keep you from your normal activities? For example, brushing your teeth or getting up in the morning. Patient declined to answer    Have you discussed this with your provider? No - pharmacist will consult provider    Therapy Appropriateness:    Is therapy appropriate? Pharmacist will consult provider    DISEASE/MEDICATION-SPECIFIC INFORMATION      N/A    PATIENT SPECIFIC NEEDS     - Does the patient have any physical, cognitive, or cultural barriers? No    - Is the patient high risk? No    - Does the patient require a Care Management Plan? No     - Does the patient require physician intervention or other additional services (i.e. nutrition, smoking cessation, social work)? No      SHIPPING  Specialty Medication(s) to be Shipped:   Colistin 150mg      Other medication(s) to be shipped: [employer], NS Posiflush, Needles.      Changes to insurance: No    Delivery Scheduled: Yes, Expected medication delivery date: 2/3.     Medication will be delivered via UPS to the confirmed prescription address in [facility].    The patient will receive a drug information handout for each medication shipped and additional FDA Medication Guides as required.  Verified that patient has previously received a Conservation officer, historic buildings.    All of the patient's questions and concerns have been addressed.    Julianne Rice   [facility] Specialty Pharmacist

## 2020-12-03 MED ORDER — NEBULIZERS
5 refills | 0 days
Start: 2020-12-03 — End: ?

## 2020-12-03 MED FILL — VENTOLIN HFA 90 MCG/ACTUATION AEROSOL INHALER: RESPIRATORY_TRACT | 17 days supply | Qty: 18 | Fill #0

## 2020-12-04 MED FILL — LC PLUS MISC: 56 days supply | Qty: 1 | Fill #0

## 2020-12-04 MED FILL — BD REGULAR BEVEL NEEDLES 21 GAUGE X 1 1/2": 30 days supply | Qty: 60 | Fill #0

## 2020-12-04 NOTE — Unmapped (Signed)
Spoke to Brett Hanna and his wife today.     They asked if they could speak with financial coordinator. Let them know that I will let her know to give them a call.     Also asked if they were able to come at 0800 on 2/11 for their PFTs as that was the only opening that day.     Brett Hanna said they would be able to be here that early.     Will notify Carolanne Grumbling, RRT.

## 2020-12-06 DIAGNOSIS — J471 Bronchiectasis with (acute) exacerbation: Principal | ICD-10-CM

## 2020-12-06 MED ORDER — ALBUTEROL SULFATE 2.5 MG/3 ML (0.083 %) SOLUTION FOR NEBULIZATION
5 refills | 0 days | Status: CP
Start: 2020-12-06 — End: ?
  Filled 2020-12-12: qty 360, 30d supply, fill #0

## 2020-12-06 NOTE — Unmapped (Signed)
Pharmacy requesting refill    Last visit: 09/12/2020  Next visit: 12/13/2020    RX routed to Dr. Garner Nash for co-sign.  Verified with office notes    Requested Prescriptions     Pending Prescriptions Disp Refills   ??? albuterol 2.5 mg /3 mL (0.083 %) nebulizer solution 360 mL 5     Sig: Inhale the contents of 1 vial (3 ml) via nebulizer twice daily and every 6 hours as needed for shortness of breath and wheezing

## 2020-12-12 DIAGNOSIS — J479 Bronchiectasis, uncomplicated: Principal | ICD-10-CM

## 2020-12-12 MED FILL — FLUTICASONE PROPIONATE 50 MCG/ACTUATION NASAL SPRAY,SUSPENSION: NASAL | 30 days supply | Qty: 16 | Fill #6

## 2020-12-12 MED FILL — ATORVASTATIN 20 MG TABLET: ORAL | 30 days supply | Qty: 30 | Fill #8

## 2020-12-12 MED FILL — SODIUM CHLORIDE 7 % FOR NEBULIZATION: RESPIRATORY_TRACT | 90 days supply | Qty: 720 | Fill #2

## 2020-12-13 ENCOUNTER — Encounter: Admit: 2020-12-13 | Discharge: 2020-12-14 | Payer: PRIVATE HEALTH INSURANCE

## 2020-12-13 ENCOUNTER — Encounter
Admit: 2020-12-13 | Discharge: 2020-12-14 | Payer: PRIVATE HEALTH INSURANCE | Attending: Internal Medicine | Primary: Internal Medicine

## 2020-12-13 DIAGNOSIS — Z79899 Other long term (current) drug therapy: Principal | ICD-10-CM

## 2020-12-13 DIAGNOSIS — E8801 Alpha-1-antitrypsin deficiency: Principal | ICD-10-CM

## 2020-12-13 DIAGNOSIS — M542 Cervicalgia: Principal | ICD-10-CM

## 2020-12-13 DIAGNOSIS — R1900 Intra-abdominal and pelvic swelling, mass and lump, unspecified site: Principal | ICD-10-CM

## 2020-12-13 DIAGNOSIS — Z7951 Long term (current) use of inhaled steroids: Principal | ICD-10-CM

## 2020-12-13 DIAGNOSIS — R0902 Hypoxemia: Principal | ICD-10-CM

## 2020-12-13 DIAGNOSIS — J479 Bronchiectasis, uncomplicated: Principal | ICD-10-CM

## 2020-12-13 DIAGNOSIS — E119 Type 2 diabetes mellitus without complications: Principal | ICD-10-CM

## 2020-12-13 DIAGNOSIS — J470 Bronchiectasis with acute lower respiratory infection: Principal | ICD-10-CM

## 2020-12-13 DIAGNOSIS — Z148 Genetic carrier of other disease: Principal | ICD-10-CM

## 2020-12-13 DIAGNOSIS — R0600 Dyspnea, unspecified: Principal | ICD-10-CM

## 2020-12-13 DIAGNOSIS — Z7984 Long term (current) use of oral hypoglycemic drugs: Principal | ICD-10-CM

## 2020-12-13 DIAGNOSIS — J9611 Chronic respiratory failure with hypoxia: Principal | ICD-10-CM

## 2020-12-13 DIAGNOSIS — A498 Other bacterial infections of unspecified site: Principal | ICD-10-CM

## 2020-12-13 DIAGNOSIS — K760 Fatty (change of) liver, not elsewhere classified: Principal | ICD-10-CM

## 2020-12-13 DIAGNOSIS — Z800 Family history of malignant neoplasm of digestive organs: Principal | ICD-10-CM

## 2020-12-13 LAB — CBC W/ AUTO DIFF
BASOPHILS ABSOLUTE COUNT: 0.1 10*9/L (ref 0.0–0.1)
BASOPHILS RELATIVE PERCENT: 1.1 %
EOSINOPHILS ABSOLUTE COUNT: 0.1 10*9/L (ref 0.0–0.7)
EOSINOPHILS RELATIVE PERCENT: 1.7 %
HEMATOCRIT: 47 % (ref 38.0–50.0)
HEMOGLOBIN: 15.5 g/dL (ref 13.5–17.5)
LYMPHOCYTES ABSOLUTE COUNT: 0.6 10*9/L — ABNORMAL LOW (ref 0.7–4.0)
LYMPHOCYTES RELATIVE PERCENT: 8.3 %
MEAN CORPUSCULAR HEMOGLOBIN CONC: 32.9 g/dL (ref 30.0–36.0)
MEAN CORPUSCULAR HEMOGLOBIN: 27.9 pg (ref 26.0–34.0)
MEAN CORPUSCULAR VOLUME: 84.8 fL (ref 81.0–95.0)
MEAN PLATELET VOLUME: 7.6 fL (ref 7.0–10.0)
MONOCYTES ABSOLUTE COUNT: 0.7 10*9/L (ref 0.1–1.0)
MONOCYTES RELATIVE PERCENT: 9.7 %
NEUTROPHILS ABSOLUTE COUNT: 5.7 10*9/L (ref 1.7–7.7)
NEUTROPHILS RELATIVE PERCENT: 79.2 %
PLATELET COUNT: 267 10*9/L (ref 150–450)
RED BLOOD CELL COUNT: 5.55 10*12/L (ref 4.32–5.72)
RED CELL DISTRIBUTION WIDTH: 16.2 % — ABNORMAL HIGH (ref 12.0–15.0)
WBC ADJUSTED: 7.2 10*9/L (ref 3.5–10.5)

## 2020-12-13 LAB — B-TYPE NATRIURETIC PEPTIDE: B-TYPE NATRIURETIC PEPTIDE: 8.85 pg/mL (ref <=100)

## 2020-12-13 MED ORDER — CIPROFLOXACIN 750 MG TABLET
ORAL_TABLET | Freq: Two times a day (BID) | ORAL | 0 refills | 14 days | Status: CP
Start: 2020-12-13 — End: 2020-12-27
  Filled 2020-12-13: qty 28, 14d supply, fill #0

## 2020-12-13 NOTE — Unmapped (Addendum)
Checking blood work today. Echocardiogram.    Starting Cipro for the Pseudomonas.    Checking overnight oximetry on your CPAP with the oxygen. Use 3L with activity.    Recommend getting the COVID booster.  Let me know when you receive it.    Symptoms of a bronchiectasis exacerbation: 48 hours of at least three of the following:  ?? Increased cough  ?? Change in volume or appearance of sputum  ?? Increased sputum purulence  ?? Worsening shortness of breath and/or exercise tolerance  ?? Fatigue and/or malaise  ?? Coughing up blood (hemoptysis)    If you are experiencing some of these symptoms:  ?? Increase the frequency and/or intensity of your airway clearance  ?? Try to submit a sputum sample for bacterial and AFB cultures (at [facility] or locally)  ?? Reach out to me or your local physician.  ?? If you need antibiotics, recommend treating for 14 days.      Please bring any new airway clearance equipment to your next visit to ensure that you are using and caring for it properly.    Information about getting your COVID-19 vaccine or COVID-19 treatment: yourshot.org    Interested in participating in trials for a COVID-19 vaccine or treatments for COVID-19? https://www.coronaviruspreventionnetwork.org    Thank you for allowing me to be a part of your care. Please call the clinic with any questions.    Viona Gilmore, MD, MPH  Pulmonary and Critical Care Medicine  [address]  [id number]  [place], [place] [address]    Thank you for your visit to the [facility]. You may receive a survey from [facility] regarding your visit today, and we are eager to use this feedback to improve your experience. Thank you for taking the time to fill it out.    Between appointments, you can reach [place] at these numbers:  ??? For appointments: [phone]  ??? For my nurse, Arsenio Loader RN: [phone]  ??? Fax: [phone]  ??? For urgent issues after hours: Hospital Operator @ [phone] & ask for Pulmonary Fellow on call    For further information, check out the websites below:    [facility] for Bronchiectasis: [email or web address]    Impact Airway Clearance Education: http://www.impact-be.com    Videos from the patient session of the World Bronchiectasis Conference in [place] on May 15, 2017 can be watched here (TrustyNews.es).     Bronchiectasis Toolbox (information about airway clearance): DiningCalendar.de    Copy for Individuals with Bronchiectasis and/or NTM through the Bronchiectasis Registry and the COPD Foundation: https://www.mckee-young.org/    Information about Non-tuberculous Mycobacterial Infections:  https://www.ntminfo.org     Interested in clinical trials and other research opportunities?  www.clinicaltrials.gov    The Rare Diseases Clinical Research Network [facility]) Genetic Disorders of  Mucociliary Clearance Consortium [facility]) Contact Registry is a way for patients with disorders of mucociliary clearance (such as bronchiectasis) and their family members to learn about research studies they may be able to join. Participation is completely voluntary and you may choose to withdraw at any time. There is no cost to join the [employer]. For more information or to join the registry please go to the following website:  [email or web address]

## 2020-12-13 NOTE — Unmapped (Signed)
Patient says that his CPAP mask leaks sometimes when he moves at night.  It isn't a lot but wakes him up because it is blowing in his eyes.  He says he has lost quite a bit of weight and we discussed it could be that he needs a smaller mask.  He will call about getting a smaller mask.  No other questions at this time.

## 2020-12-13 NOTE — Unmapped (Signed)
[facility]    12/13/20    9:39 AM    Assessment:      Patient:Brett Brett Hanna Adamcik Sr. (May 23, 1966)    Brett Hanna is a 55 y.o. male who is seen for follow-up of bronchiectasis secondary to alpha-1 antitrypsin carrier state with prior NTM infection status post treatment, OSA, and Burkholderia gladioli colonization.  He is now growing PsA again; this is not surprising since he grew it in 2012. I am concerned about his chronic hypoxemic respiratory failure and lower extremity edema. I wonder if he has an element of heart failure. Additionally, with his fatty liver disease and A1AT MZ carrier state, he is more at risk for developing cirrhosis.  This could lead to PHTN and shunting.     Plan:      Problem List Items Addressed This Visit        Respiratory    Bronchiectasis without complication (CMS-HCC) - Primary    Relevant Orders    Lower Respiratory Culture    AFB culture       Digestive    Fatty liver    Relevant Orders    US Liver with Elastography    Echocardiogram W Colorflow Spectral Doppler With Contrast       Other    Alpha-1-antitrypsin deficiency carrier    Relevant Orders    US Liver with Elastography    Echocardiogram W Colorflow Spectral Doppler With Contrast    Burkholderia gladioli culture positive      Other Visit Diagnoses     Bronchiectasis with acute lower respiratory infection (CMS-HCC)        Relevant Orders    IgE Total    Comprehensive Environmental Panel    Pseudomonas infection        Relevant Medications    ciprofloxacin HCl (CIPRO) 750 MG tablet    Chronic respiratory failure with hypoxia (CMS-HCC)        Relevant Orders    Echocardiogram W Colorflow Spectral Doppler    B-type Natriuretic Peptide (Completed)    CBC w/ Differential (Completed)    Home Medical Equipment    Echocardiogram W Colorflow Spectral Doppler With Contrast    Dyspnea, unspecified type         Relevant Orders    Echocardiogram W Colorflow Spectral Doppler    B-type Natriuretic Peptide (Completed)    Echocardiogram W Colorflow Spectral Doppler With Contrast          Patient met with the respiratory therapist today to discuss home NIPPV equipment. No changes were made to airway clearance regimen.  No changes were made to inhaled medications. He was advised to use 3L supplemental oxygen with exertion.  This doesn't just mean walking around but also includes lifting things and showering.    Patient was unable to expectorate sputum today to be sent for bacterial and AFB cultures.  If he has future exacerbations, sputum should be sent for bacterial and AFB cultures. The bacterial culture should be processed like a cystic fibrosis sample given the overlapping pathogens. There are standing orders for these cultures in our system.    Prescribed 2 week course of Ciprofloxacin to take in addition to inhaled antibiotics to cover the PsA.    Checked BNP to assess right heart strain. Also ordered ECHO - will change to echo with bubble to assess for shunt.    Ordered IgE and comprehensive environmental panel since grew Aspergillus. This is not an infrequent finding on cultures but want  to be sure not driving atopic process.    Ordered liver US with elastography to assess for cirrhosis/fibrosis.     Checking overnight oximetry on CPAP with oxygen bleed in at 4L to ensure not hypoxemic.    Discussed utility, safety, and importance of receiving COVID booster.  He and his wife would like him to get his heart checked out first before receiving it since he had chest pain briefly after the second one.    Brett Hanna will return to clinic in 3 months for follow-up with pre-bronchodilator spirometry and sputum cultures.  He will call or send me a message via MyChart if questions or concerns arise before this visit. The above plan was discussed with the patient and he is in agreement.    I personally spent 45 minutes face-to-face and non-face-to-face in the care of this patient, which includes all pre, intra, and post visit time on the date of service.     Subjective:      HPI: Brett Hanna is a 55 y.o. male who is seen for follow-up of bronchiectasis secondary to alpha-1 antitrypsin carrier state with prior NTM infection status post treatment, OSA, and Burkholderia gladioli colonization.    09/05/19:  I last saw him in clinic on 11/01/18. He reports having a terrible time with wheezing. Having more frequent coughing spells with more sputum volume. Better for few hours and then returns. If leans over to tie shoes, starts coughing and chokes on volume of mucus. Then can cough up blood during those spells. Describes hemoptysis as blood mixed with sputum (more than streaks but not frank blood). Can get up in morning and be okay some days. Other days gets coughing spells and then gets headache and can't do anything. Started Cipro yesterday; has taken two doses so far and thinks already helping this morning. Didn't feel like response to Augmentin was sustained.  Supposed to be starting Cayston today but is unsure if he has new supply (has to check with wife). Has another day worth of Colistin.  Tightness over RUQ comes and goes. If bends towards right, gets spasm. Supposed to get flu shot on Friday.  Has questions about whether face shield is just as effective as wearing a mask to prevent COVID infection. Has trouble tolerating wearing a mask while seated - has back pain.    01/09/20:  At his last visit, I treated him with Cipro for 2 weeks for newly cultured Burkholderia gladioli.  He feels like it helps somewhat but did not completely resolve his symptoms.  Subsequent culture in December was positive for B gladioli.  He had COVID-19 diagnosed 10/23/19 and received a monoclonal antibody therapy.  He tells me today that he does not think that the therapy was very helpful to him; his wife disagrees.  Unfortunately, Brett Hanna has been off of his inhaled antibiotics due to high co-pay.  He feels that this is worsened his cough and breathing.  He is now having coughing attacks and notes more chest discomfort when coughing.  The coughing attacks started prior to discontinuation of inhaled antibiotics but have now worsened.  He is expectorating brown and somewhat bloody sputum although he is unsure if the blood in the sputum is coming from his sinuses since he has experienced more frequent nosebleeds recently.  He tolerated the Cipro very well aside from having some muscle stiffness in the morning.  Has been losing weight despite his appetite remaining robust.  He and his wife attribute this to his Jardiance.  04/02/20:  Sputum a little better but still there. Breathing a bit better. Over past 10 days, noticed a bit more.  Sputum is tan with rusty and green.  Not frank blood.  Yesterday, had coughing and wheezing all day.  Today is better.  Sputum comes up every time bends over.  Coughing spells not as bad or consistent as prior. When cough is looser, has raspy wheeze.  On Cayston right now. Awaiting Colistin. No F/C/NS. Just got new CPAP mask.  Walking for exercise - throughout the day.     06/28/20: Just started azitho. Cough improved but still there. Can cough up more sputum or have dry coughing. Can be short of breath at rest - gradually worsening since prior to his abx.  Has to pause when exerting himself. Coughs more if lays on left side but noticing on occasion if on right.  Change from inside to outside and starts coughing.  Sputum green to yellow. No hemoptysis. He continues to use his PAP device and finds it very beneficial. He sleeps much better when wearing it and feels that his breathing is also better as a result.      09/12/20:  Still waiting for new CPAP machine to be delivered.  [facility] contacted his wife 2 days ago and said that someone would call them to schedule an appointment for delivery but she has not heard anything yet.  Brett Hanna feels that the humidification in the CPAP is not working properly.  He is having to wake up at night to drink water because it is drying him out.  There is an alert on the machine notifying him that the motor life has expired.  It sounds louder than prior.  He is unsure if he is getting the same pressures as before and notes that he wakes up every 1-2 hours; these awakenings are in addition to waking up to drink water.    Lately, he feels like infection has built up in his lungs.  This is resulting in increased shortness of breath until he is able to expectorate it.  He feels that he does not have the same air capacity in his lungs as prior.  Mucus can be quite tenacious when he tries to expectorate it, noting that he will really have to rub to get it off of his shirt or the sink.  It is yellowish-tan in color without evidence of hemoptysis.  He feels that the chronic azithromycin has been the only new therapy that has provided a sustained improvement in his respiratory symptoms.  He feels that it attenuates them but does not fully resolve them.  He remains on his chronic Bactrim as well.  In August, he was treated with levofloxacin and had a little bit of improvement in his cough and breathing but symptoms returned about a week after completing therapy.  Due to insurance limitations, we have been unable to get him inhaled colistin. Has been using Cayston every other month.    12/13/20:  Was admitted to [facility] 11/14 to 11/17 to start IV antibiotics. During hospitalization, had new onset hypoxemia.  Was discharged on oxygen. Discharged on IV ceftazidime and IV tobramycin; completed course on 10/11/20. On 10/30/20, reported increased cough and some yellow sputum but better than prior on 10/30/20.    Better for 3-4 weeks after hospitalization.  He does not recall if the Pulmozyme was helpful during his hospitalization.    Today, reports coughing up tan sputum that increased volume about 7-10 days ago.  This is still better than before hospitalization. Restarted Cayston. Having coughing attacks again.  Two days ago, coughed and wheezed all day.  Today drier but now wheezing. If leans over, coughs up sputum. Productive cough with colistin. On colistin now.    Oxygen dropping to 70s when off oxygen. Bleeding oxygen into CPAP because otherwise, he wakes up with neck pain and his oxygen levels are in the 70s. Having issues with humidification with CPAP. Increased to 5 and had more sinus issues.  Cut back to 2 or 3 and better but still having dry mouth. Feels like CPAP giving more pressure and leaking out around face. No lower extremity edema. Today feels like breathing is better. He and wife feel that abdominal swelling has increased. He is not currently experiencing liver pain today.    Past Medical History:   Diagnosis Date   ??? Allergic rhinitis    ??? Alpha-1-antitrypsin deficiency carrier     with slightly low levels   ??? Amblyopia    ??? Asthma    ??? Asthma, extrinsic    ??? Bronchiectasis (CMS-HCC) 06/12/2011   ??? Chronic sinusitis    ??? Colon polyps    ??? COVID-19 10/24/2019   ??? Diabetes mellitus (CMS-HCC)    ??? GERD (gastroesophageal reflux disease)    ??? H/O Mycobacterium avium complex infection    ??? Impaired glucose tolerance    ??? Obesity    ??? Pneumonia    ??? Pseudomonas respiratory infection    ??? 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 Sleep apnea    ??? Strabismus    ??? Strep throat        Past Surgical History:   Procedure Laterality Date   ??? BRONCHOSCOPY     ??? LUNG BIOPSY     ??? PR BRONCHOSCOPY,DIAGNOSTIC W BRUSH Bilateral 08/21/2013    Procedure: BRONCHOSCOPY, RIGID OR FLEXIBLE, INCLUDING FLOURO GUIDED; DIAGNOSTIC, WITH BRUSHING OR PROTECTED BRUSHINGS;  Surgeon: Nash Dimmer, MD;  Location: BRONCH PROCEDURE LAB [facility];  Service: Pulmonary   ??? PR BRONCHOSCOPY,DIAGNOSTIC W LAVAGE Bilateral 08/21/2013    Procedure: BRONCHOSCOPY, RIGID OR FLEXIBLE, INCLUDE FLUOROSCOPIC GUIDANCE WHEN PERFORMED; W/BRONCHIAL ALVEOLAR LAVAGE;  Surgeon: Nash Dimmer, MD;  Location: BRONCH PROCEDURE LAB [facility];  Service: Pulmonary   ??? PR COLONOSCOPY FLX DX W/COLLJ SPEC WHEN PFRMD N/A 03/21/2015    Procedure: COLONOSCOPY, FLEXIBLE, PROXIMAL TO SPLENIC FLEXURE; DIAGNOSTIC, W/WO COLLECTION SPECIMEN BY BRUSH OR WASH;  Surgeon: Brendia Sacks, MD;  Location: [facility]URES [facility];  Service: Gastroenterology   ??? SHOULDER SURGERY Right        Family History   Problem Relation Age of Onset   ??? Colon cancer Father    ??? Emphysema Maternal Uncle    ??? Heart disease Maternal Uncle    ??? Diabetes Paternal Grandfather        Social History     Tobacco Use   ??? Smoking status: Never Smoker   ??? Smokeless tobacco: Former Estate agent Use Topics   ??? Alcohol use: No     Alcohol/week: 0.0 standard drinks   ??? Drug use: No       Allergies  Reviewed on 12/13/2020      Reactions Comment    Codeine Shortness Of Breath, Rash     Rifampin  Fevers, chills.    Bamlanivimab Other (See Comments) Low Back pain    Daliresp [roflumilast] Other (See Comments) Back pain          Current  Outpatient Medications   Medication Sig Dispense Refill   ??? acetaminophen (TYLENOL EXTRA STRENGTH) 500 MG tablet Take 1 tablet by mouth as needed for pain     ??? albuterol 2.5 mg /3 mL (0.083 %) nebulizer solution Inhale the contents of 1 vial (3 ml) via nebulizer twice daily and every 6 hours as needed for shortness of breath and wheezing 360 mL 5   ??? albuterol HFA 90 mcg/actuation inhaler Inhale 2 puffs every four (4) hours as needed for wheezing 18 g 11   ??? atorvastatin (LIPITOR) 20 MG tablet Take 1 tablet (20 mg total) by mouth daily. 30 tablet 11   ??? azithromycin (ZITHROMAX) 250 MG tablet Take 1 tablet (250 mg total) by mouth daily. 30 tablet 11   ??? aztreonam lysine (CAYSTON) 75 mg/mL Nebu nebulization solution Inhale 1 mL (75 mg total) by nebulization every eight (8) hours. Nebulize every other month after airway clearance using Altera nebulizer 84 mL 5   ??? blood-glucose meter kit Check sugars once daily. 1 each 0   ??? cetirizine (ZYRTEC) 10 MG tablet Take 1 tablet (10 mg total) by mouth daily. 90 tablet 3   ??? cholecalciferol, vitamin D3, (VITAMIN D3) 2,000 unit cap Take 1 capsule (2,000 Units total) by mouth daily. 30 capsule prn   ??? colistimethate (COLYMYCIN) 150 mg injection Mix 2mL of colistin with 3mL sterile water. Nebulize twice daily for 28 days every other month 112 mL 5   ??? empagliflozin (JARDIANCE) 25 mg tablet Take 1 tablet (25 mg total) by mouth daily. 90 tablet 3   ??? fexofenadine (ALLEGRA) 180 MG tablet Take 1 tablet (180 mg total) by mouth daily. (Patient taking differently: Take 180 mg by mouth daily. Alternates with zyrtec) 90 tablet 3   ??? fluticasone propion-salmeteroL (ADVAIR HFA) 230-21 mcg/actuation inhaler Inhale 2 puffs Two (2) times a day. (Patient taking differently: Inhale 2 puffs Two (2) times a day. Filling at [facility] 03/07/20) 36 g 3   ??? fluticasone propionate (FLONASE) 50 mcg/actuation nasal spray Use 2 sprays in each nostril route daily. 16 g 11   ??? lancets Misc 1 each by Other route once daily. Test daily before breakfast. Dispense 90 day supply. 100 each 3   ??? metFORMIN (GLUCOPHAGE-XR) 500 MG 24 hr tablet Take 4 tablets (2,000 mg total) by mouth daily with evening meal. 360 tablet 3   ??? MISCELLANEOUS MEDICAL SUPPLY MISC Frequency:PHARMDIR   Dosage:0.0     Instructions:  Note:CPAP 15 cm H20 with heated humidity for nasal dryness.  Mask: ResMed Quattro size Medium. Dx OSA. Dose: 1     ??? montelukast (SINGULAIR) 10 mg tablet Take 1 tablet (10 mg total) by mouth nightly. 90 tablet 3   ??? nebulizers Misc Use with inhaled medication. 3 each 3   ??? nebulizers Misc USE AS DIRECTED WITH COLISTIN 1 each 5   ??? needle, disp, 21 G 21 gauge x 1 1/2 Ndle Use to draw up inject normal saline into colistin vial and to withdraw colistin dose for inhalation 100 each 5   ??? omeprazole (PRILOSEC) 20 MG capsule Take 1 capsule (20 mg total) by mouth two (2) times a day. 180 capsule 3   ??? sod bicarb-sod chlor-neti pot (AYR SALINE NASAL NETI RINSE) pkdv Use twice per day     ??? sodium chloride (NS) 0.9 % injection Inject 2mL of 0.9%NaCl into colistin vial & gently mix. After withdrawing colistin dose, add an additional 1mL of 0.9%NaCl to neb cup  with the colistin dose. 180 mL 5   ??? sodium chloride 7% 7 % Nebu Inhale 4 mL by nebulization Two (2) times a day. 720 mL 3   ??? sulfamethoxazole-trimethoprim (BACTRIM) 400-80 mg per tablet Take 1 tablet (80 mg of trimethoprim total) by mouth Two (2) times a day. 180 tablet 3   ??? tiotropium bromide (SPIRIVA RESPIMAT) 2.5 mcg/actuation inhalation mist Inhale 2 puffs daily. 4 g 11     No current facility-administered medications for this visit.     Physical Exam:  BP 128/83  - Pulse 77  - Temp 37 ??C (98.6 ??F) (Temporal)  - Resp 16  - Ht 167.6 cm (5' 5.98)  - Wt 79.4 kg (175 lb)  - SpO2 95%  - BMI 28.26 kg/m??   Overweight white male appearing comfortable and in no acute distress. Easy work of breathing without accessory muscle use.  Crackles throughout mid and lower lung fields bilaterally.  Speaking in full sentences easily.  Regular rate and rhythm with normal S1 and S2.  No murmurs, rubs, or gallops.  Pitting lower extremity edema to just under the knee. Abdomen distended and firm but non-tender.    Diagnostic Review:   The following data were reviewed during this visit with key findings summarized below:    Pulmonary Function Testing: Spirometry obtained today demonstrates severe airway obstruction.  Reduction in FVC may reflect air trapping versus moderate restriction.    ?? FVC (% predicted) FEV1 (% predicted) FEV1/FVC   05/12/13 3.48 L (80%) 2.92 L (85%) 84%   06/26/14 2.84 L (65%) 2.29 L (67%) 81%   03/25/17 2.95 L (67%) 2.44 L (71%) 83%   06/29/17 2.68 L (62%) 2.34 L (70%) 87%   11/23/17 2.62 L (61%) 2.20 L (66%) 84%   03/08/18 2.59 L (60%) 2.06 L (62%) 80%   06/21/18 2.76 L (64%) 2.04 L (62%) 74%   11/01/18 2.63 L (61%) 2.08 L (63%) 79%   01/09/20 2.29 L (55%) 1.84 L (55%) 80%   04/02/20 2.27 L (54%) 1.91 L (58%) 84%   09/12/20 2.11 L (51%) 1.54 L (47%) 73%   12/13/20 2.17 L (52%) 1.59 L (48%) 73%     (12/13/20): Ambulated 368 m. Starting oxygen saturation of 95%. Desaturated to 88% on RA at minute 2. Supplemental oxygen at 3L flow added with improvement of saturation. Was able ot complete walk and maintain saturations above 88%.  (09/12/20): Ambulated 366 m.  Starting oxygen saturation of 97%.  Nadir oxygen saturation of 91% at minutes 4 and minutes 6.  Did not require supplemental oxygen during the walk test.    Cultures:  ?? Source Bacterial Culture AFB Smear AFB Culture   03/25/17 Sputum 3+ OPF; 3+ Stenotrophomonas negative negative   06/29/17 Sputum OPF negative Overgrown with bacteria/fungus   01/28/18 Sputum 2+ OPF; 2+ GNR glucose non-fermenter; Scedosporium negative 2 morphologies Streptomyces   02/01/18 Sputum 3+ OPF; 1+ GNR glucose non-fermenter; Scedosporium negative Streptomyces   03/02/18 Sputum 3+ OPF; 3+ GNR glucose non-fermenter; Scedosporium negative Streptomyces   08/07/19 Sputum 3+ OPF; 3+ B gladioli negative Negative   10/17/19 Sputum 2+ OPF; 4+ B gladioli negative Overgrown   01/09/20 Sputum 2+ OPF; 4+ B gladioli negative Overgrown   04/25/20 Sputum 4+ B gladioli negative Overgrown   06/28/20 Sputum 3+ B gladioli negative Overgrown   09/16/20 Sputum 3+ OPF; 3+ B gladioli - -   11/14/20 Sputum 3+ OPF; 2+ B gladioli; 2+ PsA; A terreus negative  Overgrown   ??Previously grew PsA 09/10/2011      Imaging  Chest CT (09/16/20): Images personally reviewed. Clear central tracheobronchial tree. Interval increase in bronchial wall thickening and bronchiectasis involving the lingula and bilateral lung bases when compared to 03/18/2018. Interval worsening of bilateral nodular/tree-in-bud opacities. For reference: Right upper lobe nodule measuring 0.6 cm (2:28), right middle lobe nodule measuring 1.0 cm (2:53). No pleural effusion.    Chest CT (03/18/18): Images personally reviewed. Bronchial wall thickening and bronchiectasis primarily involving the airways supplying the lingula and lung bases with associated clustered micronodular and tree-in-bud opacities. With respect to 02/02/2018, there is increased opacification at the deep posterior costophrenic recesses with more volume loss particularly at the left base, favored to represent atelectasis.  ??  Chest CT (02/02/18): Images personally reviewed. Bronchiectasis predominantly in bases of lingula, right middle lobe, and bilateral lower lobes (left greater than right), mild to moderately progressed since 03/30/2017. ?? New and increased nodular and tree-in-bud opacities, now involving all lobes. Patchy consolidations in the bases of the lingula, and lower lobes also stable to mildly increased. Also new left upper lobe nodular opacity with adjacent groundglass, 8 mm, (2:26). No definite reticular opacities, or honeycombing identified. No appreciable air trapping on expiratory imaging.  ??  Chest CT (03/30/17): Images personally reviewed. Interval worsening of bilateral lower lobe bronchiectasis and bronchial wall thickening with new lingular bronchiectasis. Increased clustered groundglass opacities with areas of consolidation in the lower lobes and lingula. Findings may represent sequela of endobronchial infection or impaired mucus clearance.  Bronchiectasis has been present on CT imaging since 06/12/11.    Cardiac:   EKG (06/28/20): HR 66 with QTc . Normal sinus rhythm.    Portions of this record have been created using Scientist, clinical (histocompatibility and immunogenetics). Dictation errors have been sought, but may not have been identified and corrected.    Viona Gilmore, MD  12/13/20  9:39 AM

## 2020-12-16 LAB — COMPREHENSIVE ENVIRONMENTAL PANEL
ALTERNARIA ALTERNATA IGE: <0.35 kU/L (ref <0.35)
ASPERGILLUS FUMIGATUS IGE: <0.35 kU/L (ref <0.35)
ASPERGILLUS NIGER IGE: <0.35 kU/L (ref <0.35)
BAHIA GRASS IGE: <0.35 kU/L (ref <0.35)
BEECH (AMERICAN) TREE IGE: <0.35 kU/L (ref <0.35)
BERMUDA GRASS IGE: <0.35 kU/L (ref <0.35)
BIRCH (COMMON SILVER) TREE IGE: <0.35 kU/L (ref <0.35)
BOX ELDER TREE IGE: <0.35 kU/L (ref <0.35)
CANDIDA ALBICANS IGE: <0.35 kU/L (ref <0.35)
CAT DANDER IGE: <0.35 kU/L (ref <0.35)
CLADOSPORIUM HERBARUM IGE: <0.35 kU/L (ref <0.35)
COCKLEBUR IGE: <0.35 kU/L (ref <0.35)
COTTONWOOD (WHITE POPLAR) TREE IGE: <0.35 kU/L (ref <0.35)
D. FARINAE IGE: <0.35 kU/L (ref <0.35)
D. PTERONYSSINUS IGE: <0.35 kU/L (ref <0.35)
DOG DANDER IGE: <0.35 kU/L (ref <0.35)
ELM TREE IGE: <0.35 kU/L (ref <0.35)
ENGLISH PLANTAIN IGE: <0.35 kU/L (ref <0.35)
EPICOCCUM PURPURASCENS IGE: <0.35 kU/L (ref <0.35)
FUSARIUM PROLIFERATUM IGE: <0.35 kU/L (ref <0.35)
GERMAN COCKROACH IGE: <0.35 kU/L (ref <0.35)
GIANT RAGWEED IGE: <0.35 kU/L (ref <0.35)
GOOSEFOOT (LAMB'S QUARTERS) IGE: <0.35 kU/L (ref <0.35)
JOHNSON GRASS IGE: <0.35 kU/L (ref <0.35)
MAPLE LEAF SYCAMORE IGE: <0.35 kU/L (ref <0.35)
MOUSE IGE: <0.35 kU/L (ref <0.35)
MUCOR RACEMOSUS IGE: <0.35 kU/L (ref <0.35)
MUGWORT IGE: <0.35 kU/L (ref <0.35)
P CHRYSOGENUM (P NOTATUM) IGE: <0.35 kU/L (ref <0.35)
PECAN (HICKORY) IGE: <0.35 kU/L (ref <0.35)
PIGWEED, COMMON IGE: <0.35 kU/L (ref <0.35)
RAGWEED, SHORT (COMMON) IGE: <0.35 kU/L (ref <0.35)
RHIZOPUS NIGRICAN IGE: <0.35 kU/L (ref <0.35)
SETOMELANOMMA ROSTRATA (H. HALODES) IGE: <0.35 kU/L (ref <0.35)
SHEEP SORREL IGE: <0.35 kU/L (ref <0.35)
TIMOTHY GRASS IGE: <0.35 kU/L (ref <0.35)
TRICHOPHYTON RUBRUM IGE: <0.35 kU/L (ref <0.35)
WALNUT TREE IGE: <0.35 kU/L (ref <0.35)
WHITE ASH TREE IGE: <0.35 kU/L (ref <0.35)
WHITE OAK TREE IGE: <0.35 kU/L (ref <0.35)
WILLOW TREE IGE: <0.35 kU/L (ref <0.35)

## 2020-12-16 LAB — IGE: TOTAL IGE: 22 [IU]/mL

## 2020-12-16 NOTE — Unmapped (Signed)
Faxed prescription to [facility] for ONO.  Patient notified via MyChart.

## 2020-12-16 NOTE — Unmapped (Signed)
Spoke to Cardiology Scheduling [phone])  to let them know of updated study that had been ordered.     Stated he can keep same appointment and they will link the new order to his current appointment on 01/14/2021.

## 2020-12-16 NOTE — Unmapped (Signed)
Spoke to [name] to let him know that Dr. Garner Nash would also like him to have a liver ultrasound as follow-up for his Alpha-1.     Let him know that when they call to schedule to let them know that he has other visits on 01/14/21.    He is aware that if he does not get a call to please let me know.     He verbalized understanding.

## 2020-12-25 DIAGNOSIS — J479 Bronchiectasis, uncomplicated: Principal | ICD-10-CM

## 2020-12-25 DIAGNOSIS — E1169 Type 2 diabetes mellitus with other specified complication: Principal | ICD-10-CM

## 2021-01-07 DIAGNOSIS — J479 Bronchiectasis, uncomplicated: Principal | ICD-10-CM

## 2021-01-07 MED ORDER — AZITHROMYCIN 250 MG TABLET
ORAL_TABLET | Freq: Every day | ORAL | 11 refills | 30.00000 days | Status: CP
Start: 2021-01-07 — End: 2022-01-07
  Filled 2021-01-13: qty 30, 30d supply, fill #0

## 2021-01-08 MED ORDER — ALTERA NEBULIZER SYSTEM
0 refills | 0 days | Status: CP
Start: 2021-01-08 — End: ?

## 2021-01-08 NOTE — Unmapped (Signed)
Ordering Altera nebulizer system to [facility].

## 2021-01-09 MED FILL — ATORVASTATIN 20 MG TABLET: ORAL | 30 days supply | Qty: 30 | Fill #9

## 2021-01-09 MED FILL — OMEPRAZOLE 20 MG CAPSULE,DELAYED RELEASE: ORAL | 90 days supply | Qty: 180 | Fill #2

## 2021-01-09 MED FILL — MONTELUKAST 10 MG TABLET: ORAL | 90 days supply | Qty: 90 | Fill #3

## 2021-01-14 ENCOUNTER — Ambulatory Visit: Admit: 2021-01-14 | Discharge: 2021-01-14 | Payer: PRIVATE HEALTH INSURANCE

## 2021-01-14 ENCOUNTER — Encounter: Admit: 2021-01-14 | Discharge: 2021-01-14 | Payer: PRIVATE HEALTH INSURANCE

## 2021-01-14 DIAGNOSIS — E119 Type 2 diabetes mellitus without complications: Principal | ICD-10-CM

## 2021-01-14 DIAGNOSIS — J471 Bronchiectasis with (acute) exacerbation: Principal | ICD-10-CM

## 2021-01-14 DIAGNOSIS — E785 Hyperlipidemia, unspecified: Principal | ICD-10-CM

## 2021-01-14 DIAGNOSIS — Z148 Genetic carrier of other disease: Principal | ICD-10-CM

## 2021-01-14 DIAGNOSIS — G4733 Obstructive sleep apnea (adult) (pediatric): Principal | ICD-10-CM

## 2021-01-14 NOTE — Unmapped (Signed)
Spoke to Ms. Haberer.     Pharmacy had not yet shipped the Cayston. I let her know that the prescription for the [facility] system).    She will let me know if there is an issue when she speaks with them tomorrow.

## 2021-01-14 NOTE — Unmapped (Signed)
[facility] Internal Medicine Faculty Practice-Return visit      Assessment and Plan    1. Alpha-1-antitrypsin deficiency carrier    2. Type 2 diabetes mellitus without complication, without long-term current use of insulin (CMS-HCC)    3. Hyperlipidemia, unspecified hyperlipidemia type    4. Obstructive sleep apnea syndrome    5. Bronchiectasis with acute exacerbation (CMS-HCC)               #1. DM2. A1C 6.6 today. At goal. UTD on DM health maintenance.    #2. Bronchiectasis. Following closely with pulmonary. Since the IV abx, he does feel like his symptoms have increased. He gave a sputum sample at pulm today.     #3. OSA. He is using his CPAP nightly and has the oxygen linked into it.    #4. HM. He declined COVID booster today. I recommended it for him and discussed impact on COVID on lungs.    #5. Colon polyps. He is due for colonoscopy (h/o polyps and family history of CRC in 1st degree relative). Now that he's on oxygen, increased procedural risk. Will touch base with his pulmonologist about risk/benefit.    No orders of the defined types were placed in this encounter.        Follow up: Return in about 3 months (around 04/16/2021), or if symptoms worsen or fail to improve.        Reason for visit: Follow-up  , DM    Brett Simpler Sr. is a 55 y.o. male who presents for follow up.    HPI  Breathing is about the same.  He hasn't noticed swelling.  Sats staying around 93-94% on 2L  Some issue with the diaphragm on the tank. Figured out how to manage it.     Is bringing up more phelgm. Back to like how it was before the abx. Not hemoptysis.     Pollen does bother him. Sinuses starting to act up. Taking allegra. Is using the flonase too 2 sprays twice a day.  Using saline nasal spray every other day.     Using CPAP nightly.     Had side effects with the COVID vaccines.  Hard to wear a mask with exertion  Ok with staying still    Hasn't had stress feeling in some time. Not since getting on the oxygen.    His father had colon cancer. His last colo was years ago.    Medications were reviewed    Physical Exam  Gen: well appearing, NAD  Vitals:    01/14/21 0931   BP: 125/75   BP Site: L Arm   BP Position: Sitting   Pulse: 79   SpO2: 95%   Weight: 82.6 kg (182 lb)   On 2L [place]  OP: clear  CV: RRR, no mrg  Pulm: diffuse wheezing  VHQ:IONG edema bilat    A1C 6.6 today    Reviewed pulm notes  Reviewed BNP level  Reviewed environmental panel      Medication adherence and barriers to the treatment plan have been addressed. Opportunities to optimize healthy behaviors have been discussed. Patient / caregiver voiced understanding.

## 2021-01-14 NOTE — Unmapped (Addendum)
I'm glad you're using your allergy medicine  I do recommend COVID booster  You might try a face mask bracket to help tolerate the mask better  Your blood sugars are good  I'll talk to Dr. Reuel Boom about the colonoscopy.

## 2021-01-14 NOTE — Unmapped (Signed)
[facility] at [facility]       Type of visit:  face to face    Reason for visit: Follow up, decline booster and Tdap    Questions / Concerns that need to be addressed: none    General Consent to Treat (GCT) for non-epic video visits only: Verbal consent      Diabetes:  ??? Regularly checking blood sugars?: yes      Hypertension:  ??? Have blood pressure cuff at home?: no        Screening BP-   125/75  79        Allergies reviewed: Yes    Medication reviewed: Yes  Pended refills? No        HCDM reviewed and updated in Epic:    We are working to make sure all of our patients??? wishes are updated in Epic and part of that is documenting a Environmental health practitioner for each patient  A Health Care Decision Rodena Piety is someone you choose who can make health care decisions for you if you are not able ??? who would you most want to do this for you????  is already up to date.            COVID-19 Vaccine Summary  Which COVID-19 Vaccine was administered  Moderna  Type:  Complete  Dates Given:  05/23/2020     Date last COVID Positive Test:   10/23/2019      Date last mAB infusion:  10/25/2019        Immunization History   Administered Date(s) Administered   ??? COVID-19 VACCINE,MRNA(MODERNA)(PF)(IM) 04/25/2020, 05/23/2020   ??? Hep A / Hep B 08/26/2010   ??? Hepatitis B, Adult 09/16/2010   ??? INFLUENZA INJ MDCK PF, QUAD,(FLUCELVAX)(55MO AND UP EGG FREE) 09/30/2017   ??? INFLUENZA TIV (TRI) PF (IM) 09/22/2010, 11/17/2011   ??? Influenza Vaccine Quad (IIV4 PF) 30mo+ injectable 08/15/2012, 07/20/2014, 08/14/2015, 09/22/2016, 09/06/2018, 09/08/2019, 08/20/2020   ??? Influenza Vaccine Quad (IIV4 W/PRESERV) 55MO+ 09/26/2013   ??? Influenza Virus Vaccine, unspecified formulation 08/16/2015, 10/08/2017   ??? PNEUMOCOCCAL POLYSACCHARIDE 23 07/10/2010, 11/23/2017   ??? Pneumococcal Conjugate 13-Valent 11/24/2013   ??? TdaP 12/22/2008       __________________________________________________________________________________________

## 2021-01-14 NOTE — Unmapped (Signed)
A1C at goal. Pt notified.

## 2021-01-16 DIAGNOSIS — A498 Other bacterial infections of unspecified site: Principal | ICD-10-CM

## 2021-01-16 DIAGNOSIS — J479 Bronchiectasis, uncomplicated: Principal | ICD-10-CM

## 2021-01-16 MED ORDER — CAYSTON 75 MG/ML SOLUTION FOR NEBULIZATION
Freq: Three times a day (TID) | RESPIRATORY_TRACT | 5 refills | 30.00000 days | Status: CP
Start: 2021-01-16 — End: 2022-01-16

## 2021-01-16 NOTE — Unmapped (Signed)
Pharmacy is in need of refill of [place] at this time.     Routing for review.    Will phone [facility] after I verify that the pharmacy has received.     [facility] system has already been sent.     Thank you

## 2021-01-17 DIAGNOSIS — J309 Allergic rhinitis, unspecified: Principal | ICD-10-CM

## 2021-01-17 DIAGNOSIS — J470 Bronchiectasis with acute lower respiratory infection: Principal | ICD-10-CM

## 2021-01-17 DIAGNOSIS — A498 Other bacterial infections of unspecified site: Principal | ICD-10-CM

## 2021-01-17 MED ORDER — SULFAMETHOXAZOLE 400 MG-TRIMETHOPRIM 80 MG TABLET
ORAL_TABLET | Freq: Two times a day (BID) | ORAL | 3 refills | 90.00000 days | Status: CP
Start: 2021-01-17 — End: ?
  Filled 2021-01-21: qty 180, 90d supply, fill #0

## 2021-01-17 MED ORDER — FLUTICASONE PROPIONATE 50 MCG/ACTUATION NASAL SPRAY,SUSPENSION
Freq: Every day | NASAL | 11 refills | 30 days | Status: CP
Start: 2021-01-17 — End: ?
  Filled 2021-01-21: qty 16, 30d supply, fill #0

## 2021-01-17 NOTE — Unmapped (Signed)
[facility] Refill Coordination Note    Specialty Medication(s) to be Shipped:   CF/Pulmonary: -Nebulized colistin (150mg  vials) and supply kit  Other medication(s) to be shipped: Albuterol 2.5mg /102ml neb solu, BD Posiflush 0.9%, BD Bevel Needles, Fluticasone nasal spray , LC Plus neb cups, Metformin 500mg  & Sulfa- Trime 400-80mg       [name], DOB: Nov 29, 1965  Phone: [phone] (home)     All above HIPAA information was verified with patient.     Was a Nurse, learning disability used for this call? No    Completed refill call assessment today to schedule patient's medication shipment from the [facility] [phone]).       Specialty medication(s) and dose(s) confirmed: Regimen is correct and unchanged.   Changes to medications: [name] reports no changes at this time.  Changes to insurance: No  Questions for the pharmacist: No    Confirmed patient received Welcome Packet with first shipment. The patient will receive a drug information handout for each medication shipped and additional FDA Medication Guides as required.       DISEASE/MEDICATION-SPECIFIC INFORMATION        For CF patients: CF Healthwell [employer] Active? No-not enrolled    SPECIALTY MEDICATION ADHERENCE     Medication Adherence    Patient reported X missed doses in the last month: 0  Specialty Medication: Colistimethate 150mg   Patient is on additional specialty medications: No  Patient is on more than two specialty medications: No  Informant: patient  Reliability of informant: reliable  Reasons for non-adherence: no problems identified  Support network for adherence: family member        Colistimethate 150 mg: 0 days of medicine on hand (Start 01/27/2021)    SHIPPING     Shipping address confirmed in Epic.     Delivery Scheduled: Yes, Expected medication delivery date: 01/22/2021.     Medication will be delivered via UPS to the prescription address in Epic WAM.    Mosi Hannold P Wetzel [place] [facility] Specialty Technician

## 2021-01-21 MED FILL — BD POSIFLUSH NORMAL SALINE 0.9 % INJECTION SYRINGE: 30 days supply | Qty: 180 | Fill #1

## 2021-01-21 MED FILL — COLISTIN (COLISTIMETHATE SODIUM) 150 MG SOLUTION FOR INJECTION: 56 days supply | Qty: 112 | Fill #1

## 2021-01-21 MED FILL — LC PLUS MISC: 56 days supply | Qty: 1 | Fill #1

## 2021-01-21 MED FILL — BD REGULAR BEVEL NEEDLES 21 GAUGE X 1 1/2": 30 days supply | Qty: 60 | Fill #1

## 2021-01-21 MED FILL — ALBUTEROL SULFATE 2.5 MG/3 ML (0.083 %) SOLUTION FOR NEBULIZATION: 30 days supply | Qty: 360 | Fill #1

## 2021-01-21 MED FILL — METFORMIN ER 500 MG TABLET,EXTENDED RELEASE 24 HR: ORAL | 90 days supply | Qty: 360 | Fill #1

## 2021-01-23 NOTE — Unmapped (Signed)
Phoned [facility] directly today regarding his Cayston prescription.     They stated that they are just in need of his updated insurance information

## 2021-01-23 NOTE — Unmapped (Signed)
Phoned back to [name]. Let him know that [facility] was just in need of updated insurance information to have on file.     Provided them with the contact number for Pharmacy and asked them to please call.

## 2021-01-24 NOTE — Unmapped (Signed)
I saw and evaluated the patient, participating in the key portions of the service.  I reviewed the resident???s note.  I agree with the resident???s findings and plan. Danielle Dess, MD

## 2021-02-03 ENCOUNTER — Ambulatory Visit: Admit: 2021-02-03 | Discharge: 2021-02-04 | Payer: PRIVATE HEALTH INSURANCE

## 2021-02-03 MED FILL — VENTOLIN HFA 90 MCG/ACTUATION AEROSOL INHALER: RESPIRATORY_TRACT | 17 days supply | Qty: 18 | Fill #0

## 2021-02-04 DIAGNOSIS — J479 Bronchiectasis, uncomplicated: Principal | ICD-10-CM

## 2021-02-04 MED ORDER — ALBUTEROL SULFATE HFA 90 MCG/ACTUATION AEROSOL INHALER
RESPIRATORY_TRACT | 11 refills | 17.00000 days | Status: CP | PRN
Start: 2021-02-04 — End: 2022-02-04
  Filled 2021-04-16: qty 360, 30d supply, fill #2

## 2021-02-04 NOTE — Unmapped (Signed)
Received message from pharmacy that new albuterol HFA script needed because needing to change brands.  New script for Albuterol HFA written and sent to [facility].

## 2021-02-07 DIAGNOSIS — J4550 Severe persistent asthma, uncomplicated: Principal | ICD-10-CM

## 2021-02-07 DIAGNOSIS — J479 Bronchiectasis, uncomplicated: Principal | ICD-10-CM

## 2021-02-07 MED ORDER — ADVAIR HFA 230 MCG-21 MCG/ACTUATION AEROSOL INHALER
Freq: Two times a day (BID) | RESPIRATORY_TRACT | 3 refills | 90 days | Status: CP
Start: 2021-02-07 — End: ?
  Filled 2021-02-18: qty 12, 30d supply, fill #0

## 2021-02-07 NOTE — Unmapped (Signed)
[facility] Refill Coordination Note    Specialty Medication(s) to be Shipped:   CF/Pulmonary: -None: Colistimethate is not needed at this time  Other medication(s) to be shipped: Advair 230-69mcg HFA, Atorvastatin 20mg  & Azithromycin 250mg    **Pt currently on Colistimethate & then next month will switch to [place].**     Brett Fogo Toole Sr., DOB: 30-Apr-1966  Phone: [phone] (home)     All above HIPAA information was verified with patient's family member, Mother, Lupita Leash.     Was a Nurse, learning disability used for this call? No    Completed refill call assessment today to schedule patient's medication shipment from the [facility] [phone]).       Specialty medication(s) and dose(s) confirmed: Regimen is correct and unchanged.   Changes to medications: [name] reports no changes at this time.  Changes to insurance: No  Questions for the pharmacist: No    Confirmed patient received a Conservation officer, historic buildings and a Surveyor, mining with first shipment. The patient will receive a drug information handout for each medication shipped and additional FDA Medication Guides as required.       DISEASE/MEDICATION-SPECIFIC INFORMATION        For CF patients: CF Healthwell [employer] Active? No-not enrolled    SPECIALTY MEDICATION ADHERENCE     Medication Adherence    Patient reported X missed doses in the last month: 0  Specialty Medication: Colistimethate 150mg   Patient is on additional specialty medications: No  Patient is on more than two specialty medications: No  Informant: spouse  Reliability of informant: reliable  Reasons for non-adherence: no problems identified  Support network for adherence: family member        SHIPPING     Shipping address confirmed in Epic.     Delivery Scheduled: Yes, Expected medication delivery date: 02/18/2021.     Medication will be delivered via UPS to the prescription address in Epic WAM.    Marx Doig P Wetzel [place] [facility] Specialty Technician

## 2021-02-18 MED FILL — ATORVASTATIN 20 MG TABLET: ORAL | 30 days supply | Qty: 30 | Fill #10

## 2021-02-18 MED FILL — AZITHROMYCIN 250 MG TABLET: ORAL | 30 days supply | Qty: 30 | Fill #1

## 2021-03-06 MED FILL — FLUTICASONE PROPIONATE 50 MCG/ACTUATION NASAL SPRAY,SUSPENSION: NASAL | 30 days supply | Qty: 16 | Fill #1

## 2021-03-06 MED FILL — CETIRIZINE 10 MG TABLET: ORAL | 90 days supply | Qty: 90 | Fill #1

## 2021-03-13 NOTE — Unmapped (Addendum)
Spoke to [facility] today. For several weeks he has been having increasing issues with sinus drainage and congestion.     The post nasal drip is very bothersome and he is becoming hoarse.     Left ear also feels full.    Reporting some drainage from eyes as well. Does not feel like it has made its way down to his chest yet. Denies fever at this time.     He does take his singulair as directed and continues on all his pulmonary therapies at this time.     [name] has also has sinus issues and has been seen a their local urgent care.     At his time encouraged him to go continue with his therapies and medications as he has been. Did not feel like his own symptoms were severe enough yet to go to urgent care.     Discussed that I would relay this message to Dr. Garner Nash to further advise.

## 2021-03-14 MED FILL — ADVAIR HFA 230 MCG-21 MCG/ACTUATION AEROSOL INHALER: RESPIRATORY_TRACT | 30 days supply | Qty: 12 | Fill #1

## 2021-03-14 MED FILL — AZITHROMYCIN 250 MG TABLET: ORAL | 30 days supply | Qty: 30 | Fill #2

## 2021-03-18 DIAGNOSIS — J4550 Severe persistent asthma, uncomplicated: Principal | ICD-10-CM

## 2021-03-18 DIAGNOSIS — J479 Bronchiectasis, uncomplicated: Principal | ICD-10-CM

## 2021-03-18 MED ORDER — MONTELUKAST 10 MG TABLET
ORAL_TABLET | Freq: Every evening | ORAL | 3 refills | 90.00000 days | Status: CP
Start: 2021-03-18 — End: ?
  Filled 2021-05-14: qty 90, 90d supply, fill #0

## 2021-03-18 MED ORDER — SODIUM CHLORIDE 7 % FOR NEBULIZATION
Freq: Two times a day (BID) | RESPIRATORY_TRACT | 3 refills | 90 days | Status: CP
Start: 2021-03-18 — End: 2022-03-18
  Filled 2021-04-16: qty 720, 90d supply, fill #0

## 2021-03-18 MED ORDER — ATORVASTATIN 20 MG TABLET
ORAL_TABLET | Freq: Every day | ORAL | 3 refills | 90.00000 days | Status: CP
Start: 2021-03-18 — End: 2022-03-18
  Filled 2021-04-08: qty 90, 90d supply, fill #0

## 2021-03-19 NOTE — Unmapped (Signed)
Spoke to [name] and advised that he try and make contact with Dr. Ralene Hanna regarding his sinus symptoms.     At this time he is reporting minimal chest congestion, but said if his conditions worsens he will go to urgent care.    He said he was several hours away from Dr. Tarri Hanna office.     Asked him to please check back in and keep [place] posted.     He was in agreement with this plan.

## 2021-03-25 NOTE — Unmapped (Signed)
The [facility] has made a second and final attempt to reach this patient to refill the following medication:  Colistimethate 150mg .      We have left voicemails on the following phone numbers: [phone] (H) and have left voicemail with Wife, Lupita Leash & Patient himself at the following phone numbers: [phone] (OTHER PHONE).    Dates contacted: 03/18/2021 & 03/25/2021    Last scheduled delivery: 01/21/2021    The patient may be at risk of non-compliance with this medication. The patient should call the [facility] at [phone] (option 4) to refill medication.    Marikay Roads Leodis Binet   [facility] Specialty Technician

## 2021-03-30 DIAGNOSIS — J479 Bronchiectasis, uncomplicated: Principal | ICD-10-CM

## 2021-03-30 DIAGNOSIS — J471 Bronchiectasis with (acute) exacerbation: Principal | ICD-10-CM

## 2021-03-30 NOTE — Unmapped (Signed)
[facility]    Assessment:      Patient:Brett Hanna. (1966/04/18)    Brett Hanna is a 55 y.o. male who is seen for follow-up of bronchiectasis secondary to alpha-1 antitrypsin carrier state with prior NTM infection status post treatment, OSA, and Burkholderia gladioli colonization.  He is now growing PsA again; this is not surprising since he grew it in 2012. I am concerned about his chronic hypoxemic respiratory failure and lower extremity edema. I wonder if he has an element of heart failure. Additionally, with his fatty liver disease and A1AT MZ carrier state, he is more at risk for developing cirrhosis.  This could lead to PHTN and shunting.     Plan:      Problem List Items Addressed This Visit        Respiratory    Bronchiectasis without complication (CMS-HCC) - Primary    Relevant Orders    Ambulatory referral to Pulm Rehab    Chronic sinusitis       Other    Alpha-1-antitrypsin deficiency carrier    Pseudomonas aeruginosa colonization      Other Visit Diagnoses     Long-term use of high-risk medication        Relevant Orders    Methemoglobin (Completed)      Therapies:  ?? Patient met with the respiratory therapist today to discuss home oxygen equipment and overnight oximetry study.  ?? No changes were made to airway clearance regimen.   ?? No changes were made to oral or inhaled medications.  May stop Bactrim if has methemoglobinemia.  ?? Reminded him to call [facility] to get his Colistin refilled.  They have been unable to reach Brett Hanna or his wife.    Testing:  ??? Patient was unable to expectorate sputum today to be sent for bacterial and AFB cultures.  ??? If he has future exacerbations, sputum should be sent for bacterial and AFB cultures. The bacterial culture should be processed like a cystic fibrosis sample given the overlapping pathogens. There are standing orders for these cultures in our system.  ??? Checking methemoglobin level to ensure isn't a cause of hypoxemia. Bactrim has been rarely implicated in this.  ??? Pending results of overnight oximetry, further work-up for chronic hypoxemic respiratory failure will occur.    Referrals:  ??? Referral to telehealth pulmonary rehabilitation program was made.   ??? Reached out to Brett Hanna to facilitate a follow up appointment while Brett Hanna is in the area.    Vaccinations:  ??? COVID-19: Counseled on importance of booster.  He declined and stated he didn't feel comfortable with it.  ??? Flu: UTD  ??? Pneumococcal: UTD  ??? Tdap: UTD    Brett Hanna will return to clinic in 4 months for follow-up with sputum cultures.  He will call or send me a message via MyChart if questions or concerns arise before this visit. Brett Hanna is in agreement with the above plan.     Subjective:      HPI: Brett Hanna is a 55 y.o. male who is seen for follow-up of bronchiectasis secondary to alpha-1 antitrypsin carrier state with prior NTM infection status post treatment, OSA, and Burkholderia gladioli colonization.    09/05/19:  Having a terrible time with wheezing. More frequent coughing spells with more sputum volume. Better for few hours and then returns. If leans over to tie shoes, starts coughing and chokes on volume of mucus. Then can cough up blood during those  spells. Describes hemoptysis as blood mixed with sputum (more than streaks but not frank blood). Can get up in morning and be okay some days. Other days gets coughing spells and then gets headache and can't do anything. Started Cipro yesterday; has taken two doses so far and thinks already helping this morning. Didn't feel like response to Augmentin was sustained.  Supposed to be starting Cayston today but is unsure if he has new supply. Has another day worth of Colistin.  Tightness over RUQ comes and goes. If bends towards right, gets spasm.     01/09/20:  At his last visit, I treated him with Cipro for 2 weeks for newly cultured Burkholderia gladioli.  He feels like it helps somewhat but did not completely resolve his symptoms.  Subsequent culture in December was positive for B gladioli.  He had COVID-19 diagnosed 10/23/19 and received a monoclonal antibody therapy.  He tells me today that he does not think that the therapy was very helpful to him; his wife disagrees.  Unfortunately, Brett Hanna has been off of his inhaled antibiotics due to high co-pay.  He feels that this is worsened his cough and breathing.  He is now having coughing attacks and notes more chest discomfort when coughing.  The coughing attacks started prior to discontinuation of inhaled antibiotics but have now worsened.  He is expectorating brown and somewhat bloody sputum although he is unsure if the blood in the sputum is coming from his sinuses since he has experienced more frequent nosebleeds recently.  He tolerated the Cipro very well aside from having some muscle stiffness in the morning.  Has been losing weight despite his appetite remaining robust.  He and his wife attribute this to his Jardiance.    04/02/20:  Sputum a little better but still there. Breathing a bit better. Over past 10 days, noticed a bit more.  Sputum is tan with rusty and green.  Not frank blood.  Yesterday, had coughing and wheezing all day.  Today is better.  Sputum comes up every time bends over.  Coughing spells not as bad or consistent as prior. When cough is looser, has raspy wheeze.  On Cayston right now. Awaiting Colistin. No F/C/NS. Just got new CPAP mask.  Walking for exercise - throughout the day.     06/28/20: Just started azitho. Cough improved but still there. Can cough up more sputum or have dry coughing. Can be short of breath at rest - gradually worsening since prior to his abx.  Has to pause when exerting himself. Coughs more if lays on left side but noticing on occasion if on right.  Change from inside to outside and starts coughing.  Sputum green to yellow. No hemoptysis. He continues to use his PAP device and finds it very beneficial. He sleeps much better when wearing it and feels that his breathing is also better as a result.      09/12/20:  Still waiting for new CPAP machine to be delivered.  [facility] contacted his wife 2 days ago and said that someone would call them to schedule an appointment for delivery but she has not heard anything yet.  Brett Hanna feels that the humidification in the CPAP is not working properly.  He is having to wake up at night to drink water because it is drying him out.  There is an alert on the machine notifying him that the motor life has expired.  It sounds louder than prior.  He is unsure if he  is getting the same pressures as before and notes that he wakes up every 1-2 hours; these awakenings are in addition to waking up to drink water.    Lately, he feels like infection has built up in his lungs.  This is resulting in increased shortness of breath until he is able to expectorate it.  He feels that he does not have the same air capacity in his lungs as prior.  Mucus can be quite tenacious when he tries to expectorate it, noting that he will really have to rub to get it off of his shirt or the sink.  It is yellowish-tan in color without evidence of hemoptysis.  He feels that the chronic azithromycin has been the only new therapy that has provided a sustained improvement in his respiratory symptoms.  He feels that it attenuates them but does not fully resolve them.  He remains on his chronic Bactrim as well.  In August, he was treated with levofloxacin and had a little bit of improvement in his cough and breathing but symptoms returned about a week after completing therapy.  Due to insurance limitations, we have been unable to get him inhaled colistin. Has been using Cayston every other month.    12/13/20:  Was admitted to [facility] 11/14 to 11/17 to start IV antibiotics. During hospitalization, had new onset hypoxemia.  Was discharged on oxygen. Discharged on IV ceftazidime and IV tobramycin; completed course on 10/11/20. On 10/30/20, reported increased cough and some yellow sputum but better than prior on 10/30/20.    Better for 3-4 weeks after hospitalization.  He does not recall if the Pulmozyme was helpful during his hospitalization.    Today, reports coughing up tan sputum that increased volume about 7-10 days ago. This is still better than before hospitalization. Restarted Cayston. Having coughing attacks again.  Two days ago, coughed and wheezed all day.  Today drier but now wheezing. If leans over, coughs up sputum. Productive cough with colistin. On colistin now.    Oxygen dropping to 70s when off oxygen. Bleeding oxygen into CPAP because otherwise, he wakes up with neck pain and his oxygen levels are in the 70s. Having issues with humidification with CPAP. Increased to 5 and had more sinus issues.  Cut back to 2 or 3 and better but still having dry mouth. Feels like CPAP giving more pressure and leaking out around face. No lower extremity edema. Today feels like breathing is better. He and wife feel that abdominal swelling has increased. He is not currently experiencing liver pain today.    04/01/21:  Following last visit, had liver US with elastography (WNL) and echo with bubble (WNL).  Ordered overnight oximetry but haven't received report.   Nose gets stopped up and stays like that for a while.  Then blows and takes a lot of blowing but then nose feels dry and can't get anything out.   Doing sinus rinses every other day.  Eyes can run sometimes. Using flonase 2 sprays twice a day.    Respiratory Symptoms:   ??? Cough: productive of yellow sputum throughout day.  Can have spells of handfuls of stuff and can be green and taste foul.    ??? Nocturnal awakenings: absent.  Can have coughing spellls in night but not as frequent.  ??? Wheezing: present some but not as bad as before he went in hospital. Some days wheezes more.  ??? Chest tightness: present some but again improved from prior.  ??? Rescue albuterol use: albuterol  HFA twice a day for wheezing and coughing.  ??? Pleurisy: absent.  ??? Hemoptysis: absent.  ??? MMRC: 1 = I get short of breath when hurrying on level ground or walking up a slight hill. Has to stand for 30 seconds after going up hill.  Not as bad as it was. Can be short of breath in shower - has difficulty with oxygen.     Exacerbations:   ??? Number of exacerbations treated in the past year: 4  ??? Dates of exacerbations: 05/2020; 06/2020; 09/2020; 12/2020  ??? Number of hospitalizations for exacerbations in the past 2 years: 1  ??? Dates of hospitalizations: 09/2020    Pulmonary Therapies:   ?? Airway clearance: SmartVest twice a day; Aerobika 2 times per day; Hypertonic Saline 7% nebulized 2 times per day.   ??? Inhaled antibiotics: Cayston this month (ended this AM), Colistin to start this week.  ??? Inhalers: Spiriva Respimat; Advair 230/4.5 HFA  ??? Chronic antibiotics: azithromycin 250 mg daily (start 06/2020), Bactrim 400/80 twice a day (start 04/2020)  ??? Exercise: Not formally.  ??? Pulmonary Rehab: Hasn't done  ??? Supplemental oxygen: 3.5L with activity. Wearing same at rest.  On tanks, uses 2L. Able to take off oxygen for 30 minutes and sats 92%.  ??? NIPPV: CPAP 14 cm H2O with oxygen bled in at 4L    Past Medical History:   Diagnosis Date   ??? Allergic rhinitis    ??? Alpha-1-antitrypsin deficiency carrier     with slightly low levels   ??? Amblyopia    ??? Asthma    ??? Asthma, extrinsic    ??? Bronchiectasis (CMS-HCC) 06/12/2011   ??? Chronic sinusitis    ??? Colon polyps    ??? COVID-19 10/24/2019   ??? Diabetes mellitus (CMS-HCC)    ??? GERD (gastroesophageal reflux disease)    ??? H/O Mycobacterium avium complex infection    ??? Impaired glucose tolerance    ??? Obesity    ??? Pneumonia    ??? Pseudomonas respiratory infection    ??? 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 Sleep apnea    ??? Strabismus    ??? Strep throat        Past Surgical History:   Procedure Laterality Date   ??? BRONCHOSCOPY     ??? LUNG BIOPSY     ??? PR BRONCHOSCOPY,DIAGNOSTIC W BRUSH Bilateral 08/21/2013    Procedure: BRONCHOSCOPY, RIGID OR FLEXIBLE, INCLUDING FLOURO GUIDED; DIAGNOSTIC, WITH BRUSHING OR PROTECTED BRUSHINGS;  Surgeon: Nash Dimmer, MD;  Location: [facility];  Service: Pulmonary   ??? PR BRONCHOSCOPY,DIAGNOSTIC W LAVAGE Bilateral 08/21/2013    Procedure: BRONCHOSCOPY, RIGID OR FLEXIBLE, INCLUDE FLUOROSCOPIC GUIDANCE WHEN PERFORMED; W/BRONCHIAL ALVEOLAR LAVAGE;  Surgeon: Nash Dimmer, MD;  Location: [facility];  Service: Pulmonary   ??? PR COLONOSCOPY FLX DX W/COLLJ SPEC WHEN PFRMD N/A 03/21/2015    Procedure: COLONOSCOPY, FLEXIBLE, PROXIMAL TO SPLENIC FLEXURE; DIAGNOSTIC, W/WO COLLECTION SPECIMEN BY BRUSH OR WASH;  Surgeon: Brendia Sacks, MD;  [facility];  Service: Gastroenterology   ??? SHOULDER SURGERY Right        Family History   Problem Relation Age of Onset   ??? Colon cancer Father    ??? Emphysema Maternal Uncle    ??? Heart disease Maternal Uncle    ??? Diabetes Paternal Grandfather        Social History     Tobacco Use   ??? Smoking status: Never Smoker   ???  Smokeless tobacco: Former Estate agent Use Topics   ??? Alcohol use: No     Alcohol/week: 0.0 standard drinks   ??? Drug use: No       Allergies  Reviewed on 04/01/2021      Reactions Comments    Codeine Shortness Of Breath, Rash     Rifampin  Fevers, chills.    Bamlanivimab Other (See Comments) Low Back pain    Daliresp [roflumilast] Other (See Comments) Back pain          Current Outpatient Medications   Medication Sig Dispense Refill   ??? acetaminophen (TYLENOL) 500 MG tablet Take 1 tablet by mouth as needed for pain     ??? albuterol 2.5 mg /3 mL (0.083 %) nebulizer solution Inhale the contents of 1 vial (3 ml) via nebulizer twice daily and every 6 hours as needed for shortness of breath and wheezing 360 mL 5   ??? albuterol HFA 90 mcg/actuation inhaler Inhale 2 puffs every four (4) hours as needed for wheezing 18 g 11   ??? atorvastatin (LIPITOR) 20 MG tablet Take 1 tablet (20 mg total) by mouth daily. 90 tablet 3   ??? azithromycin (ZITHROMAX) 250 MG tablet Take 1 tablet (250 mg total) by mouth daily. 30 tablet 11   ??? aztreonam lysine (CAYSTON) 75 mg/mL Nebu nebulization solution Inhale 1 mL (75 mg total) by nebulization every eight (8) hours. Nebulize every other month after airway clearance using Altera nebulizer 90 mL 5   ??? blood-glucose meter kit Check sugars once daily. 1 each 0   ??? cetirizine (ZYRTEC) 10 MG tablet Take 1 tablet (10 mg total) by mouth daily. 90 tablet 3   ??? cholecalciferol, vitamin D3, (VITAMIN D3) 2,000 unit cap Take 1 capsule (2,000 Units total) by mouth daily. 30 capsule prn   ??? colistimethate (COLYMYCIN) 150 mg injection Mix 2mL of colistin with 3mL sterile water. Nebulize twice daily for 28 days every other month 112 mL 5   ??? empagliflozin (JARDIANCE) 25 mg tablet Take 1 tablet (25 mg total) by mouth daily. 90 tablet 3   ??? fexofenadine (ALLEGRA) 180 MG tablet Take 1 tablet (180 mg total) by mouth daily. (Patient taking differently: Take 180 mg by mouth in the morning. Alternates with zyrtec.) 90 tablet 3   ??? fluticasone propion-salmeteroL (ADVAIR HFA) 230-21 mcg/actuation inhaler Inhale 2 puffs Two (2) times a day. 36 g 3   ??? fluticasone propionate (FLONASE) 50 mcg/actuation nasal spray Use 2 sprays in each nostril daily. 16 g 11   ??? lancets Misc 1 each by Other route once daily. Test daily before breakfast. Dispense 90 day supply. 100 each 3   ??? metFORMIN (GLUCOPHAGE-XR) 500 MG 24 hr tablet Take 4 tablets (2,000 mg total) by mouth daily with evening meal. 360 tablet 3   ??? MISCELLANEOUS MEDICAL SUPPLY MISC Frequency:PHARMDIR   Dosage:0.0     Instructions:  Note:CPAP 15 cm H20 with heated humidity for nasal dryness.  Mask: ResMed Quattro size Medium. Dx OSA. Dose: 1     ??? montelukast (SINGULAIR) 10 mg tablet Take 1 tablet (10 mg total) by mouth nightly. 90 tablet 3   ??? nebulizers (ALTERA NEBULIZER SYSTEM) Misc Please use with Cayston medication. 1 each 0   ??? nebulizers Misc Use with inhaled medication. 3 each 3   ??? nebulizers Misc USE AS DIRECTED WITH COLISTIN 1 each 5   ??? needle, disp, 21 G 21 gauge x 1 1/2 Ndle Use to draw  up inject normal saline into colistin vial and to withdraw colistin dose for inhalation 100 each 5   ??? omeprazole (PRILOSEC) 20 MG capsule Take 1 capsule (20 mg total) by mouth two (2) times a day. 180 capsule 3   ??? sod bicarb-sod chlor-neti pot pkdv Use twice per day     ??? sodium chloride (NS) 0.9 % injection Inject 2mL of 0.9%NaCl into colistin vial & gently mix. After withdrawing colistin dose, add an additional 1mL of 0.9%NaCl to neb cup with the colistin dose. 180 mL 5   ??? sodium chloride 7% 7 % Nebu Inhale 4 mL by nebulization Two (2) times a day. 720 mL 3   ??? sulfamethoxazole-trimethoprim (BACTRIM) 400-80 mg per tablet Take 1 tablet (80 mg of trimethoprim total) by mouth Two (2) times a day. 180 tablet 3   ??? tiotropium bromide (SPIRIVA RESPIMAT) 2.5 mcg/actuation inhalation mist Inhale 2 puffs daily. 4 g 11     No current facility-administered medications for this visit.     Physical Exam:  BP 121/80  - Pulse 71  - Temp 36.6 ??C (97.9 ??F) (Temporal)  - Resp 16  - Ht 167.6 cm (5' 6)  - Wt 84.4 kg (186 lb)  - SpO2 95% Comment: 3 liters of room oxygen - BMI 30.02 kg/m??   Obese white male appearing comfortable and in no acute distress. Easy work of breathing without accessory muscle use.  Somewhat rhonchorous breath sounds over mid to lower lung fields. Breath sounds diminished over bases. No wheezing. Speaking in full sentences easily.  Regular rate and rhythm with normal S1 and S2.  No murmurs, rubs, or gallops.  Abdomen distended. No clubbing.    Diagnostic Review:   The following data were reviewed during this visit with key findings summarized below:    Pulmonary Function Testing: Spirometry obtained today suggestive of moderate restrictive lung disease. Measures have improved since prior.    ?? FVC (% predicted) FEV1 (% predicted) FEV1/FVC   05/12/13 3.48 L (80%) 2.92 L (85%) 84%   06/26/14 2.84 L (65%) 2.29 L (67%) 81%   03/25/17 2.95 L (67%) 2.44 L (71%) 83%   06/29/17 2.68 L (62%) 2.34 L (70%) 87%   11/23/17 2.62 L (61%) 2.20 L (66%) 84%   03/08/18 2.59 L (60%) 2.06 L (62%) 80%   06/21/18 2.76 L (64%) 2.04 L (62%) 74%   11/01/18 2.63 L (61%) 2.08 L (63%) 79%   01/09/20 2.29 L (55%) 1.84 L (55%) 80%   04/02/20 2.27 L (54%) 1.91 L (58%) 84%   09/12/20 2.11 L (51%) 1.54 L (47%) 73%   12/13/20 2.17 L (52%) 1.59 L (48%) 73%   04/01/21 2.34 L (56%) 1.89 L (57%) 81%   (12/13/20): Ambulated 368 m. Starting oxygen saturation of 95%. Desaturated to 88% on RA at minute 2. Supplemental oxygen at 3L flow added with improvement of saturation. Was able ot complete walk and maintain saturations above 88%.    (09/12/20): Ambulated 366 m.  Starting oxygen saturation of 97%.  Nadir oxygen saturation of 91% at minutes 4 and minutes 6.  Did not require supplemental oxygen during the walk test.    Cultures:  ?? Source Bacterial Culture AFB Smear AFB Culture   03/25/17 Sputum 3+ OPF; 3+ Stenotrophomonas negative negative   06/29/17 Sputum OPF negative Overgrown   01/28/18 Sputum 2+ OPF; 2+ GNR glucose non-fermenter; Scedosporium negative 2 Streptomyces morph   02/01/18 Sputum 3+ OPF; 1+ GNR glucose  non-fermenter; Scedosporium negative Streptomyces   03/02/18 Sputum 3+ OPF; 3+ GNR glucose non-fermenter; Scedosporium negative Streptomyces   08/07/19 Sputum 3+ OPF; 3+ B gladioli negative Negative   10/17/19 Sputum 2+ OPF; 4+ B gladioli negative Overgrown   01/09/20 Sputum 2+ OPF; 4+ B gladioli negative Overgrown   04/25/20 Sputum 4+ B gladioli negative Overgrown   06/28/20 Sputum 3+ B gladioli negative Overgrown   09/16/20 Sputum 3+ OPF; 3+ B gladioli - -   11/14/20 Sputum 3+ OPF; 2+ B gladioli; 2+ PsA; A terreus negative Overgrown   01/14/21 Sputum 2+ OPF; 3+ PsA negative M kubicae   ??Previously grew PsA 09/10/2011      Chest Imaging  Chest CT (09/16/20): Images personally reviewed. Clear central tracheobronchial tree. Interval increase in bronchial wall thickening and bronchiectasis involving the lingula and bilateral lung bases when compared to 03/18/2018. Interval worsening of bilateral nodular/tree-in-bud opacities. For reference: Right upper lobe nodule measuring 0.6 cm (2:28), right middle lobe nodule measuring 1.0 cm (2:53). No pleural effusion.    Chest CT (03/18/18): Images personally reviewed. Bronchial wall thickening and bronchiectasis primarily involving the airways supplying the lingula and lung bases with associated clustered micronodular and tree-in-bud opacities. With respect to 02/02/2018, there is increased opacification at the deep posterior costophrenic recesses with more volume loss particularly at the left base, favored to represent atelectasis.  ??  Chest CT (02/02/18): Images personally reviewed. Bronchiectasis predominantly in bases of lingula, right middle lobe, and bilateral lower lobes (left greater than right), mild to moderately progressed since 03/30/2017. ?? New and increased nodular and tree-in-bud opacities, now involving all lobes. Patchy consolidations in the bases of the lingula, and lower lobes also stable to mildly increased. Also new left upper lobe nodular opacity with adjacent groundglass, 8 mm, (2:26). No definite reticular opacities, or honeycombing identified. No appreciable air trapping on expiratory imaging.  ??  Chest CT (03/30/17): Images personally reviewed. Interval worsening of bilateral lower lobe bronchiectasis and bronchial wall thickening with new lingular bronchiectasis. Increased clustered groundglass opacities with areas of consolidation in the lower lobes and lingula. Findings may represent sequela of endobronchial infection or impaired mucus clearance.  Bronchiectasis has been present on CT imaging since 06/12/11.    Other Imaging:  Liver US (02/03/21): Echogenic liver compatible with history of hepatic steatosis. No focal mass. Mild hepatosplenomegaly, unchanged. Shear velocity measurements with a mean liver stiffness value less than 1.7 m/sec (less than 9 kPa). SRU recommendation - In the absence of other known clinical signs, rules out compensated advanced chronic liver disease (cACLD). If there are known clinical signs, may need further test for confirmation.     Cardiac:   ECHO (01/14/21): The left ventricle is normal in size with normal wall thickness.The left ventricular systolic function is normal, LVEF is visuallyestimated at 60-65%. The right ventricle is normal in size, with normal systolic function. No evidence of an interatrial communication or intrapulmonary shunt. Agitated saline study is negative.    EKG (06/28/20): HR 66 with QTc . Normal sinus rhythm.    Portions of this record have been created using Scientist, clinical (histocompatibility and immunogenetics). Dictation errors have been sought, but may not have been identified and corrected.    Viona Gilmore, MD  04/01/21  8:51 AM

## 2021-03-30 NOTE — Unmapped (Addendum)
Referral to telehealth pulmonary rehab.    Call [facility] for the Colistin.    Call Dr. Hansel Starling office to schedule a visit with him.    Checking methemoglobin level today. If elevated, will stop Bactrim.    Strongly encourage the COVID booster.    Symptoms of a bronchiectasis exacerbation: 48 hours of at least two of the following:  Increased cough  Change in volume or appearance of sputum  Increased sputum purulence  Worsening shortness of breath and/or exercise tolerance  Fatigue and/or malaise  Coughing up blood (hemoptysis)    If you are experiencing some of these symptoms:  Increase the frequency and/or intensity of your airway clearance  Try to submit a sputum sample for bacterial and AFB cultures (at [facility] or locally)  Reach out to me or your local physician.  If you need antibiotics, recommend treating for 14 days.      Please bring any new airway clearance equipment to your next visit to ensure that you are using and caring for it properly.    Information about getting your COVID-19 vaccine or COVID-19 treatment: yourshot.org    Interested in participating in trials for a COVID-19 vaccine or treatments for COVID-19? https://www.coronaviruspreventionnetwork.org    Thank you for allowing me to be a part of your care. Please call the clinic with any questions.    Viona Gilmore, MD, MPH  Pulmonary and Critical Care Medicine  [address]  [id number]  [place], [place] [address]    Thank you for your visit to the [facility]monary [facility]. You may receive a survey from [facility] regarding your visit today, and we are eager to use this feedback to improve your experience. Thank you for taking the time to fill it out.    Between appointments, you can reach [place] at these numbers:  For appointments: [phone]  For my nurse, Arsenio Loader RN: [phone]  Fax: [phone]  For urgent issues after hours: Hospital Operator @ [phone] & ask for Pulmonary Fellow on call    For further information, check out the websites below:    [facility] for Bronchiectasis: [email or web address]    Impact Airway Clearance Education: http://www.impact-be.com    Videos from the patient session of the World Bronchiectasis Conference in [place] on May 15, 2017 can be watched here (TrustyNews.es).     Bronchiectasis Toolbox (information about airway clearance): DiningCalendar.de    Copy for Individuals with Bronchiectasis and/or NTM through the Bronchiectasis Registry and the COPD Foundation: https://www.mckee-young.org/    Information about Non-tuberculous Mycobacterial Infections:  https://www.ntminfo.org     Interested in clinical trials and other research opportunities?  www.clinicaltrials.gov    The Rare Diseases Clinical Research Network [facility]) Genetic Disorders of  Mucociliary Clearance Consortium [facility]) Contact Registry is a way for patients with disorders of mucociliary clearance (such as bronchiectasis) and their family members to learn about research studies they may be able to join. Participation is completely voluntary and you may choose to withdraw at any time. There is no cost to join the [employer]. For more information or to join the registry please go to the following website:  [email or web address].hu

## 2021-04-01 ENCOUNTER — Encounter
Admit: 2021-04-01 | Discharge: 2021-04-01 | Payer: PRIVATE HEALTH INSURANCE | Attending: Internal Medicine | Primary: Internal Medicine

## 2021-04-01 ENCOUNTER — Encounter: Admit: 2021-04-01 | Discharge: 2021-04-01 | Payer: PRIVATE HEALTH INSURANCE

## 2021-04-01 DIAGNOSIS — J479 Bronchiectasis, uncomplicated: Principal | ICD-10-CM

## 2021-04-01 DIAGNOSIS — Z79899 Other long term (current) drug therapy: Principal | ICD-10-CM

## 2021-04-01 LAB — METHEMOGLOBIN: METHEMOGLOBIN: <1 % (ref <1.5)

## 2021-04-01 NOTE — Unmapped (Signed)
Patient is in clinic today and I asked if he has ever received his ONO and he has not.  He also doesn't have a back-up tank for his home concentrator which he should have.  He says he has had some billing issues with [facility] but has gotten that straightened out.  He also is interested in being evaluated for a POC if he is allowed to also keep his tanks.  I told him I would check on that for him.

## 2021-04-02 NOTE — Unmapped (Signed)
Faxed referral and PFT and e-faxed note to [employer].  Patient notified via MyChart

## 2021-04-02 NOTE — Unmapped (Signed)
[facility] Refill Coordination Note    Specialty Medication(s) to be Shipped:   CF/Pulmonary: -colisthimethate 150mg  inj    Other medication(s) to be shipped: atorvastatin     *denied all other specialty and non-specialty medications     Brett Simpler Sr., DOB: [birth year]  Phone: [phone] (home)       All above HIPAA information was verified with patient's family member, wife.     Was a Nurse, learning disability used for this call? No    Completed refill call assessment today to schedule patient's medication shipment from the [facility] [phone]).  All relevant notes have been reviewed.     Specialty medication(s) and dose(s) confirmed: Regimen is correct and unchanged.   Changes to medications: [name] reports no changes at this time.  Changes to insurance: No  New side effects reported not previously addressed with a pharmacist or physician: None reported  Questions for the pharmacist: No    Confirmed patient received a Conservation officer, historic buildings and a Surveyor, mining with first shipment. The patient will receive a drug information handout for each medication shipped and additional FDA Medication Guides as required.       DISEASE/MEDICATION-SPECIFIC INFORMATION        N/A    SPECIALTY MEDICATION ADHERENCE     Medication Adherence    Specialty Medication: colisthimethate 150mg  injection  Support network for adherence: family member              Were doses missed due to medication being on hold? No    Unable to confirm quantity on hand    REFERRAL TO PHARMACIST     Referral to the pharmacist: Not needed      [facility]     Shipping address confirmed in Epic.     Delivery Scheduled: Yes, Expected medication delivery date: 6/7.     Medication will be delivered via UPS to the prescription address in Epic WAM.    Westley Gambles   [facility] Specialty Technician

## 2021-04-03 ENCOUNTER — Encounter
Admit: 2021-04-03 | Discharge: 2021-04-03 | Payer: PRIVATE HEALTH INSURANCE | Attending: Student in an Organized Health Care Education/Training Program | Primary: Student in an Organized Health Care Education/Training Program

## 2021-04-03 ENCOUNTER — Encounter: Admit: 2021-04-03 | Discharge: 2021-04-03 | Payer: PRIVATE HEALTH INSURANCE

## 2021-04-03 DIAGNOSIS — J310 Chronic rhinitis: Principal | ICD-10-CM

## 2021-04-03 MED ORDER — BUDESONIDE 32 MCG/ACTUATION NASAL SPRAY
Freq: Every day | NASAL | 11 refills | 0 days | Status: CP
Start: 2021-04-03 — End: 2022-04-03
  Filled 2021-04-08: qty 8.43, 30d supply, fill #0

## 2021-04-03 NOTE — Unmapped (Signed)
Otolaryngology Clinic Note    [name] is a 55 y.o. male is seen in consultation at the request of Referred Self  for evaluation of chronic sinusitis.        History of Present Illness:     The patient is a 55 y.o. 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 who presents for the evaluation of CRS.  Patient sees Dr. Reuel Boom for bronchiectasis 2/2 alpha-1 antitrypsin carrier state with prior NTM infection and hx of OSA.     Patient reports multiply recurrent sinus infections as well as pulmonary infections.  Is currently on a course of Augmentin for 14 days.  He started this on Tuesday.    Reports chronic nasal obstruction that is bilateral in nature.  This makes wearing his CPAP difficult however he does wear it every night.  Reports in general the settings are high but he does not know what the numbers are.  Report he frequently wakes up with a completely dry mouth.  Sinus standpoint his wife reports his sense of smell is generally intact.  Thick chronic drainage from his nose.  This is usually clear but during exacerbations can become cloudy.  Reports diffuse facial pressure.  He has never had sinus surgery.  He has never had significant facial trauma.  He is currently on disability but [place].    Patient reports is been unable to tolerate a Neomed bottle.  He reports he has lots of ear pressure following this and gets frequent ear infections.    Update 03/08/2018:  [name] is a 55 y.o. male who last seen in 01/14/2018.  He reports he  has been insistent bilateral nasal obstruction sinus pressure lots of postnasal drip.  Reports he has been irrigating his nose with minimal improvement.  Reports he takes multiple allergy medicines daily.  He is being worked up for some worsening self-limited hemoptysis and wheezing Dr. Garner Nash.  Is been started on a course of Augmentin for 3 weeks based on previous cultures.  She is also obtaining a CT scan.    From a nasal standpoint the patient reports that this takes second priority to his recent increasing shortness of breath however it is very bothersome to him.  CT scan last week.  Been in for follow-up.    Update 01/03/2019:  [name] is a 55 y.o. male who last seen in 03/08/18.  Reports overall has been doing okay.  He feels like when he is on Bactrim his nose works pretty well.  He has been irrigating his nose every other day.  He reports that when does it he feels that he has increased mucus that day however the next day he has substantial improvement.  He reports he uses room temperature water.  He does not warm it.  He also complains of ear pain when he uses the irrigations.  He is never tried the lavage.  He has tried the [employer] pot and reports this was torture.    Update 04/03/2021:  [name] is a 55 y.o. male who last seen in 2020.  He reports he  has been doing  Ok.  He's been on home O2 2-4 L/min.  He can't use the humidifier.  He reports he has thick dry mucus and crust in his nose.  He's been using flonase bid and saline rinses qday.  He feels congested and gets crust out of his nasal cavity most days.  He does feel like the spring was worse with irrigations to help during pollen season.  Feels like if he does not more than every other day and make things worse.      A 12 point review of systems was negative except as indicated.  The patient denies fevers, chills, shortness of breath, chest pain, nausea, vomiting, diarrhea, inability to lie flat, dysphagia, odynophagia, hemoptysis, hematemesis, changes in vision, changes in voice quality, otalgia, otorrhea, vertiginous symptoms, focal deficits, or other concerning symptoms.    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    Past Surgical History     has a past surgical history that includes Bronchoscopy; Lung biopsy; pr bronchoscopy,diagnostic w lavage (Bilateral, 08/21/2013); pr bronchoscopy,diagnostic w brush (Bilateral, 08/21/2013); Shoulder surgery (Right); and pr colonoscopy flx dx w/collj spec when pfrmd (N/A, 03/21/2015).    Current Medications    Current Outpatient Medications   Medication Sig Dispense Refill   ??? acetaminophen (TYLENOL) 500 MG tablet Take 1 tablet by mouth as needed for pain     ??? albuterol 2.5 mg /3 mL (0.083 %) nebulizer solution Inhale the contents of 1 vial (3 ml) via nebulizer twice daily and every 6 hours as needed for shortness of breath and wheezing 360 mL 5   ??? albuterol HFA 90 mcg/actuation inhaler Inhale 2 puffs every four (4) hours as needed for wheezing 18 g 11   ??? atorvastatin (LIPITOR) 20 MG tablet Take 1 tablet (20 mg total) by mouth daily. 90 tablet 3   ??? azithromycin (ZITHROMAX) 250 MG tablet Take 1 tablet (250 mg total) by mouth daily. 30 tablet 11   ??? aztreonam lysine (CAYSTON) 75 mg/mL Nebu nebulization solution Inhale 1 mL (75 mg total) by nebulization every eight (8) hours. Nebulize every other month after airway clearance using Altera nebulizer 90 mL 5   ??? blood-glucose meter kit Check sugars once daily. 1 each 0   ??? cetirizine (ZYRTEC) 10 MG tablet Take 1 tablet (10 mg total) by mouth daily. 90 tablet 3   ??? cholecalciferol, vitamin D3, (VITAMIN D3) 2,000 unit cap Take 1 capsule (2,000 Units total) by mouth daily. 30 capsule prn   ??? colistimethate (COLYMYCIN) 150 mg injection Mix 2mL of colistin with 3mL sterile water. Nebulize twice daily for 28 days every other month 112 mL 5   ??? empagliflozin (JARDIANCE) 25 mg tablet Take 1 tablet (25 mg total) by mouth daily. 90 tablet 3   ??? fexofenadine (ALLEGRA) 180 MG tablet Take 1 tablet (180 mg total) by mouth daily. (Patient taking differently: Take 180 mg by mouth in the morning. Alternates with zyrtec.) 90 tablet 3   ??? fluticasone propion-salmeteroL (ADVAIR HFA) 230-21 mcg/actuation inhaler Inhale 2 puffs Two (2) times a day. 36 g 3   ??? fluticasone propionate (FLONASE) 50 mcg/actuation nasal spray Use 2 sprays in each nostril daily. 16 g 11   ??? lancets Misc 1 each by Other  route once daily. Test daily before breakfast. Dispense 90 day supply. 100 each 3   ??? metFORMIN (GLUCOPHAGE-XR) 500 MG 24 hr tablet Take 4 tablets (2,000 mg total) by mouth daily with evening meal. 360 tablet 3   ??? MISCELLANEOUS MEDICAL SUPPLY MISC Frequency:PHARMDIR   Dosage:0.0     Instructions:  Note:CPAP 15 cm H20 with heated humidity for nasal dryness.  Mask: ResMed Quattro size Medium. Dx OSA. Dose: 1     ??? montelukast (SINGULAIR) 10 mg tablet Take 1 tablet (10 mg total) by mouth nightly. 90 tablet 3   ??? nebulizers (ALTERA NEBULIZER SYSTEM) Misc Please use with Cayston medication. 1 each 0   ??? nebulizers Misc Use with inhaled medication. 3 each 3   ??? nebulizers Misc USE AS DIRECTED WITH COLISTIN 1 each 5   ??? needle, disp, 21 G 21 gauge x 1 1/2 Ndle Use to draw up inject normal saline into colistin vial and to withdraw colistin dose for inhalation 100 each 5   ??? omeprazole (PRILOSEC) 20 MG capsule Take 1 capsule (20 mg total) by mouth two (2) times a day. 180 capsule 3   ??? sod bicarb-sod chlor-neti pot pkdv Use twice per day     ??? sodium chloride (NS) 0.9 % injection Inject 2mL of 0.9%NaCl into colistin vial & gently mix. After withdrawing colistin dose, add an additional 1mL of 0.9%NaCl to neb cup with the colistin dose. 180 mL 5   ??? sodium chloride 7% 7 % Nebu Inhale 4 mL by nebulization Two (2) times a day. 720 mL 3   ??? sulfamethoxazole-trimethoprim (BACTRIM) 400-80 mg per tablet Take 1 tablet (80 mg of trimethoprim total) by mouth Two (2) times a day. 180 tablet 3   ??? tiotropium bromide (SPIRIVA RESPIMAT) 2.5 mcg/actuation inhalation mist Inhale 2 puffs daily. 4 g 11     No current facility-administered medications for this visit.       Allergies    Allergies   Allergen Reactions   ??? Codeine Shortness Of Breath and Rash   ??? Rifampin      Fevers, chills.   ??? Bamlanivimab Other (See Comments)     Low Back pain   ??? Daliresp [Roflumilast] Other (See Comments)     Back pain       Family History    Negative for bleeding disorders or free bleeding.     family history includes Colon cancer in his father; Diabetes in his paternal grandfather; Emphysema in his maternal uncle; Heart disease in his maternal uncle.    Social History:     reports that he has never smoked. He has quit using smokeless tobacco.   reports no history of alcohol use.   reports no history of drug use.    Review of Systems    A 12 system review of systems was performed and is negative other than that noted in the history of present illness.    Vital Signs  There were no vitals taken for this visit.     Physical Exam    General: Well-developed, well-nourished. Appropriate, comfortable, and in no apparent distress.  Head/Face: On external examination there is no obvious asymmetry or scars. On palpation there is no tenderness over maxillary sinuses or masses within the salivary glands. Cranial nerves V and VII are intact through all distributions.  Eyes: PERRL, EOMI, the conjunctiva are not injected and sclera is non-icteric.  Ears: On external exam, there is no obvious lesions or asymmetry. The EACs  are bilaterally without cerumen or lesions. The TMs are in the neutral position and are mobile to pneumatic otoscopy bilaterally. There are no middle ear masses or fluid noted. Hearing is grossly intact bilaterally.  Nose: On external exam there are neither lesions nor asymmetry of the nasal tip/ dorsum. On anterior rhinoscopy, visualization posteriorly is limited on anterior examination. For this reason, to adequately evaluate posteriorly for masses, polypoid disease and/or signs of infections, nasal endoscopy is indicated (see procedure below).  Oral cavity/oropharynx: The mucosa of the lips, gums, hard and soft palate, posterior pharyngeal wall, tongue, floor of mouth, and buccal region are without masses or lesions and are normally hydrated. Good dentition. Tongue protrudes midline. Tonsils are normal appearing. Supraglottis not visualized due to gag reflex.  Neck: There is no asymmetry or masses. Trachea is midline. There is no enlargement of the thyroid or palpable thyroid nodules.   Lymphatics: There is no palpable lymphadenopathy along the jugulodiagastric, submental, or posterior cervical chains.  Chest: No audible wheeze, unlabored respirations.  Cardiovascular: Regular rate.  GI: Nondistended.  Neurologic: Cranial nerve???s II-XII are grossly intact. Exam is non-focal.  Extremities: No cyanosis, clubbing or edema.    Procedures:    Sinonasal Endoscopy (CPT [id number]) performed today: To better evaluate the patient???s symptoms, sinonasal endoscopy is indicated.  After discussion of risks and benefits, and topical decongestion and anesthesia, an endoscope was used to perform nasal endoscopy on each side. A time out identifying the patient, the procedure, the location of the procedure and any concerns was performed prior to beginning the procedure.    Findings:   Copious clear mucus.  There is a long septal shelf along the inferior turbinate on the left.  Mucosa mildly inflamed.  There is no purulence.  No polyps or pus.      Examination on the right reveals a broad anterior septal deviation to the right.  This obstructs visualization of the middle turbinate.  The inflamed mucosa.  There is copious clear mucus.  There is no mucopurulence or no polyps or pus.      Oretha Ellis Nasal Endoscopy Score    Left        ?? Polyps:  Absent (0)   ?? Edema:   Mild (1)   ?? Discharge:  Clear, Thin (1)    ?? Scarring:  Absent (0)   ?? Crusting:  None (0)      Total Left:  2     Right         ?? Polyps:  Absent (0)   ?? Edema:  Mild (1)   ?? Discharge: Clear, Thin (1)    ?? Scarring:  Absent (0)   ?? Crusting:  None (0)      Total Right:   2      Labs and Diagnostic Tests  An MRI was reviewed from 2015 that was obtained for other reasons.  This demonstrates a broad right-sided septal deviation.  No substantial sinus disease was noted on this limited study.    CT scan was personally reviewed and gone over with the patient.  This demonstrates a prominent septal spur and shelf along the left side abutting the inferior turbinate.  On the right it shows substantial inferior turbinate hypertrophy with anterior deflection.      Assessment:  The patient is a 55 y.o. 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 who presents for the evaluation of: Nasal obstruction chronic sinusitis, septal deviation, instructive sleep apnea    Recommendations:  1. [name] has thick clear mucus in the nasal cavity.  Encouraged him to continue with his rinses.  We will change him from Flonase to Annan fluticasone based intranasal corticosteroid.  Also encouraged him to use saline gel.  Provided 1 with a Neomed handout.  I will plan on seeing him back on October 7 with Dr. Garner Nash     The patient voiced complete understanding of plan as detailed above and is in full agreement.

## 2021-04-07 DIAGNOSIS — J479 Bronchiectasis, uncomplicated: Principal | ICD-10-CM

## 2021-04-07 NOTE — Unmapped (Signed)
Brett Fogo Clyatt Sr. 's colistin shipment will be delayed as a result of a high copay.     I have reached out to the patient  at [phone] and communicated the delay. We will call the patient back to reschedule the delivery upon resolution. We have not confirmed the new delivery date.

## 2021-04-09 NOTE — Unmapped (Signed)
Brett Fogo Wolman Sr. 's colistimethate shipment will be canceled  as a result of a high copay.     I have reached out to the patient  at [phone] and communicated the delivery change. We will not reschedule the medication and have removed this/these medication(s) from the work request.  We have canceled this work request.

## 2021-04-10 NOTE — Unmapped (Addendum)
Spoke to Ms. Molder and BrettHopes.     They had seen results of his lower respiratory culture:      Lower Respiratory Culture 4+ Mucoid Pseudomonas aeruginosa??Abnormal??       3+ Smooth Pseudomonas aeruginosa??Abnormal??       2+ Oropharyngeal Flora Isolated          Specimen Source: SPUTUM EXPECTORATED        Wanted to know if any treatment was needed at this time.     Asked how he was feeling overall since we saw him in clinic.     Brett Hanna is reporting bad coughing spells, no energy and overall just not feeling well at this this time.     Brett Hanna has grown pseudomonas in the past.    Let Ms. Punt know that I would notify Dr Garner Nash.

## 2021-04-16 MED FILL — AZITHROMYCIN 250 MG TABLET: ORAL | 30 days supply | Qty: 30 | Fill #3

## 2021-04-16 MED FILL — SULFAMETHOXAZOLE 400 MG-TRIMETHOPRIM 80 MG TABLET: ORAL | 90 days supply | Qty: 180 | Fill #1

## 2021-04-16 MED FILL — OMEPRAZOLE 20 MG CAPSULE,DELAYED RELEASE: ORAL | 90 days supply | Qty: 180 | Fill #3

## 2021-04-21 DIAGNOSIS — J479 Bronchiectasis, uncomplicated: Principal | ICD-10-CM

## 2021-04-21 NOTE — Unmapped (Signed)
Faxed prescription for POC and evaluation to [facility] at [facility]  Let him know patient is only interested if he can also keep his portable tanks.  Patient notified via MyChart.

## 2021-04-22 DIAGNOSIS — J479 Bronchiectasis, uncomplicated: Principal | ICD-10-CM

## 2021-04-22 DIAGNOSIS — A498 Other bacterial infections of unspecified site: Principal | ICD-10-CM

## 2021-04-22 MED ORDER — CAYSTON 75 MG/ML SOLUTION FOR NEBULIZATION
Freq: Three times a day (TID) | RESPIRATORY_TRACT | 3 refills | 30.00000 days | Status: CP
Start: 2021-04-22 — End: 2022-04-22

## 2021-04-22 NOTE — Unmapped (Signed)
Adult Pulmonary Specialty Clinic Pharmacist Note      Updated medication orders for  [facility] Will use cayston monthly while in the donut hole as colistin is very expensive. Once out of the donut hole, will resume alternating cayston and colistin every 28 days.      1. Infection, Pseudomonas  - aztreonam lysine (CAYSTON) 75 mg/mL Nebu nebulization solution; Inhale 1 mL (75 mg total) by nebulization every eight (8) hours. Nebulize every month (while in the donut hole) after airway clearance using Altera nebulizer  Dispense: 90 mL; Refill: 3      Pharmacy sent to:  [facility]    Electronically signed by:  Alben Spittle, PharmD, BCACP, CPP  Clinical Pharmacist Practitioner  [facility]  [phone]

## 2021-04-22 NOTE — Unmapped (Signed)
Spoke with representative at [employer] re change in prescription to monthly from every other month.  They would like prescription rerouted to [facility], p [phone], f [phone].  Confirmed with [facility] that okay to send new prescription electronically and new prescription sent.  Spoke with pt and his wife re continuing to take Cayston whenever he is waiting on Colistin due to donut hole with insurance.  Thanks, Harriett Sine

## 2021-04-23 DIAGNOSIS — J479 Bronchiectasis, uncomplicated: Principal | ICD-10-CM

## 2021-04-23 DIAGNOSIS — A498 Other bacterial infections of unspecified site: Principal | ICD-10-CM

## 2021-04-23 MED ORDER — CAYSTON 75 MG/ML SOLUTION FOR NEBULIZATION
Freq: Three times a day (TID) | RESPIRATORY_TRACT | 3 refills | 28 days
Start: 2021-04-23 — End: 2022-04-23

## 2021-04-23 NOTE — Unmapped (Signed)
Rec'd call fr [facility] pharmacy to clarify directions for Cayston (continuous) and to change disp amt to 84 ml/month supply.  Confirmed that pt will take continuously when Colistin not available.  Script amended to reflect 84 ml/month supply.

## 2021-05-02 NOTE — Unmapped (Signed)
Specialty Medication(s): Colistin    Brett Hanna has been dis-enrolled from the [facility] specialty pharmacy services due to patient declined refill and will most likely not fill for the rest of the year. .    Additional information provided to the patient: Will reassess in 2023 if he is ready to fill     Julianne Rice  [facility] Specialty Pharmacist

## 2021-05-14 MED FILL — VENTOLIN HFA 90 MCG/ACTUATION AEROSOL INHALER: RESPIRATORY_TRACT | 17 days supply | Qty: 18 | Fill #0

## 2021-05-14 MED FILL — METFORMIN ER 500 MG TABLET,EXTENDED RELEASE 24 HR: ORAL | 90 days supply | Qty: 360 | Fill #2

## 2021-05-14 MED FILL — AZITHROMYCIN 250 MG TABLET: ORAL | 30 days supply | Qty: 30 | Fill #4

## 2021-05-15 DIAGNOSIS — E119 Type 2 diabetes mellitus without complications: Principal | ICD-10-CM

## 2021-05-15 MED ORDER — FEXOFENADINE 180 MG TABLET
ORAL_TABLET | Freq: Every day | ORAL | 1 refills | 90.00000 days | Status: CP
Start: 2021-05-15 — End: 2022-01-14
  Filled 2021-05-20: qty 90, 90d supply, fill #0

## 2021-05-15 MED ORDER — BLOOD GLUCOSE TEST STRIPS
ORAL_STRIP | 11 refills | 0.00000 days | Status: CP
Start: 2021-05-15 — End: 2022-05-15

## 2021-05-15 MED ORDER — LANCETS
Freq: Every day | 3 refills | 0 days | Status: CP
Start: 2021-05-15 — End: ?

## 2021-05-15 MED ORDER — BLOOD-GLUCOSE METER KIT WRAPPER
0 refills | 0 days | Status: CP
Start: 2021-05-15 — End: 2022-05-15

## 2021-05-26 DIAGNOSIS — J479 Bronchiectasis, uncomplicated: Principal | ICD-10-CM

## 2021-05-26 MED ORDER — ALBUTEROL SULFATE HFA 90 MCG/ACTUATION AEROSOL INHALER
RESPIRATORY_TRACT | 2 refills | 51.00000 days | Status: CP | PRN
Start: 2021-05-26 — End: ?
  Filled 2021-05-29: qty 54, 51d supply, fill #0

## 2021-05-27 ENCOUNTER — Encounter: Admit: 2021-05-27 | Discharge: 2021-05-27 | Payer: PRIVATE HEALTH INSURANCE

## 2021-05-27 ENCOUNTER — Ambulatory Visit: Admit: 2021-05-27 | Discharge: 2021-05-27 | Payer: PRIVATE HEALTH INSURANCE

## 2021-05-27 DIAGNOSIS — R059 Cough: Principal | ICD-10-CM

## 2021-05-27 DIAGNOSIS — E119 Type 2 diabetes mellitus without complications: Principal | ICD-10-CM

## 2021-05-27 DIAGNOSIS — J9611 Chronic respiratory failure with hypoxia: Principal | ICD-10-CM

## 2021-05-27 DIAGNOSIS — K760 Fatty (change of) liver, not elsewhere classified: Principal | ICD-10-CM

## 2021-05-29 MED FILL — BUDESONIDE 32 MCG/ACTUATION NASAL SPRAY: NASAL | 30 days supply | Qty: 8.43 | Fill #1

## 2021-05-29 MED FILL — ALBUTEROL SULFATE 2.5 MG/3 ML (0.083 %) SOLUTION FOR NEBULIZATION: 30 days supply | Qty: 360 | Fill #3

## 2021-06-19 MED FILL — AZITHROMYCIN 250 MG TABLET: ORAL | 30 days supply | Qty: 30 | Fill #5

## 2021-07-10 MED FILL — ATORVASTATIN 20 MG TABLET: ORAL | 90 days supply | Qty: 90 | Fill #1

## 2021-07-10 MED FILL — SULFAMETHOXAZOLE 400 MG-TRIMETHOPRIM 80 MG TABLET: ORAL | 90 days supply | Qty: 180 | Fill #2

## 2021-07-15 DIAGNOSIS — E119 Type 2 diabetes mellitus without complications: Principal | ICD-10-CM

## 2021-07-21 MED FILL — AZITHROMYCIN 250 MG TABLET: ORAL | 30 days supply | Qty: 30 | Fill #6

## 2021-07-21 MED FILL — BUDESONIDE 32 MCG/ACTUATION NASAL SPRAY: NASAL | 30 days supply | Qty: 8.43 | Fill #2

## 2021-07-25 MED ORDER — OMEPRAZOLE 20 MG CAPSULE,DELAYED RELEASE
ORAL_CAPSULE | Freq: Two times a day (BID) | ORAL | 3 refills | 90 days | Status: CP
Start: 2021-07-25 — End: 2022-07-25
  Filled 2021-07-29: qty 180, 90d supply, fill #0

## 2021-07-29 MED FILL — CETIRIZINE 10 MG TABLET: ORAL | 90 days supply | Qty: 90 | Fill #2

## 2021-08-07 ENCOUNTER — Ambulatory Visit: Admit: 2021-08-07 | Discharge: 2021-08-08 | Payer: PRIVATE HEALTH INSURANCE

## 2021-08-08 ENCOUNTER — Ambulatory Visit
Admit: 2021-08-08 | Discharge: 2021-08-09 | Payer: PRIVATE HEALTH INSURANCE | Attending: Internal Medicine | Primary: Internal Medicine

## 2021-08-08 DIAGNOSIS — Z2239 Carrier of other specified bacterial diseases: Principal | ICD-10-CM

## 2021-08-08 DIAGNOSIS — J479 Bronchiectasis, uncomplicated: Principal | ICD-10-CM

## 2021-08-08 DIAGNOSIS — J4550 Severe persistent asthma, uncomplicated: Principal | ICD-10-CM

## 2021-08-08 DIAGNOSIS — Z148 Genetic carrier of other disease: Principal | ICD-10-CM

## 2021-08-08 MED ORDER — TOBRAMYCIN 300 MG/5 ML IN 0.225 % SODIUM CHLORIDE FOR NEBULIZATION
Freq: Two times a day (BID) | RESPIRATORY_TRACT | 5 refills | 28.00000 days | Status: CP
Start: 2021-08-08 — End: ?
  Filled 2021-10-01: qty 280, 56d supply, fill #0

## 2021-08-13 MED FILL — METFORMIN ER 500 MG TABLET,EXTENDED RELEASE 24 HR: ORAL | 90 days supply | Qty: 360 | Fill #3

## 2021-08-13 MED FILL — MONTELUKAST 10 MG TABLET: ORAL | 90 days supply | Qty: 90 | Fill #1

## 2021-08-13 MED FILL — AZITHROMYCIN 250 MG TABLET: ORAL | 30 days supply | Qty: 30 | Fill #7

## 2021-08-13 NOTE — Unmapped (Signed)
[facility] Specialty Medication Onboarding    Specialty Medication: tobramycin  Prior Authorization: Not Required   Financial Assistance: MAP referral pending  Final Copay/Day Supply: $131.90 / 30    Insurance Restrictions: None     Notes to Pharmacist: None    The triage team has completed the benefits investigation and has determined that the patient is able to fill this medication at [facility]. Please contact the patient to complete the onboarding or follow up with the prescribing physician as needed.

## 2021-08-13 NOTE — Unmapped (Signed)
[name] requested to hold tobramycin delivery until MAPs referral is completed for financial assistance. The current copay is $131.90.     He plans to start tobramycin at the beginning of next month as he is currently taking Cayston until the end of this month.     I will follow-up next week upon MAPs referral completion.     Oliva Bustard  [facility] Specialty Pharmacist

## 2021-08-14 NOTE — Unmapped (Signed)
Specialty Medication(s): tobramycin 300 mg/79mL    Brett Hanna has been dis-enrolled from the [facility] specialty pharmacy services due to financial limitations - Patient is unable to fill medication due to high copay. Clinic has been notified.     Additional information provided to the patient: Informed patient they will need to continue to call the pharmacy to request medication refills. Brett Hanna verbalized understanding.     Brett Hanna  [facility] Specialty Pharmacist

## 2021-09-17 MED FILL — AZITHROMYCIN 250 MG TABLET: ORAL | 30 days supply | Qty: 30 | Fill #8

## 2021-09-17 MED FILL — SODIUM CHLORIDE 7 % FOR NEBULIZATION: RESPIRATORY_TRACT | 90 days supply | Qty: 720 | Fill #1

## 2021-09-17 MED FILL — LC PLUS MISC: 56 days supply | Qty: 1 | Fill #2

## 2021-09-17 MED FILL — ALLERGY RELIEF (FEXOFENADINE) 180 MG TABLET: ORAL | 90 days supply | Qty: 90 | Fill #1

## 2021-09-17 MED FILL — BUDESONIDE 32 MCG/ACTUATION NASAL SPRAY: NASAL | 30 days supply | Qty: 8.43 | Fill #3

## 2021-09-17 MED FILL — ALBUTEROL SULFATE 2.5 MG/3 ML (0.083 %) SOLUTION FOR NEBULIZATION: 30 days supply | Qty: 360 | Fill #4

## 2021-09-30 MED FILL — ATORVASTATIN 20 MG TABLET: ORAL | 90 days supply | Qty: 90 | Fill #2

## 2021-10-01 MED FILL — LC PLUS MISC: 56 days supply | Qty: 1 | Fill #3

## 2021-10-02 DIAGNOSIS — J479 Bronchiectasis, uncomplicated: Principal | ICD-10-CM

## 2021-10-03 DIAGNOSIS — J479 Bronchiectasis, uncomplicated: Principal | ICD-10-CM

## 2021-10-07 DIAGNOSIS — J479 Bronchiectasis, uncomplicated: Principal | ICD-10-CM

## 2021-10-07 DIAGNOSIS — E119 Type 2 diabetes mellitus without complications: Principal | ICD-10-CM

## 2021-10-08 DIAGNOSIS — J479 Bronchiectasis, uncomplicated: Principal | ICD-10-CM

## 2021-10-16 MED FILL — AZITHROMYCIN 250 MG TABLET: ORAL | 30 days supply | Qty: 30 | Fill #9

## 2021-10-16 MED FILL — VENTOLIN HFA 90 MCG/ACTUATION AEROSOL INHALER: RESPIRATORY_TRACT | 51 days supply | Qty: 54 | Fill #1

## 2021-10-16 MED FILL — SULFAMETHOXAZOLE 400 MG-TRIMETHOPRIM 80 MG TABLET: ORAL | 90 days supply | Qty: 180 | Fill #3

## 2021-10-28 ENCOUNTER — Ambulatory Visit: Admit: 2021-10-28 | Discharge: 2021-10-29 | Payer: MEDICARE | Attending: Internal Medicine | Primary: Internal Medicine

## 2021-10-28 DIAGNOSIS — J479 Bronchiectasis, uncomplicated: Principal | ICD-10-CM

## 2021-11-04 MED FILL — OMEPRAZOLE 20 MG CAPSULE,DELAYED RELEASE: ORAL | 90 days supply | Qty: 180 | Fill #1

## 2021-11-05 MED ORDER — METFORMIN ER 500 MG TABLET,EXTENDED RELEASE 24 HR
ORAL_TABLET | Freq: Every day | ORAL | 1 refills | 90.00000 days | Status: CP
Start: 2021-11-05 — End: 2022-11-05
  Filled 2021-11-13: qty 360, 90d supply, fill #0

## 2021-11-13 MED FILL — BUDESONIDE 32 MCG/ACTUATION NASAL SPRAY: NASAL | 30 days supply | Qty: 8.43 | Fill #4

## 2021-11-13 MED FILL — AZITHROMYCIN 250 MG TABLET: ORAL | 30 days supply | Qty: 30 | Fill #10

## 2021-11-13 MED FILL — MONTELUKAST 10 MG TABLET: ORAL | 90 days supply | Qty: 90 | Fill #2

## 2021-11-13 MED FILL — ALBUTEROL SULFATE 2.5 MG/3 ML (0.083 %) SOLUTION FOR NEBULIZATION: 30 days supply | Qty: 360 | Fill #5

## 2021-11-28 DIAGNOSIS — J479 Bronchiectasis, uncomplicated: Principal | ICD-10-CM

## 2021-12-17 MED FILL — AZITHROMYCIN 250 MG TABLET: ORAL | 30 days supply | Qty: 30 | Fill #11

## 2021-12-17 MED FILL — BUDESONIDE 32 MCG/ACTUATION NASAL SPRAY: NASAL | 30 days supply | Qty: 8.43 | Fill #5

## 2021-12-23 ENCOUNTER — Ambulatory Visit: Admit: 2021-12-23 | Discharge: 2021-12-23 | Payer: MEDICARE | Attending: Internal Medicine | Primary: Internal Medicine

## 2021-12-23 ENCOUNTER — Ambulatory Visit: Admit: 2021-12-23 | Discharge: 2021-12-23 | Payer: MEDICARE

## 2021-12-23 DIAGNOSIS — Z148 Genetic carrier of other disease: Principal | ICD-10-CM

## 2021-12-23 DIAGNOSIS — J4550 Severe persistent asthma, uncomplicated: Principal | ICD-10-CM

## 2021-12-23 DIAGNOSIS — G4733 Obstructive sleep apnea (adult) (pediatric): Principal | ICD-10-CM

## 2021-12-23 DIAGNOSIS — J479 Bronchiectasis, uncomplicated: Principal | ICD-10-CM

## 2021-12-23 DIAGNOSIS — Z2239 Carrier of other specified bacterial diseases: Principal | ICD-10-CM

## 2021-12-24 DIAGNOSIS — J479 Bronchiectasis, uncomplicated: Principal | ICD-10-CM

## 2021-12-24 DIAGNOSIS — J471 Bronchiectasis with (acute) exacerbation: Principal | ICD-10-CM

## 2021-12-24 MED ORDER — ALBUTEROL SULFATE 2.5 MG/3 ML (0.083 %) SOLUTION FOR NEBULIZATION
5 refills | 0 days | Status: CP
Start: 2021-12-24 — End: ?
  Filled 2022-02-13: qty 360, 30d supply, fill #0

## 2021-12-24 MED ORDER — CETIRIZINE 10 MG TABLET
ORAL_TABLET | Freq: Every day | ORAL | 3 refills | 90 days | Status: CP
Start: 2021-12-24 — End: ?

## 2021-12-24 NOTE — Unmapped (Signed)
Specialty Medication(s): Tobramycin    Mr.Akard has been dis-enrolled from the [facility] specialty pharmacy services due to medication discontinuation due to high cost.    Additional information provided to the patient: I informed Mr [name] that he is disenrolled in our specialty pharmacy services and he will need to continue to call our pharmacy when other medications are due for refills. He verbalized understanding.     Oliva Bustard  [facility] Specialty Pharmacist

## 2021-12-25 ENCOUNTER — Ambulatory Visit: Admit: 2021-12-25 | Discharge: 2021-12-26 | Payer: MEDICARE

## 2021-12-25 DIAGNOSIS — J9611 Chronic respiratory failure with hypoxia: Principal | ICD-10-CM

## 2021-12-25 DIAGNOSIS — E119 Type 2 diabetes mellitus without complications: Principal | ICD-10-CM

## 2021-12-25 DIAGNOSIS — J479 Bronchiectasis, uncomplicated: Principal | ICD-10-CM

## 2021-12-25 DIAGNOSIS — K760 Fatty (change of) liver, not elsewhere classified: Principal | ICD-10-CM

## 2021-12-25 MED ORDER — CETIRIZINE 10 MG TABLET
ORAL_TABLET | Freq: Every day | ORAL | 3 refills | 90 days | Status: CP
Start: 2021-12-25 — End: ?
  Filled 2022-02-13: qty 90, 90d supply, fill #0

## 2021-12-29 MED FILL — ATORVASTATIN 20 MG TABLET: ORAL | 90 days supply | Qty: 90 | Fill #3

## 2021-12-30 MED ORDER — MUPIROCIN 2 % TOPICAL OINTMENT
Freq: Two times a day (BID) | TOPICAL | PRN refills | 0 days | Status: CP
Start: 2021-12-30 — End: 2022-01-06
  Filled 2022-01-01: qty 22, 7d supply, fill #0

## 2022-02-06 DIAGNOSIS — A498 Other bacterial infections of unspecified site: Principal | ICD-10-CM

## 2022-02-06 DIAGNOSIS — J479 Bronchiectasis, uncomplicated: Principal | ICD-10-CM

## 2022-02-13 MED FILL — METFORMIN ER 500 MG TABLET,EXTENDED RELEASE 24 HR: ORAL | 90 days supply | Qty: 360 | Fill #1

## 2022-02-13 MED FILL — MONTELUKAST 10 MG TABLET: ORAL | 90 days supply | Qty: 90 | Fill #3

## 2022-02-13 MED FILL — SODIUM CHLORIDE 7 % FOR NEBULIZATION: RESPIRATORY_TRACT | 90 days supply | Qty: 720 | Fill #2

## 2022-02-13 MED FILL — OMEPRAZOLE 20 MG CAPSULE,DELAYED RELEASE: ORAL | 90 days supply | Qty: 180 | Fill #2

## 2022-04-02 ENCOUNTER — Ambulatory Visit: Admit: 2022-04-02 | Discharge: 2022-04-03 | Payer: MEDICARE

## 2022-04-02 DIAGNOSIS — J479 Bronchiectasis, uncomplicated: Principal | ICD-10-CM

## 2022-04-02 DIAGNOSIS — E119 Type 2 diabetes mellitus without complications: Principal | ICD-10-CM

## 2022-04-02 MED ORDER — TRIAMCINOLONE ACETONIDE 0.1 % TOPICAL CREAM
Freq: Two times a day (BID) | TOPICAL | 0 refills | 0 days | Status: CP
Start: 2022-04-02 — End: 2023-04-02
  Filled 2022-04-06: qty 30, 15d supply, fill #0

## 2022-04-02 MED ORDER — ALBUTEROL SULFATE HFA 90 MCG/ACTUATION AEROSOL INHALER
RESPIRATORY_TRACT | 2 refills | 51 days | Status: CP | PRN
Start: 2022-04-02 — End: ?
  Filled 2022-05-18: qty 360, 30d supply, fill #1

## 2022-04-02 MED ORDER — ATORVASTATIN 20 MG TABLET
ORAL_TABLET | Freq: Every day | ORAL | 3 refills | 90 days | Status: CP
Start: 2022-04-02 — End: 2023-04-02
  Filled 2022-04-06: qty 90, 90d supply, fill #0

## 2022-04-03 ENCOUNTER — Ambulatory Visit: Admit: 2022-04-03 | Discharge: 2022-04-03 | Payer: MEDICARE | Attending: Internal Medicine | Primary: Internal Medicine

## 2022-04-03 ENCOUNTER — Ambulatory Visit: Admit: 2022-04-03 | Discharge: 2022-04-03 | Payer: MEDICARE | Attending: Registered" | Primary: Registered"

## 2022-04-03 DIAGNOSIS — J449 Chronic obstructive pulmonary disease, unspecified: Principal | ICD-10-CM

## 2022-04-03 DIAGNOSIS — J479 Bronchiectasis, uncomplicated: Principal | ICD-10-CM

## 2022-04-03 DIAGNOSIS — J4550 Severe persistent asthma, uncomplicated: Principal | ICD-10-CM

## 2022-04-03 DIAGNOSIS — Z148 Genetic carrier of other disease: Principal | ICD-10-CM

## 2022-04-03 DIAGNOSIS — G4733 Obstructive sleep apnea (adult) (pediatric): Principal | ICD-10-CM

## 2022-04-03 DIAGNOSIS — Z2239 Carrier of other specified bacterial diseases: Principal | ICD-10-CM

## 2022-04-07 ENCOUNTER — Ambulatory Visit: Admit: 2022-04-07 | Discharge: 2022-04-22 | Payer: MEDICARE

## 2022-04-07 ENCOUNTER — Ambulatory Visit: Admit: 2022-04-07 | Payer: MEDICARE

## 2022-04-07 ENCOUNTER — Ambulatory Visit
Admit: 2022-04-07 | Discharge: 2022-04-22 | Disposition: A | Discharge status: Home / Self Care | Payer: MEDICARE | Admitting: Medical

## 2022-04-15 MED ORDER — TRIAMCINOLONE ACETONIDE 0.1 % TOPICAL CREAM
Freq: Two times a day (BID) | TOPICAL | 0 refills | 0 days
Start: 2022-04-15 — End: 2023-04-15

## 2022-04-16 MED ORDER — TRIAMCINOLONE ACETONIDE 0.1 % TOPICAL CREAM
Freq: Two times a day (BID) | TOPICAL | 0 refills | 0 days
Start: 2022-04-16 — End: 2023-04-16

## 2022-04-20 DIAGNOSIS — J479 Bronchiectasis, uncomplicated: Principal | ICD-10-CM

## 2022-04-20 MED FILL — ATORVASTATIN 20 MG TABLET: ORAL | 90 days supply | Qty: 90 | Fill #1

## 2022-04-22 DIAGNOSIS — J4550 Severe persistent asthma, uncomplicated: Principal | ICD-10-CM

## 2022-04-22 DIAGNOSIS — J479 Bronchiectasis, uncomplicated: Principal | ICD-10-CM

## 2022-04-22 MED ORDER — BUDESONIDE 32 MCG/ACTUATION NASAL SPRAY
Freq: Every day | NASAL | 11 refills | 30 days | Status: CP
Start: 2022-04-22 — End: 2023-04-22
  Filled 2022-06-04: qty 8.43, 30d supply, fill #0

## 2022-04-22 MED ORDER — FLUTICASONE PROPIONATE 230 MCG-SALMETEROL 21 MCG/ACTUATION HFA INHALER
Freq: Two times a day (BID) | RESPIRATORY_TRACT | 3 refills | 90 days | Status: CP
Start: 2022-04-22 — End: ?

## 2022-04-23 DIAGNOSIS — Z09 Encounter for follow-up examination after completed treatment for conditions other than malignant neoplasm: Principal | ICD-10-CM

## 2022-04-24 DIAGNOSIS — J4550 Severe persistent asthma, uncomplicated: Principal | ICD-10-CM

## 2022-04-24 DIAGNOSIS — J471 Bronchiectasis with (acute) exacerbation: Principal | ICD-10-CM

## 2022-04-24 MED ORDER — FLUTICASONE 500 MCG-SALMETEROL 50 MCG/DOSE BLISTR POWDR FOR INHALATION
Freq: Two times a day (BID) | RESPIRATORY_TRACT | 11 refills | 30 days | Status: CP
Start: 2022-04-24 — End: 2023-04-24

## 2022-05-01 ENCOUNTER — Ambulatory Visit
Admit: 2022-05-01 | Discharge: 2022-05-02 | Payer: MEDICARE | Attending: Student in an Organized Health Care Education/Training Program | Primary: Student in an Organized Health Care Education/Training Program

## 2022-05-01 DIAGNOSIS — K2289 Dilatation of esophagus: Principal | ICD-10-CM

## 2022-05-01 DIAGNOSIS — J471 Bronchiectasis with (acute) exacerbation: Principal | ICD-10-CM

## 2022-05-01 DIAGNOSIS — Z09 Encounter for follow-up examination after completed treatment for conditions other than malignant neoplasm: Principal | ICD-10-CM

## 2022-05-13 DIAGNOSIS — J4550 Severe persistent asthma, uncomplicated: Principal | ICD-10-CM

## 2022-05-13 DIAGNOSIS — J479 Bronchiectasis, uncomplicated: Principal | ICD-10-CM

## 2022-05-13 MED ORDER — FEXOFENADINE 180 MG TABLET
ORAL_TABLET | Freq: Every day | ORAL | 1 refills | 90 days | Status: CP
Start: 2022-05-13 — End: 2023-01-12
  Filled 2022-05-18: qty 90, 90d supply, fill #0

## 2022-05-13 MED ORDER — MONTELUKAST 10 MG TABLET
ORAL_TABLET | Freq: Every evening | ORAL | 3 refills | 90 days | Status: CP
Start: 2022-05-13 — End: ?
  Filled 2022-05-18: qty 90, 90d supply, fill #0

## 2022-05-13 MED ORDER — NEBULIZERS
5 refills | 0 days | Status: CP
Start: 2022-05-13 — End: ?

## 2022-05-13 MED ORDER — SODIUM CHLORIDE 7 % FOR NEBULIZATION
Freq: Two times a day (BID) | RESPIRATORY_TRACT | 3 refills | 90 days | Status: CP
Start: 2022-05-13 — End: 2023-05-13
  Filled 2022-05-18: qty 720, 90d supply, fill #0

## 2022-05-18 MED FILL — LC PLUS MISC: 30 days supply | Qty: 1 | Fill #0

## 2022-06-02 MED ORDER — METFORMIN ER 500 MG TABLET,EXTENDED RELEASE 24 HR
ORAL_TABLET | Freq: Every day | ORAL | 3 refills | 90.00000 days | Status: CP
Start: 2022-06-02 — End: 2023-06-02
  Filled 2022-06-08: qty 360, 90d supply, fill #0

## 2022-06-04 MED FILL — OMEPRAZOLE 20 MG CAPSULE,DELAYED RELEASE: ORAL | 90 days supply | Qty: 180 | Fill #3

## 2022-06-15 DIAGNOSIS — J479 Bronchiectasis, uncomplicated: Principal | ICD-10-CM

## 2022-06-15 DIAGNOSIS — G4733 Obstructive sleep apnea (adult) (pediatric): Principal | ICD-10-CM

## 2022-06-18 ENCOUNTER — Ambulatory Visit: Admit: 2022-06-18 | Discharge: 2022-06-19 | Payer: MEDICARE

## 2022-07-31 DIAGNOSIS — J4550 Severe persistent asthma, uncomplicated: Principal | ICD-10-CM

## 2022-07-31 MED ORDER — FLUTICASONE FUROATE 200 MCG-VILANTEROL 25 MCG/DOSE INHALATION POWDER
Freq: Every day | RESPIRATORY_TRACT | 11 refills | 30 days | Status: CP
Start: 2022-07-31 — End: ?

## 2022-08-06 MED FILL — CETIRIZINE 10 MG TABLET: ORAL | 90 days supply | Qty: 90 | Fill #1

## 2022-08-06 MED FILL — BUDESONIDE 32 MCG/ACTUATION NASAL SPRAY: NASAL | 30 days supply | Qty: 8.43 | Fill #1

## 2022-08-13 DIAGNOSIS — H6090 Unspecified otitis externa, unspecified ear: Principal | ICD-10-CM

## 2022-08-14 DIAGNOSIS — H9190 Unspecified hearing loss, unspecified ear: Principal | ICD-10-CM

## 2022-08-17 ENCOUNTER — Institutional Professional Consult (permissible substitution): Admit: 2022-08-17 | Discharge: 2022-08-18 | Payer: MEDICARE | Attending: Audiologist | Primary: Audiologist

## 2022-08-17 ENCOUNTER — Ambulatory Visit: Admit: 2022-08-17 | Discharge: 2022-08-18 | Payer: MEDICARE

## 2022-08-17 DIAGNOSIS — Z148 Genetic carrier of other disease: Principal | ICD-10-CM

## 2022-08-17 DIAGNOSIS — J479 Bronchiectasis, uncomplicated: Principal | ICD-10-CM

## 2022-08-18 ENCOUNTER — Ambulatory Visit: Admit: 2022-08-18 | Discharge: 2022-08-18 | Payer: MEDICARE

## 2022-08-18 ENCOUNTER — Ambulatory Visit: Admit: 2022-08-18 | Discharge: 2022-08-18 | Payer: MEDICARE | Attending: Internal Medicine | Primary: Internal Medicine

## 2022-08-18 DIAGNOSIS — J479 Bronchiectasis, uncomplicated: Principal | ICD-10-CM

## 2022-08-18 DIAGNOSIS — J4550 Severe persistent asthma, uncomplicated: Principal | ICD-10-CM

## 2022-08-18 DIAGNOSIS — K224 Dyskinesia of esophagus: Principal | ICD-10-CM

## 2022-08-18 DIAGNOSIS — Z148 Genetic carrier of other disease: Principal | ICD-10-CM

## 2022-08-18 DIAGNOSIS — Z23 Encounter for immunization: Principal | ICD-10-CM

## 2022-09-02 MED ORDER — OMEPRAZOLE 20 MG CAPSULE,DELAYED RELEASE
ORAL_CAPSULE | Freq: Two times a day (BID) | ORAL | 3 refills | 90 days | Status: CP
Start: 2022-09-02 — End: 2023-09-02
  Filled 2022-09-04: qty 180, 90d supply, fill #0

## 2022-09-04 MED FILL — MONTELUKAST 10 MG TABLET: ORAL | 90 days supply | Qty: 90 | Fill #1

## 2022-09-04 MED FILL — METFORMIN ER 500 MG TABLET,EXTENDED RELEASE 24 HR: ORAL | 90 days supply | Qty: 360 | Fill #1

## 2022-09-16 DIAGNOSIS — J479 Bronchiectasis, uncomplicated: Principal | ICD-10-CM

## 2022-09-16 MED ORDER — SPIRIVA RESPIMAT 2.5 MCG/ACTUATION SOLUTION FOR INHALATION
Freq: Every day | RESPIRATORY_TRACT | 11 refills | 0 days | Status: CP
Start: 2022-09-16 — End: ?

## 2022-09-17 DIAGNOSIS — J479 Bronchiectasis, uncomplicated: Principal | ICD-10-CM

## 2022-09-17 DIAGNOSIS — A498 Other bacterial infections of unspecified site: Principal | ICD-10-CM

## 2022-09-17 MED ORDER — CAYSTON 75 MG/ML SOLUTION FOR NEBULIZATION
Freq: Three times a day (TID) | RESPIRATORY_TRACT | 3 refills | 28 days | Status: CP
Start: 2022-09-17 — End: 2023-09-17

## 2022-09-19 MED ORDER — EMPAGLIFLOZIN 25 MG TABLET
ORAL_TABLET | Freq: Every day | ORAL | 3 refills | 90 days | Status: CP
Start: 2022-09-19 — End: ?

## 2022-09-25 DIAGNOSIS — J479 Bronchiectasis, uncomplicated: Principal | ICD-10-CM

## 2022-10-02 DIAGNOSIS — E119 Type 2 diabetes mellitus without complications: Principal | ICD-10-CM

## 2022-10-02 DIAGNOSIS — J479 Bronchiectasis, uncomplicated: Principal | ICD-10-CM

## 2022-10-02 DIAGNOSIS — Z2239 Carrier of other specified bacterial diseases: Principal | ICD-10-CM

## 2022-10-02 MED FILL — FEXOFENADINE 180 MG TABLET: ORAL | 90 days supply | Qty: 90 | Fill #1

## 2022-10-02 MED FILL — BUDESONIDE 32 MCG/ACTUATION NASAL SPRAY: NASAL | 30 days supply | Qty: 8.43 | Fill #2

## 2022-10-14 MED FILL — ALBUTEROL SULFATE 2.5 MG/3 ML (0.083 %) SOLUTION FOR NEBULIZATION: 30 days supply | Qty: 360 | Fill #2

## 2022-10-14 MED FILL — SODIUM CHLORIDE 7 % FOR NEBULIZATION: RESPIRATORY_TRACT | 90 days supply | Qty: 720 | Fill #1

## 2022-10-22 DIAGNOSIS — A498 Other bacterial infections of unspecified site: Principal | ICD-10-CM

## 2022-10-22 DIAGNOSIS — J479 Bronchiectasis, uncomplicated: Principal | ICD-10-CM

## 2022-10-22 MED ORDER — CAYSTON 75 MG/ML SOLUTION FOR NEBULIZATION
Freq: Three times a day (TID) | RESPIRATORY_TRACT | 3 refills | 28 days | Status: CP
Start: 2022-10-22 — End: 2023-10-22

## 2022-10-28 DIAGNOSIS — E119 Type 2 diabetes mellitus without complications: Principal | ICD-10-CM

## 2022-10-28 DIAGNOSIS — J479 Bronchiectasis, uncomplicated: Principal | ICD-10-CM

## 2022-11-11 DIAGNOSIS — J479 Bronchiectasis, uncomplicated: Principal | ICD-10-CM

## 2022-11-11 DIAGNOSIS — A498 Other bacterial infections of unspecified site: Principal | ICD-10-CM

## 2022-11-17 ENCOUNTER — Ambulatory Visit: Admit: 2022-11-17 | Discharge: 2022-11-18 | Payer: MEDICARE

## 2022-11-17 DIAGNOSIS — Z7689 Persons encountering health services in other specified circumstances: Principal | ICD-10-CM

## 2022-11-23 ENCOUNTER — Ambulatory Visit: Admit: 2022-11-23 | Discharge: 2022-11-23 | Payer: MEDICARE

## 2022-11-23 DIAGNOSIS — J479 Bronchiectasis, uncomplicated: Principal | ICD-10-CM

## 2022-11-23 MED ORDER — NEOMYCIN-POLYMYXIN-HYDROCORT 3.5 MG/ML-10,000 UNIT/ML-1 % EAR SOLUTION
Freq: Two times a day (BID) | OTIC | 0 refills | 5 days | Status: CP
Start: 2022-11-23 — End: 2022-11-28

## 2022-11-27 DIAGNOSIS — J479 Bronchiectasis, uncomplicated: Principal | ICD-10-CM

## 2022-11-27 DIAGNOSIS — J4550 Severe persistent asthma, uncomplicated: Principal | ICD-10-CM

## 2022-11-27 MED ORDER — MONTELUKAST 10 MG TABLET
ORAL_TABLET | Freq: Every evening | ORAL | 3 refills | 90 days | Status: CP
Start: 2022-11-27 — End: ?
  Filled 2022-12-02: qty 90, 90d supply, fill #0

## 2022-11-27 MED ORDER — OMEPRAZOLE 20 MG CAPSULE,DELAYED RELEASE
ORAL_CAPSULE | Freq: Two times a day (BID) | ORAL | 3 refills | 90 days | Status: CP
Start: 2022-11-27 — End: 2023-11-27
  Filled 2022-12-02: qty 180, 90d supply, fill #0

## 2022-11-27 MED ORDER — CETIRIZINE 10 MG TABLET
ORAL_TABLET | Freq: Every day | ORAL | 3 refills | 90 days | Status: CP
Start: 2022-11-27 — End: ?
  Filled 2022-12-02: qty 90, 90d supply, fill #0

## 2022-11-27 MED ORDER — BUDESONIDE 32 MCG/ACTUATION NASAL SPRAY
Freq: Every day | NASAL | 11 refills | 30 days | Status: CP
Start: 2022-11-27 — End: 2023-11-27
  Filled 2022-12-02: qty 8.43, 30d supply, fill #0

## 2022-11-27 MED ORDER — METFORMIN ER 500 MG TABLET,EXTENDED RELEASE 24 HR
ORAL_TABLET | Freq: Every day | ORAL | 3 refills | 90 days | Status: CP
Start: 2022-11-27 — End: 2023-11-27
  Filled 2022-12-02: qty 360, 90d supply, fill #0

## 2023-01-05 ENCOUNTER — Telehealth: Admit: 2023-01-05 | Discharge: 2023-01-05 | Payer: MEDICARE

## 2023-01-05 ENCOUNTER — Ambulatory Visit: Admit: 2023-01-05 | Discharge: 2023-01-05 | Payer: MEDICARE

## 2023-01-05 ENCOUNTER — Telehealth
Admit: 2023-01-05 | Discharge: 2023-01-05 | Payer: MEDICARE | Attending: Nurse Practitioner | Primary: Nurse Practitioner

## 2023-01-05 DIAGNOSIS — E8801 Alpha-1-antitrypsin deficiency: Principal | ICD-10-CM

## 2023-01-05 DIAGNOSIS — Z800 Family history of malignant neoplasm of digestive organs: Principal | ICD-10-CM

## 2023-01-05 DIAGNOSIS — E119 Type 2 diabetes mellitus without complications: Principal | ICD-10-CM

## 2023-01-05 DIAGNOSIS — J45909 Unspecified asthma, uncomplicated: Principal | ICD-10-CM

## 2023-01-05 DIAGNOSIS — G4733 Obstructive sleep apnea (adult) (pediatric): Principal | ICD-10-CM

## 2023-01-05 DIAGNOSIS — Z86010 Personal history of colonic polyps: Principal | ICD-10-CM

## 2023-01-05 DIAGNOSIS — J479 Bronchiectasis, uncomplicated: Principal | ICD-10-CM

## 2023-01-05 DIAGNOSIS — K224 Dyskinesia of esophagus: Principal | ICD-10-CM

## 2023-01-05 DIAGNOSIS — Z01818 Encounter for other preprocedural examination: Principal | ICD-10-CM

## 2023-01-07 DIAGNOSIS — J479 Bronchiectasis, uncomplicated: Principal | ICD-10-CM

## 2023-01-07 MED ORDER — SPIRIVA RESPIMAT 2.5 MCG/ACTUATION SOLUTION FOR INHALATION
Freq: Every day | RESPIRATORY_TRACT | 11 refills | 0 days | Status: CP
Start: 2023-01-07 — End: ?

## 2023-01-13 MED ORDER — FEXOFENADINE 180 MG TABLET
ORAL_TABLET | Freq: Every day | ORAL | 1 refills | 90 days | Status: CP
Start: 2023-01-13 — End: 2023-09-14
  Filled 2023-01-20: qty 90, 90d supply, fill #0

## 2023-01-15 MED FILL — BUDESONIDE 32 MCG/ACTUATION NASAL SPRAY: NASAL | 30 days supply | Qty: 8.43 | Fill #1

## 2023-01-15 MED FILL — ATORVASTATIN 20 MG TABLET: ORAL | 90 days supply | Qty: 90 | Fill #2

## 2023-02-01 ENCOUNTER — Institutional Professional Consult (permissible substitution): Admit: 2023-02-01 | Discharge: 2023-02-01 | Payer: MEDICARE

## 2023-02-01 ENCOUNTER — Ambulatory Visit: Admit: 2023-02-01 | Discharge: 2023-02-01 | Payer: MEDICARE

## 2023-02-01 DIAGNOSIS — H906 Mixed conductive and sensorineural hearing loss, bilateral: Principal | ICD-10-CM

## 2023-02-01 DIAGNOSIS — H65491 Other chronic nonsuppurative otitis media, right ear: Principal | ICD-10-CM

## 2023-02-03 DIAGNOSIS — J479 Bronchiectasis, uncomplicated: Principal | ICD-10-CM

## 2023-02-03 MED ORDER — CIPROFLOXACIN 750 MG TABLET
ORAL_TABLET | Freq: Two times a day (BID) | ORAL | 0 refills | 14 days | Status: CP
Start: 2023-02-03 — End: ?

## 2023-02-03 MED ORDER — AZITHROMYCIN 250 MG TABLET
ORAL_TABLET | Freq: Every day | ORAL | 3 refills | 90 days | Status: CP
Start: 2023-02-03 — End: 2024-02-03
  Filled 2023-02-15: qty 90, 90d supply, fill #0

## 2023-02-15 MED FILL — SODIUM CHLORIDE 7 % FOR NEBULIZATION: RESPIRATORY_TRACT | 90 days supply | Qty: 720 | Fill #2

## 2023-02-22 ENCOUNTER — Encounter: Admit: 2023-02-22 | Discharge: 2023-02-22 | Payer: MEDICARE | Attending: Pulmonary Disease | Primary: Pulmonary Disease

## 2023-02-22 ENCOUNTER — Ambulatory Visit: Admit: 2023-02-22 | Discharge: 2023-02-22 | Payer: MEDICARE | Attending: Pulmonary Disease | Primary: Pulmonary Disease

## 2023-02-22 ENCOUNTER — Ambulatory Visit: Admit: 2023-02-22 | Discharge: 2023-02-22 | Payer: MEDICARE

## 2023-02-22 DIAGNOSIS — Z7689 Persons encountering health services in other specified circumstances: Principal | ICD-10-CM

## 2023-02-22 DIAGNOSIS — Z2239 Carrier of other specified bacterial diseases: Principal | ICD-10-CM

## 2023-02-22 DIAGNOSIS — G4733 Obstructive sleep apnea (adult) (pediatric): Principal | ICD-10-CM

## 2023-02-22 DIAGNOSIS — J479 Bronchiectasis, uncomplicated: Principal | ICD-10-CM

## 2023-02-24 ENCOUNTER — Ambulatory Visit: Admit: 2023-02-24 | Discharge: 2023-02-25 | Payer: MEDICARE

## 2023-02-24 DIAGNOSIS — E119 Type 2 diabetes mellitus without complications: Principal | ICD-10-CM

## 2023-02-24 DIAGNOSIS — H25813 Combined forms of age-related cataract, bilateral: Principal | ICD-10-CM

## 2023-02-25 ENCOUNTER — Institutional Professional Consult (permissible substitution): Admit: 2023-02-25 | Discharge: 2023-02-26 | Payer: MEDICARE | Attending: Audiologist | Primary: Audiologist

## 2023-02-25 DIAGNOSIS — H906 Mixed conductive and sensorineural hearing loss, bilateral: Principal | ICD-10-CM

## 2023-03-02 ENCOUNTER — Ambulatory Visit: Admit: 2023-03-02 | Discharge: 2023-03-03 | Payer: MEDICARE

## 2023-03-02 ENCOUNTER — Encounter: Admit: 2023-03-02 | Discharge: 2023-03-03 | Payer: MEDICARE | Attending: Pulmonary Disease | Primary: Pulmonary Disease

## 2023-03-03 MED ORDER — OFLOXACIN 0.3 % EAR DROPS
Freq: Two times a day (BID) | OTIC | 0 refills | 13 days | Status: CP
Start: 2023-03-03 — End: 2023-03-06

## 2023-03-04 DIAGNOSIS — H65492 Other chronic nonsuppurative otitis media, left ear: Principal | ICD-10-CM

## 2023-03-04 MED ORDER — NEOMYCIN-POLYMYXIN-HYDROCORT 3.5 MG/ML-10,000 UNIT/ML-1 % EAR SOLUTION
Freq: Two times a day (BID) | OTIC | 1 refills | 0 days | Status: CP
Start: 2023-03-04 — End: 2023-03-04

## 2023-03-05 MED ORDER — PEG 3350-ELECTROLYTES 236 GRAM-22.74 GRAM-6.74 GRAM-5.86 GRAM SOLUTION
0 refills | 0 days | Status: CP
Start: 2023-03-05 — End: ?

## 2023-03-05 MED FILL — MONTELUKAST 10 MG TABLET: ORAL | 90 days supply | Qty: 90 | Fill #1

## 2023-03-05 MED FILL — BUDESONIDE 32 MCG/ACTUATION NASAL SPRAY: NASAL | 30 days supply | Qty: 8.43 | Fill #2

## 2023-03-05 MED FILL — METFORMIN ER 500 MG TABLET,EXTENDED RELEASE 24 HR: ORAL | 90 days supply | Qty: 360 | Fill #1

## 2023-03-05 MED FILL — VENTOLIN HFA 90 MCG/ACTUATION AEROSOL INHALER: RESPIRATORY_TRACT | 51 days supply | Qty: 54 | Fill #0

## 2023-03-23 MED FILL — CETIRIZINE 10 MG TABLET: ORAL | 90 days supply | Qty: 90 | Fill #1

## 2023-03-23 MED FILL — OMEPRAZOLE 20 MG CAPSULE,DELAYED RELEASE: ORAL | 90 days supply | Qty: 180 | Fill #1

## 2023-04-07 DIAGNOSIS — J471 Bronchiectasis with (acute) exacerbation: Principal | ICD-10-CM

## 2023-04-07 MED ORDER — ALBUTEROL SULFATE 2.5 MG/3 ML (0.083 %) SOLUTION FOR NEBULIZATION
5 refills | 0 days | Status: CP
Start: 2023-04-07 — End: ?
  Filled 2023-04-09: qty 360, 30d supply, fill #0

## 2023-04-07 MED ORDER — ATORVASTATIN 20 MG TABLET
ORAL_TABLET | Freq: Every day | ORAL | 3 refills | 90 days | Status: CP
Start: 2023-04-07 — End: 2024-04-06
  Filled 2023-04-09: qty 90, 90d supply, fill #0

## 2023-04-09 ENCOUNTER — Ambulatory Visit: Admit: 2023-04-09 | Payer: MEDICARE

## 2023-04-09 ENCOUNTER — Ambulatory Visit: Admit: 2023-04-09 | Discharge: 2023-04-22 | Disposition: A | Discharge status: Home / Self Care | Payer: MEDICARE

## 2023-04-22 MED ORDER — HYDROCORTISONE 2.5 % TOPICAL OINTMENT
Freq: Two times a day (BID) | TOPICAL | 0 refills | 0 days | Status: CP
Start: 2023-04-22 — End: 2024-04-21

## 2023-04-22 MED ORDER — ARFORMOTEROL 15 MCG/2 ML SOLUTION FOR NEBULIZATION
Freq: Two times a day (BID) | RESPIRATORY_TRACT | 0 refills | 90 days | Status: CP
Start: 2023-04-22 — End: 2023-07-21

## 2023-04-22 MED ORDER — TRIAMCINOLONE ACETONIDE 0.1 % TOPICAL CREAM
Freq: Two times a day (BID) | TOPICAL | 0 refills | 0 days | Status: CP
Start: 2023-04-22 — End: 2024-04-21

## 2023-04-22 MED ORDER — CARBOXYMETHYLCELLULOSE SODIUM 0.25 % EYE DROPS
Freq: Three times a day (TID) | OPHTHALMIC | 0 refills | 30 days | Status: CP
Start: 2023-04-22 — End: 2023-05-22

## 2023-04-22 MED ORDER — BUDESONIDE 0.5 MG/2 ML SUSPENSION FOR NEBULIZATION
Freq: Two times a day (BID) | RESPIRATORY_TRACT | 11 refills | 30 days | Status: CP
Start: 2023-04-22 — End: 2024-04-21

## 2023-04-22 MED ORDER — HYDROXYZINE HCL 25 MG TABLET
ORAL_TABLET | Freq: Four times a day (QID) | ORAL | 0 refills | 7 days | Status: CP | PRN
Start: 2023-04-22 — End: 2023-04-29

## 2023-04-23 DIAGNOSIS — Z09 Encounter for follow-up examination after completed treatment for conditions other than malignant neoplasm: Principal | ICD-10-CM

## 2023-04-26 MED ORDER — ARFORMOTEROL 15 MCG/2 ML SOLUTION FOR NEBULIZATION
Freq: Two times a day (BID) | RESPIRATORY_TRACT | 0 refills | 90 days
Start: 2023-04-26 — End: 2023-07-25

## 2023-04-26 MED ORDER — BUDESONIDE 0.5 MG/2 ML SUSPENSION FOR NEBULIZATION
Freq: Two times a day (BID) | RESPIRATORY_TRACT | 11 refills | 30 days
Start: 2023-04-26 — End: 2024-04-25

## 2023-04-28 ENCOUNTER — Ambulatory Visit: Admit: 2023-04-28 | Discharge: 2023-04-29 | Payer: MEDICARE

## 2023-04-28 DIAGNOSIS — H65493 Other chronic nonsuppurative otitis media, bilateral: Principal | ICD-10-CM

## 2023-04-28 DIAGNOSIS — H906 Mixed conductive and sensorineural hearing loss, bilateral: Principal | ICD-10-CM

## 2023-04-28 DIAGNOSIS — H938X3 Other specified disorders of ear, bilateral: Principal | ICD-10-CM

## 2023-05-04 ENCOUNTER — Ambulatory Visit: Admit: 2023-05-04 | Discharge: 2023-05-04 | Payer: MEDICARE

## 2023-05-04 DIAGNOSIS — E119 Type 2 diabetes mellitus without complications: Principal | ICD-10-CM

## 2023-05-04 DIAGNOSIS — E785 Hyperlipidemia, unspecified: Principal | ICD-10-CM

## 2023-05-04 MED ORDER — BLOOD GLUCOSE TEST STRIPS
ORAL_STRIP | 11 refills | 0 days
Start: 2023-05-04 — End: 2024-05-03

## 2023-05-04 MED ORDER — CARBOXYMETHYLCELLULOSE SODIUM 0.25 % EYE DROPS
Freq: Three times a day (TID) | OPHTHALMIC | 0 refills | 30 days
Start: 2023-05-04 — End: 2023-06-03

## 2023-05-04 MED ORDER — ARFORMOTEROL 15 MCG/2 ML SOLUTION FOR NEBULIZATION
Freq: Two times a day (BID) | RESPIRATORY_TRACT | 3 refills | 90 days | Status: CP
Start: 2023-05-04 — End: ?
  Filled 2023-05-10: qty 360, 90d supply, fill #0

## 2023-05-04 MED ORDER — BUDESONIDE 0.5 MG/2 ML SUSPENSION FOR NEBULIZATION
Freq: Two times a day (BID) | RESPIRATORY_TRACT | 3 refills | 90 days | Status: CP
Start: 2023-05-04 — End: ?

## 2023-05-04 MED ORDER — LANCETS
Freq: Every day | 3 refills | 0 days
Start: 2023-05-04 — End: ?

## 2023-05-05 MED ORDER — LANCETS
Freq: Every day | 3 refills | 0 days | Status: CP
Start: 2023-05-05 — End: ?
  Filled 2023-05-10: qty 100, 100d supply, fill #0

## 2023-05-05 MED ORDER — CARBOXYMETHYLCELLULOSE SODIUM 0.25 % EYE DROPS
Freq: Three times a day (TID) | OPHTHALMIC | 0 refills | 50 days | Status: CP
Start: 2023-05-05 — End: 2023-06-04
  Filled 2023-05-10: qty 15, 25d supply, fill #0

## 2023-05-05 MED ORDER — BLOOD GLUCOSE TEST STRIPS
ORAL_STRIP | 11 refills | 0 days | Status: CP
Start: 2023-05-05 — End: 2024-05-03
  Filled 2023-05-10: qty 50, 50d supply, fill #0

## 2023-05-10 MED FILL — AZITHROMYCIN 250 MG TABLET: ORAL | 90 days supply | Qty: 90 | Fill #1

## 2023-05-12 DIAGNOSIS — J4550 Severe persistent asthma, uncomplicated: Principal | ICD-10-CM

## 2023-05-12 MED ORDER — BREZTRI AEROSPHERE 160 MCG-9MCG-4.8MCG/ACTUATION HFA AEROSOL INHALER
Freq: Two times a day (BID) | RESPIRATORY_TRACT | 3 refills | 0 days | Status: CP
Start: 2023-05-12 — End: ?

## 2023-05-13 DIAGNOSIS — J4550 Severe persistent asthma, uncomplicated: Principal | ICD-10-CM

## 2023-05-19 DIAGNOSIS — J4550 Severe persistent asthma, uncomplicated: Principal | ICD-10-CM

## 2023-05-19 MED ORDER — BREZTRI AEROSPHERE 160 MCG-9MCG-4.8MCG/ACTUATION HFA AEROSOL INHALER
Freq: Two times a day (BID) | RESPIRATORY_TRACT | 3 refills | 0 days | Status: CP
Start: 2023-05-19 — End: ?

## 2023-06-01 DIAGNOSIS — H2511 Age-related nuclear cataract, right eye: Principal | ICD-10-CM

## 2023-06-01 DIAGNOSIS — H25813 Combined forms of age-related cataract, bilateral: Principal | ICD-10-CM

## 2023-06-03 ENCOUNTER — Encounter: Admit: 2023-06-03 | Discharge: 2023-06-03 | Payer: MEDICARE

## 2023-06-03 ENCOUNTER — Ambulatory Visit: Admit: 2023-06-03 | Discharge: 2023-06-03 | Payer: MEDICARE

## 2023-06-04 ENCOUNTER — Telehealth: Admit: 2023-06-04 | Discharge: 2023-06-05 | Payer: MEDICARE

## 2023-06-04 DIAGNOSIS — H25813 Combined forms of age-related cataract, bilateral: Principal | ICD-10-CM

## 2023-06-08 ENCOUNTER — Ambulatory Visit: Admit: 2023-06-08 | Discharge: 2023-06-08 | Payer: MEDICARE

## 2023-06-08 ENCOUNTER — Encounter
Admit: 2023-06-08 | Discharge: 2023-06-08 | Payer: MEDICARE | Attending: Student in an Organized Health Care Education/Training Program | Primary: Student in an Organized Health Care Education/Training Program

## 2023-06-08 MED ORDER — PREDNISOLONE ACETATE 1 % EYE DROPS,SUSPENSION
Freq: Four times a day (QID) | OPHTHALMIC | 0 refills | 50.00000 days | Status: CP
Start: 2023-06-08 — End: ?

## 2023-06-08 MED ORDER — MOXIFLOXACIN 0.5 % EYE DROPS
Freq: Four times a day (QID) | OPHTHALMIC | 0 refills | 15.00000 days | Status: CP
Start: 2023-06-08 — End: ?

## 2023-06-09 ENCOUNTER — Ambulatory Visit: Admit: 2023-06-09 | Discharge: 2023-06-10 | Payer: MEDICARE | Attending: Pharmacist | Primary: Pharmacist

## 2023-06-09 DIAGNOSIS — Z961 Presence of intraocular lens: Principal | ICD-10-CM

## 2023-06-09 DIAGNOSIS — Z9841 Cataract extraction status, right eye: Principal | ICD-10-CM

## 2023-06-22 DIAGNOSIS — J479 Bronchiectasis, uncomplicated: Principal | ICD-10-CM

## 2023-06-22 DIAGNOSIS — A498 Other bacterial infections of unspecified site: Principal | ICD-10-CM

## 2023-06-22 MED ORDER — NEBULIZERS
0 refills | 0 days | Status: CP
Start: 2023-06-22 — End: ?

## 2023-06-22 MED ORDER — CAYSTON 75 MG/ML SOLUTION FOR NEBULIZATION
Freq: Three times a day (TID) | RESPIRATORY_TRACT | 3 refills | 28 days | Status: CP
Start: 2023-06-22 — End: 2024-06-21

## 2023-06-23 ENCOUNTER — Ambulatory Visit: Admit: 2023-06-23 | Discharge: 2023-06-24 | Payer: MEDICARE | Attending: Pharmacist | Primary: Pharmacist

## 2023-06-23 DIAGNOSIS — Z961 Presence of intraocular lens: Principal | ICD-10-CM

## 2023-06-23 DIAGNOSIS — Z9841 Cataract extraction status, right eye: Principal | ICD-10-CM

## 2023-07-07 ENCOUNTER — Ambulatory Visit: Admit: 2023-07-07 | Discharge: 2023-07-08 | Payer: MEDICARE | Attending: Pharmacist | Primary: Pharmacist

## 2023-07-07 DIAGNOSIS — H3589 Other specified retinal disorders: Principal | ICD-10-CM

## 2023-07-07 DIAGNOSIS — E119 Type 2 diabetes mellitus without complications: Principal | ICD-10-CM

## 2023-07-14 ENCOUNTER — Ambulatory Visit: Admit: 2023-07-14 | Discharge: 2023-07-15 | Payer: MEDICARE | Attending: Pulmonary Disease | Primary: Pulmonary Disease

## 2023-07-14 DIAGNOSIS — J479 Bronchiectasis, uncomplicated: Principal | ICD-10-CM

## 2023-07-14 DIAGNOSIS — J449 Chronic obstructive pulmonary disease, unspecified: Principal | ICD-10-CM

## 2023-07-14 MED FILL — MONTELUKAST 10 MG TABLET: ORAL | 90 days supply | Qty: 90 | Fill #2

## 2023-07-14 MED FILL — OMEPRAZOLE 20 MG CAPSULE,DELAYED RELEASE: ORAL | 90 days supply | Qty: 180 | Fill #2

## 2023-08-03 DIAGNOSIS — J479 Bronchiectasis, uncomplicated: Principal | ICD-10-CM

## 2023-08-03 MED ORDER — SODIUM CHLORIDE 7 % FOR NEBULIZATION
Freq: Two times a day (BID) | RESPIRATORY_TRACT | 3 refills | 90 days
Start: 2023-08-03 — End: 2024-08-02

## 2023-08-09 MED ORDER — SODIUM CHLORIDE 7 % FOR NEBULIZATION
Freq: Two times a day (BID) | RESPIRATORY_TRACT | 3 refills | 90 days | Status: CP
Start: 2023-08-09 — End: 2024-08-08
  Filled 2023-08-12: qty 720, 90d supply, fill #0

## 2023-08-12 MED FILL — METFORMIN ER 500 MG TABLET,EXTENDED RELEASE 24 HR: ORAL | 90 days supply | Qty: 360 | Fill #2

## 2023-08-12 MED FILL — ALBUTEROL SULFATE 2.5 MG/3 ML (0.083 %) SOLUTION FOR NEBULIZATION: 30 days supply | Qty: 360 | Fill #1

## 2023-08-12 MED FILL — ATORVASTATIN 20 MG TABLET: ORAL | 90 days supply | Qty: 90 | Fill #1

## 2023-09-01 MED ORDER — EMPAGLIFLOZIN 25 MG TABLET
ORAL_TABLET | Freq: Every day | ORAL | 3 refills | 90.00000 days | Status: CP
Start: 2023-09-01 — End: 2023-09-01

## 2023-09-03 DIAGNOSIS — A498 Other bacterial infections of unspecified site: Principal | ICD-10-CM

## 2023-09-03 DIAGNOSIS — J4550 Severe persistent asthma, uncomplicated: Principal | ICD-10-CM

## 2023-09-03 DIAGNOSIS — J479 Bronchiectasis, uncomplicated: Principal | ICD-10-CM

## 2023-09-03 MED ORDER — CAYSTON 75 MG/ML SOLUTION FOR NEBULIZATION
Freq: Three times a day (TID) | RESPIRATORY_TRACT | 3 refills | 28 days | Status: CP
Start: 2023-09-03 — End: 2024-09-02

## 2023-09-03 MED ORDER — BREZTRI AEROSPHERE 160 MCG-9MCG-4.8MCG/ACTUATION HFA AEROSOL INHALER
Freq: Two times a day (BID) | RESPIRATORY_TRACT | 3 refills | 0 days | Status: CP
Start: 2023-09-03 — End: ?

## 2023-09-07 ENCOUNTER — Ambulatory Visit: Admit: 2023-09-07 | Discharge: 2023-09-07 | Payer: MEDICARE

## 2023-09-07 DIAGNOSIS — Z23 Encounter for immunization: Principal | ICD-10-CM

## 2023-09-07 DIAGNOSIS — E785 Hyperlipidemia, unspecified: Principal | ICD-10-CM

## 2023-09-07 DIAGNOSIS — E119 Type 2 diabetes mellitus without complications: Principal | ICD-10-CM

## 2023-09-07 DIAGNOSIS — Z0000 Encounter for general adult medical examination without abnormal findings: Principal | ICD-10-CM

## 2023-09-08 ENCOUNTER — Institutional Professional Consult (permissible substitution): Admit: 2023-09-08 | Discharge: 2023-09-09 | Payer: MEDICARE | Attending: Audiologist | Primary: Audiologist

## 2023-09-08 ENCOUNTER — Ambulatory Visit: Admit: 2023-09-08 | Discharge: 2023-09-09 | Payer: MEDICARE

## 2023-09-08 DIAGNOSIS — Z9622 Myringotomy tube(s) status: Principal | ICD-10-CM

## 2023-09-08 DIAGNOSIS — H906 Mixed conductive and sensorineural hearing loss, bilateral: Principal | ICD-10-CM

## 2023-09-08 DIAGNOSIS — H65493 Other chronic nonsuppurative otitis media, bilateral: Principal | ICD-10-CM

## 2023-09-10 DIAGNOSIS — J479 Bronchiectasis, uncomplicated: Principal | ICD-10-CM

## 2023-09-10 MED ORDER — AZITHROMYCIN 250 MG TABLET
ORAL_TABLET | Freq: Every day | ORAL | 3 refills | 90 days
Start: 2023-09-10 — End: 2024-09-09

## 2023-09-10 MED FILL — CETIRIZINE 10 MG TABLET: ORAL | 90 days supply | Qty: 90 | Fill #2

## 2023-09-13 MED ORDER — AZITHROMYCIN 250 MG TABLET
ORAL_TABLET | Freq: Every day | ORAL | 3 refills | 90 days | Status: CP
Start: 2023-09-13 — End: 2024-09-12
  Filled 2023-09-15: qty 90, 90d supply, fill #0

## 2023-10-08 DIAGNOSIS — J4550 Severe persistent asthma, uncomplicated: Principal | ICD-10-CM

## 2023-10-08 DIAGNOSIS — A498 Other bacterial infections of unspecified site: Principal | ICD-10-CM

## 2023-10-08 DIAGNOSIS — J479 Bronchiectasis, uncomplicated: Principal | ICD-10-CM

## 2023-10-08 DIAGNOSIS — E119 Type 2 diabetes mellitus without complications: Principal | ICD-10-CM

## 2023-10-19 MED FILL — OMEPRAZOLE 20 MG CAPSULE,DELAYED RELEASE: ORAL | 90 days supply | Qty: 180 | Fill #3

## 2023-10-19 MED FILL — MONTELUKAST 10 MG TABLET: ORAL | 90 days supply | Qty: 90 | Fill #3

## 2023-11-01 MED FILL — ATORVASTATIN 20 MG TABLET: ORAL | 90 days supply | Qty: 90 | Fill #2

## 2023-11-01 MED FILL — METFORMIN ER 500 MG TABLET,EXTENDED RELEASE 24 HR: ORAL | 90 days supply | Qty: 360 | Fill #3

## 2023-11-17 ENCOUNTER — Ambulatory Visit: Admit: 2023-11-17 | Discharge: 2023-11-17 | Payer: MEDICARE | Attending: Pulmonary Disease | Primary: Pulmonary Disease

## 2023-11-17 ENCOUNTER — Ambulatory Visit: Admit: 2023-11-17 | Discharge: 2023-11-17 | Payer: MEDICARE

## 2023-11-17 DIAGNOSIS — Z148 Genetic carrier of other disease: Principal | ICD-10-CM

## 2023-11-17 DIAGNOSIS — J479 Bronchiectasis, uncomplicated: Principal | ICD-10-CM

## 2023-11-17 DIAGNOSIS — J4550 Severe persistent asthma, uncomplicated: Principal | ICD-10-CM

## 2023-11-17 DIAGNOSIS — J449 Chronic obstructive pulmonary disease, unspecified: Principal | ICD-10-CM

## 2023-11-17 DIAGNOSIS — G4733 Obstructive sleep apnea (adult) (pediatric): Principal | ICD-10-CM

## 2023-11-17 DIAGNOSIS — Z2239 Carrier of other specified bacterial diseases: Principal | ICD-10-CM

## 2023-11-17 MED ORDER — BREZTRI AEROSPHERE 160 MCG-9MCG-4.8MCG/ACTUATION HFA AEROSOL INHALER
Freq: Two times a day (BID) | RESPIRATORY_TRACT | 8 refills | 0.00 days | Status: CP
Start: 2023-11-17 — End: ?

## 2023-11-24 DIAGNOSIS — J479 Bronchiectasis, uncomplicated: Principal | ICD-10-CM

## 2023-11-24 DIAGNOSIS — A498 Other bacterial infections of unspecified site: Principal | ICD-10-CM

## 2023-11-24 MED ORDER — CAYSTON 75 MG/ML SOLUTION FOR NEBULIZATION
Freq: Three times a day (TID) | RESPIRATORY_TRACT | 3 refills | 28.00 days | Status: CP
Start: 2023-11-24 — End: 2024-11-23

## 2023-11-24 MED ORDER — NEBULIZERS
0 refills | 0.00 days | Status: CP
Start: 2023-11-24 — End: ?

## 2023-12-01 DIAGNOSIS — Z760 Encounter for issue of repeat prescription: Principal | ICD-10-CM

## 2023-12-01 DIAGNOSIS — E119 Type 2 diabetes mellitus without complications: Principal | ICD-10-CM

## 2023-12-14 DIAGNOSIS — Z760 Encounter for issue of repeat prescription: Principal | ICD-10-CM

## 2023-12-14 DIAGNOSIS — E119 Type 2 diabetes mellitus without complications: Principal | ICD-10-CM

## 2023-12-14 MED ORDER — EMPAGLIFLOZIN 25 MG TABLET
ORAL_TABLET | Freq: Every day | ORAL | 1 refills | 90.00 days | Status: CP
Start: 2023-12-14 — End: ?

## 2023-12-14 MED ORDER — ATORVASTATIN 20 MG TABLET
ORAL_TABLET | Freq: Every evening | ORAL | 1 refills | 90 days | Status: CP
Start: 2023-12-14 — End: ?

## 2023-12-14 MED ORDER — METFORMIN ER 500 MG TABLET,EXTENDED RELEASE 24 HR
ORAL_TABLET | Freq: Every day | ORAL | 1 refills | 90.00 days | Status: CP
Start: 2023-12-14 — End: ?

## 2023-12-14 MED ORDER — OMEPRAZOLE 20 MG CAPSULE,DELAYED RELEASE
ORAL_CAPSULE | Freq: Two times a day (BID) | ORAL | 1 refills | 90.00 days | Status: CP
Start: 2023-12-14 — End: ?

## 2023-12-15 DIAGNOSIS — J4550 Severe persistent asthma, uncomplicated: Principal | ICD-10-CM

## 2023-12-15 DIAGNOSIS — Z760 Encounter for issue of repeat prescription: Principal | ICD-10-CM

## 2023-12-15 DIAGNOSIS — J479 Bronchiectasis, uncomplicated: Principal | ICD-10-CM

## 2023-12-15 DIAGNOSIS — J471 Bronchiectasis with (acute) exacerbation: Principal | ICD-10-CM

## 2023-12-15 MED ORDER — AZITHROMYCIN 250 MG TABLET
ORAL_TABLET | Freq: Every day | ORAL | 3 refills | 90.00 days | Status: CP
Start: 2023-12-15 — End: 2024-12-14

## 2023-12-15 MED ORDER — MONTELUKAST 10 MG TABLET
ORAL_TABLET | Freq: Every evening | ORAL | 3 refills | 90.00 days | Status: CP
Start: 2023-12-15 — End: ?

## 2023-12-15 MED ORDER — ALBUTEROL SULFATE 2.5 MG/3 ML (0.083 %) SOLUTION FOR NEBULIZATION
5 refills | 0.00 days | Status: CP
Start: 2023-12-15 — End: ?

## 2023-12-15 MED ORDER — CETIRIZINE 10 MG TABLET
ORAL_TABLET | Freq: Every day | ORAL | 3 refills | 90.00 days | Status: CP
Start: 2023-12-15 — End: ?

## 2023-12-15 MED ORDER — SODIUM CHLORIDE 7 % FOR NEBULIZATION
Freq: Two times a day (BID) | RESPIRATORY_TRACT | 3 refills | 90.00 days | Status: CP
Start: 2023-12-15 — End: 2024-12-14

## 2023-12-15 MED ORDER — ALBUTEROL SULFATE HFA 90 MCG/ACTUATION AEROSOL INHALER
RESPIRATORY_TRACT | 2 refills | 51.00 days | Status: CP | PRN
Start: 2023-12-15 — End: ?

## 2023-12-22 ENCOUNTER — Ambulatory Visit: Admit: 2023-12-22 | Discharge: 2023-12-23 | Payer: MEDICARE

## 2023-12-22 DIAGNOSIS — E119 Type 2 diabetes mellitus without complications: Principal | ICD-10-CM

## 2023-12-22 MED ORDER — PIOGLITAZONE 15 MG TABLET
ORAL_TABLET | Freq: Every day | ORAL | 0 refills | 30.00 days | Status: CP
Start: 2023-12-22 — End: 2024-01-21

## 2023-12-29 DIAGNOSIS — A498 Other bacterial infections of unspecified site: Principal | ICD-10-CM

## 2023-12-29 DIAGNOSIS — J479 Bronchiectasis, uncomplicated: Principal | ICD-10-CM

## 2023-12-29 MED ORDER — CAYSTON 75 MG/ML SOLUTION FOR NEBULIZATION
Freq: Three times a day (TID) | RESPIRATORY_TRACT | 3 refills | 28.00 days | Status: CP
Start: 2023-12-29 — End: 2024-12-28

## 2023-12-29 MED ORDER — NEBULIZERS
0 refills | 0.00 days | Status: CP
Start: 2023-12-29 — End: ?

## 2023-12-31 ENCOUNTER — Ambulatory Visit
Admit: 2023-12-31 | Discharge: 2024-01-01 | Payer: MEDICARE | Attending: Student in an Organized Health Care Education/Training Program | Primary: Student in an Organized Health Care Education/Training Program

## 2024-01-19 DIAGNOSIS — E119 Type 2 diabetes mellitus without complications: Principal | ICD-10-CM

## 2024-01-19 MED ORDER — PIOGLITAZONE 15 MG TABLET
ORAL_TABLET | Freq: Every day | ORAL | 6 refills | 30.00 days | Status: CP
Start: 2024-01-19 — End: 2024-02-18

## 2024-01-21 MED ORDER — PIOGLITAZONE 15 MG TABLET
ORAL_TABLET | Freq: Every day | ORAL | 5 refills | 30.00 days | Status: CP
Start: 2024-01-21 — End: 2024-07-19

## 2024-02-02 ENCOUNTER — Ambulatory Visit: Admit: 2024-02-02 | Discharge: 2024-02-03

## 2024-02-02 DIAGNOSIS — J45909 Unspecified asthma, uncomplicated: Principal | ICD-10-CM

## 2024-02-02 DIAGNOSIS — E119 Type 2 diabetes mellitus without complications: Principal | ICD-10-CM

## 2024-02-02 DIAGNOSIS — E8801 Alpha-1-antitrypsin deficiency: Principal | ICD-10-CM

## 2024-02-02 DIAGNOSIS — H25813 Combined forms of age-related cataract, bilateral: Principal | ICD-10-CM

## 2024-02-02 DIAGNOSIS — G4733 Obstructive sleep apnea (adult) (pediatric): Principal | ICD-10-CM

## 2024-02-16 ENCOUNTER — Encounter
Admit: 2024-02-16 | Discharge: 2024-02-16 | Payer: Medicare (Managed Care) | Attending: Student in an Organized Health Care Education/Training Program | Primary: Student in an Organized Health Care Education/Training Program

## 2024-02-16 ENCOUNTER — Inpatient Hospital Stay: Admit: 2024-02-16 | Discharge: 2024-02-16 | Payer: MEDICARE

## 2024-02-16 MED ORDER — PREDNISOLONE ACETATE 1 % EYE DROPS,SUSPENSION
Freq: Four times a day (QID) | OPHTHALMIC | 1 refills | 25.00 days | Status: CP
Start: 2024-02-16 — End: 2024-03-17

## 2024-02-16 MED ORDER — MOXIFLOXACIN 0.5 % EYE DROPS
Freq: Four times a day (QID) | OPHTHALMIC | 0 refills | 15.00 days | Status: CP
Start: 2024-02-16 — End: 2024-02-23

## 2024-02-16 MED ORDER — KETOROLAC 0.5 % EYE DROPS
Freq: Four times a day (QID) | OPHTHALMIC | 0 refills | 25.00 days | Status: CP
Start: 2024-02-16 — End: 2024-03-17

## 2024-02-17 ENCOUNTER — Ambulatory Visit: Admit: 2024-02-17 | Discharge: 2024-02-18 | Payer: Medicare (Managed Care)

## 2024-02-17 DIAGNOSIS — Z961 Presence of intraocular lens: Principal | ICD-10-CM

## 2024-02-17 DIAGNOSIS — Z9842 Cataract extraction status, left eye: Principal | ICD-10-CM

## 2024-02-23 ENCOUNTER — Ambulatory Visit: Admit: 2024-02-23 | Discharge: 2024-02-23 | Payer: MEDICARE

## 2024-02-23 DIAGNOSIS — J9611 Chronic respiratory failure with hypoxia: Principal | ICD-10-CM

## 2024-02-23 DIAGNOSIS — J4550 Severe persistent asthma, uncomplicated: Principal | ICD-10-CM

## 2024-02-23 DIAGNOSIS — Z0000 Encounter for general adult medical examination without abnormal findings: Principal | ICD-10-CM

## 2024-02-23 DIAGNOSIS — J479 Bronchiectasis, uncomplicated: Principal | ICD-10-CM

## 2024-02-23 DIAGNOSIS — E119 Type 2 diabetes mellitus without complications: Principal | ICD-10-CM

## 2024-02-23 DIAGNOSIS — E785 Hyperlipidemia, unspecified: Principal | ICD-10-CM

## 2024-02-23 DIAGNOSIS — Z23 Encounter for immunization: Principal | ICD-10-CM

## 2024-02-23 MED ORDER — FLUTICASONE PROPIONATE 50 MCG/ACTUATION NASAL SPRAY,SUSPENSION
Freq: Every day | NASAL | 0 refills | 60.00 days | Status: CP
Start: 2024-02-23 — End: ?

## 2024-02-25 DIAGNOSIS — E119 Type 2 diabetes mellitus without complications: Principal | ICD-10-CM

## 2024-02-25 MED ORDER — PIOGLITAZONE 30 MG TABLET
ORAL_TABLET | Freq: Every day | ORAL | 2 refills | 30.00000 days | Status: CP
Start: 2024-02-25 — End: 2024-05-25

## 2024-03-06 DIAGNOSIS — H9190 Unspecified hearing loss, unspecified ear: Principal | ICD-10-CM

## 2024-03-07 ENCOUNTER — Ambulatory Visit: Admit: 2024-03-07 | Discharge: 2024-03-07 | Payer: Medicare (Managed Care)

## 2024-03-07 DIAGNOSIS — H9190 Unspecified hearing loss, unspecified ear: Principal | ICD-10-CM

## 2024-03-07 DIAGNOSIS — H903 Sensorineural hearing loss, bilateral: Principal | ICD-10-CM

## 2024-03-08 ENCOUNTER — Ambulatory Visit: Admit: 2024-03-08 | Discharge: 2024-03-08 | Payer: Medicare (Managed Care)

## 2024-03-08 DIAGNOSIS — J479 Bronchiectasis, uncomplicated: Principal | ICD-10-CM

## 2024-03-08 DIAGNOSIS — Z961 Presence of intraocular lens: Principal | ICD-10-CM

## 2024-03-08 DIAGNOSIS — Z9842 Cataract extraction status, left eye: Principal | ICD-10-CM

## 2024-04-03 DIAGNOSIS — J4550 Severe persistent asthma, uncomplicated: Principal | ICD-10-CM

## 2024-04-03 DIAGNOSIS — J449 Chronic obstructive pulmonary disease, unspecified: Principal | ICD-10-CM

## 2024-04-03 DIAGNOSIS — J479 Bronchiectasis, uncomplicated: Principal | ICD-10-CM

## 2024-04-04 ENCOUNTER — Inpatient Hospital Stay: Admit: 2024-04-04 | Discharge: 2024-04-04 | Payer: Medicare (Managed Care)

## 2024-04-04 ENCOUNTER — Ambulatory Visit
Admit: 2024-04-04 | Discharge: 2024-04-04 | Payer: Medicare (Managed Care) | Attending: Internal Medicine | Primary: Internal Medicine

## 2024-04-04 DIAGNOSIS — J9611 Chronic respiratory failure with hypoxia: Principal | ICD-10-CM

## 2024-04-04 DIAGNOSIS — J479 Bronchiectasis, uncomplicated: Principal | ICD-10-CM

## 2024-04-04 DIAGNOSIS — Z2239 Carrier of other specified bacterial diseases: Principal | ICD-10-CM

## 2024-04-04 DIAGNOSIS — Z148 Genetic carrier of other disease: Principal | ICD-10-CM

## 2024-04-04 DIAGNOSIS — J4550 Severe persistent asthma, uncomplicated: Principal | ICD-10-CM

## 2024-05-10 DIAGNOSIS — A498 Other bacterial infections of unspecified site: Principal | ICD-10-CM

## 2024-05-10 DIAGNOSIS — J479 Bronchiectasis, uncomplicated: Principal | ICD-10-CM

## 2024-05-10 MED ORDER — NEBULIZERS
0 refills | 0.00000 days | Status: CP
Start: 2024-05-10 — End: ?

## 2024-06-12 DIAGNOSIS — K219 Gastro-esophageal reflux disease without esophagitis: Principal | ICD-10-CM

## 2024-06-12 MED ORDER — ATORVASTATIN 20 MG TABLET
ORAL_TABLET | Freq: Every evening | ORAL | 3 refills | 90.00000 days | Status: CP
Start: 2024-06-12 — End: ?

## 2024-06-12 MED ORDER — OMEPRAZOLE 20 MG CAPSULE,DELAYED RELEASE
ORAL_CAPSULE | ORAL | 3 refills | 0.00000 days | Status: CP
Start: 2024-06-12 — End: ?

## 2024-06-12 MED ORDER — METFORMIN ER 500 MG TABLET,EXTENDED RELEASE 24 HR
ORAL_TABLET | ORAL | 3 refills | 0.00000 days | Status: CP
Start: 2024-06-12 — End: ?

## 2024-07-07 ENCOUNTER — Emergency Department: Payer: No Typology Code available for payment source

## 2024-07-07 ENCOUNTER — Emergency Department
Admission: EM | Admit: 2024-07-07 | Discharge: 2024-07-07 | Disposition: A | Discharge status: Home / Self Care | Payer: No Typology Code available for payment source | Attending: Emergency Medicine | Admitting: Emergency Medicine

## 2024-07-07 ENCOUNTER — Other Ambulatory Visit: Payer: Self-pay

## 2024-07-07 DIAGNOSIS — M940 Chondrocostal junction syndrome [Tietze]: Secondary | ICD-10-CM | POA: Insufficient documentation

## 2024-07-07 DIAGNOSIS — J45909 Unspecified asthma, uncomplicated: Secondary | ICD-10-CM | POA: Insufficient documentation

## 2024-07-07 DIAGNOSIS — R0781 Pleurodynia: Secondary | ICD-10-CM | POA: Diagnosis present

## 2024-07-07 DIAGNOSIS — J90 Pleural effusion, not elsewhere classified: Secondary | ICD-10-CM | POA: Diagnosis not present

## 2024-07-07 MED ORDER — KETOROLAC TROMETHAMINE 15 MG/ML IJ SOLN
15.0000 mg | Freq: Once | INTRAMUSCULAR | Status: AC
  Administered 2024-07-07: 15 mg via INTRAMUSCULAR
  Filled 2024-07-07: qty 1

## 2024-07-07 MED ORDER — DEXAMETHASONE SODIUM PHOSPHATE 10 MG/ML IJ SOLN
10.0000 mg | Freq: Once | INTRAMUSCULAR | Status: AC
  Administered 2024-07-07: 10 mg via INTRAMUSCULAR
  Filled 2024-07-07: qty 1

## 2024-07-07 MED ORDER — LIDOCAINE 5 % EX PTCH
1.0000 | MEDICATED_PATCH | CUTANEOUS | Status: DC
  Administered 2024-07-07: 1 via TRANSDERMAL
  Filled 2024-07-07: qty 1

## 2024-07-07 NOTE — ED Provider Notes (Signed)
 [facility] Provider Note    Event Date/Time   First MD Initiated Contact with Patient 07/07/24 1148     (approximate)   History   Rib Injury   HPI  Jake Rademaker Sr. is a 58 y.o. male with PMH of bronchiectasis, cryptococcal pneumonitis, Mycobacterium avium intracellular RA and asthmatic bronchitis presents for evaluation of left rib pain.  Patient states on Tuesday he had a coughing fit and felt like he pulled something in his chest.  Reports that prior to this and he was feeling well.  His oxygenation has been doing well, no fevers and no additional mucus production.      Physical Exam   Triage Vital Signs: ED Triage Vitals  Encounter Vitals Group     BP 07/07/24 1059 132/88     Girls Systolic BP Percentile --      Girls Diastolic BP Percentile --      Boys Systolic BP Percentile --      Boys Diastolic BP Percentile --      Pulse Rate 07/07/24 1059 79     Resp 07/07/24 1059 19     Temp 07/07/24 1059 98 F (36.7 C)     Temp src --      SpO2 07/07/24 1059 93 %     Weight 07/07/24 1133 202 lb 6.1 oz (91.8 kg)     Height 07/07/24 1133 5' 7 (1.702 m)     Head Circumference --      Peak Flow --      Pain Score --      Pain Loc --      Pain Education --      Exclude from Growth Chart --     Most recent vital signs: Vitals:   07/07/24 1059  BP: 132/88  Pulse: 79  Resp: 19  Temp: 98 F (36.7 C)  SpO2: 93%   General: Awake, no distress.  CV:  Good peripheral perfusion.  RRR. Resp:  Normal effort.  Right lung is CTA, left had some crackles in the lower lung field Abd:  No distention.  Other:     ED Results / Procedures / Treatments   Labs (all labs ordered are listed, but only abnormal results are displayed) Labs Reviewed - No data to display    RADIOLOGY  Left rib x-ray obtained, interpreted the images as well as reviewed the radiologist report which was negative for fractures.  There is a patchy airspace opacity at  the left lung base with trace left pleural effusion.  Pneumonia cannot be excluded and radiologist suggested follow-up chest x-rays in 6 to 8 weeks.   PROCEDURES:  Critical Care performed: No  Procedures   MEDICATIONS ORDERED IN ED: Medications  lidocaine  (LIDODERM ) 5 % 1 patch (has no administration in time range)  ketorolac  (TORADOL ) 15 MG/ML injection 15 mg (has no administration in time range)  dexamethasone  (DECADRON ) injection 10 mg (has no administration in time range)     IMPRESSION / MDM / ASSESSMENT AND PLAN / ED COURSE  I reviewed the triage vital signs and the nursing notes.                             58 year old male presents for evaluation of left rib pain.  Vital signs are stable patient NAD on exam.  Differential diagnosis includes, but is not limited to, costochondritis, rib fracture, pneumonia, pleurisy, pneumothorax.  Patient's presentation is  most consistent with acute complicated illness / injury requiring diagnostic workup.  Left rib x-ray does not show any fractures but there is a patchy airspace opacity at the left lung base with trace left pleural effusion.  This correlates with crackles heard on lung exam.  Patient has not had any fevers, worsening cough or shortness of breath so low suspicion for pneumonia at this time.  Patient reports that he is followed by [facility] pulmonology and regularly gets sputum samples due to his previous history of lung infections with abnormal bacteria.  When the bacteria reaches a certain level he receives IV antibiotics through PICC line.  Will hold off on antibiotics at this time but advised him to follow-up with his pulmonologist to give another sputum sample.  Did review signs and symptoms of pneumonia for patient to watch for and advised him to return to the emergency department should he develop these.  Believe the pain in his ribs is due to costochondritis.  Will treat with lidocaine  patch, Toradol  and dexamethasone .  Advised  Tylenol and ibuprofen as well as other topical pain relievers at home.  Patient voiced understanding, all questions were answered and he was stable at discharge.     FINAL CLINICAL IMPRESSION(S) / ED DIAGNOSES   Final diagnoses:  Costochondritis, acute     Rx / DC Orders   ED Discharge Orders     None        Note:  This document was prepared using Dragon voice recognition software and may include unintentional dictation errors.   Cleaster Tinnie LABOR, PA-C 07/07/24 1310    Willo Dunnings, MD 07/07/24 1911

## 2024-07-07 NOTE — ED Notes (Signed)
 See triage note  Presents with anterior rib pain  States he coughed and felt a pull  Has had increased pain since

## 2024-07-07 NOTE — Discharge Instructions (Addendum)
 Chest x-ray did not show any broken ribs.  There was an area that was concerning for potential pneumonia although this is unclear.  They recommended getting another chest x-ray in 6 to 8 weeks.  Because you are not having symptoms of pneumonia today we held off on treating with antibiotics. Please follow-up with your pulmonologist to give another sputum sample, they can prescribe antibiotics if needed at that time.  I believe your rib pain is due to a pulled muscle in between the ribs. You can take 650 mg of Tylenol and 600 mg of ibuprofen every 6 hours as needed for pain. You can use ice, heat, muscle creams and other topical pain relievers as well.  Return to the emergency department if you have worsening pain, difficulty breathing, increased sputum production or a fever.

## 2024-07-07 NOTE — ED Triage Notes (Signed)
 Pt comes with left rib pain. Pt states this started Tuesday. Pt bent over to pick up something nad feels he pulled something. Pt had 5 min coughing spell and felt something give.  Pt does have hx of lung disease and wears 2,5 L Rainier chronically.

## 2024-07-11 ENCOUNTER — Inpatient Hospital Stay: Admit: 2024-07-11 | Discharge: 2024-07-12 | Payer: Medicare (Managed Care)

## 2024-07-11 ENCOUNTER — Ambulatory Visit: Admit: 2024-07-11 | Discharge: 2024-07-12 | Payer: Medicare (Managed Care)

## 2024-07-11 DIAGNOSIS — J479 Bronchiectasis, uncomplicated: Principal | ICD-10-CM

## 2024-07-17 ENCOUNTER — Inpatient Hospital Stay: Admit: 2024-07-17 | Discharge: 2024-07-18 | Payer: Medicare (Managed Care)

## 2024-07-17 DIAGNOSIS — J471 Bronchiectasis with (acute) exacerbation: Principal | ICD-10-CM

## 2024-07-17 DIAGNOSIS — J479 Bronchiectasis, uncomplicated: Principal | ICD-10-CM

## 2024-07-17 DIAGNOSIS — A498 Other bacterial infections of unspecified site: Principal | ICD-10-CM

## 2024-07-17 MED ORDER — NEBULIZERS
11 refills | 0.00000 days | Status: CP
Start: 2024-07-17 — End: ?

## 2024-07-17 MED ORDER — CAYSTON 75 MG/ML SOLUTION FOR NEBULIZATION
Freq: Three times a day (TID) | RESPIRATORY_TRACT | 3 refills | 28.00000 days | Status: CP
Start: 2024-07-17 — End: 2025-07-17

## 2024-07-17 MED ORDER — SULFAMETHOXAZOLE 800 MG-TRIMETHOPRIM 160 MG TABLET
ORAL_TABLET | Freq: Two times a day (BID) | ORAL | 0 refills | 14.00000 days | Status: CP
Start: 2024-07-17 — End: 2024-07-31

## 2024-07-17 MED ORDER — BRINSUPRI 25 MG TABLET
ORAL_TABLET | Freq: Every day | ORAL | 3 refills | 90.00000 days | Status: CP
Start: 2024-07-17 — End: 2025-07-17

## 2024-07-19 DIAGNOSIS — J479 Bronchiectasis, uncomplicated: Principal | ICD-10-CM

## 2024-08-11 NOTE — Unmapped (Signed)
 [facility] Specialty Medication Onboarding    Specialty Medication: [name]  Prior Authorization: Approved   Financial Assistance: Yes - grant approved as secondary   Final Copay/Day Supply: $0 / 30    Insurance Restrictions: None     Notes to Pharmacist: None  Credit Card on File: no  Delivery Method (based on home address currently on file): UPS restrictions - does not deliver on Mon & Thrus      The triage team has completed the benefits investigation and has determined that the patient is able to fill this medication at [facility]. Please contact the patient to complete the onboarding or follow up with the prescribing physician as needed.

## 2024-08-14 NOTE — Unmapped (Unsigned)
 [facility]    Patient Onboarding/Medication Counseling    Brett Hanna is a 58 y.o. male with bronchiectasis who I am counseling today on initiation of therapy.  I am speaking to the patient.    Was a Nurse, learning disability used for this call? No    Verified patient's date of birth / HIPAA.    Specialty medication(s) to be sent: CF/Pulmonary/Asthma: [name]      Non-specialty medications/supplies to be sent: n/a      Medications not needed at this time: n/a         [name] (Brensocatib)    Medication & Administration     Dosage:  Take 1 tablet (25 mg) by mouth daily    Administration:   Administer orally at the same time each day.   Swallow tablets whole. Do not split, crush or chew.  May be administered with or without food.    Adherence/Missed dose instructions: Skip the missed dose and go back to your normal time. Do not take 2 doses at the same time or extra doses.    Goals of Therapy     Reduce risk of non-cystic fibrosis bronchiectasis flares  Reduce lung function decline (with 25 mg dosing only)    Side Effects & Monitoring Parameters     Commonly reported side effects  Signs of a common cold like sore throat or runny nose  Headache  Skin problems like dry skin or small areas of thick skin is common. Talk with doctor regarding any new skin changes.    The following side effects should be reported to the provider:  Signs of an allergic reaction, like rash; hives; itching; red, swollen, blistered, or peeling skin with or without fever; wheezing; tightness in the chest or throat; trouble breathing, swallowing, or talking; unusual hoarseness; or swelling of the mouth, face, lips, tongue, or throat.  Signs of high blood pressure like very bad headache or dizziness, passing out, or change in eyesight.  Gum or teeth changes  New rashes or skin conditions     Monitoring Parameters: It is recommended that patients perform routine dental hygiene and have regular dental checkups while taking this medicine.    Contraindications, Warnings, & Precautions     Reproductive considerations: In animal reproduction studies, no adverse developmental effects were observed with oral brensocatib administered during organogenesis at exposures <=20 times the maximum recommended human dose (MRHD).   Breastfeeding considerations: It is not known if brensocatib is present in breast milk. According to the manufacturer, the decision to breastfeed during therapy should consider the risk of infant exposure, the benefits of breastfeeding to the infant, and benefits of treatment to the mother.  Vaccines: Avoid use of live attenuated vaccines during treatment; safety or effectiveness with concomitant use is unknown.     Drug/Food Interactions     Medication list reviewed in Epic. The patient was instructed to inform the care team before taking any new medications or supplements. No drug interactions identified.     Storage, Handling Precautions, & Disposal     Store in original container at room temperature in a dry place.  Do not store in a bathroom.   Keep the lid tightly closed. Keep out of the reach of children and pets.  Do not flush down a toilet or pour down a drain unless you are told to do so.        Current Medications (including OTC/herbals), Comorbidities and Allergies     Current Medications[1]  Allergies[2]    Problem List[3]    Medication list has been reviewed and updated in Epic: Yes    Allergies have been reviewed and updated in Epic: Yes    Appropriateness of Therapy     Acute infections noted within Epic:  MDR Pseudomonas  Patient reported infection: None    Is the medication and dose appropriate considering the patient???s diagnosis, treatment, and disease journey, comorbidities, medical history, current medications, allergies, therapeutic goals, self-administration ability, and access barriers? Yes    Prescription has been clinically reviewed: Yes      Baseline Quality of Life Assessment      How many days over the past month did your bronchiectasis  keep you from your normal activities? For example, brushing your teeth or getting up in the morning. 0 - Still has cough and SOB but still able to complete ADLs    Financial Information     Medication Assistance provided: Prior Authorization and [employer]    Anticipated copay of $0 reviewed with patient. Verified delivery address.    Delivery Information     Scheduled delivery date: 08/18/24    Expected start date: 08/18/24    Medication will be delivered via UPS to the prescription address in [facility].  This shipment will not require a signature.      Explained the services we provide at [facility] and that each month we would call to set up refills.  Stressed importance of returning phone calls so that we could ensure they receive their medications in time each month.  Informed patient that we should be setting up refills 7-10 days prior to when they will run out of medication.  A pharmacist will reach out to perform a clinical assessment periodically.  Informed patient that a welcome packet, containing information about our pharmacy and other support services, a Notice of Privacy Practices, and a drug information handout will be sent.      The patient or caregiver noted above participated in the development of this care plan and knows that they can request review of or adjustments to the care plan at any time.      Patient or caregiver verbalized understanding of the above information as well as how to contact the pharmacy at [phone] option 4 with any questions/concerns.  The pharmacy is open Monday through Friday 8:30am-4:30pm.  A pharmacist is available 24/7 via pager to answer any clinical questions they may have.    Patient Specific Needs     Does the patient have any physical, cognitive, or cultural barriers? No    Does the patient have adequate living arrangements? (i.e. the ability to store and take their medication appropriately) Yes    Did you identify any home environmental safety or security hazards? No    Patient prefers to have medications discussed with  Patient     Is the patient or caregiver able to read and understand education materials at a high school level or above? Yes    Patient's primary language is  English     Is the patient high risk? No    Does the patient have an additional or emergency contact listed in their chart? Yes    SOCIAL DETERMINANTS OF HEALTH     At the [facility], we have learned that life circumstances - like trouble affording food, housing, utilities, or transportation can affect the health of many of our patients.   That is why we wanted to  ask: are you currently experiencing any life circumstances that are negatively impacting your health and/or quality of life? Patient declined to answer    Social Drivers of Health     Food Insecurity: Food Insecurity Present (07/07/2024)    Received from [facility]    Hunger Vital Sign     Within the past 12 months, you worried that your food would run out before you got the money to buy more.: Sometimes true     Within the past 12 months, the food you bought just didn't last and you didn't have money to get more.: Sometimes true   Tobacco Use: Medium Risk (07/07/2024)    Received from [facility]    Patient History     Smoking Tobacco Use: Never     Smokeless Tobacco Use: Former     Passive Exposure: Not on file   Transportation Needs: No Transportation Needs (07/07/2024)    Received from [facility]     In the past 12 months, has lack of transportation kept you from medical appointments or from getting medications?: No     Lack of Transportation (Non-Medical): No   Alcohol  Use: Not At Risk (07/07/2024)    Received from [facility]    AUDIT-C     Q1: How often do you have a drink containing alcohol ?: Never     Q2: How many drinks containing alcohol  do you have on a typical day when you are drinking?: Patient does not drink     Q3: How often do you have six or more drinks on one occasion?: Never   Housing: High Risk (07/07/2024)    Received from [facility]    Housing Stability Vital Sign     In the last 12 months, was there a time when you were not able to pay the mortgage or rent on time?: Yes     In the past 12 months, how many times have you moved where you were living?: 0     At any time in the past 12 months, were you homeless or living in a shelter (including now)?: No   Physical Activity: Not on file   Utilities: Not At Risk (07/07/2024)    Received from [facility]     In the past 12 months has the electric, gas, oil, or water  company threatened to shut off services in your home?: No   Stress: Not on file   Interpersonal Safety: Not on file   Substance Use: Not on file (09/07/2023)   Intimate Partner Violence: Not At Risk (05/01/2022)    Humiliation, Afraid, Rape, and Kick questionnaire     Fear of Current or Ex-Partner: No     Emotionally Abused: No     Physically Abused: No     Sexually Abused: No   Social Connections: Not on file   Financial Resource Strain: Medium Risk (07/07/2024)    Received from [facility]    Overall Financial Resource Strain (CARDIA)     Difficulty of Paying Living Expenses: Somewhat hard   Health Literacy: Low Risk  (10/03/2020)    Health Literacy     : Never   Internet Connectivity: Not on file       Would you be willing to receive help with any of the needs that you have identified today? Not applicable  Shelba DELENA Hummer, PharmD  [facility] Specialty Pharmacist         [1]   Current Outpatient Medications   Medication Sig Dispense Refill    acetaminophen (TYLENOL) 500 MG tablet Take 1 tablet by mouth as needed for pain      albuterol  2.5 mg /3 mL (0.083 %) nebulizer solution Inhale the contents of 1 vial (3 ml) via nebulizer twice daily and every 6 hours as needed for shortness of breath and wheezing 360 mL 5    albuterol  HFA 90 mcg/actuation inhaler Inhale 2 puffs every four (4) hours as needed. 54 g 2    atorvastatin  (LIPITOR ) 20 MG tablet TAKE 1 TABLET EVERY EVENING 90 tablet 3    azithromycin  (ZITHROMAX ) 250 MG tablet Take 1 tablet (250 mg total) by mouth daily. 90 tablet 3    aztreonam  lysine  (CAYSTON ) 75 mg/mL Nebu nebulization solution Inhale 1 mL (75 mg total) by nebulization every eight (8) hours. Nebulize every other month after airway clearance using Altera nebulizer. 84 mL 3    brensocatib ([name]) 25 mg tablet Take 1 tablet (25 mg total) by mouth daily. 90 tablet 3    budesonide -glycopyr-formoterol (BREZTRI  AEROSPHERE) 160-9-4.8 mcg/actuation HFAA Inhale 2 puffs two (2) times a day. 32.1 g 8    cholecalciferol , vitamin D3, (VITAMIN D3) 2,000 unit cap Take 1 capsule (2,000 Units total) by mouth daily. 30 capsule prn    empagliflozin  (JARDIANCE ) 25 mg tablet Take 1 tablet (25 mg total) by mouth daily.      fluticasone  propionate (FLONASE ) 50 mcg/actuation nasal spray 2 sprays into each nostril daily. 16 g 0    lancets Misc Test once daily before breakfast. 100 each 3    metFORMIN  (GLUCOPHAGE -XR) 500 MG 24 hr tablet TAKE 4 TABLETS EVERY DAY WITH EVENING MEAL 360 tablet 3    MISCELLANEOUS MEDICAL SUPPLY MISC Frequency:PHARMDIR   Dosage:0.0     Instructions:  Note:CPAP 15 cm H20 with heated humidity for nasal dryness.  Mask: ResMed Quattro size Medium. Dx OSA. Dose: 1      montelukast  (SINGULAIR ) 10 mg tablet Take 1 tablet (10 mg total) by mouth nightly. 90 tablet 3    nebulizers (ALTERA NEBULIZER HANDSET) Misc Please use with Cayston  1 each 11    nebulizers (ALTERA NEBULIZER SYSTEM) Misc Please use with Cayston  medication. 1 each 0    nebulizers (LC PLUS) Misc USE AS DIRECTED with inhaled medications 1 each 5    nebulizers Misc Use with inhaled medication. 3 each 3    needle, disp, 21 G 21 gauge x 1 1/2 Ndle Use to draw up inject normal saline into colistin vial and to withdraw colistin dose for inhalation 100 each 5    omeprazole  (PRILOSEC) 20 MG capsule TAKE 1 CAPSULE TWICE DAILY 180 capsule 3    pioglitazone  (ACTOS ) 30 MG tablet Take 1 tablet (30 mg total) by mouth daily. 30 tablet 2    sod bicarb-sod chlor-neti pot pkdv Use twice per day      sodium chloride  7% 7 % Nebu Inhale 1 vial (4 mL) by nebulization Two (2) times a day. 720 mL 3    thiamine HCl, vitamin B1, (VITAMIN B-1) 250 MG tablet Take 1 tablet (250 mg total) by mouth daily.       No current facility-administered medications for this visit.   [2]   Allergies  Allergen Reactions    Codeine Shortness Of Breath and Rash    Rifampin  Fevers, chills.    Bamlanivimab  Other (See Comments)     Low Back pain    Daliresp [Roflumilast] Other (See Comments)     Back pain   [3]   Patient Active Problem List  Diagnosis    Allergic rhinitis    Alpha-1-antitrypsin deficiency carrier (MZ)    Benign neoplasm of colon    Bronchiectasis without complication    (CMS-HCC)    Hyperlipidemia    Obstructive sleep apnea syndrome    Chronic sinusitis    Vitamin D deficiency    Type 2 diabetes mellitus without complication, without long-term current use of insulin  (CMS-HCC)    Uncomplicated severe persistent asthma    (CMS-HCC)    Overweight    Migraine with aura and without status migrainosus, not intractable    Gastroesophageal reflux disease without esophagitis    Flexural eczema    Anxiety    Fatty liver    Burkholderia gladioli culture positive    Pseudomonas aeruginosa colonization    Chronic respiratory failure with hypoxia    (CMS-HCC)    Cataract    Senile nuclear cataract, left Bronchiectasis without complication    (CMS-HCC)    Hyperlipidemia    Obstructive sleep apnea syndrome    Chronic sinusitis    Vitamin D deficiency    Type 2 diabetes mellitus without complication, without long-term current use of insulin  (CMS-HCC)    Uncomplicated severe persistent asthma    (CMS-HCC)    Overweight    Migraine with aura and without status migrainosus, not intractable    Gastroesophageal reflux disease without esophagitis    Flexural eczema    Anxiety    Fatty liver    Burkholderia gladioli culture positive    Pseudomonas aeruginosa colonization    Chronic respiratory failure with hypoxia    (CMS-HCC)    Cataract    Senile nuclear cataract, left

## 2024-08-16 ENCOUNTER — Ambulatory Visit: Admit: 2024-08-16 | Discharge: 2024-08-17 | Payer: Medicare (Managed Care)

## 2024-08-16 DIAGNOSIS — J479 Bronchiectasis, uncomplicated: Principal | ICD-10-CM

## 2024-08-16 MED FILL — BRINSUPRI 25 MG TABLET: ORAL | 30 days supply | Qty: 30 | Fill #0

## 2024-08-17 NOTE — Unmapped (Signed)
 Spoke to wife who states that she was recently diagnosed as having K. Pneumoniae in her urinary tract, and so patient submitted a sputum to make sure he didn't have it in his lungs. Advised wife that the bacteria may be more problematic in some areas of the body than others, but that we see this bacteria with some frequency, and that I would keep my eyes peeled for his lower respiratory culture results.  Pt is currently not having any worsening of pulmonary symptoms. Additionally, spouse states pt will begin [name] soon.

## 2024-09-01 DIAGNOSIS — J4550 Severe persistent asthma, uncomplicated: Principal | ICD-10-CM

## 2024-09-04 DIAGNOSIS — J479 Bronchiectasis, uncomplicated: Principal | ICD-10-CM

## 2024-09-04 DIAGNOSIS — A498 Other bacterial infections of unspecified site: Principal | ICD-10-CM

## 2024-09-08 NOTE — Progress Notes (Signed)
 [facility] Clinical Assessment & Refill Coordination Note    Brett Osten Sr., DOB: [birth year]  Phone: There are no phone numbers on file.    All above HIPAA information was verified with patient's family member, wife [facility]).     Was a nurse, learning disability used for this call? No    Specialty Medication(s):   CF/Pulmonary/Asthma: [name]     Current Medications[1]     Changes to medications: Jalani reports no changes at this time.    Medication list has been reviewed and updated in Epic: Yes    Allergies[2]    Changes to allergies: No    Allergies have been reviewed and updated in Epic: Yes    SPECIALTY MEDICATION ADHERENCE     [name] 25 mg: 8 days of medicine on hand     Medication Adherence    Patient reported X missed doses in the last month: 0  Specialty Medication: [name] 25mg  daily  Patient is on additional specialty medications: No  Patient is on more than two specialty medications: No  Any gaps in refill history greater than 2 weeks in the last 3 months: no  Demonstrates understanding of importance of adherence: yes  Informant: spouse  Support network for adherence: family member          Specialty medication(s) dose(s) confirmed: Regimen is correct and unchanged.     Are there any concerns with adherence? No    Adherence counseling provided? Not needed    CLINICAL MANAGEMENT AND INTERVENTION      Clinical Benefit Assessment:    Do you feel the medicine is effective or helping your condition? Yes - breathing more easily but still having coughing spells    Clinical Benefit counseling provided? Not needed - Wife discussed with provider about sputum culture results and to follow-up with local pulmonologist if symptoms worsen.    Adverse Effects Assessment:    Are you experiencing any side effects? No    Are you experiencing difficulty administering your medicine? No    Quality of Life Assessment:    Quality of Life    Rheumatology  Oncology  Dermatology  Cystic Fibrosis How many days over the past month did your bronchiectasis  keep you from your normal activities? For example, brushing your teeth or getting up in the morning. Wife states he is still having coughing spells but notes he is breathing more easier    Have you discussed this with your provider? Not needed    Acute Infection Status:    Acute infections noted within Epic:  MDR Pseudomonas    Patient reported infection: None    Therapy Appropriateness:    Is the medication and dose appropriate considering the patient???s diagnosis, treatment, and disease journey, comorbidities, medical history, current medications, allergies, therapeutic goals, self-administration ability, and access barriers? Yes, therapy is appropriate and should be continued     Clinical Intervention:    Was an intervention completed as part of this clinical assessment? No    DISEASE/MEDICATION-SPECIFIC INFORMATION      N/A    Other Pulmonary Conditions: Not Applicable    PATIENT SPECIFIC NEEDS     Does the patient have any physical, cognitive, or cultural barriers? No    Is the patient high risk? No    Does the patient require physician intervention or other additional services (i.e., nutrition, smoking cessation, social work)? No    Does the patient have an additional or emergency contact listed in their chart? Yes  SOCIAL DETERMINANTS OF HEALTH     At the [facility], we have learned that life circumstances - like trouble affording food, housing, utilities, or transportation can affect the health of many of our patients.   That is why we wanted to ask: are you currently experiencing any life circumstances that are negatively impacting your health and/or quality of life? Patient declined to answer    Social Drivers of Health     Food Insecurity: Food Insecurity Present (07/07/2024)    Received from [facility]    Hunger Vital Sign     Within the past 12 months, you worried that your food would run out before you got the money to buy more.: Sometimes true     Within the past 12 months, the food you bought just didn't last and you didn't have money to get more.: Sometimes true   Tobacco Use: Medium Risk (07/07/2024)    Received from [facility]    Patient History     Smoking Tobacco Use: Never     Smokeless Tobacco Use: Former     Passive Exposure: Not on file   Transportation Needs: No Transportation Needs (07/07/2024)    Received from [facility]     In the past 12 months, has lack of transportation kept you from medical appointments or from getting medications?: No     Lack of Transportation (Non-Medical): No   Alcohol  Use: Not At Risk (07/07/2024)    Received from [facility]    AUDIT-C     Q1: How often do you have a drink containing alcohol ?: Never     Q2: How many drinks containing alcohol  do you have on a typical day when you are drinking?: Patient does not drink     Q3: How often do you have six or more drinks on one occasion?: Never   Housing: High Risk (07/07/2024)    Received from [facility]    Housing Stability Vital Sign     In the last 12 months, was there a time when you were not able to pay the mortgage or rent on time?: Yes     In the past 12 months, how many times have you moved where you were living?: 0     At any time in the past 12 months, were you homeless or living in a shelter (including now)?: No   Physical Activity: Not on file   Utilities: Not At Risk (07/07/2024)    Received from [facility]     In the past 12 months has the electric, gas, oil, or water  company threatened to shut off services in your home?: No   Stress: Not on file   Interpersonal Safety: Not on file   Substance Use: Not on file (09/07/2023)   Intimate Partner Violence: Not At Risk (05/01/2022)    Humiliation, Afraid, Rape, and Kick questionnaire     Fear of Current or Ex-Partner: No     Emotionally Abused: No     Physically Abused: No Sexually Abused: No   Social Connections: Not on file   Financial Resource Strain: Medium Risk (07/07/2024)    Received from [facility]    Overall Financial Resource Strain (CARDIA)     Difficulty of Paying Living Expenses: Somewhat hard   Health Literacy: Low Risk  (10/03/2020)    Health Literacy     :  Never   Internet Connectivity: Not on file       Would you be willing to receive help with any of the needs that you have identified today? Not applicable       SHIPPING     Specialty Medication(s) to be Shipped:   CF/Pulmonary/Asthma: [name]    Other medication(s) to be shipped: No additional medications requested for fill at this time    Specialty Medications not needed at this time: N/A     Changes to insurance: No    Cost and Payment: Patient has a $0 copay, payment information is not required.    Delivery Scheduled: Yes, Expected medication delivery date: 09/13/24.     Medication will be delivered via UPS to the confirmed prescription address in [facility].    The patient will receive a drug information handout for each medication shipped and additional FDA Medication Guides as required.  Verified that patient has previously received a Conservation Officer, Historic Buildings and a Surveyor, Mining.    The patient or caregiver noted above participated in the development of this care plan and knows that they can request review of or adjustments to the care plan at any time.      All of the patient's questions and concerns have been addressed.    Shelba DELENA Hummer, PharmD   [facility] Specialty Pharmacist         [1]   Current Outpatient Medications   Medication Sig Dispense Refill    acetaminophen (TYLENOL) 500 MG tablet Take 1 tablet by mouth as needed for pain      albuterol  2.5 mg /3 mL (0.083 %) nebulizer solution Inhale the contents of 1 vial (3 ml) via nebulizer twice daily and every 6 hours as needed for shortness of breath and wheezing 360 mL 5    albuterol  HFA 90 mcg/actuation inhaler Inhale 2 puffs every four (4) hours as needed. 54 g 2    atorvastatin  (LIPITOR ) 20 MG tablet TAKE 1 TABLET EVERY EVENING 90 tablet 3    azithromycin  (ZITHROMAX ) 250 MG tablet Take 1 tablet (250 mg total) by mouth daily. 90 tablet 3    aztreonam  lysine  (CAYSTON ) 75 mg/mL Nebu nebulization solution Inhale 1 mL (75 mg total) by nebulization every eight (8) hours. Nebulize every other month after airway clearance using Altera nebulizer. 84 mL 3    brensocatib ([name]) 25 mg tablet Take 1 tablet (25 mg total) by mouth daily. 90 tablet 3    budesonide -glycopyr-formoterol (BREZTRI  AEROSPHERE) 160-9-4.8 mcg/actuation HFAA Inhale 2 puffs two (2) times a day. 32.1 g 8    carboxymethylcellulose sodium (THERATEARS) 0.25 % Drop Administer 1 drop to both eyes four (4) times a day as needed.      cetirizine  (ZYRTEC ) 10 MG tablet Take 1 tablet (10 mg total) by mouth daily.      cholecalciferol , vitamin D3, (VITAMIN D3) 2,000 unit cap Take 1 capsule (2,000 Units total) by mouth daily. 30 capsule prn    empagliflozin  (JARDIANCE ) 25 mg tablet Take 1 tablet (25 mg total) by mouth daily.      fexofenadine  (ALLEGRA ) 180 MG tablet Take 1 tablet (180 mg total) by mouth. Sometimes alternates with cetirizine       fluticasone  propionate (FLONASE ) 50 mcg/actuation nasal spray 2 sprays into each nostril daily. 16 g 0    lancets Misc Test once daily before breakfast. 100 each 3    metFORMIN  (GLUCOPHAGE -XR) 500 MG 24 hr tablet TAKE 4 TABLETS  EVERY DAY WITH EVENING MEAL 360 tablet 3    MISCELLANEOUS MEDICAL SUPPLY MISC Frequency:PHARMDIR   Dosage:0.0     Instructions:  Note:CPAP 15 cm H20 with heated humidity for nasal dryness.  Mask: ResMed Quattro size Medium. Dx OSA. Dose: 1      montelukast  (SINGULAIR ) 10 mg tablet Take 1 tablet (10 mg total) by mouth nightly. 90 tablet 3    nebulizers (ALTERA NEBULIZER HANDSET) Misc Please use with [place]  1 each 11    nebulizers (ALTERA NEBULIZER SYSTEM) Misc Please use with [place]  medication. 1 each 0    nebulizers (LC PLUS) Misc USE AS DIRECTED with inhaled medications 1 each 5    nebulizers Misc Use with inhaled medication. 3 each 3    needle, disp, 21 G 21 gauge x 1 1/2 Ndle Use to draw up inject normal saline into colistin vial and to withdraw colistin dose for inhalation 100 each 5    omeprazole  (PRILOSEC) 20 MG capsule TAKE 1 CAPSULE TWICE DAILY 180 capsule 3    pioglitazone  (ACTOS ) 30 MG tablet Take 1 tablet (30 mg total) by mouth daily. (Patient not taking: Reported on 08/15/2024) 30 tablet 2    sod bicarb-sod chlor-neti pot pkdv Use twice per day      sodium chloride  7% 7 % Nebu Inhale 1 vial (4 mL) by nebulization Two (2) times a day. 720 mL 3    thiamine HCl, vitamin B1, (VITAMIN B-1) 250 MG tablet Take 1 tablet (250 mg total) by mouth daily.       No current facility-administered medications for this visit.   [2]   Allergies  Allergen Reactions    Codeine Shortness Of Breath and Rash    Rifampin      Fevers, chills.    Bamlanivimab  Other (See Comments)     Low Back pain    Daliresp [Roflumilast] Other (See Comments)     Back pain

## 2024-09-12 MED FILL — BRINSUPRI 25 MG TABLET: ORAL | 30 days supply | Qty: 30 | Fill #1

## 2024-09-17 NOTE — Patient Instructions (Signed)
 What is Chronic Care Management (CCM)?  CCM is a program for patients with two or more medical conditions (examples are diabetes, high blood pressure, COPD, and more).  A Care Management Support Specialist that works with your provider will become your go to person at the clinic. They will call you regularly to help you with any needs. You can also call them.     How could CCM benefit me?  Improve your health  Help you understand your medications and find ways to make your medications more affordable  Help you understand your medical conditions  Help you with any needs for transportation, food, housing   Referrals to other health care providers such as nutrition support, pharmacy, etc.  And much more!    Do I qualify for the program?   If you have Medicare Advantage: the program is likely free for you.   If you have Medicare as your primary insurance PLUS you have a secondary insurance: the program is likely free for you.   If you have Medicare as your primary insurance PLUS you have [facility]: the program is likely free for you.     How do I sign up?   Signing up is really easy!    After discussing with your provider during your visit, they will enroll you in the program.  If your provider did not discuss with you today or you want to enroll after your visit, please contact Trina Clara [phone]    What happens next?   You will get a phone call in the next two weeks from your medical assistant to talk about next steps.      What if I have more questions?  Call [facility] @ [phone] and ask to speak to the Care Management Support Specialist.

## 2024-09-17 NOTE — Progress Notes (Addendum)
 Patient is aware of the services offered, that only one practitioner can furnish CCM, that there may be cost-sharing, and that they may withdraw at any time.         Patient Consent Status   During today's visit:   Who provided consent for Chronic Care Management? Patient  Patient consented by verbal consent       Chronic Obstructive Pulmonary Disease (COPD)                      Please get a flu shot every year.   Make sure you have received a pneumonia shot.  If you are not sure of your status please ask me or my nurse to look into it.  It is important to take your prescribed inhalers as directed.  Doing this will improve your breathing and help to keep you out of the hospital.  Let me know if you have questions about how to use your inhalers.   If you are having increased shortness of breath or cough, let me know immediately.  Avoid cigarette smoke.            Diabetes                      The goal for your diabetes is to have an A1c <7.0%.  Continue to take your diabetes medications as directed and contact me with any questions or concerns regarding the medications.  Let me know if you are having either very low or very high blood sugars.  Decrease your intake of carbohydrate rich foods.  If you need help with this we can have you meet with a dietician to discuss in more detail.  Perform physical activity 3 to 5 times per week for at least 15 minutes each time.   You should have your A1c  checked every 3-6 months.  It is important to see your eye doctor once a year to get a diabetic eye exam  It is important to look at your feet regularly to see if there is any sign of infection or injury.      If the patient meets clinical pharmacist/clinical pharmacist practitioner referral criteria, I authorize the [facility] clinical pharmacist/clinical pharmacist practitioner to adjust the medication regimen to assist the patient to safely reach their care plan goals. I understand the pharmacist will provide progress documentation to me via Epic and engage me if the patient's needs exceed the scope of the care plan.    Current Active List of Medical Conditions     Problem List             Diagnosed      Alpha-1-antitrypsin deficiency carrier (MZ)       Abnormal dilation of airways       Sleep apnea       Type 2 diabetes mellitus without complication, without long-term current use of insulin        Asthma       Overweight       Migraines       Acid reflux       Eczema       Anxiety       LIVER DISEASE       Burkholderia gladioli culture positive       Pseudomonas aeruginosa colonization       Chronic respiratory failure       Cataract       Cataract

## 2024-09-18 ENCOUNTER — Ambulatory Visit: Admit: 2024-09-18 | Discharge: 2024-09-19 | Payer: Medicare (Managed Care)

## 2024-09-18 DIAGNOSIS — J449 Chronic obstructive pulmonary disease, unspecified: Principal | ICD-10-CM

## 2024-09-18 DIAGNOSIS — E119 Type 2 diabetes mellitus without complications: Principal | ICD-10-CM

## 2024-09-18 DIAGNOSIS — Z23 Encounter for immunization: Principal | ICD-10-CM

## 2024-09-18 DIAGNOSIS — J479 Bronchiectasis, uncomplicated: Principal | ICD-10-CM

## 2024-09-18 DIAGNOSIS — E785 Hyperlipidemia, unspecified: Principal | ICD-10-CM

## 2024-09-18 LAB — HEMOGLOBIN A1C
ESTIMATED AVERAGE GLUCOSE: 177 mg/dL
HEMOGLOBIN A1C: 7.8 % — ABNORMAL HIGH (ref 4.7–5.6)

## 2024-09-18 LAB — COMPREHENSIVE METABOLIC PANEL
ALBUMIN/GLOBULIN RATIO: 1.4
ALBUMIN: 4.3 g/dL (ref 3.1–4.7)
ALKALINE PHOSPHATASE: 95 U/L (ref 33–120)
ALT (SGPT): 27 U/L (ref 3–36)
ANION GAP: 7 mmol/L (ref 5–16)
AST (SGOT): 17 U/L (ref 10–40)
BILIRUBIN TOTAL: 0.5 mg/dL (ref 0.2–1.2)
BLOOD UREA NITROGEN: 22 mg/dL — ABNORMAL HIGH (ref 8–21)
BUN / CREAT RATIO: 31 — ABNORMAL HIGH (ref 6–20)
CALCIUM: 9.7 mg/dL (ref 8.5–10.4)
CHLORIDE: 104 mmol/L (ref 98–110)
CO2: 27 mmol/L (ref 24.0–31.0)
CREATININE: 0.7 mg/dL (ref 0.50–1.40)
EGFR CKD-EPI (2021) MALE: >90 mL/min/1.73m2 (ref >=60)
GLOBULIN, TOTAL: 3 g/dL
GLUCOSE RANDOM: 89 mg/dL (ref 75–110)
OSMOLALITY CALCULATED: 278 mosm/kg
POTASSIUM: 4.2 mmol/L (ref 3.5–5.3)
PROTEIN TOTAL: 7.3 g/dL (ref 6.0–8.0)
SODIUM: 138 mmol/L (ref 135–153)

## 2024-09-18 LAB — CBC W/ AUTO DIFF
BASOPHILS ABSOLUTE COUNT: 0.1 10*9/L (ref 0.0–0.4)
BASOPHILS RELATIVE PERCENT: 0.7 %
EOSINOPHILS ABSOLUTE COUNT: 0.1 10*9/L (ref 0.0–0.7)
EOSINOPHILS RELATIVE PERCENT: 1.8 %
HEMATOCRIT: 48.5 % (ref 42.0–52.0)
HEMOGLOBIN: 15.3 g/dL (ref 14.0–18.0)
IMMATURE GRANULOCYTES ABSOLUTE COUNT: 0 10*9/L (ref 0.0–0.6)
IMMATURE GRANULOCYTES RELATIVE PERCENT: 0.3 %
LYMPHOCYTES ABSOLUTE COUNT: 0.7 10*9/L — ABNORMAL LOW (ref 1.2–3.4)
LYMPHOCYTES RELATIVE PERCENT: 9.9 %
MEAN CORPUSCULAR HEMOGLOBIN CONC: 31.5 g/dL (ref 31.0–35.5)
MEAN CORPUSCULAR HEMOGLOBIN: 28.3 pg (ref 26.0–34.0)
MEAN CORPUSCULAR VOLUME: 89.8 fL (ref 80.0–104.0)
MEAN PLATELET VOLUME: 10.6 fL — ABNORMAL HIGH (ref 7.0–10.0)
MONOCYTES ABSOLUTE COUNT: 0.8 10*9/L — ABNORMAL HIGH (ref 0.1–0.5)
MONOCYTES RELATIVE PERCENT: 10.6 %
NEUTROPHILS ABSOLUTE COUNT: 5.6 10*9/L (ref 1.4–6.5)
NEUTROPHILS RELATIVE PERCENT: 76.7 %
PLATELET COUNT: 277 10*9/L (ref 130–400)
RED BLOOD CELL COUNT: 5.4 10*12/L (ref 4.70–6.10)
RED CELL DISTRIBUTION WIDTH: 14.7 % (ref 11.5–15.5)
WBC ADJUSTED: 7.3 10*9/L (ref 4.8–10.8)

## 2024-09-18 LAB — TSH: THYROID STIMULATING HORMONE: 1.106 u[IU]/mL (ref 0.300–5.000)

## 2024-09-18 LAB — LIPID PANEL
CHOLESTEROL: 162 mg/dL (ref <200)
HDL CHOLESTEROL: 43 mg/dL (ref >40)
LDL CHOLESTEROL CALCULATED: 92 mg/dL
NON-HDL CHOLESTEROL: 119 mg/dL (ref <130)
TRIGLYCERIDES: 176 mg/dL — ABNORMAL HIGH (ref <150)

## 2024-09-18 NOTE — Progress Notes (Signed)
 [facility] Progress Note     Date:           09/18/2024   Provider:    Jacqualin Solders, Hanna  Patient:      Brett Hanna   MRN #:  [id number]  DOB:        [birth year]  AGE:          58 y.o.   SEX:          male    PCP: Brett Nephew, Hanna      Assessment/Plan:    Problem List Items Addressed This Visit          Other    Bronchiectasis without complication    (CMS-HCC) - Primary    Relevant Orders    Comprehensive Metabolic Panel    CBC w/ Differential    Hyperlipidemia    Relevant Orders    Hemoglobin A1c    TSH    Lipid Panel    Comprehensive Metabolic Panel    CBC w/ Differential    Type 2 diabetes mellitus without complication, without long-term current use of insulin  (CMS-HCC)    Relevant Orders    Hemoglobin A1c    TSH    Lipid Panel    Comprehensive Metabolic Panel    CBC w/ Differential     Other Visit Diagnoses         Flu vaccine need        Relevant Orders    INFLUENZA VACCINE IIV3(IM)(PF)6 MOS UP (Completed)            Brett Hanna was seen today for follow-up and flu vaccine.    Diagnoses and all orders for this visit:    Bronchiectasis without complication    (CMS-HCC)  -     Comprehensive Metabolic Panel; Future  -     CBC w/ Differential; Future    Type 2 diabetes mellitus without complication, without long-term current use of insulin  (CMS-HCC)  -     Hemoglobin A1c; Future  -     TSH; Future  -     Lipid Panel; Future  -     Comprehensive Metabolic Panel; Future  -     CBC w/ Differential; Future    Hyperlipidemia, unspecified hyperlipidemia type  -     Hemoglobin A1c; Future  -     TSH; Future  -     Lipid Panel; Future  -     Comprehensive Metabolic Panel; Future  -     CBC w/ Differential; Future    Flu vaccine need  -     INFLUENZA VACCINE IIV3(IM)(PF)6 MOS UP        Bronchiectasis with chronic infection and cough  - 08/16/2024 Sputum culture showed 4+ Pseudomonas and Acromobacter xylosoxidans.   - He continues to have chronic cough with increased frequency, producing yellow and greenish-yellow sputum. Wheezing exacerbated by weather changes. No recent fevers or chills.  - Patient inquired about stronger antibiotics to prevent bacteria growth and pneumonia. Educated patient about antibiotic resistance and informed patient he is close to antibiotic resistance.  - Continue azithromycin  for long term management and [name] for inflammation management  - Encouraged supportive care measures such as inhalers and nebulizers for wheezing.  - Administered flu vaccination.    General Health Maintenance  Up to date on pneumonia vaccination. Discussed flu vaccination; he opted to receive it despite concerns about interaction with azithromycin . Declined COVID vaccination.  - Administered flu shot.  -  Continue routine health maintenance.    Healthcare Maintenance:  Lung Ca Screening: 04/08/2022  Colorectal Ca Screening: 03/21/2015, 06/03/2023  Depression Screening: per PHQ-9 questionnaire assessment depression score is 0.     Diabetes Screening:  Last eye exam: 03/2023  Last foot exam: 05/04/2023     Vaccination:  COVID-19 Vaccine: 05/23/2020 04/25/2020, declined anymore shots  Influenza Vaccine: 09/07/2023, 09/18/2024  Pneumococcal Vaccine: 11/24/2013, 11/23/2017, patient states he was told he only needs 2 shots  Zoster Vaccine: declined  Tdap: 12/22/2008, 02/23/2024    Follow-up:   Return in about 3 months (around 12/19/2024) for Next scheduled follow up.  Ordered CBC, CMP, Lipid panel, TSH, A1c  Follow up in 3 months      Subjective:     Chief Complaint:   Chief Complaint   Patient presents with    Follow-up    Flu Vaccine         HPI:  Brett Corsino Sr. is a 58 y.o. male PMH of fatty liver, chronic sinusitis, asthma, alpha-1 antitrypsin deficiency carrier, bronchiectasis on 3-4 L home O2, OSA, HLD, NID-T2DM, GERD, anxiety, who presents to [facility] residency clinic for follow up.    He has been experiencing a chronic cough that has worsened over the past year. The cough occurs in spells two to three times a day, lasting up to three minutes each, and is productive of yellow-green sputum, especially when leaning over. Occasional wheezing is noted, particularly with weather changes. No fever, chills, chest pain, or congestion.  He recently saw Brett Hanna in [facility] and will follow up with Brett Hanna before the end of the year.     He has been on azithromycin  daily for the past two years to manage bacterial infections in his lungs, including Pseudomonas and Acromobacter xylosoxidans. He describes his lungs as 'very fertile' for bacterial growth and reports that he has needed a PICC line at times. He recently started a new medication, a lavender pill, for lung inflammation, which he began in September.    No recent fevers, chills, chest pain, or leg swelling. No significant changes in appetite, sleep, or physical activity. He is up to date on pneumonia vaccinations and typically receives a flu shot annually. He has concerns about interactions with azithromycin .    He has a history of sleep apnea and uses a CPAP machine, which he recently had billing issues with, but these have been resolved. He is also on Jardiance , which increases his urination frequency.    I have reviewed the medications, pertinent labs, past medical, surgical, family and social history for the patient.    Immunization History   Administered Date(s) Administered    COVID-19 VACCINE,MRNA(MODERNA)(PF) 04/25/2020, 05/23/2020    HEPATITIS B VACCINE ADULT,IM(ENERGIX B, RECOMBIVAX) 09/16/2010    Hep A / Hep B 08/26/2010    INFLUENZA INJ MDCK PF, QUAD,(FLUCELVAX)(38MO AND UP EGG FREE) 09/30/2017    INFLUENZA TIV (TRI) PF (IM)(HISTORICAL) 09/22/2010, 11/17/2011    INFLUENZA VACCINE IIV3(IM)(PF)6 MOS UP 09/07/2023, 09/18/2024    Influenza Vaccine Quad (IIV4 W/PRESERV) 38MO+ 09/26/2013    Influenza Vaccine Quad(IM)6 MO-Adult(PF) 08/15/2012, 07/20/2014, 08/14/2015, 09/22/2016, 09/06/2018, 09/08/2019, 08/20/2020, 08/08/2021, 11/17/2022 Influenza Virus Vaccine, unspecified formulation 08/16/2015, 10/08/2017    PNEUMOCOCCAL POLYSACCHARIDE 23-VALENT 07/10/2010, 11/23/2017    Pneumococcal Conjugate 13-Valent 11/24/2013    TdaP 12/22/2008, 02/23/2024       Health Maintenance Due   Topic Date Due    Zoster Vaccines (1 of 2) Never done    Pneumococcal Vaccine 50+ (  3 of 3 - PCV20 or PCV21) 11/23/2022    Urine Albumin/Creatinine Ratio  12/25/2022    Foot Exam  05/03/2024    COVID-19 Vaccine (3 - 2025-26 season) 07/03/2024    Hemoglobin A1c  08/24/2024       ROS:  Review of Systems   Constitutional:  Negative for appetite change, chills, diaphoresis, fatigue and fever.   HENT:  Negative for congestion, sinus pain and sore throat.    Eyes:  Negative for pain and discharge.   Respiratory:  Positive for cough (with yellow, green mucous), shortness of breath and wheezing.    Cardiovascular:  Negative for chest pain and palpitations.   Gastrointestinal:  Negative for abdominal pain, diarrhea, nausea and vomiting.   Genitourinary:  Negative for dysuria and hematuria.   Musculoskeletal:  Negative for back pain and neck pain.   Skin:  Negative for rash and wound.   Neurological:  Negative for dizziness, seizures, weakness, light-headedness, numbness and headaches.   Psychiatric/Behavioral:  Negative for hallucinations and suicidal ideas.          Allergies:  Allergies[1]    Past medical history: Past Medical History[2]    Surgical History:  Past Surgical History[3]    Family History: Family History[4]    Social History:  reports that he has never smoked. He has never been exposed to tobacco smoke. He quit smokeless tobacco use about 39 years ago.  His smokeless tobacco use included chew. He reports that he does not currently use alcohol . He reports that he does not use drugs.    Medications:  Current Medications[5]    Objective:     Vitals:    09/18/24 1000   BP: 164/83   BP Site: L Arm   BP Position: Sitting   Pulse: 89   Resp: 18   Temp: 37 ??C (98.6 ??F) TempSrc: Temporal   SpO2: 95%   Weight: 80.8 kg (178 lb 2 oz)   Height: 170.2 cm (5' 7)         Physical Exam  Vitals and nursing note reviewed.   Constitutional:       General: He is not in acute distress.     Appearance: Normal appearance. He is normal weight.      Interventions: Nasal cannula in place.      Comments: 2 L Hector in place   HENT:      Head: Normocephalic and atraumatic.      Right Ear: Tympanic membrane, ear canal and external ear normal.      Left Ear: Tympanic membrane, ear canal and external ear normal.      Mouth/Throat:      Mouth: Mucous membranes are moist.      Pharynx: Oropharynx is clear.   Eyes:      General: No scleral icterus.        Right eye: No discharge.         Left eye: No discharge.      Conjunctiva/sclera: Conjunctivae normal.   Cardiovascular:      Rate and Rhythm: Normal rate and regular rhythm.      Pulses: Normal pulses.      Heart sounds: Normal heart sounds. No murmur heard.  Pulmonary:      Effort: Pulmonary effort is normal. No respiratory distress.      Breath sounds: Rhonchi present.      Comments: Bilateral coarse breath sounds.  Abdominal:      General: Abdomen is flat. Bowel sounds are normal.  Palpations: Abdomen is soft.      Tenderness: There is no abdominal tenderness.   Musculoskeletal:         General: No deformity or signs of injury.      Cervical back: Normal range of motion and neck supple. No rigidity.      Right lower leg: No edema.      Left lower leg: No edema.   Skin:     General: Skin is warm and dry.      Coloration: Skin is not pale.   Neurological:      General: No focal deficit present.      Mental Status: He is alert and oriented to person, place, and time. Mental status is at baseline.   Psychiatric:         Attention and Perception: Attention normal.         Mood and Affect: Mood normal.         Speech: Speech normal.         Behavior: Behavior is cooperative.         Labs:  Results for orders placed or performed in visit on 08/16/24 Lower Respiratory Culture    Specimen: Sputum Expectorated   Result Value Ref Range    Lower Respiratory Culture 4+ Pseudomonas aeruginosa (A)     Lower Respiratory Culture 4+ Achromobacter xylosoxidans (A)     Lower Respiratory Culture <1+ Normal Flora     Gram Stain >25 White Blood Cells/LPF     Gram Stain <10  Epithelial cells/LPF     Gram Stain 3+ Gram positive cocci     Gram Stain 2+ Gram negative rods (bacilli)     Gram Stain Acceptable for culture        Susceptibility    Pseudomonas aeruginosa - MIC SUSCEPTIBILITY RESULT     MALDI  Bacterial Identification Performed      Aztreonam   Resistant      Cefepime  Resistant      Ceftazidime   Resistant      Ceftazidime -avibactam  Resistant      Ceftolozane-tazobactam  Resistant      Ciprofloxacin   Intermediate      Imipenem  Resistant      Levofloxacin   Intermediate      Meropenem  Resistant      Piperacillin + Tazobactam  Resistant      Tobramycin   Resistant      MCIM  Negative     Achromobacter xylosoxidans - MIC SUSCEPTIBILITY RESULT     MALDI  Bacterial Identification Performed      Aztreonam   Resistant      Cefepime  Resistant      Cefotaxime  Resistant      Ceftazidime   Resistant      Ceftriaxone  Susceptible      Ciprofloxacin   Susceptible      Gentamicin  Resistant      Levofloxacin   Susceptible      Meropenem  Susceptible      Piperacillin + Tazobactam  Susceptible      Tobramycin   Resistant      Trimethoprim  + Sulfamethoxazole   Susceptible    AFB culture    Specimen: Sputum Expectorated   Result Value Ref Range    AFB Culture       Negative to date - lab will update if status changes   AFB SMEAR    Specimen: Sputum Expectorated   Result Value Ref Range    AFB Smear  NO ACID FAST BACILLI SEEN- 3 negative smears Hanna not exclude pulmonary TB. If active pulmonary TB is suspected, continue airborne isolation until pulmonary disease is excluded by negative cultures.   AFB Mycobacterium TB Complex NAA    Specimen: Sputum Expectorated   Result Value Ref Range MTB NAAT Negative Negative     *Note: Due to a large number of results and/or encounters for the requested time period, some results have not been displayed. A complete set of results can be found in Results Review.       Attestation:     Portions of this note were dictated using Animal nutritionist.  It has been reviewed for accuracy, but may contain grammatical and clerical errors.    Electronically Signed by:    Brett Hanna  PGY-3 Internal Medicine  [facility]    09/18/24  12:50 PM    Attending Physician: Dr. Carlin Tanda Penton, MD          [1]   Allergies  Allergen Reactions    Codeine Shortness Of Breath and Rash    Rifampin      Fevers, chills.    Bamlanivimab  Other (See Comments)     Low Back pain    Daliresp [Roflumilast] Other (See Comments)     Back pain   [2]   Past Medical History:  Diagnosis Date    Allergic rhinitis     Alpha-1-antitrypsin deficiency carrier     with slightly low levels    Amblyopia     Asthma (HHS-HCC)     Bronchiectasis    (CMS-HCC) 06/12/2011    Chronic sinusitis     Colon polyps     COVID-19 10/24/2019    Diabetes mellitus    (CMS-HCC)     GERD (gastroesophageal reflux disease)     H/O Mycobacterium avium complex infection     Impaired glucose tolerance     Obesity     Pseudomonas respiratory infection     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    Sleep apnea with use of continuous positive airway pressure (CPAP)     Strabismus     Strep throat    [3]   Past Surgical History:  Procedure Laterality Date    BRONCHOSCOPY      LUNG BIOPSY      PR BRONCHOSCOPY,DIAGNOSTIC W BRUSH Bilateral 08/21/2013    Procedure: BRONCHOSCOPY, RIGID OR FLEXIBLE, INCLUDING FLOURO GUIDED; DIAGNOSTIC, WITH BRUSHING OR PROTECTED BRUSHINGS;  Surgeon: Ronal LITTIE Kipper, MD;  Location: [facility] PROCEDURE LAB [facility];  Service: Pulmonary    PR BRONCHOSCOPY,DIAGNOSTIC W LAVAGE Bilateral 08/21/2013    Procedure: BRONCHOSCOPY, RIGID OR FLEXIBLE, INCLUDE FLUOROSCOPIC GUIDANCE WHEN PERFORMED; W/BRONCHIAL ALVEOLAR LAVAGE;  Surgeon: Ronal LITTIE Kipper, MD;  Location: [facility] PROCEDURE LAB [facility];  Service: Pulmonary    PR COLONOSCOPY FLX DX W/COLLJ SPEC WHEN PFRMD N/A 03/21/2015    Procedure: COLONOSCOPY, FLEXIBLE, PROXIMAL TO SPLENIC FLEXURE; DIAGNOSTIC, W/WO COLLECTION SPECIMEN BY BRUSH OR WASH;  Surgeon: Lauraine MARLA Dry, MD;  Location: [facility] PROC[facility]URES [facility];  Service: Gastroenterology    PR COLONOSCOPY FLX DX W/COLLJ SPEC WHEN PFRMD N/A 06/03/2023    Procedure: COLONOSCOPY, FLEXIBLE, PROXIMAL TO SPLENIC FLEXURE; DIAGNOSTIC, W/WO COLLECTION SPECIMEN BY BRUSH OR WASH;  Surgeon: Sue Sim Legions, MD;  Location: GI PROC[facility]URES [facility];  Service: Gastroenterology    PR UPPER GI ENDOSCOPY,DIAGNOSIS N/A 06/03/2023    Procedure: UGI ENDO, INCLUDE ESOPHAGUS, STOMACH, & DUODENUM &/OR JEJUNUM; DX W/WO COLLECTION SPECIMN, BY BRUSH OR  [facility];  Surgeon: Sue Sim Legions, MD;  Location: GI PROC[facility]URES [facility];  Service: Gastroenterology    PR XCAPSL CTRC RMVL INSJ IO LENS PROSTH W/O ECP Right 06/08/2023    Procedure: EXTRACAPSULAR CATARACT REMOVAL W/INSERTION OF INTRAOCULAR LENS PROSTHESIS, MANUAL OR MECHANICAL TECHNIQUE WITHOUT ENDOSCOPIC CYCLOPHOTOCOAGULATION;  Surgeon: Ranjan, Sinthu Sivarama, MD;  Location: [facility] PROCEDURES AREA;  Service: Ophthalmology    PR XCAPSL CTRC RMVL INSJ IO LENS PROSTH W/O ECP Left 02/16/2024    Procedure: EXTRACAPSULAR CATARACT REMOVAL W/INSERTION OF INTRAOCULAR LENS PROSTHESIS, MANUAL OR MECHANICAL TECHNIQUE WITHOUT ENDOSCOPIC CYCLOPHOTOCOAGULATION;  Surgeon: Edmon All, MD;  Location: [facility];  Service: Ophthalmology    SHOULDER SURGERY Right    [4]   Family History  Problem Relation Age of Onset    No Known Problems Mother     Colon cancer Father     Alpha-1 antitrypsin deficiency Sister     No Known Problems Brother     Diabetes Paternal Grandfather     Emphysema Maternal Uncle     Heart disease Maternal Uncle    [5]   Current Outpatient Medications: acetaminophen (TYLENOL) 500 MG tablet, Take 1 tablet by mouth as needed for pain, Disp: , Rfl:     albuterol  2.5 mg /3 mL (0.083 %) nebulizer solution, Inhale the contents of 1 vial (3 ml) via nebulizer twice daily and every 6 hours as needed for shortness of breath and wheezing, Disp: 360 mL, Rfl: 5    albuterol  HFA 90 mcg/actuation inhaler, Inhale 2 puffs every four (4) hours as needed., Disp: 54 g, Rfl: 2    atorvastatin  (LIPITOR ) 20 MG tablet, TAKE 1 TABLET EVERY EVENING, Disp: 90 tablet, Rfl: 3    azithromycin  (ZITHROMAX ) 250 MG tablet, Take 1 tablet (250 mg total) by mouth daily., Disp: 90 tablet, Rfl: 3    aztreonam  lysine  (CAYSTON ) 75 mg/mL Nebu nebulization solution, Inhale 1 mL (75 mg total) by nebulization every eight (8) hours. Nebulize every other month after airway clearance using Altera nebulizer., Disp: 84 mL, Rfl: 3    brensocatib ([name]) 25 mg tablet, Take 1 tablet (25 mg total) by mouth daily., Disp: 90 tablet, Rfl: 3    budesonide -glycopyr-formoterol (BREZTRI  AEROSPHERE) 160-9-4.8 mcg/actuation HFAA, Inhale 2 puffs two (2) times a day., Disp: 32.1 g, Rfl: 8    carboxymethylcellulose sodium (THERATEARS) 0.25 % Drop, Administer 1 drop to both eyes four (4) times a day as needed., Disp: , Rfl:     cetirizine  (ZYRTEC ) 10 MG tablet, Take 1 tablet (10 mg total) by mouth daily., Disp: , Rfl:     cholecalciferol , vitamin D3, (VITAMIN D3) 2,000 unit cap, Take 1 capsule (2,000 Units total) by mouth daily., Disp: 30 capsule, Rfl: prn    empagliflozin  (JARDIANCE ) 25 mg tablet, Take 1 tablet (25 mg total) by mouth daily., Disp: , Rfl:     fexofenadine  (ALLEGRA ) 180 MG tablet, Take 1 tablet (180 mg total) by mouth. Sometimes alternates with cetirizine , Disp: , Rfl:     fluticasone  propionate (FLONASE ) 50 mcg/actuation nasal spray, 2 sprays into each nostril daily., Disp: 16 g, Rfl: 0    lancets Misc, Test once daily before breakfast., Disp: 100 each, Rfl: 3    metFORMIN  (GLUCOPHAGE -XR) 500 MG 24 hr tablet, TAKE 4 TABLETS EVERY DAY WITH EVENING MEAL, Disp: 360 tablet, Rfl: 3    MISCELLANEOUS MEDICAL SUPPLY MISC, Frequency:PHARMDIR   Dosage:0.0     Instructions:  Note:CPAP 15 cm H20 with heated humidity for nasal dryness.  Mask: ResMed Quattro size Medium. Dx OSA. Dose: 1, Disp: , Rfl:     montelukast  (SINGULAIR ) 10 mg tablet, Take 1 tablet (10 mg total) by mouth nightly., Disp: 90 tablet, Rfl: 3    nebulizers (ALTERA NEBULIZER HANDSET) Misc, Please use with [place] , Disp: 1 each, Rfl: 11    nebulizers (ALTERA NEBULIZER SYSTEM) Misc, Please use with [place]  medication., Disp: 1 each, Rfl: 0    nebulizers (LC PLUS) Misc, USE AS DIRECTED with inhaled medications, Disp: 1 each, Rfl: 5    nebulizers Misc, Use with inhaled medication., Disp: 3 each, Rfl: 3    needle, disp, 21 G 21 gauge x 1 1/2 Ndle, Use to draw up inject normal saline into colistin vial and to withdraw colistin dose for inhalation, Disp: 100 each, Rfl: 5    omeprazole  (PRILOSEC) 20 MG capsule, TAKE 1 CAPSULE TWICE DAILY, Disp: 180 capsule, Rfl: 3    sod bicarb-sod chlor-neti pot pkdv, Use twice per day, Disp: , Rfl:     sodium chloride  7% 7 % Nebu, Inhale 1 vial (4 mL) by nebulization Two (2) times a day., Disp: 720 mL, Rfl: 3    thiamine HCl, vitamin B1, (VITAMIN B-1) 250 MG tablet, Take 1 tablet (250 mg total) by mouth daily., Disp: , Rfl:

## 2024-09-21 DIAGNOSIS — Z2239 Carrier of other specified bacterial diseases: Principal | ICD-10-CM

## 2024-09-21 DIAGNOSIS — J9611 Chronic respiratory failure with hypoxia: Principal | ICD-10-CM

## 2024-09-21 DIAGNOSIS — J4550 Severe persistent asthma, uncomplicated: Principal | ICD-10-CM

## 2024-09-21 DIAGNOSIS — J471 Bronchiectasis with (acute) exacerbation: Principal | ICD-10-CM

## 2024-09-21 NOTE — Progress Notes (Signed)
 Patient ID: Brett Traynham Sr., date of birth 02/02/1966 is a 58 y.o. male.    Date: 09/21/24     Chief Complaint:   Chief Complaint   Patient presents with    Follow-up        Allergies:   Allergies as of 09/21/2024 - Reviewed 09/21/2024   Allergen Reaction Noted    Codeine Shortness Of Breath and Rash 02/07/2013    Rifampin  05/08/2016    Bamlanivimab  Other (See Comments) 09/15/2020    Daliresp [roflumilast] Other (See Comments) 06/26/2014       Past Medical History:   Patient Active Problem List   Diagnosis    Allergic rhinitis    Alpha-1-antitrypsin deficiency carrier (MZ)    Benign neoplasm of colon    Bronchiectasis without complication    (CMS-HCC)    Hyperlipidemia    Obstructive sleep apnea syndrome    Chronic sinusitis    Vitamin D deficiency    Type 2 diabetes mellitus without complication, without long-term current use of insulin  (CMS-HCC)    Uncomplicated severe persistent asthma    (CMS-HCC)    Overweight    Migraine with aura and without status migrainosus, not intractable    Gastroesophageal reflux disease without esophagitis    Flexural eczema    Anxiety    Fatty liver    Burkholderia gladioli culture positive    Pseudomonas aeruginosa colonization    Chronic respiratory failure with hypoxia    (CMS-HCC)    Cataract    Senile nuclear cataract, left       Referring Physician:  Hok, Eveline, DO Hok, Eveline, DO     HPI:    58 y.o. male  back at the Pulmonary & Sleep office for evaluation.  Patient was seen for evaluation of COPD/severe bronchiectasis.  Previously seen by [facility] pulmonology by Dr. Blondie, reportedly with   bronchiectasis secondary to alpha-1 antitrypsin carrier state ( MZ phenotype) with prior NTM infection status post treatment, OSA, colonization with PsA, prior colonization with B gladioli and Scedosporium, and chronic hypoxemic respiratory failure.  PFTs demonstrate both restriction and chronic obstruction.    Patient previously also on treatment for MAC, chronic Pseudomonas infection with aztreonam  anthramycin nebulizations.    Severe OSA,on CPAP at nighttime .    Back at the clinic for follow-up evaluation.  He has been seen by Dr. Blondie during the summer and patient being on nebulized aztreonam  for Pseudomonas,Aerobika device for flutter treatment, and apparently recently started on the new anti-inflammatory agent-[name].    Still on azithromycin  250 daily, Breztri  inhaler with spacer    Patient reports cough and expectoration.  Sputum noted to be usually yellowish and varies in amount.  Sometimes has buildup of congestion for which she has to use his nebulizer more often.  No fever reported.  No hemoptysis.  Otherwise appears to be doing much better with Breztri               Tobacco Use: Medium Risk (09/21/2024)    Patient History     Smoking Tobacco Use: Never     Smokeless Tobacco Use: Former     Passive Exposure: Never         Pets:     Occupation:       SL 5 minutes                           ~ 0 min  naps    Denies any  parasomnias      Investigations:      Cultures    - Sputum 11/28/22, 08/18/2022,06/18/2022 -mucoid: Small +3 Pseudomonas aeruginosa  -Fungal culture -Aspergillus flavus (06/18/2022 ) terreus (08/18/2022)  -AFB -  MAC 06/18/2022,10/28/2021 X 2 at this clinic and history please see if she is downloaded and she will be      Alpha 1 AT- MZ phenotype     RAST- IgE 12/13/2020-   IgE   TOTAL, 22           Aspergillus IgE less than 0.35     PFT-               58 yr old man with Bronchiectasis. BMI 29.  Forced inspiratory F-V curve shows multiple small flow irregularities, PIFof  69.5% is within normal limits.  Forced expiratory F-V curve suggests restrictive change. FVC of 2.02 L is  49%. FEV1 of 1.62 L is 50%. FEV1/FVC ratio of 0.80 is normal. FEF25-75 is at  LLN (52%). PEF of 6.17 L/sec is 72%.  FETime is 7.5 sec.  Plethysmography: Raw 127%. sRaw 110%.  VC 2.17 L (52%). TLC of 4.66 L is 74%. RV is 112%. RV/TLC ratio is increased  at 0.54.  FRC is 86%. ERV is 34%. IC is 62%.  The current FVC of 2.02 L is the lowest yet recorded. A sharp reduction of  FVC was recorded on 01/09/2020 on which date FVC was 2.30 L so that the  further decrease to the current 2.02 L has been a very slow process. FEV1  was also reduced on 01/09/2020 to 1.84 L so the subsequent decrease in FEV1  has also been relatively slow.    IMPRESSION: Slowly progressive restrictive lung disease. No measures of  DLCO are recorded.  The RV and RV/TLC ratio suggests that there is also some degree of gas  trapping although there is no clear indication of obstructive ventilatory  impairment..  (Physician 08/25/2022 04:18PM, Electronically signed by Aleene Rise, MD / Final: 08/25/2022 04:18PM, Electronically signed by Aleene Rise,  MD)  SIGN! SIGN !! my IMPRESSION: Thank you. MYRTIS Rise MD  (Physician 08/25/2022 04:20PM, Electronically signed by Aleene Rise, MD / Final: 08/25/2022 04:20PM, Electronically signed by Aleene Rise,  MD)          No results found for: PHART, PO2ART, PCO2ART, O2SATART        6-minute walk test-         No data to display                   Overnight oximetry/NDS-  N/A      Chest x-rays-   N/A      Chest CT-  04/08/2022 -         Impression      --Interval worsening of multifocal lower bronchiectasis, bronchial wall thickening, peribronchial consolidation and tree-in-bud nodules with new peripheral nodular airspace disease in right upper lobe and superior segment of left lower lobe, findings consistent with worsening of bronchiectasis and associated bronchiolitis (consistent with patient's known history of alpha-1 antitrypsin deficiency).       --Prominent mediastinal lymph nodes, likely reactive, similar to prior.      --Distended and fluid-filled distal thoracic esophagus, patient is at increased risk for aspiration as well.         Echo-   Summary    1. Technically difficult study.    2. The left ventricle is normal in size with normal wall thickness.  3. The left ventricular systolic function is normal, LVEF is visually  estimated at > 55%.    4. There is mild mitral valve regurgitation.    5. The right ventricle is normal in size, with normal systolic function.    6. Agitated saline study is negative, however visualization is somewhat  limited.        Sleep:    Apparently on CPAP therapy since 2013.  Initial titration study showed an AHI of 68 events per hour on CPAP 6 cm H2O, per notes     Impression  CPAP 13 best treated apnea and was tested in NREM supine sleep. Throughout the titration whenever the patient moved onto his back, he had mixed and periodic breathing with frequent apneas, but these might have been somewhat improved at higher pressures.  He might benefit from a slightly higher pressure than the final pressure to help with treatment on his back.     Recommendations  CPAP 14 cm H20 with heated humidity for nasal dryness.  Mask: Res Med Mirage Qauttro, size medium (patient is already using this mask).  This should be combined with strict lateral sleep positioning (avoidance of sleeping on his back)  Continue weight loss efforts.  Aggressive treatment of any nasal rhinitis since this can worsen apnea and interfere with therapy.  The patient should also be reminded that respiratory suppressant substances can aggravate apnea and should be avoided. These include, but are not limited to, opioid analgesics, muscle relaxants, and benzodiazepines.        _______________________________M.D.  Heidi L. Madelyn, MD   Diplomate of Sleep Medicine, ABPN       ---               COPD Assessment Test:  Cough: 5   Phlegm: 5   Chest Tightness: 0   Dyspnea: 2  Activity Level: 3 (without 02)  Confidence: 0  Sleep: 5  Energy: 0  CAT Total: 20              Asthma Control Test:                                   ESS-  Last Epworth Sleepiness Scale Result:                                     FOSQ-                    VACCINES:  Immunization History   Administered Date(s) Administered COVID-19 VACCINE,MRNA(MODERNA)(PF) 04/25/2020, 05/23/2020    HEPATITIS B VACCINE ADULT,IM(ENERGIX B, RECOMBIVAX) 09/16/2010    Hep A / Hep B 08/26/2010    INFLUENZA INJ MDCK PF, QUAD,(FLUCELVAX)(64MO AND UP EGG FREE) 09/30/2017    INFLUENZA TIV (TRI) PF (IM)(HISTORICAL) 09/22/2010, 11/17/2011    INFLUENZA VACCINE IIV3(IM)(PF)6 MOS UP 09/07/2023, 09/18/2024    Influenza Vaccine Quad (IIV4 W/PRESERV) 64MO+ 09/26/2013    Influenza Vaccine Quad(IM)6 MO-Adult(PF) 08/15/2012, 07/20/2014, 08/14/2015, 09/22/2016, 09/06/2018, 09/08/2019, 08/20/2020, 08/08/2021, 11/17/2022    Influenza Virus Vaccine, unspecified formulation 08/16/2015, 10/08/2017    PNEUMOCOCCAL POLYSACCHARIDE 23-VALENT 07/10/2010, 11/23/2017    Pneumococcal Conjugate 13-Valent 11/24/2013    TdaP 12/22/2008, 02/23/2024            ROS:   Review of Systems:   Allergy  and Immunology ROS: normal  Respiratory ROS: see HPI  Cardiovascular ROS: no chest pain,   Gastrointestinal ROS: no abdominal pain, no change in bowel habits, no black or bloody stools  Neurological ROS: no TIA, no stroke symptoms    EXAM:   Vitals:     There were no vitals filed for this visit.        Wt Readings from Last 3 Encounters:   09/18/24 80.8 kg (178 lb 2 oz)   04/04/24 80.3 kg (177 lb)   02/23/24 80.9 kg (178 lb 4 oz)     There is no height or weight on file to calculate BMI.    Gen: no distress, no retractions  HEENT: PERRLA,Mallampati 4.  No sinus tenderness  Neck: supple, no JVD, no thyromegaly  Pulm: Sporadic bilateral rhonchi.  No wheezing    Heart: RRR, no murmur, no gallop, no rub  ABD: S&D, no tenderness, no rebound  Ext: +pulses, no cyanosis  Neuro: A&O x 3, grossly nonfocal  Skin: no obvious rashes, no ulcers  Psych: normal mood. affect     Pulse Oximetry x 3  SpO2 Readings from Last 3 Encounters:   09/18/24 95%   04/04/24 94%   02/23/24 95%           LABS        Lab Results   Component Value Date    WBC 7.3 09/18/2024    HGB 15.3 09/18/2024    HCT 48.5 09/18/2024    PLT 277 09/18/2024       Lab Results   Component Value Date    NA 138 09/18/2024    K 4.2 09/18/2024    CL 104 09/18/2024    CO2 27.0 09/18/2024    BUN 22 (H) 09/18/2024    CREATININE 0.70 09/18/2024    GLU 89 09/18/2024    CALCIUM 9.7 09/18/2024    MG 2.0 04/09/2023    PHOS 4.4 04/09/2023       Lab Results   Component Value Date    BILITOT 0.5 09/18/2024    BILIDIR 0.10 04/21/2023    PROT 7.3 09/18/2024    ALBUMIN 4.3 09/18/2024    ALT 27 09/18/2024    AST 17 09/18/2024    ALKPHOS 95 09/18/2024    GGT 41 01/06/2013       Lab Results   Component Value Date    PT 11.1 08/20/2020    INR 0.95 08/20/2020    APTT 31.2 06/28/2012           ASSESSMENT AND PLAN:     There are no diagnoses linked to this encounter.      @EDCI @  Discontinued Medications    No medications on file        Recommendations:    Discussed and reviewed all findings with the patient in detail  Discussed plan of care with the patient, complex care requiring overall care current condition and associated complexities with patient diagnosis.    Plan of care , follow-up reviewed with patient in detail and all questions answered.             Regarding sleep apnea AHI/RDI is therapeutic.  He is out of supplies and will enter a new order for CPAP supplies for him.    Multiple questions answered regarding his ongoing treatment for bronchiectasis.  I mention to him that we will have to see how the new anti-inflammatory treatment is going to work and he is to continue his nebulizer treatment.    Of note he is just  using his best treatment twice a day advised him to use best treatment together with his nebulizer therapy 3 times a day and monitor response.      No orders of the defined types were placed in this encounter.        No follow-ups on file.    @ESIG @   Westley Bally, MD FCCP    This care is provided during an unprecedented national emergency due to the Novel Coronavirus (COVID-19). COVID-19 infections and transmission risks place heavy strains on healthcare resources. As this pandemic evolves, the Hospital and providers strive to respond fluidly, to remain operational, and to provide care relative to available resources and information. Outcomes are unpredictable and treatments are without well-defined guidelines. Further, the impact of COVID-19 on all aspects of emergency care, including the impact to patients seeking care for reasons other than COVID-19, is unavoidable during this national emergency.      Parts of this note have been dictated using voice recognition software. There may be variances in spelling and vocabulary which are unintentional. Not all errors are proofread. Please notify the dino if any discrepancies are noted or if the meaning of any statement is not clear.

## 2024-09-22 NOTE — Telephone Encounter (Signed)
 Sent requested, signed prescription to [facility] for Cpap Supplies.

## 2024-10-06 NOTE — Progress Notes (Signed)
 [facility] Specialty and Home Delivery Pharmacy Refill Coordination Note    Specialty Medication(s) to be Shipped:   CF/Pulmonary/Asthma: [name]    Other medication(s) to be shipped: No additional medications requested for fill at this time    Specialty Medications not needed at this time: N/A     Gardiner Espana Sr., DOB: 1965-12-10  Phone: There are no phone numbers on file.      All above HIPAA information was verified with patient's family member, wife.     Was a nurse, learning disability used for this call? No    Completed refill call assessment today to schedule patient's medication shipment from the [facility] and Home Delivery Pharmacy  [phone]).  All relevant notes have been reviewed.     Specialty medication(s) and dose(s) confirmed: Regimen is correct and unchanged.   Changes to medications: Arnol reports no changes at this time.  Changes to insurance: No  New side effects reported not previously addressed with a pharmacist or physician: None reported  Questions for the pharmacist: No    Confirmed patient received a Conservation Officer, Historic Buildings and a Surveyor, Mining with first shipment. The patient will receive a drug information handout for each medication shipped and additional FDA Medication Guides as required.       DISEASE/MEDICATION-SPECIFIC INFORMATION        N/A    SPECIALTY MEDICATION ADHERENCE     Medication Adherence    Patient reported X missed doses in the last month: 0  Specialty Medication: [name] 25 mg tablet (brensocatib)  Patient is on additional specialty medications: No  Support network for adherence: family member              Were doses missed due to medication being on hold? No      [name] 25 mg tablet (brensocatib): 12 doses of medicine on hand       REFERRAL TO PHARMACIST     Referral to the pharmacist: Not needed      [facility]     Shipping address confirmed in Epic.     Cost and Payment: Patient has a $0 copay, payment information is not required.    Delivery Scheduled: Yes, Expected medication delivery date: 12.10.25.     Medication will be delivered via UPS to the prescription address in Epic WAM.    Doyal Hurst   [facility] Specialty and Home Delivery Pharmacy  Specialty Technician

## 2024-10-10 MED FILL — BRINSUPRI 25 MG TABLET: ORAL | 30 days supply | Qty: 30 | Fill #2

## 2024-10-18 DIAGNOSIS — J479 Bronchiectasis, uncomplicated: Principal | ICD-10-CM

## 2024-10-18 MED ORDER — ALBUTEROL SULFATE HFA 90 MCG/ACTUATION AEROSOL INHALER
RESPIRATORY_TRACT | 3 refills | 0.00000 days | Status: CP
Start: 2024-10-18 — End: ?

## 2024-10-19 MED ORDER — EMPAGLIFLOZIN 25 MG TABLET
ORAL_TABLET | Freq: Every day | ORAL | 3 refills | 90.00000 days | Status: CP
Start: 2024-10-19 — End: ?

## 2024-10-19 NOTE — Telephone Encounter (Signed)
 PLEASE SEND RX TO PHARMACY CARE CLINIC

## 2024-11-06 DIAGNOSIS — J479 Bronchiectasis, uncomplicated: Principal | ICD-10-CM

## 2024-11-06 DIAGNOSIS — K219 Gastro-esophageal reflux disease without esophagitis: Principal | ICD-10-CM

## 2024-11-06 MED ORDER — METFORMIN ER 500 MG TABLET,EXTENDED RELEASE 24 HR
ORAL_TABLET | Freq: Every day | ORAL | 3 refills | 90.00000 days | Status: CP
Start: 2024-11-06 — End: ?

## 2024-11-06 MED ORDER — EMPAGLIFLOZIN 25 MG TABLET
ORAL_TABLET | Freq: Every day | ORAL | 3 refills | 90.00000 days | Status: CP
Start: 2024-11-06 — End: ?

## 2024-11-06 MED ORDER — ALBUTEROL SULFATE HFA 90 MCG/ACTUATION AEROSOL INHALER
Freq: Four times a day (QID) | RESPIRATORY_TRACT | 3 refills | 0.00000 days | Status: CP | PRN
Start: 2024-11-06 — End: ?

## 2024-11-06 MED ORDER — ATORVASTATIN 20 MG TABLET
ORAL_TABLET | Freq: Every evening | ORAL | 3 refills | 90.00000 days | Status: CP
Start: 2024-11-06 — End: ?

## 2024-11-06 MED ORDER — OMEPRAZOLE 20 MG CAPSULE,DELAYED RELEASE
ORAL_CAPSULE | Freq: Every day | ORAL | 3 refills | 180.00000 days | Status: CP
Start: 2024-11-06 — End: ?

## 2024-11-06 NOTE — Telephone Encounter (Signed)
 [facility]

## 2024-11-10 DIAGNOSIS — J471 Bronchiectasis with (acute) exacerbation: Principal | ICD-10-CM

## 2024-11-10 DIAGNOSIS — K219 Gastro-esophageal reflux disease without esophagitis: Principal | ICD-10-CM

## 2024-11-10 DIAGNOSIS — E119 Type 2 diabetes mellitus without complications: Principal | ICD-10-CM

## 2024-11-10 DIAGNOSIS — E785 Hyperlipidemia, unspecified: Principal | ICD-10-CM

## 2024-11-10 MED ORDER — METFORMIN ER 500 MG TABLET,EXTENDED RELEASE 24 HR
ORAL_TABLET | Freq: Every day | ORAL | 3 refills | 90.00000 days | Status: CP
Start: 2024-11-10 — End: ?

## 2024-11-10 MED ORDER — OMEPRAZOLE 20 MG CAPSULE,DELAYED RELEASE
ORAL_CAPSULE | Freq: Every day | ORAL | 3 refills | 180.00000 days | Status: CP
Start: 2024-11-10 — End: ?

## 2024-11-10 MED ORDER — ATORVASTATIN 20 MG TABLET
ORAL_TABLET | Freq: Every evening | ORAL | 3 refills | 90.00000 days | Status: CP
Start: 2024-11-10 — End: ?

## 2024-11-10 MED ORDER — ALBUTEROL SULFATE 2.5 MG/3 ML (0.083 %) SOLUTION FOR NEBULIZATION
RESPIRATORY_TRACT | 6 refills | 20.00000 days | Status: CP | PRN
Start: 2024-11-10 — End: ?

## 2024-11-10 MED ORDER — EMPAGLIFLOZIN 25 MG TABLET
ORAL_TABLET | Freq: Every day | ORAL | 3 refills | 90.00000 days | Status: CP
Start: 2024-11-10 — End: ?

## 2024-11-10 NOTE — Telephone Encounter (Signed)
 [facility]

## 2024-11-21 DIAGNOSIS — J479 Bronchiectasis, uncomplicated: Principal | ICD-10-CM

## 2024-11-21 DIAGNOSIS — J9611 Chronic respiratory failure with hypoxia: Principal | ICD-10-CM

## 2024-11-21 DIAGNOSIS — H938X3 Other specified disorders of ear, bilateral: Principal | ICD-10-CM

## 2024-11-21 DIAGNOSIS — Z760 Encounter for issue of repeat prescription: Principal | ICD-10-CM

## 2024-11-21 DIAGNOSIS — H906 Mixed conductive and sensorineural hearing loss, bilateral: Principal | ICD-10-CM

## 2024-11-21 DIAGNOSIS — H65491 Other chronic nonsuppurative otitis media, right ear: Principal | ICD-10-CM

## 2024-11-21 DIAGNOSIS — J4550 Severe persistent asthma, uncomplicated: Principal | ICD-10-CM

## 2024-11-21 MED ORDER — NEBULIZER ACCESSORIES KIT
PACK | 3 refills | 0.00000 days | Status: CP
Start: 2024-11-21 — End: ?

## 2024-11-21 MED ORDER — AZITHROMYCIN 250 MG TABLET
ORAL_TABLET | Freq: Every day | ORAL | 3 refills | 90.00000 days | Status: CP
Start: 2024-11-21 — End: 2025-11-21

## 2024-11-21 MED ORDER — SODIUM CHLORIDE 7 % FOR NEBULIZATION
Freq: Two times a day (BID) | RESPIRATORY_TRACT | 3 refills | 90.00000 days | Status: CP
Start: 2024-11-21 — End: 2025-11-21

## 2024-11-21 MED ORDER — FLUTICASONE PROPIONATE 50 MCG/ACTUATION NASAL SPRAY,SUSPENSION
Freq: Every day | NASAL | 6 refills | 60.00000 days | Status: CP
Start: 2024-11-21 — End: ?

## 2024-11-21 MED ORDER — ALBUTEROL SULFATE HFA 90 MCG/ACTUATION AEROSOL INHALER
Freq: Four times a day (QID) | RESPIRATORY_TRACT | 3 refills | 0.00000 days | Status: CP | PRN
Start: 2024-11-21 — End: ?

## 2024-11-21 NOTE — Telephone Encounter (Signed)
 His PCP's office sent the albuterol  solution to [facility] and the albuterol  inhaler to [facility] on 11/10/24.  His local pulmonologist had sent the azithromycin  and hypertonic saline to [facility] in February.  He last filled the hypertonic saline in February from what I see.      I sent the azithromycin  and albuterol  HFA to [facility] but the hypertonic saline is less expensive through [facility].  Can you let him know that I sent the hypertonic saline to us ?

## 2024-11-21 NOTE — Progress Notes (Signed)
 Otolaryngology Return Visit    HPI:  Brett Guiney Sr. is a 59 y.o. male is seen in consultation at the request of Kerri Amor, MD, for evaluation of ear infection.  His medical history is significant for right ear infection with onset  approximately 1 month ago. Today, patient reports treatment with prednisone  ended a couple weeks ago but he continues to experience hearing loss, aural fullness and tinnitus on right.     After the onset of fullness and discomfort, he presented to his PCP and received augmentin  and a 3 day course of prednisone .  He was then seen back and infection seemed better but drum was retracted.  He received 5 more days of prednisone  and referred here due to continued symptoms.  He states that he continues to have some decreased hearing and feels like he is in a barrel.  He has occasionally been hearing some popping with intermittent improvement in hearing.  No otorrhea.  No major previous ear issues or ear surgery.    He has sinus issues and uses saline irrigations a couple times a week.  Was switched from flonase  to budesonide  due to his oxygen  requirement.     11/23/22- Was last seen a couple months ago with right sided effusion after acute otitis media.  He increased his rinses to about every other day.  He also used Afrin for several courses.  He feels like this helped improve his symptoms temporarily, but then symptoms recur after a few weeks.  Has right-sided aural fullness and decreased hearing.     02/01/23- Last visit had ear tube placed on right.  He has noted improvement in hearing as well as ear fullness since that time.  Has some mild drainage in the morning but none consistently.    03/02/23- Roughly 2-3 weeks ago he woke with with left sided aural fullness and muffled hearing.  This has been worsening.  He has continued with irrigations and sprays.    04/28/23- Last visit had left myringotomy, with improvement in hearing. However, later that night, his left ear closed up again with buildup of pressure. Today, he reports that last week, his right ear also felt clogged with decrease hearing. Had recent hospitalization for lung exacerbation.    09/08/23- Last visit had bilateral ear tubes placed.  Doing well with major pain or drainage.  Hearing is improved.  Has some mild intermittent sharp tenderness on the right side.    03/07/24- Patient reports doing well with no major hearing concerns today. Denies any otalgia, otorrhea, or otitis media.     11/21/24- The patient presents for a follow-up today. Reports doing well with from an ear standpoint recently.  Had some short lived fullness but this resolved. He continues to use nasal rinses and Flonase  with benefit.     Exam:  General: well appearing, stated age, no distress   Head - atraumatic, normocephalic   Nose: dorsum midline, no rhinorrhea  Neck: symmetric, trachea midline  Psychiatric: alert and oriented, appropriate mood and affect   Respiratory: no audible wheezing or stridor, normal work of breathing  Neurologic - cranial nerves 2-12 grossly intact  Facial Strength - HB 1/6 bilaterally    Ears - External ear- normal, no lesions, no malformations   Otoscopy - Left EAC clear with TM intact. Right EAC clear. TM intact with well aerated middle ear space and slight surrounding sclerosis.   Tuning fork test - N/A    Assessment/Plan:  Brett Harman Billing Sr. is  a 59 y.o. male with a history of bilateral chronic dyspnea with effusion and associated conductive hearing loss s/p ear tube placement on 04/28/23. He is doing well; ear tubes are now extruded with well aerated middle ear space. He'll continue with his nasal rinses and Flonase  to use on a consistent basis. We can plan to see him back in six months or sooner if he notices fluid buildup that is not resolved and we can consider repeat tube placement.     The patient/family voiced understanding of the plan as detailed above and is in agreement. I appreciate the opportunity to participate in his care.      Mabel PARAS. Sebastian, MD obtained and performed the history, physical exam and medical decision making elements that were entered into the chart. Signed by Verdon Mani, Medical Scribe, on November 21, 2024 at 10:22 AM.    November 21, 2024, 10:22 AM. Documentation assistance provided by the Scribe. I was present during the time the encounter was recorded. The information recorded by the Scribe was done at my direction and has been reviewed and validated by me.     I attest to the above information and documentation. However, this note has been created using voice recognition software and may have errors that were not dictated and not seen in editing.

## 2024-11-21 NOTE — Telephone Encounter (Signed)
FLONASE

## 2024-11-21 NOTE — Telephone Encounter (Signed)
 Spouse called in and left a message stating the patients needed his albuterol  and sodium chloride  nebulizer meds, azithromycin , and albuterol  inhaler [facility].

## 2024-11-22 DIAGNOSIS — J4550 Severe persistent asthma, uncomplicated: Principal | ICD-10-CM

## 2024-11-22 DIAGNOSIS — E119 Type 2 diabetes mellitus without complications: Principal | ICD-10-CM

## 2024-11-22 MED ORDER — ACCU-CHEK GUIDE TEST STRIPS
Freq: Once | 3 refills | 0.00000 days | Status: CP
Start: 2024-11-22 — End: 2024-11-22

## 2024-11-22 MED ORDER — MONTELUKAST 10 MG TABLET
ORAL_TABLET | Freq: Every evening | ORAL | 3 refills | 90.00000 days | Status: CP
Start: 2024-11-22 — End: ?

## 2024-11-22 MED ORDER — LANCETS
Freq: Every day | 3 refills | 100.00000 days | Status: CP
Start: 2024-11-22 — End: ?

## 2024-11-22 NOTE — Telephone Encounter (Signed)
 [facility]

## 2024-11-24 NOTE — Telephone Encounter (Signed)
 Spouse calling requesting update on assistance for [name] . Advised her that [name] had left them a message requesting a phone call back about this. [name] states she will call them back on Monday.

## 2024-11-27 NOTE — Telephone Encounter (Signed)
 WANTS TO KNOW IF WE HAVE SAMPLES OF JARDIANCE 

## 2024-11-27 NOTE — Telephone Encounter (Signed)
 We do not have samples.

## 2024-11-28 DIAGNOSIS — J479 Bronchiectasis, uncomplicated: Principal | ICD-10-CM

## 2024-11-28 MED ORDER — SODIUM CHLORIDE 7 % FOR NEBULIZATION
Freq: Two times a day (BID) | RESPIRATORY_TRACT | 3 refills | 90.00000 days | Status: CP
Start: 2024-11-28 — End: 2025-11-28

## 2024-11-28 NOTE — Progress Notes (Signed)
 [facility] Specialty and Home Delivery Pharmacy Refill Coordination Note    Specialty Medication(s) to be Shipped:   CF/Pulmonary/Asthma: [name]     Other medication(s) to be shipped: No additional medications requested for fill at this time    Specialty Medications not needed at this time: N/A     Brett Encinas Sr., DOB: [birth year]  Phone: There are no phone numbers on file.      All above HIPAA information was verified with patient's family member, wife.     Was a nurse, learning disability used for this call? No    Completed refill call assessment today to schedule patient's medication shipment from the [facility] and Home Delivery Pharmacy  [phone]).  All relevant notes have been reviewed.     Specialty medication(s) and dose(s) confirmed: Regimen is correct and unchanged.   Changes to medications: Brett Hanna reports no changes at this time.  Changes to insurance: No  New side effects reported not previously addressed with a pharmacist or physician: None reported  Questions for the pharmacist: No    Confirmed patient received a Conservation Officer, Historic Buildings and a Surveyor, Mining with first shipment. The patient will receive a drug information handout for each medication shipped and additional FDA Medication Guides as required.       DISEASE/MEDICATION-SPECIFIC INFORMATION        N/A    SPECIALTY MEDICATION ADHERENCE     Medication Adherence    Patient reported X missed doses in the last month: 0  Specialty Medication: [name]  25 mg tablet (brensocatib )  Support network for adherence: family member              Were doses missed due to medication being on hold? No    [name]  25 mg: 0 doses of medicine on hand       Specialty medication is an injection or given on a cycle: No    REFERRAL TO PHARMACIST     Referral to the pharmacist: Not needed      [facility]     Shipping address confirmed in Epic.     Cost and Payment: Patient has a $0 copay, payment information is not required.    Delivery Scheduled: Yes, Expected medication delivery date: 1/30.     Medication will be delivered via UPS to the prescription address in Epic WAM.    Therisa FORBES Ra, PharmD   [facility] Special[facility] and Home Delivery Pharmacy  Specialty Pharmacist

## 2024-11-29 NOTE — Telephone Encounter (Signed)
 Spoke to spouse who was reporting that [facility] stated they did not have the appropriate albuterol  neb solution order, and that they would need a new order.  I called [facility] & spoke to Caterina who verified that they do have the correct order for the albuterol  neb solution, and that the order is processing & should be shipped to pt soon.  Called spouse to update.

## 2024-12-01 MED FILL — BRINSUPRI 25 MG TABLET: ORAL | 30 days supply | Qty: 30 | Fill #3

## 2024-12-06 DIAGNOSIS — J479 Bronchiectasis, uncomplicated: Principal | ICD-10-CM

## 2024-12-08 NOTE — Telephone Encounter (Signed)
 Spoke to wife who was asking for update on PA. Advised her that insurance denied PA for 7% hypertonic saline. She is confused, as she was told earlier this week by [facility] they had a $0 copay. I'm not sure how else to advise her, so I've contacted our clinic [facility] for further investigation into medication access for this medication or similar alternatives.
